# Patient Record
Sex: Male | Born: 1960
Health system: Southern US, Community
[De-identification: ages and names within clinical notes are randomized; demographics above are authoritative.]

## PROBLEM LIST (undated history)

## (undated) ENCOUNTER — Emergency Department (HOSPITAL_COMMUNITY): Admission: EM | Discharge status: Absent without leave | Payer: PPO

## (undated) DIAGNOSIS — F209 Schizophrenia, unspecified: Secondary | ICD-10-CM

---

## 1997-11-24 ENCOUNTER — Emergency Department (HOSPITAL_COMMUNITY): Admission: EM | Admit: 1997-11-24 | Discharge: 1997-11-24 | Payer: Self-pay | Admitting: *Deleted

## 1997-12-08 ENCOUNTER — Inpatient Hospital Stay (HOSPITAL_COMMUNITY): Admission: AD | Admit: 1997-12-08 | Discharge: 1997-12-12 | Payer: Self-pay | Admitting: Specialist

## 1997-12-13 ENCOUNTER — Encounter (HOSPITAL_COMMUNITY): Admission: RE | Admit: 1997-12-13 | Discharge: 1998-03-13 | Payer: Self-pay | Admitting: Specialist

## 1998-07-18 ENCOUNTER — Inpatient Hospital Stay (HOSPITAL_COMMUNITY): Admission: AD | Admit: 1998-07-18 | Discharge: 1998-07-24 | Payer: Self-pay | Admitting: Psychiatry

## 1998-07-31 ENCOUNTER — Inpatient Hospital Stay (HOSPITAL_COMMUNITY): Admission: EM | Admit: 1998-07-31 | Discharge: 1998-08-05 | Payer: Self-pay | Admitting: Emergency Medicine

## 1998-08-04 ENCOUNTER — Encounter: Payer: Self-pay | Admitting: Specialist

## 1998-08-06 ENCOUNTER — Encounter (HOSPITAL_COMMUNITY): Admission: RE | Admit: 1998-08-06 | Discharge: 1998-11-04 | Payer: Self-pay | Admitting: Psychiatry

## 1998-08-10 ENCOUNTER — Emergency Department (HOSPITAL_COMMUNITY): Admission: EM | Admit: 1998-08-10 | Discharge: 1998-08-10 | Payer: Self-pay | Admitting: Emergency Medicine

## 2000-04-03 ENCOUNTER — Emergency Department (HOSPITAL_COMMUNITY): Admission: EM | Admit: 2000-04-03 | Discharge: 2000-04-03 | Payer: Self-pay | Admitting: Emergency Medicine

## 2003-02-01 ENCOUNTER — Ambulatory Visit (HOSPITAL_BASED_OUTPATIENT_CLINIC_OR_DEPARTMENT_OTHER): Admission: RE | Admit: 2003-02-01 | Discharge: 2003-02-01 | Payer: Self-pay | Admitting: Orthopedic Surgery

## 2007-11-24 ENCOUNTER — Emergency Department (HOSPITAL_COMMUNITY): Admission: EM | Admit: 2007-11-24 | Discharge: 2007-11-24 | Payer: Self-pay | Admitting: Emergency Medicine

## 2007-12-07 ENCOUNTER — Emergency Department (HOSPITAL_COMMUNITY): Admission: EM | Admit: 2007-12-07 | Discharge: 2007-12-07 | Payer: Self-pay | Admitting: Emergency Medicine

## 2008-11-13 ENCOUNTER — Ambulatory Visit: Payer: Self-pay | Admitting: Family Medicine

## 2008-11-13 DIAGNOSIS — M25579 Pain in unspecified ankle and joints of unspecified foot: Secondary | ICD-10-CM

## 2008-12-18 ENCOUNTER — Ambulatory Visit: Payer: Self-pay | Admitting: Family Medicine

## 2009-05-10 ENCOUNTER — Emergency Department (HOSPITAL_COMMUNITY): Admission: EM | Admit: 2009-05-10 | Discharge: 2009-05-16 | Payer: Self-pay | Admitting: Emergency Medicine

## 2010-07-13 ENCOUNTER — Emergency Department (HOSPITAL_COMMUNITY)
Admission: EM | Admit: 2010-07-13 | Discharge: 2010-07-14 | Disposition: A | Discharge status: Other Acute Inpatient Hospital | Payer: Self-pay | Source: Home / Self Care | Admitting: Emergency Medicine

## 2010-07-14 ENCOUNTER — Inpatient Hospital Stay (HOSPITAL_COMMUNITY)
Admission: AD | Admit: 2010-07-14 | Discharge: 2010-07-25 | Payer: Self-pay | Attending: Psychiatry | Admitting: Psychiatry

## 2010-07-29 LAB — VALPROIC ACID LEVEL: Valproic Acid Lvl: 88.6 ug/mL (ref 50.0–100.0)

## 2010-08-08 ENCOUNTER — Emergency Department (HOSPITAL_COMMUNITY)
Admission: EM | Admit: 2010-08-08 | Discharge: 2010-08-08 | Payer: Self-pay | Source: Home / Self Care | Admitting: Emergency Medicine

## 2010-09-09 ENCOUNTER — Ambulatory Visit: Payer: Self-pay | Admitting: Family Medicine

## 2010-09-23 LAB — RAPID URINE DRUG SCREEN, HOSP PERFORMED
Amphetamines: NOT DETECTED
Barbiturates: NOT DETECTED
Benzodiazepines: NOT DETECTED
Cocaine: NOT DETECTED
Opiates: NOT DETECTED
Tetrahydrocannabinol: NOT DETECTED

## 2010-09-23 LAB — CBC
HCT: 44.7 % (ref 39.0–52.0)
Hemoglobin: 15.7 g/dL (ref 13.0–17.0)
MCH: 31.8 pg (ref 26.0–34.0)
MCHC: 35.1 g/dL (ref 30.0–36.0)
MCV: 90.7 fL (ref 78.0–100.0)
Platelets: 272 10*3/uL (ref 150–400)
RBC: 4.93 MIL/uL (ref 4.22–5.81)
RDW: 13.4 % (ref 11.5–15.5)
WBC: 8.7 10*3/uL (ref 4.0–10.5)

## 2010-09-23 LAB — BASIC METABOLIC PANEL
BUN: 11 mg/dL (ref 6–23)
CO2: 25 mEq/L (ref 19–32)
Calcium: 9.4 mg/dL (ref 8.4–10.5)
Chloride: 104 mEq/L (ref 96–112)
Creatinine, Ser: 1.07 mg/dL (ref 0.4–1.5)
GFR calc Af Amer: >60 mL/min (ref >60)
GFR calc non Af Amer: >60 mL/min (ref >60)
Glucose, Bld: 118 mg/dL — ABNORMAL HIGH (ref 70–99)
Potassium: 4.1 mEq/L (ref 3.5–5.1)
Sodium: 139 mEq/L (ref 135–145)

## 2010-09-23 LAB — DIFFERENTIAL
Basophils Absolute: 0 10*3/uL (ref 0.0–0.1)
Basophils Relative: 0 % (ref 0–1)
Eosinophils Absolute: 0 10*3/uL (ref 0.0–0.7)
Eosinophils Relative: 1 % (ref 0–5)
Lymphocytes Relative: 32 % (ref 12–46)
Lymphs Abs: 2.8 10*3/uL (ref 0.7–4.0)
Monocytes Absolute: 0.7 10*3/uL (ref 0.1–1.0)
Monocytes Relative: 9 % (ref 3–12)
Neutro Abs: 5.2 10*3/uL (ref 1.7–7.7)
Neutrophils Relative %: 59 % (ref 43–77)

## 2010-09-23 LAB — HEPATIC FUNCTION PANEL
ALT: 35 U/L (ref 0–53)
AST: 22 U/L (ref 0–37)
Albumin: 3.7 g/dL (ref 3.5–5.2)
Alkaline Phosphatase: 60 U/L (ref 39–117)
Bilirubin, Direct: <0.1 mg/dL (ref 0.0–0.3)
Total Bilirubin: 0.5 mg/dL (ref 0.3–1.2)
Total Protein: 6.8 g/dL (ref 6.0–8.3)

## 2010-09-23 LAB — TSH: TSH: 6.293 u[IU]/mL — ABNORMAL HIGH (ref 0.350–4.500)

## 2010-09-23 LAB — ETHANOL: Alcohol, Ethyl (B): <5 mg/dL (ref 0–10)

## 2010-10-17 LAB — COMPREHENSIVE METABOLIC PANEL
ALT: 22 U/L (ref 0–53)
AST: 28 U/L (ref 0–37)
Albumin: 3.6 g/dL (ref 3.5–5.2)
Alkaline Phosphatase: 52 U/L (ref 39–117)
BUN: 14 mg/dL (ref 6–23)
CO2: 25 mEq/L (ref 19–32)
Calcium: 9.1 mg/dL (ref 8.4–10.5)
Chloride: 105 mEq/L (ref 96–112)
Creatinine, Ser: 0.99 mg/dL (ref 0.4–1.5)
GFR calc Af Amer: >60 mL/min (ref >60)
GFR calc non Af Amer: >60 mL/min (ref >60)
Glucose, Bld: 125 mg/dL — ABNORMAL HIGH (ref 70–99)
Potassium: 3.7 mEq/L (ref 3.5–5.1)
Sodium: 140 mEq/L (ref 135–145)
Total Bilirubin: 0.9 mg/dL (ref 0.3–1.2)
Total Protein: 6.3 g/dL (ref 6.0–8.3)

## 2010-10-17 LAB — URINALYSIS, ROUTINE W REFLEX MICROSCOPIC
Bilirubin Urine: NEGATIVE
Glucose, UA: NEGATIVE mg/dL
Hgb urine dipstick: NEGATIVE
Ketones, ur: 15 mg/dL — AB
Nitrite: NEGATIVE
Protein, ur: NEGATIVE mg/dL
Specific Gravity, Urine: 1.023 (ref 1.005–1.030)
Urobilinogen, UA: 1 mg/dL (ref 0.0–1.0)
pH: 6.5 (ref 5.0–8.0)

## 2010-10-17 LAB — LIPASE, BLOOD: Lipase: 15 U/L (ref 11–59)

## 2010-10-17 LAB — TYPE AND SCREEN
ABO/RH(D): O POS
Antibody Screen: NEGATIVE

## 2010-10-17 LAB — DIFFERENTIAL
Basophils Absolute: 0 10*3/uL (ref 0.0–0.1)
Basophils Relative: 0 % (ref 0–1)
Eosinophils Absolute: 0 10*3/uL (ref 0.0–0.7)
Eosinophils Relative: 0 % (ref 0–5)
Lymphocytes Relative: 27 % (ref 12–46)
Lymphs Abs: 3.1 10*3/uL (ref 0.7–4.0)
Monocytes Absolute: 0.8 10*3/uL (ref 0.1–1.0)
Monocytes Relative: 7 % (ref 3–12)
Neutro Abs: 7.4 10*3/uL (ref 1.7–7.7)
Neutrophils Relative %: 66 % (ref 43–77)

## 2010-10-17 LAB — ETHANOL: Alcohol, Ethyl (B): <5 mg/dL (ref 0–10)

## 2010-10-17 LAB — SALICYLATE LEVEL: Salicylate Lvl: <4 mg/dL (ref 2.8–20.0)

## 2010-10-17 LAB — RAPID URINE DRUG SCREEN, HOSP PERFORMED
Amphetamines: NOT DETECTED
Barbiturates: NOT DETECTED
Opiates: NOT DETECTED

## 2010-10-17 LAB — CBC
HCT: 43.2 % (ref 39.0–52.0)
Hemoglobin: 14.6 g/dL (ref 13.0–17.0)
MCHC: 33.8 g/dL (ref 30.0–36.0)
MCV: 94.8 fL (ref 78.0–100.0)
Platelets: 222 10*3/uL (ref 150–400)
RBC: 4.56 MIL/uL (ref 4.22–5.81)
RDW: 13 % (ref 11.5–15.5)
WBC: 11.3 10*3/uL — ABNORMAL HIGH (ref 4.0–10.5)

## 2010-10-17 LAB — ABO/RH: ABO/RH(D): O POS

## 2010-10-17 LAB — ACETAMINOPHEN LEVEL: Acetaminophen (Tylenol), Serum: <10 ug/mL — ABNORMAL LOW (ref 10–30)

## 2010-11-29 NOTE — Op Note (Signed)
   NAME:  Anthony Knox, Anthony Knox                           ACCOUNT NO.:  [id number]   MEDICAL RECORD NO.:  [id number]                   PATIENT TYPE:  AMB   LOCATION:  DSC                                  FACILITY:  [facility]   PHYSICIAN:  Cindee Salt, M.D.                    DATE OF BIRTH:  [birth year]   DATE OF PROCEDURE:  02/01/2003  DATE OF DISCHARGE:                                 OPERATIVE REPORT   PREOPERATIVE DIAGNOSIS:  Stenosing tenosynovitis, right middle finger.   POSTOPERATIVE DIAGNOSIS:  Stenosing tenosynovitis, right middle finger.   OPERATION:  Release of A1 pulley, right middle finger.   SURGEON:  Cindee Salt, M.D.   ASSISTANTCarolyne Fiscal.   ANESTHESIA:  Forearm based IV regional.   HISTORY:  The patient is a 50 year old male with a history of triggering of  his right middle finger which has not responded to conservative treatment.   DESCRIPTION OF PROCEDURE:  The patient was brought to the operating room.  A  forearm based IV regional anesthetic was carried out without difficulty.  He  was prepped and draped using Duraprep in the supine position with the right  arm free.  An oblique incision was made over the A1 pulley and carried  through the subcutaneous tissue.  The neurovascular structures were  identified and protected.  The A1 pulley was released on its radial aspect.  The A2 pulley was incised in the central aspect for approximately 2-3 mm.  The finger was placed through a full range of motion.  No further triggering  was identified.  The wound was irrigated.  The skin was closed with  interrupted 5-0 nylon sutures.  A sterile compressive dressing was applied.  The patient tolerated the procedure well and was taken to the recovery room  for observation in satisfactory condition.  He is discharged home to return  to the [facility] [place] in one week on Vicodin and Keflex.                                               Cindee Salt, M.D.    GK/MEDQ  D:  02/01/2003   T:  02/01/2003  Job:  [id number]

## 2011-04-09 LAB — POCT I-STAT, CHEM 8
Chloride: 104
Creatinine, Ser: 1
Glucose, Bld: 114 — ABNORMAL HIGH
Hemoglobin: 16
Potassium: 3.4 — ABNORMAL LOW
Sodium: 139

## 2011-04-09 LAB — CBC
HCT: 45.5
Hemoglobin: 15.7
MCV: 89.4
Platelets: 290
RBC: 5.09
WBC: 6

## 2011-04-09 LAB — URINALYSIS, ROUTINE W REFLEX MICROSCOPIC
Glucose, UA: NEGATIVE
Hgb urine dipstick: NEGATIVE
Specific Gravity, Urine: 1.034 — ABNORMAL HIGH
pH: 6.5

## 2011-04-09 LAB — URINE MICROSCOPIC-ADD ON

## 2011-04-09 LAB — RAPID URINE DRUG SCREEN, HOSP PERFORMED: Barbiturates: NOT DETECTED

## 2011-07-25 IMAGING — CR DG CHEST 2V
2 series · 2 of 2 positions shown · non-contrast
Comparison: [DATE] and nine CT.

CLINICAL DATA: 49-year-old male with shortness of breath, cough,
fever, right chest pain.

CHEST - 2 VIEW

[w chest pa]
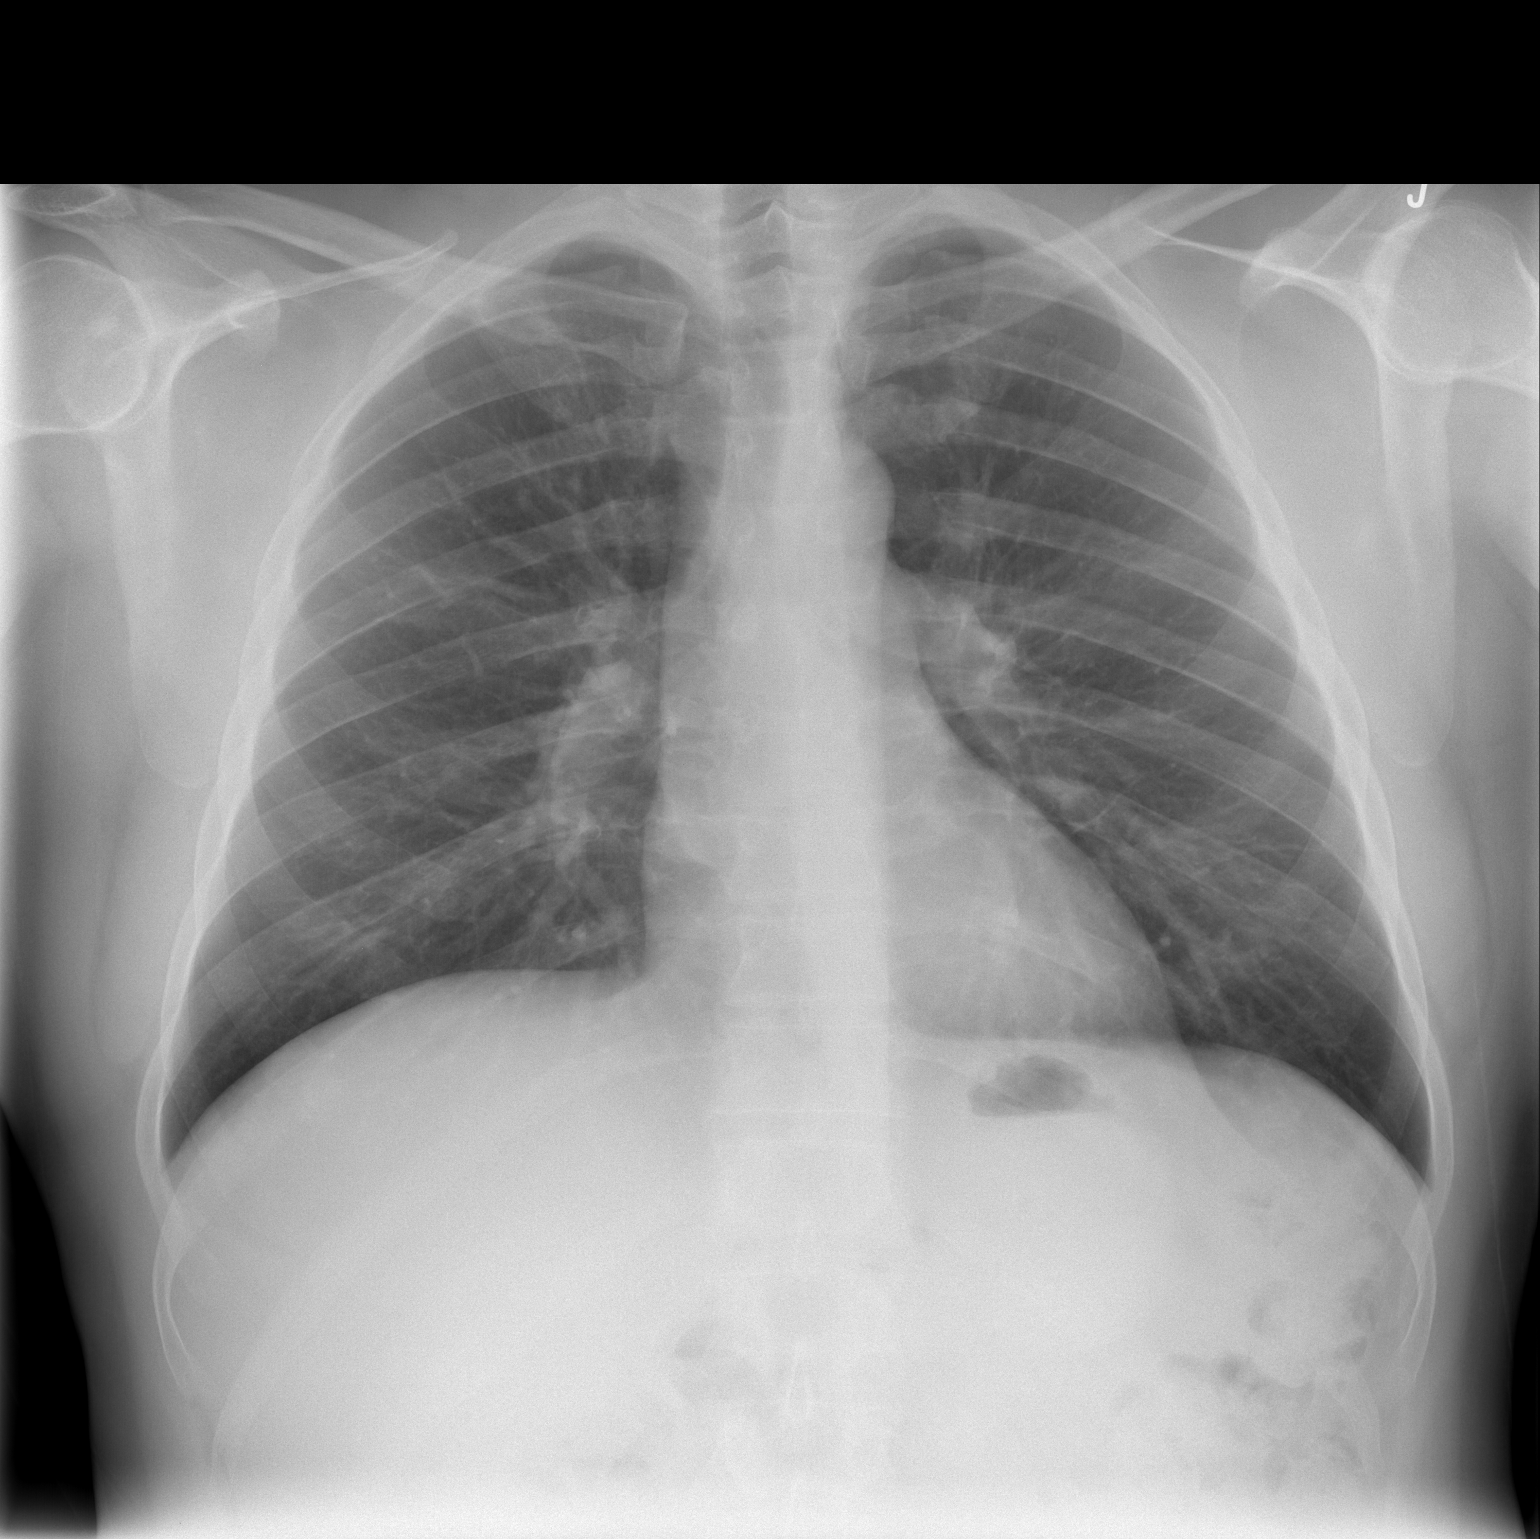

[w chest lat]
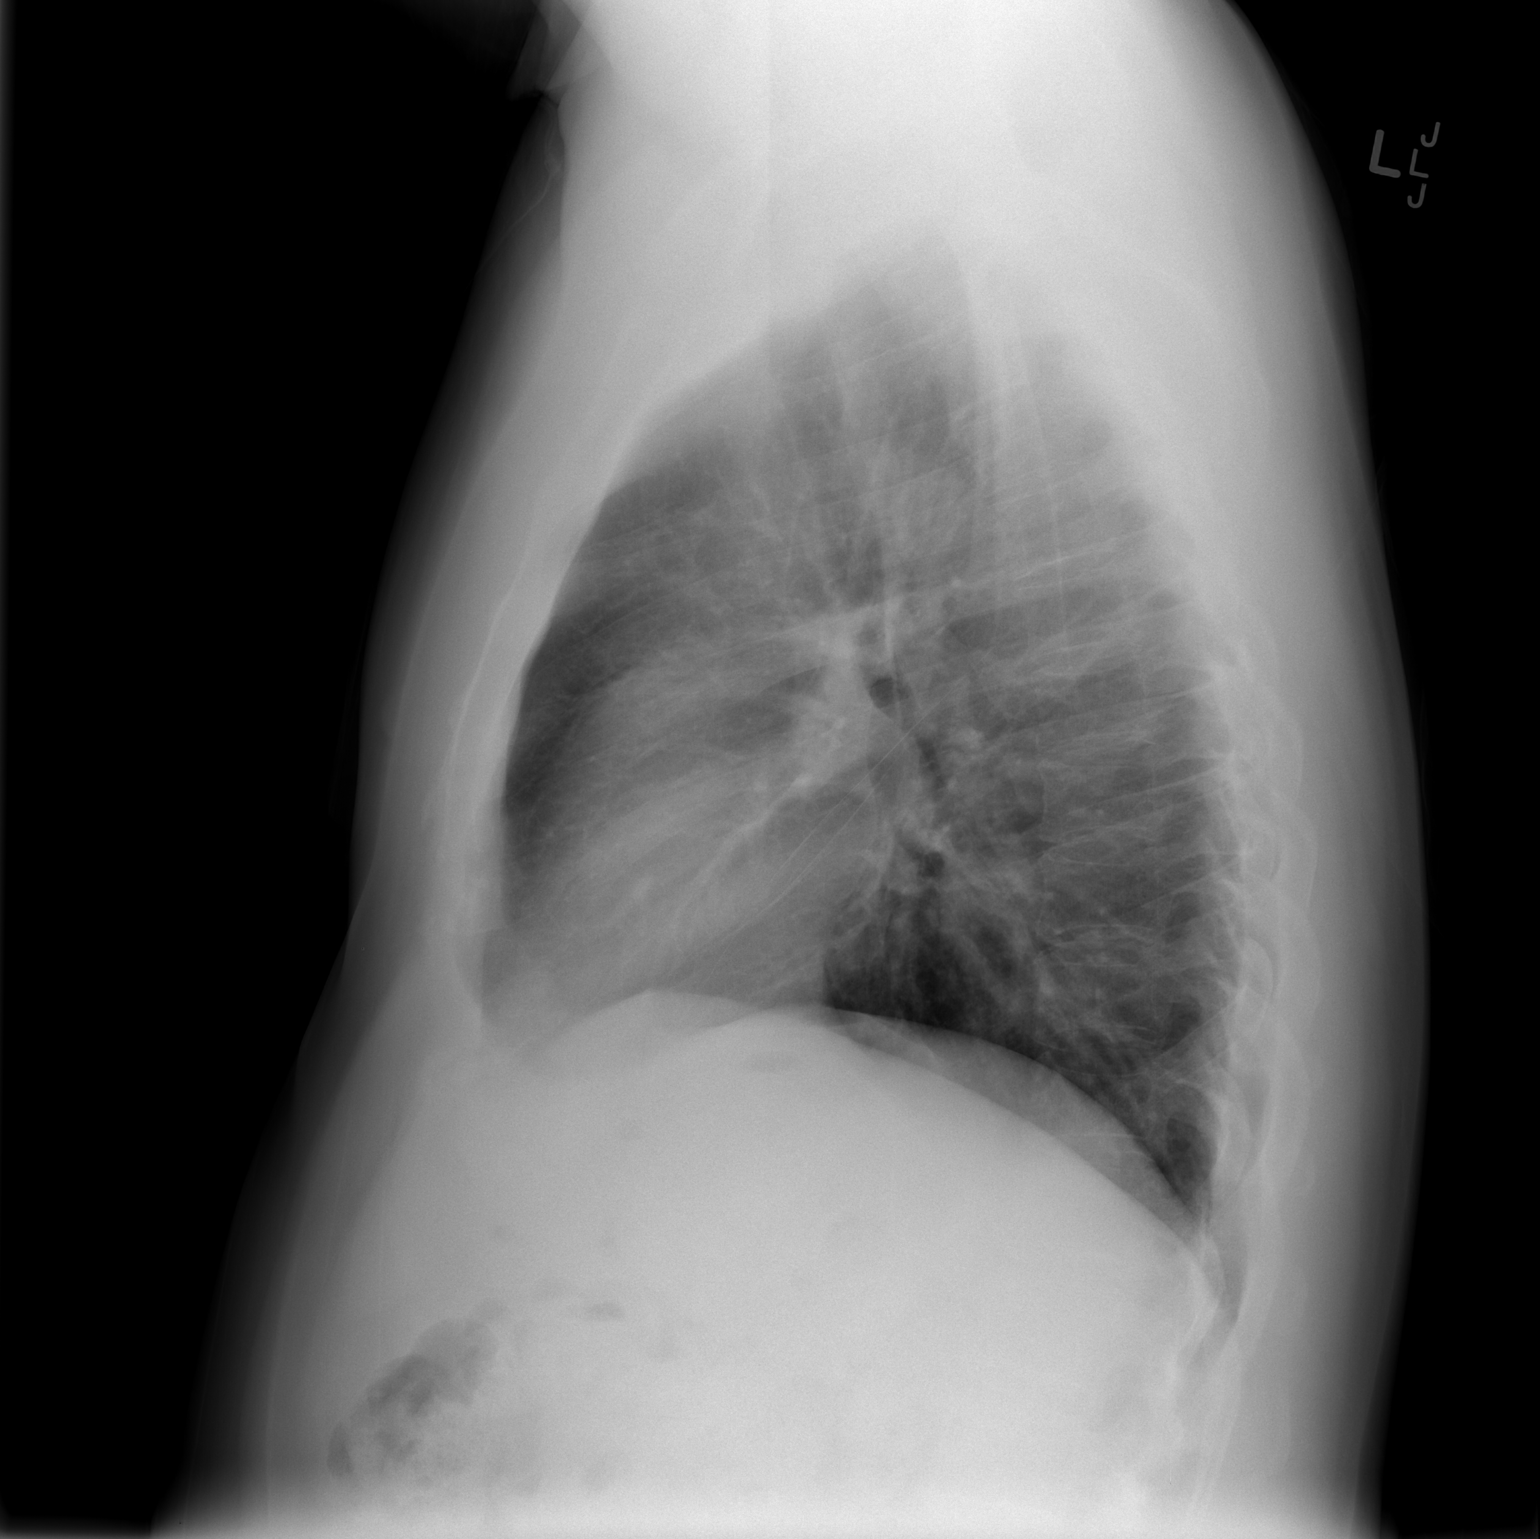

[2 of 2 positions shown; findings below may reference images not displayed]

FINDINGS: Normal lung volumes. Normal cardiac size and mediastinal
contours.  Visualized tracheal air column is within normal limits.
The lungs are clear. No acute osseous abnormality identified.
IMPRESSION: Negative, no acute cardiopulmonary abnormality.

## 2018-05-05 ENCOUNTER — Encounter (HOSPITAL_COMMUNITY): Payer: Self-pay | Admitting: Registered Nurse

## 2018-05-05 ENCOUNTER — Emergency Department (HOSPITAL_COMMUNITY)
Admission: EM | Admit: 2018-05-05 | Discharge: 2018-05-07 | Disposition: A | Discharge status: Other Acute Inpatient Hospital | Payer: Medicare Other | Attending: Emergency Medicine | Admitting: Emergency Medicine

## 2018-05-05 ENCOUNTER — Emergency Department (HOSPITAL_COMMUNITY): Payer: Medicare Other

## 2018-05-05 ENCOUNTER — Other Ambulatory Visit: Payer: Self-pay

## 2018-05-05 DIAGNOSIS — R410 Disorientation, unspecified: Secondary | ICD-10-CM

## 2018-05-05 DIAGNOSIS — W19XXXA Unspecified fall, initial encounter: Secondary | ICD-10-CM | POA: Insufficient documentation

## 2018-05-05 DIAGNOSIS — F29 Unspecified psychosis not due to a substance or known physiological condition: Secondary | ICD-10-CM

## 2018-05-05 DIAGNOSIS — F259 Schizoaffective disorder, unspecified: Secondary | ICD-10-CM | POA: Diagnosis not present

## 2018-05-05 DIAGNOSIS — S0101XA Laceration without foreign body of scalp, initial encounter: Secondary | ICD-10-CM

## 2018-05-05 DIAGNOSIS — Y999 Unspecified external cause status: Secondary | ICD-10-CM | POA: Insufficient documentation

## 2018-05-05 DIAGNOSIS — Z23 Encounter for immunization: Secondary | ICD-10-CM | POA: Insufficient documentation

## 2018-05-05 DIAGNOSIS — Y9389 Activity, other specified: Secondary | ICD-10-CM | POA: Insufficient documentation

## 2018-05-05 DIAGNOSIS — F251 Schizoaffective disorder, depressive type: Secondary | ICD-10-CM | POA: Insufficient documentation

## 2018-05-05 DIAGNOSIS — Y9289 Other specified places as the place of occurrence of the external cause: Secondary | ICD-10-CM | POA: Insufficient documentation

## 2018-05-05 LAB — COMPREHENSIVE METABOLIC PANEL
ALT: 37 U/L (ref 0–44)
ANION GAP: 4 — AB (ref 5–15)
AST: 45 U/L — ABNORMAL HIGH (ref 15–41)
Albumin: 3.7 g/dL (ref 3.5–5.0)
Alkaline Phosphatase: 49 U/L (ref 38–126)
BILIRUBIN TOTAL: 0.8 mg/dL (ref 0.3–1.2)
BUN: 8 mg/dL (ref 6–20)
CALCIUM: 8.5 mg/dL — AB (ref 8.9–10.3)
CO2: 25 mmol/L (ref 22–32)
Chloride: 109 mmol/L (ref 98–111)
Creatinine, Ser: 0.95 mg/dL (ref 0.61–1.24)
Glucose, Bld: 104 mg/dL — ABNORMAL HIGH (ref 70–99)
Potassium: 3.8 mmol/L (ref 3.5–5.1)
Sodium: 138 mmol/L (ref 135–145)
TOTAL PROTEIN: 6 g/dL — AB (ref 6.5–8.1)

## 2018-05-05 LAB — CBC
HCT: 45.5 % (ref 39.0–52.0)
Hemoglobin: 15 g/dL (ref 13.0–17.0)
MCH: 29.5 pg (ref 26.0–34.0)
MCHC: 33 g/dL (ref 30.0–36.0)
MCV: 89.6 fL (ref 80.0–100.0)
NRBC: 0 % (ref 0.0–0.2)
PLATELETS: 279 10*3/uL (ref 150–400)
RBC: 5.08 MIL/uL (ref 4.22–5.81)
RDW: 13.6 % (ref 11.5–15.5)
WBC: 8 10*3/uL (ref 4.0–10.5)

## 2018-05-05 LAB — ETHANOL

## 2018-05-05 LAB — SALICYLATE LEVEL: Salicylate Lvl: <7 mg/dL (ref 2.8–30.0)

## 2018-05-05 LAB — ACETAMINOPHEN LEVEL

## 2018-05-05 MED ORDER — TETANUS-DIPHTH-ACELL PERTUSSIS 5-2.5-18.5 LF-MCG/0.5 IM SUSP
0.5000 mL | Freq: Once | INTRAMUSCULAR | Status: AC
  Administered 2018-05-05: 0.5 mL via INTRAMUSCULAR
  Filled 2018-05-05: qty 0.5

## 2018-05-05 MED ORDER — ZIPRASIDONE MESYLATE 20 MG IM SOLR: 20.0000 mg | INTRAMUSCULAR | Status: DC | PRN

## 2018-05-05 MED ORDER — RISPERIDONE 2 MG PO TBDP
2.0000 mg | ORAL_TABLET | Freq: Three times a day (TID) | ORAL | Status: DC | PRN
  Filled 2018-05-05: qty 1

## 2018-05-05 MED ORDER — LIDOCAINE-EPINEPHRINE (PF) 2 %-1:200000 IJ SOLN
10.0000 mL | Freq: Once | INTRAMUSCULAR | Status: DC
  Filled 2018-05-05: qty 20

## 2018-05-05 MED ORDER — LIDOCAINE-EPINEPHRINE-TETRACAINE (LET) SOLUTION
3.0000 mL | Freq: Once | NASAL | Status: AC
  Administered 2018-05-05: 3 mL via TOPICAL
  Filled 2018-05-05: qty 3

## 2018-05-05 MED ORDER — LORAZEPAM 1 MG PO TABS: 1.0000 mg | ORAL_TABLET | ORAL | Status: DC | PRN
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
## 2018-05-05 NOTE — ED Notes (Signed)
Patient given a urinal and asked to give urine sample; pt put water in urinal and brought to RN; when asked about it patient states "I discharged it" and laughed out loud; Patient ambulated back to room and return to bed-Monique,RN

## 2018-05-05 NOTE — ED Notes (Signed)
Pt wanded by security at 15:12.

## 2018-05-05 NOTE — ED Notes (Addendum)
Pt was found wandering in supply room with the lights turned off. Pt easily redirected back to hallway bed, calm and cooperative. RN aware.

## 2018-05-05 NOTE — ED Notes (Signed)
Pt car is at [employer], [employer] by [employer]

## 2018-05-05 NOTE — ED Notes (Signed)
Pt changed into purple scrubs 
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
## 2018-05-05 NOTE — ED Notes (Signed)
Pt belongings in locker #1. 

## 2018-05-06 ENCOUNTER — Encounter (HOSPITAL_COMMUNITY): Payer: Self-pay | Admitting: Registered Nurse

## 2018-05-06 DIAGNOSIS — R410 Disorientation, unspecified: Secondary | ICD-10-CM | POA: Insufficient documentation

## 2018-05-06 DIAGNOSIS — S0101XA Laceration without foreign body of scalp, initial encounter: Secondary | ICD-10-CM | POA: Insufficient documentation

## 2018-05-06 DIAGNOSIS — F29 Unspecified psychosis not due to a substance or known physiological condition: Secondary | ICD-10-CM

## 2018-05-06 DIAGNOSIS — F203 Undifferentiated schizophrenia: Secondary | ICD-10-CM

## 2018-05-06 LAB — RAPID URINE DRUG SCREEN, HOSP PERFORMED
Amphetamines: NOT DETECTED
BARBITURATES: NOT DETECTED
Benzodiazepines: NOT DETECTED
COCAINE: NOT DETECTED
Opiates: NOT DETECTED
TETRAHYDROCANNABINOL: NOT DETECTED

## 2018-05-06 MED ORDER — OLANZAPINE 5 MG PO TBDP
5.0000 mg | ORAL_TABLET | Freq: Two times a day (BID) | ORAL | Status: DC
  Administered 2018-05-06 – 2018-05-07 (×3): 5 mg via ORAL
  Filled 2018-05-06 (×3): qty 1

## 2018-05-06 MED ORDER — LORAZEPAM 1 MG PO TABS
1.0000 mg | ORAL_TABLET | Freq: Once | ORAL | Status: AC | PRN
  Administered 2018-05-06: 1 mg via ORAL
  Filled 2018-05-06: qty 1

## 2018-05-06 MED ORDER — ZIPRASIDONE MESYLATE 20 MG IM SOLR: 20.0000 mg | Freq: Once | INTRAMUSCULAR | Status: DC | PRN

## 2018-05-06 NOTE — ED Notes (Signed)
Pt up stating he needs to get dressed. Pt verbally redirected- used restroom- now back in room.

## 2018-05-06 NOTE — ED Notes (Signed)
Patient at the RN station attempting to call his brother; patient could not state correct number to call family; Patient remains confused at this time-Monique,RN

## 2018-05-06 NOTE — Progress Notes (Signed)
Pt accepted to [facility] Bed 505-2 for admission on 05/07/18 when bed is available. Nelly Rout, MD, is the accepting provider.  Jolyne Loa, MD. is the attending provider.  Call report to [phone]  [email or web address] ED notified.   Pt is TO BE IVC'D as he lacks capacity to sign in Voluntarily due to his psychosis  Pt may be transported by [facility] Pt scheduled  to arrive at [facility] WHEN AC CALLS TO NOTIFY THAT BED IS AVAILABLE.  Timmothy Euler. Kaylyn Lim, MSW, LCSWA Disposition Clinical Social Work [phone] (cell) [phone] (office)

## 2018-05-06 NOTE — ED Notes (Addendum)
EDP at bedside and IVC paperwork completed and faxed.

## 2018-05-06 NOTE — ED Notes (Signed)
Patient has been standing at desk talking to sitter and RN; RN has been have general conversation with patient about his family; patient does not appear to know anything about his life except he is able to speak about his father Anthony Knox and  His brother Chapman Moss. 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 Patient walked freely back to the unit; Patient in his room calm at this time and asked to take a shower; patient was helped to be able to take shower-Monique,RN

## 2018-05-06 NOTE — ED Notes (Signed)
Dinner tray ordered for pt

## 2018-05-06 NOTE — ED Notes (Signed)
Patient has come out the room on several occasions throughout the night with random thoughts and conversations; patient most recent states he thinks he had an epiphany that he has cancer; pt requested to have MD order test so he would not get billed twice; patient is still very much disoriented but able to be redirected back to his room-Monique,RN

## 2018-05-06 NOTE — ED Notes (Signed)
RN asked pt his name. He states "scumbag" and laughed. Rn asked if he knows where he is. He states "I am in a hospital and I am just a number. I am trying to use reverse psychology to get you to laugh... But I really do feel bad." He did a few laps up and down hall of unit. Sprite given.

## 2018-05-06 NOTE — ED Notes (Signed)
Lunch tray ordered 

## 2018-05-06 NOTE — Consult Note (Signed)
Telepsych Consultation   Reason for Consult:  Altered mental status Referring Physician:  Ollen Gross Location of Patient: [facility] Location of Provider: [facility]  Patient Identification: Anthony Knox MRN:  [id number] Principal Diagnosis: Schizophrenia [facility]) Diagnosis:   Patient Active Problem List   Diagnosis Date Noted  . Schizophrenia ([place]) [F20.9] 05/06/2018  . ANKLE PAIN [M25.579] 11/13/2008    Total Time spent with patient: 30 minutes  Subjective:   Anthony Knox is a 57 y.o. male patient presented to [facility] via EMS after he was found in [place] parking lot sitting in his car disoriented with a cut on head.  "Thinking he may have fallen at home.   HPI:  Anthony Knox, 57 y.o., male patient seen via telepsych by this provider and Dr. Dwyane Dee; chart reviewed on 05/06/18.  On evaluation Anthony Knox reports that his name is Anthony Knox and he was born on [birth year]; to get his age "move the decimal 2 places to the left"  Patient reports that he woke up yesterday "in a pool of blood; felt like I had been shot in the head.  Got up and got dressed was going to one of those 24 hour emergency places.  I went to the wrong address.  I started to feel really dizzy."  Patient states that he lives with his mother "Wells Guiles" but he doesn't know her phone number but states they live [address].  When asked about auditory or visual hallucination patient responded "I don't know what other people see.  I am ashamed and really shy.  I don't like talking to people about certain things.  They make you feel weird and ashamed; then want to chain you to a bed"  Patient states that he has seen a psychiatrist in the past "he was broken and was a nut; he didn't even know what logic was.  Patient denies suicidal/self-harm/homicidal ideation, and paranoia.  Patient did make statement about seeing thing come out of the ground and shadows but would not elaborated further.   Past  Psychiatric History: Previous diagnosis of schizophrenia 2012 with outpatient services at [facility]; was taking psychotropic medications at that time; patient unable to give time line of when stopped medications and last outpatient psychiatric visit.    Risk to Self: Suicidal Ideation: No Suicidal Intent: No Is patient at risk for suicide?: No Suicidal Plan?: No Access to Means: No What has been your use of drugs/alcohol within the last 12 months?: none How many times?: 0 Other Self Harm Risks: none Triggers for Past Attempts: None known Intentional Self Injurious Behavior: None Risk to Others: Homicidal Ideation: No Thoughts of Harm to Others: No Current Homicidal Intent: No Current Homicidal Plan: No Access to Homicidal Means: No Identified Victim: none History of harm to others?: No Assessment of Violence: None Noted Violent Behavior Description: none Does patient have access to weapons?: No Criminal Charges Pending?: No Does patient have a court date: No Prior Inpatient Therapy: Prior Inpatient Therapy: Yes Prior Therapy Dates: 2012 Prior Therapy Facilty/Provider(s): [facility] Reason for Treatment: scizoaffective disorder Prior Outpatient Therapy: Prior Outpatient Therapy: Yes Prior Therapy Dates: 2015 Prior Therapy Facilty/Provider(s): [facility] Reason for Treatment: schizoaffective disorder Does patient have an ACCT team?: No Does patient have Intensive In-House Services?  : No Does patient have [facility] services? : No Does patient have P4CC services?: No  Past Medical History: History reviewed. No pertinent past medical history. History reviewed. No pertinent surgical  history. Family History: History reviewed. No pertinent family history. Family Psychiatric  History: Unaware Social History:  Social History   Substance and Sexual Activity  Alcohol Use Not on file     Social History   Substance and Sexual Activity  Drug Use Not on file    Social History    Socioeconomic History  . Marital status: Single    Spouse name: Not on file  . Number of children: Not on file  . Years of education: Not on file  . Highest education level: Not on file  Occupational History  . Not on file  Social Needs  . Financial resource strain: Not on file  . Food insecurity:    Worry: Not on file    Inability: Not on file  . Transportation needs:    Medical: Not on file    Non-medical: Not on file  Tobacco Use  . Smoking status: Not on file  Substance and Sexual Activity  . Alcohol use: Not on file  . Drug use: Not on file  . Sexual activity: Not on file  Lifestyle  . Physical activity:    Days per week: Not on file    Minutes per session: Not on file  . Stress: Not on file  Relationships  . Social connections:    Talks on phone: Not on file    Gets together: Not on file    Attends religious service: Not on file    Active member of club or organization: Not on file    Attends meetings of clubs or organizations: Not on file    Relationship status: Not on file  Other Topics Concern  . Not on file  Social History Narrative  . Not on file   Additional Social History:    Allergies:  No Known Allergies  Labs:  Results for orders placed or performed during the hospital encounter of 05/05/18 (from the past 48 hour(s))  Urine rapid drug screen (hosp performed)     Status: None   Collection Time: 05/05/18 10:47 AM  Result Value Ref Range   Opiates NONE DETECTED NONE DETECTED   Cocaine NONE DETECTED NONE DETECTED   Benzodiazepines NONE DETECTED NONE DETECTED   Amphetamines NONE DETECTED NONE DETECTED   Tetrahydrocannabinol NONE DETECTED NONE DETECTED   Barbiturates NONE DETECTED NONE DETECTED    Comment: (NOTE) DRUG SCREEN FOR MEDICAL PURPOSES ONLY.  IF CONFIRMATION IS NEEDED FOR ANY PURPOSE, NOTIFY LAB WITHIN 5 DAYS. LOWEST DETECTABLE LIMITS FOR URINE DRUG SCREEN Drug Class                     Cutoff (ng/mL) Amphetamine and metabolites     1000 Barbiturate and metabolites    200 Benzodiazepine                 795 Tricyclics and metabolites     300 Opiates and metabolites        300 Cocaine and metabolites        300 THC                            50 Performed at [facility], [place] [address] [place], [place] [address]   Comprehensive metabolic panel     Status: Abnormal   Collection Time: 05/05/18 12:00 PM  Result Value Ref Range   Sodium 138 135 - 145 mmol/L   Potassium 3.8 3.5 - 5.1 mmol/L  Chloride 109 98 - 111 mmol/L   CO2 25 22 - 32 mmol/L   Glucose, Bld 104 (H) 70 - 99 mg/dL   BUN 8 6 - 20 mg/dL   Creatinine, Ser 0.95 0.61 - 1.24 mg/dL   Calcium 8.5 (L) 8.9 - 10.3 mg/dL   Total Protein 6.0 (L) 6.5 - 8.1 g/dL   Albumin 3.7 3.5 - 5.0 g/dL   AST 45 (H) 15 - 41 U/L   ALT 37 0 - 44 U/L   Alkaline Phosphatase 49 38 - 126 U/L   Total Bilirubin 0.8 0.3 - 1.2 mg/dL   GFR calc non Af Amer >60 >60 mL/min   GFR calc Af Amer >60 >60 mL/min    Comment: (NOTE) The eGFR has been calculated using the CKD EPI equation. This calculation has not been validated in all clinical situations. eGFR's persistently <60 mL/min signify possible Chronic Kidney Disease.    Anion gap 4 (L) 5 - 15    Comment: Performed at [place] [address], [place] [address]  CBC     Status: None   Collection Time: 05/05/18 12:00 PM  Result Value Ref Range   WBC 8.0 4.0 - 10.5 K/uL   RBC 5.08 4.22 - 5.81 MIL/uL   Hemoglobin 15.0 13.0 - 17.0 g/dL   HCT 45.5 39.0 - 52.0 %   MCV 89.6 80.0 - 100.0 fL   MCH 29.5 26.0 - 34.0 pg   MCHC 33.0 30.0 - 36.0 g/dL   RDW 13.6 11.5 - 15.5 %   Platelets 279 150 - 400 K/uL   nRBC 0.0 0.0 - 0.2 %    Comment: Performed at [facility], [place] [address], [place], [place] [address]  Ethanol     Status: None   Collection Time: 05/05/18 12:00 PM  Result Value Ref Range   Alcohol, Ethyl (B) <10 <10 mg/dL    Comment: (NOTE) Lowest detectable limit for serum alcohol is 10  mg/dL. For medical purposes only. Performed at [facility], [place] [address] [place], [place] [address]   Salicylate level     Status: None   Collection Time: 05/05/18 12:00 PM  Result Value Ref Range   Salicylate Lvl <1.0 2.8 - 30.0 mg/dL    Comment: Performed at [place] [address] [place], [place] [address]  Acetaminophen level     Status: Abnormal   Collection Time: 05/05/18 12:00 PM  Result Value Ref Range   Acetaminophen (Tylenol), Serum <10 (L) 10 - 30 ug/mL    Comment: (NOTE) Therapeutic concentrations vary significantly. A range of 10-30 ug/mL  may be an effective concentration for many patients. However, some  are best treated at concentrations outside of this range. Acetaminophen concentrations >150 ug/mL at 4 hours after ingestion  and >50 ug/mL at 12 hours after ingestion are often associated with  toxic reactions. Performed at [facility], [place] [address], [place], [place] [address]     Medications:  Current Facility-Administered Medications  Medication Dose Route Frequency Provider Last Rate Last Dose  . lidocaine-EPINEPHrine (XYLOCAINE W/EPI) 2 %-1:200000 (PF) injection 10 mL  10 mL Infiltration Once Lorin Glass, PA-C      . ziprasidone (GEODON) injection 20 mg  20 mg Intramuscular Once PRN Rankin, Shuvon B, NP       And  . LORazepam (ATIVAN) tablet 1 mg  1 mg Oral Once PRN Rankin, Shuvon B, NP      .  OLANZapine zydis (ZYPREXA) disintegrating tablet 5 mg  5 mg Oral BID Rankin, Shuvon B, NP       No current outpatient medications on file.    Musculoskeletal: Strength & Muscle Tone: within normal limits Gait & Station: normal Patient leans: N/A  Psychiatric Specialty Exam: Physical Exam  ROS  Blood pressure 138/85, pulse 78, temperature 98.5 F (36.9 C), temperature source Oral, resp. rate 19, height 5' 7" (1.702 m), weight 81.6 kg, SpO2 95 %.Body mass index is 28.19 kg/m.  General Appearance: Casual  Eye Contact:   Good  Speech:  Clear and Coherent and Normal Rate  Volume:  Normal  Mood:  appropriate  Affect:  Congruent  Thought Process:  Disorganized  Orientation:  Other:  to place  Thought Content:  Delusions, Hallucinations: Visual and Rumination  Suicidal Thoughts:  No  Homicidal Thoughts:  No  Memory:  Immediate;   Poor Recent;   Poor Remote;   Poor  Judgement:  Impaired  Insight:  Lacking  Psychomotor Activity:  Normal  Concentration:  Concentration: Fair and Attention Span: Fair  Recall:  Poor  Fund of Knowledge:  Fair  Language:  Good  Akathisia:  No  Handed:  Right  AIMS (if indicated):     Assets:  Communication Skills Housing  ADL's:  Intact  Cognition:  WNL  Sleep:        Treatment Plan Summary: Daily contact with patient to assess and evaluate symptoms and progress in treatment, Medication management and Plan Inpatient psychiatric treatment   Recommendation: Start Zyprexa 5 mg Bid; Change current agitation order of Geodon/Ativan/Risperdal to Geodon/Ativan prn agitation. EKG related to on medication that can cause QT prolongation.  Orders entered   Disposition: Recommend psychiatric Inpatient admission when medically cleared.  This service was provided via telemedicine using a 2-way, interactive audio and video technology.  Names of all persons participating in this telemedicine service and their role in this encounter. Name: Earleen Newport, NP Role: Tele psych assessment  Name: Dr. Dwyane Dee Role: Psychiatrist  Name: Arnette Felts Role: Patient  Name: Dr Billy Fischer Role: Informed of above recommendation and disposition.      Shuvon Rankin, NP 05/06/2018 10:44 AM

## 2018-05-06 NOTE — ED Notes (Signed)
Pt showering at this time. Sheets changed- fresh scrubs given.

## 2018-05-06 NOTE — ED Notes (Signed)
TTS at this time. 

## 2018-05-07 ENCOUNTER — Other Ambulatory Visit: Payer: Self-pay

## 2018-05-07 ENCOUNTER — Inpatient Hospital Stay (HOSPITAL_COMMUNITY)
Admission: AD | Admit: 2018-05-07 | Discharge: 2018-05-14 | DRG: 885 | Disposition: A | Discharge status: Home / Self Care | Payer: Medicare Other | Source: Intra-hospital | Attending: Psychiatry | Admitting: Psychiatry

## 2018-05-07 DIAGNOSIS — Z720 Tobacco use: Secondary | ICD-10-CM

## 2018-05-07 DIAGNOSIS — F250 Schizoaffective disorder, bipolar type: Secondary | ICD-10-CM | POA: Diagnosis not present

## 2018-05-07 DIAGNOSIS — F419 Anxiety disorder, unspecified: Secondary | ICD-10-CM | POA: Diagnosis present

## 2018-05-07 DIAGNOSIS — R451 Restlessness and agitation: Secondary | ICD-10-CM | POA: Diagnosis present

## 2018-05-07 DIAGNOSIS — G4700 Insomnia, unspecified: Secondary | ICD-10-CM | POA: Diagnosis present

## 2018-05-07 DIAGNOSIS — Z23 Encounter for immunization: Secondary | ICD-10-CM

## 2018-05-07 DIAGNOSIS — W19XXXA Unspecified fall, initial encounter: Secondary | ICD-10-CM | POA: Diagnosis not present

## 2018-05-07 DIAGNOSIS — F259 Schizoaffective disorder, unspecified: Principal | ICD-10-CM | POA: Diagnosis present

## 2018-05-07 DIAGNOSIS — S0101XA Laceration without foreign body of scalp, initial encounter: Secondary | ICD-10-CM | POA: Diagnosis present

## 2018-05-07 MED ORDER — OLANZAPINE 5 MG PO TBDP
5.0000 mg | ORAL_TABLET | Freq: Three times a day (TID) | ORAL | Status: DC | PRN
  Administered 2018-05-13: 5 mg via ORAL

## 2018-05-07 MED ORDER — ZIPRASIDONE MESYLATE 20 MG IM SOLR: 20.0000 mg | Freq: Two times a day (BID) | INTRAMUSCULAR | Status: DC | PRN

## 2018-05-07 MED ORDER — OLANZAPINE 5 MG PO TBDP
5.0000 mg | ORAL_TABLET | Freq: Two times a day (BID) | ORAL | Status: DC
  Filled 2018-05-07 (×3): qty 1

## 2018-05-07 MED ORDER — TRAZODONE HCL 50 MG PO TABS
50.0000 mg | ORAL_TABLET | Freq: Every evening | ORAL | Status: DC | PRN
  Administered 2018-05-07 – 2018-05-09 (×4): 50 mg via ORAL
  Filled 2018-05-07 (×4): qty 1

## 2018-05-07 MED ORDER — HYDROXYZINE HCL 50 MG PO TABS
50.0000 mg | ORAL_TABLET | Freq: Four times a day (QID) | ORAL | Status: DC | PRN
  Administered 2018-05-07 – 2018-05-13 (×6): 50 mg via ORAL
  Filled 2018-05-07 (×3): qty 1
  Filled 2018-05-07: qty 10
  Filled 2018-05-07 (×4): qty 1

## 2018-05-07 MED ORDER — PALIPERIDONE ER 6 MG PO TB24
6.0000 mg | ORAL_TABLET | Freq: Every day | ORAL | Status: DC
  Administered 2018-05-07 – 2018-05-11 (×5): 6 mg via ORAL
  Filled 2018-05-07 (×6): qty 1

## 2018-05-07 MED ORDER — LORAZEPAM 1 MG PO TABS
2.0000 mg | ORAL_TABLET | Freq: Four times a day (QID) | ORAL | Status: DC | PRN
  Administered 2018-05-07 – 2018-05-13 (×8): 2 mg via ORAL
  Filled 2018-05-07 (×8): qty 2

## 2018-05-07 MED ORDER — ACETAMINOPHEN 325 MG PO TABS
650.0000 mg | ORAL_TABLET | Freq: Four times a day (QID) | ORAL | Status: DC | PRN
  Administered 2018-05-07 – 2018-05-10 (×4): 650 mg via ORAL
  Filled 2018-05-07 (×4): qty 2

## 2018-05-07 MED ORDER — LORAZEPAM 2 MG/ML IJ SOLN: 2.0000 mg | Freq: Four times a day (QID) | INTRAMUSCULAR | Status: DC | PRN

## 2018-05-07 NOTE — Progress Notes (Signed)
Patient ID: Anthony Knox, male   DOB: 1961-02-22, 57 y.o.   MRN: [id number] PER STATE REGULATIONS 482.30  THIS CHART WAS REVIEWED FOR MEDICAL NECESSITY WITH RESPECT TO THE PATIENT'S ADMISSION/DURATION OF STAY.  NEXT REVIEW DATE:05/11/18  Loura Halt, RN, BSN CASE MANAGER

## 2018-05-07 NOTE — ED Notes (Signed)
[employer] notified for transport to [facility] 

## 2018-05-07 NOTE — Progress Notes (Addendum)
Nursing 1:1 note 2200 D:Pt observed lying on mattress on floor. RR even and unlabored. No distress noted.Pt took night medications without issue. Pt presented Ox2 - self, place . Pt presented confused , but was appropriate this evening.  A: 1:1 observation continues for safety  R: pt remains safe.   Nursing 1:1 note 0200 D:Pt observed sleeping on mattress on floor with eyes closed. RR even and unlabored. No distress noted. A: 1:1 observation continues for safety  R: pt remains safe  Nursing 1:1 note  0600 D:Pt observed sleeping in bed with eyes closed. RR even and unlabored. No distress noted. A: 1:1 observation continues for safety  R: pt remains safe

## 2018-05-07 NOTE — ED Notes (Signed)
Regular Diet was ordered for Lunch. 

## 2018-05-07 NOTE — ED Notes (Signed)
IVC paperwork faxed to [facility] per their request  

## 2018-05-07 NOTE — ED Notes (Signed)
All belongings given to [facility] officers to take to [facility].

## 2018-05-07 NOTE — Progress Notes (Signed)
Patient did not attend group.

## 2018-05-07 NOTE — Progress Notes (Signed)
1:1 Note Pt placed on 1:1 for elopement and aggression. Pt is very unpredictable, has anger issues, and confused. Pt  slammed the main door calling security to let him out of here , pt did the same thing in the search room, threw the papers on the floor when asked to sign and threw the chairs around. Pt is in the room at this time, staff by the side, will continue to monitor.

## 2018-05-07 NOTE — Progress Notes (Signed)
Anthony Knox is a 57 y.o. male involuntarily  admitted from [facility]. Pt became irritable and aggressive during admission process. Pt was asked if he drink alcohol, pt became irritable stating that the writer was implying  that he was an alcoholic. Pt stated he was not going to sign his life away by signing any papers. Pt then took the papers and threw on the floor, pushed the chairs stated, " I want to storm out of here, let me out of here, I refused all these. " STARR was at that, pt then calm down, asked for his shoe, took the laces out and was walked back to the unit. While on the unit, pt remains guarded, suspicious and paranoid. Pt remained back during lunch, food and drink provided, will continue to monitor.

## 2018-05-07 NOTE — ED Notes (Signed)
Pt given breakfast tray

## 2018-05-07 NOTE — H&P (Signed)
Psychiatric Admission Assessment Adult  Patient Identification: Anthony Knox MRN:  [id number] Date of Evaluation:  05/07/2018 Chief Complaint:  schizoaffective disorder  Principal Diagnosis: Schizoaffective disorder (HCC) Diagnosis:   Patient Active Problem List   Diagnosis Date Noted  . Schizoaffective disorder (HCC) [F25.9] 05/07/2018  . Psychosis (HCC) [F29] 05/06/2018  . Disorientation [R41.0]   . Scalp laceration [S01.01XA]   . ANKLE PAIN [M25.579] 11/13/2008   History of Present Illness:   [name] is a 57 y/o M with history of schizoaffective disorder who was [facility] from [facility] after he was found in his vehicle in the parking lot of a restaurant with a laceration to his scalp. Pt reported that he had fallen at home. He was a poor historian, disoriented and unable to provide recent narrative of events.  Pt was medically cleared in the ED. He was placed on IVC after attempting to elope from the ED. He was transferred to [facility] for additional treatment and stabilization. During intake process at [facility], pt became acutely agitated and threw a chair, but he was able to be verbally de-escalated.  Upon initial interview, pt shares, "They were giving me the runaraound; I felt trapped so I tried to walk off so I could get some fresh air." Pt was asked about events prior to coming to the hospital, and he shares, "I was feeling dizzy. I must have fallen. I woke up in all this blood." He does not recall being in his vehicle prior to admission and he is unable to provide a clear narrative of recent events prior to coming to the hospital. When asked with whom he lives, pt shares, "I live with my mother. I mean in spirit. I have her urn but it's too big - I don't think it's her." Pt denies mood problems aside from some mild depressed mood prior to admission. He denies SI/HI/AH/VH. He denies symptoms of mania, OCD, and PTSD. He denies all illicit substance use including alcohol and tobacco. When  asked orientation questions, pt replies that the year is "2004" the month is "March" and his current location is [place].  Discussed with patient about treatment options. He currently takes no medications and he has not followed up on outpatient basis for "at least a year." He previously was with ACT team at [facility], and pt makes vague/paranoid reference regarding his care there, stating that they were trying to hurt him. He reports previous trials of depakote (caused weight gain and increased SI), risperdal (caused cognitive dulling), and geodon (helpful for sleep). Pt reports he has a hard time with taking his medications. Discussed with patient about availability of medications with long-acting injection form so that he does not have to remember to take his medications daily, and pt expresses that he would be interested in trial of a medication that has a long-acting form. He is also concerned about weight gain. He is in agreement to trial of Invega with potential of transition to long-acting [place] if pt has good tolerability and efficacy. Pt was in agreement with the above plan, and he had no further questions, comments, or concerns.   Associated Signs/Symptoms: Depression Symptoms:  fatigue, anxiety, (Hypo) Manic Symptoms:  Irritable Mood, Labiality of Mood, Anxiety Symptoms:  Excessive Worry, Psychotic Symptoms:  Delusions, PTSD Symptoms: NA Total Time spent with patient: 1 hour  Past Psychiatric History:  -dx of schizoaffective disorder bipolar type - previous inpt at [facility] in 2012 - previous ACT team with [facility] but has not followed up "for a couple of years" - no hx of suicide attempt  Is the patient at risk to self? Yes.    Has the patient been a risk to self in the past 6 months? Yes.    Has the patient been a risk to self within the distant past? Yes.    Is the patient a risk to others? Yes.    Has the patient been a risk to others in the past 6  months? Yes.    Has the patient been a risk to others within the distant past? Yes.     Prior Inpatient Therapy:   Prior Outpatient Therapy:    Alcohol Screening:   Substance Abuse History in the last 12 months:  No. Consequences of Substance Abuse: NA Previous Psychotropic Medications: Yes  Psychological Evaluations: Yes  Past Medical History: No past medical history on file. No past surgical history on file. Family History: No family history on file. Family Psychiatric  History: denies family psychiatric history Tobacco Screening:   Social History: Pt was born and raised in [place]. He lives on his own in an apartment in [place]. He completed high school. He is not working; he is on disability for mental health diagnoses. He has never been married and he has no children. He denies trauma history.  Social History   Substance and Sexual Activity  Alcohol Use Not on file     Social History   Substance and Sexual Activity  Drug Use Not on file    Additional Social History:                           Allergies:  No Known Allergies Lab Results: No results found for this or any previous visit (from the past 48 hour(s)).  Blood Alcohol level:  Lab Results  Component Value Date   ETH <10 05/05/2018   [facility]  07/13/2010    <5        LOWEST DETECTABLE LIMIT FOR SERUM ALCOHOL IS 5 mg/dL FOR MEDICAL PURPOSES ONLY    Metabolic Disorder Labs:  No results found for: HGBA1C, MPG No results found for: PROLACTIN No results found for: CHOL, TRIG, HDL, CHOLHDL, VLDL, LDLCALC  Current Medications: Current Facility-Administered Medications  Medication Dose Route Frequency Provider Last Rate Last Dose  . acetaminophen (TYLENOL) tablet 650 mg  650 mg Oral Q6H PRN Micheal Likens, MD      . hydrOXYzine (ATARAX/VISTARIL) tablet 50 mg  50 mg Oral Q6H PRN Micheal Likens, MD      . LORazepam (ATIVAN) tablet 2 mg  2 mg Oral Q6H PRN Micheal Likens,  MD       Or  . LORazepam (ATIVAN) injection 2 mg  2 mg Intramuscular Q6H PRN Micheal Likens, MD      . OLANZapine zydis (ZYPREXA) disintegrating tablet 5 mg  5 mg Oral Q8H PRN Micheal Likens, MD       Or  . ziprasidone (GEODON) injection 20 mg  20 mg Intramuscular Q12H PRN Micheal Likens, MD      . paliperidone (INVEGA) 24 hr tablet 6 mg  6 mg Oral Daily Thao Vanover T, MD      . traZODone (DESYREL) tablet 50 mg  50 mg Oral QHS PRN,MR X 1 Manuel Dall T, MD       PTA Medications: No medications prior to admission.    Musculoskeletal: Strength &  Muscle Tone: within normal limits Gait & Station: normal Patient leans: N/A  Psychiatric Specialty Exam: Physical Exam  Nursing note and vitals reviewed.   Review of Systems  Constitutional: Negative for chills and fever.  Respiratory: Negative for cough and shortness of breath.   Cardiovascular: Negative for chest pain.  Gastrointestinal: Negative for abdominal pain, heartburn, nausea and vomiting.  Psychiatric/Behavioral: Negative for depression, hallucinations and suicidal ideas. The patient is nervous/anxious. The patient does not have insomnia.     There were no vitals taken for this visit.There is no height or weight on file to calculate BMI.  General Appearance: Casual and Disheveled  Eye Contact:  Fair  Speech:  Clear and Coherent and Normal Rate  Volume:  Normal  Mood:  Irritable  Affect:  Blunt and Labile  Thought Process:  Disorganized and Descriptions of Associations: Loose  Orientation:  Other:  only oriented to self. Date = "March", Year = "2004", location= "[place]"  Thought Content:  Illogical, Tangential and Abstract Reasoning  Suicidal Thoughts:  No  Homicidal Thoughts:  No  Memory:  Immediate;   Poor Recent;   Poor Remote;   Poor  Judgement:  Poor  Insight:  Lacking  Psychomotor Activity:  Normal  Concentration:  Concentration: Poor  Recall:  Poor  Fund of  Knowledge:  Fair  Language:  Fair  Akathisia:  No  Handed:    AIMS (if indicated):     Assets:  Desire for Improvement  ADL's:  Intact  Cognition:  WNL  Sleep:       Treatment Plan Summary: Daily contact with patient to assess and evaluate symptoms and progress in treatment and Medication management  Observation Level/Precautions:  15 minute checks  Laboratory:  CBC Chemistry Profile HbAIC UDS UA  Psychotherapy:  Encourage participation in groups and therapeutic milieu   Medications:  Start invega 6mg  po qDay. Start vistaril 50mg  po q6h prn anxiety. Start trazodone 50mg  po qhs prn insomnia. Start agitation protocol with zydis 5mg  po q8h prn agitation OR geodon 20mg  IM q12h prn severe agitation, and Ativan 1mg  po/IM q6h prn agitation. See MAR for additional PRN orders.  Consultations:    Discharge Concerns:    Estimated LOS: 5-7 days  Other:     Physician Treatment Plan for Primary Diagnosis: Schizoaffective disorder (HCC) Long Term Goal(s): Improvement in symptoms so as ready for discharge  Short Term Goals: Ability to identify and develop effective coping behaviors will improve  Physician Treatment Plan for Secondary Diagnosis: Principal Problem:   Schizoaffective disorder (HCC)  Long Term Goal(s): Improvement in symptoms so as ready for discharge  Short Term Goals: Ability to identify triggers associated with substance abuse/mental health issues will improve  I certify that inpatient services furnished can reasonably be expected to improve the patient's condition.    Micheal Likens, MD 10/25/201912:53 PM

## 2018-05-07 NOTE — ED Notes (Signed)
Patient was given a snack and drink for snack. 

## 2018-05-07 NOTE — ED Notes (Signed)
Report given to Jane at [facility]. 

## 2018-05-07 NOTE — Progress Notes (Signed)
1:1 Note  Pt is in his room lying on his mattress on the floor, pt has been calm, ate dinner without any problems. Pt remains on 1:1 for safety, staff by the pt side, will continue to monitor.

## 2018-05-07 NOTE — [facility] Suicide Risk Assessment (Signed)
[facility] Admission Suicide Risk Assessment   Nursing information obtained from:    Demographic factors:    Current Mental Status:    Loss Factors:    Historical Factors:    Risk Reduction Factors:     Total Time spent with patient: 30 minutes Principal Problem: Schizoaffective disorder (HCC) Diagnosis:   Patient Active Problem List   Diagnosis Date Noted  . Schizoaffective disorder (HCC) [F25.9] 05/07/2018  . Psychosis (HCC) [F29] 05/06/2018  . Disorientation [R41.0]   . Scalp laceration [S01.01XA]   . ANKLE PAIN [M25.579] 11/13/2008   Subjective Data: see H&P  Continued Clinical Symptoms:    The "Alcohol Use Disorders Identification Test", Guidelines for Use in Primary Care, Second Edition.  World Science writer [facility]). Score between 0-7:  no or low risk or alcohol related problems. Score between 8-15:  moderate risk of alcohol related problems. Score between 16-19:  high risk of alcohol related problems. Score 20 or above:  warrants further diagnostic evaluation for alcohol dependence and treatment.    Psychiatric Specialty Exam: Physical Exam  Nursing note and vitals reviewed.     Blood pressure 103/66, pulse 71, temperature 98 F (36.7 C), temperature source Oral, resp. rate 18, height 5\' 6"  (1.676 m), weight 74.8 kg, SpO2 100 %.Body mass index is 26.63 kg/m.    COGNITIVE FEATURES THAT CONTRIBUTE TO RISK:  None    SUICIDE RISK:   Minimal: No identifiable suicidal ideation.  Patients presenting with no risk factors but with morbid ruminations; may be classified as minimal risk based on the severity of the depressive symptoms  PLAN OF CARE: see H&P  I certify that inpatient services furnished can reasonably be expected to improve the patient's condition.   Anthony Likens, MD 05/07/2018, 1:10 PM

## 2018-05-08 DIAGNOSIS — F250 Schizoaffective disorder, bipolar type: Secondary | ICD-10-CM

## 2018-05-08 DIAGNOSIS — F419 Anxiety disorder, unspecified: Secondary | ICD-10-CM

## 2018-05-08 DIAGNOSIS — G4700 Insomnia, unspecified: Secondary | ICD-10-CM

## 2018-05-08 LAB — LIPID PANEL
CHOLESTEROL: 141 mg/dL (ref 0–200)
HDL: 36 mg/dL — ABNORMAL LOW (ref >40)
LDL CALC: 87 mg/dL (ref 0–99)
Total CHOL/HDL Ratio: 3.9 RATIO
Triglycerides: 91 mg/dL (ref <150)
VLDL: 18 mg/dL (ref 0–40)

## 2018-05-08 LAB — HEMOGLOBIN A1C
HEMOGLOBIN A1C: 5.9 % — AB (ref 4.8–5.6)
Mean Plasma Glucose: 122.63 mg/dL

## 2018-05-08 LAB — TSH: TSH: 4.402 u[IU]/mL (ref 0.350–4.500)

## 2018-05-08 NOTE — Progress Notes (Signed)
1:1 Note Pt reported more confused, mumbling words which not making any sense. Pt also required assistant getting up from the mattress on the floor, required one assist to ambulate, and to take a bath. Pt has been calm, took his morning medication without any problems, sat a little bit in the day room after bath then went back to his room. Py is lying down at this time, staff by his side, will continue to monitor.

## 2018-05-08 NOTE — Progress Notes (Signed)
1:1 Note Pt has been restless this evening, complained of his 1:1 giving him advise, complained of not having a room mate, complained of not having hospital bed in his room, com plained about food. Pt redirected, offered dinner and walked to his room to eat. Pt still have unsteady gait and requires constant reminder. Pt remains on 1:1 for safety, will continue to monitor.

## 2018-05-08 NOTE — Progress Notes (Signed)
[facility] MD Progress Note  05/08/2018 1:49 PM Anthony Knox  MRN:  [id number]  Subjective: Anthony Knox reports, "I have been here all my life. I'm here because it is Meant to be. I just woke up, I guest that is what mood is. I'm not hearing any voices, just things".  Anthony Knox is a 57 y/o M with history of schizoaffective disorder who was admitted on [facility]C from [facility] after he was found in his vehicle in the parking lot of a restaurant with a laceration to his scalp. Pt reported that he had fallen at home. He was a poor historian, disoriented and unable to provide recent narrative of events.  Pt was medically cleared in the ED. He was placed on IVC after attempting to elope from the ED. He was transferred to [facility] for additional treatment and stabilization. During intake process at [facility], pt became acutely agitated and threw a chair, but he was able to be verbally de-escalated. Upon initial interview, pt shares, "They were giving me the runaraound; I felt trapped so I tried to walk off so I could get some fresh air." Pt was asked about events prior to coming to the hospital, and he shares, "I was feeling dizzy. I must have fallen. I woke up in all this blood." He does not recall being in his vehicle prior to admission and he is unable to provide a clear narrative of recent events prior to coming to the hospital. When asked with whom he lives, pt shares, "I live with my mother. I mean in spirit. I have her urn but it's too big - I don't think it's her." Pt denies mood problems aside from some mild depressed mood prior to admission. He denies SI/HI/AH/VH. He denies symptoms of mania, OCD, and PTSD. He denies all illicit substance use including alcohol and tobacco. When asked orientation questions, pt replies that the year is "2004" the month is "March" and his current location is [place].  Anthony Knox is seen, chart reviewed. The chart findings discussed with the treatment team. He is lying down on a mattress placed on the  floor of his room. He presents alert, oriented to name only. He is unaware of situation or where he is presently. He is verbally responsive, however, his responses to the assessment questions are irrelevant & tangential. He is on I:1 supervision for safety & unit restriction due to disruptive behaviors. He is currently unable to provide any useful information about his state of mind. The Staff reports he is taking & tolerating his medication regimen. Does not appear to be in any distress.  Principal Problem: Schizoaffective disorder (HCC)  Diagnosis:   Patient Active Problem List   Diagnosis Date Noted  . Schizoaffective disorder (HCC) [F25.9] 05/07/2018  . Psychosis (HCC) [F29] 05/06/2018  . Disorientation [R41.0]   . Scalp laceration [S01.01XA]   . ANKLE PAIN [M25.579] 11/13/2008   Total Time spent with patient: 30 minutes  Past Psychiatric History: See H&P  Past Medical History: No past medical history on file. No past surgical history on file. Family History: No family history on file.  Family Psychiatric  History: See H&P  Social History:  Social History   Substance and Sexual Activity  Alcohol Use Not on file     Social History   Substance and Sexual Activity  Drug Use Not on file    Social History   Socioeconomic History  . Marital status: Single    Spouse name: Not on file  . Number of  children: Not on file  . Years of education: Not on file  . Highest education level: Not on file  Occupational History  . Not on file  Social Needs  . Financial resource strain: Not on file  . Food insecurity:    Worry: Not on file    Inability: Not on file  . Transportation needs:    Medical: Not on file    Non-medical: Not on file  Tobacco Use  . Smoking status: Not on file  Substance and Sexual Activity  . Alcohol use: Not on file  . Drug use: Not on file  . Sexual activity: Not on file  Lifestyle  . Physical activity:    Days per week: Not on file    Minutes per  session: Not on file  . Stress: Not on file  Relationships  . Social connections:    Talks on phone: Not on file    Gets together: Not on file    Attends religious service: Not on file    Active member of club or organization: Not on file    Attends meetings of clubs or organizations: Not on file    Relationship status: Not on file  Other Topics Concern  . Not on file  Social History Narrative  . Not on file   Additional Social History:   Sleep: Fair  Appetite:  Fair  Current Medications: Current Facility-Administered Medications  Medication Dose Route Frequency Provider Last Rate Last Dose  . acetaminophen (TYLENOL) tablet 650 mg  650 mg Oral Q6H PRN Micheal Likens, MD   650 mg at 05/07/18 2042  . hydrOXYzine (ATARAX/VISTARIL) tablet 50 mg  50 mg Oral Q6H PRN Micheal Likens, MD   50 mg at 05/07/18 2042  . LORazepam (ATIVAN) tablet 2 mg  2 mg Oral Q6H PRN Micheal Likens, MD   2 mg at 05/07/18 2042   Or  . LORazepam (ATIVAN) injection 2 mg  2 mg Intramuscular Q6H PRN Micheal Likens, MD      . OLANZapine zydis (ZYPREXA) disintegrating tablet 5 mg  5 mg Oral Q8H PRN Micheal Likens, MD       Or  . ziprasidone (GEODON) injection 20 mg  20 mg Intramuscular Q12H PRN Micheal Likens, MD      . paliperidone (INVEGA) 24 hr tablet 6 mg  6 mg Oral Daily Micheal Likens, MD   6 mg at 05/08/18 0853  . traZODone (DESYREL) tablet 50 mg  50 mg Oral QHS PRN,MR X 1 [place], Burlene Arnt, MD   50 mg at 05/07/18 2305   Lab Results:  Results for orders placed or performed during the hospital encounter of 05/07/18 (from the past 48 hour(s))  TSH     Status: None   Collection Time: 05/08/18  6:13 AM  Result Value Ref Range   TSH 4.402 0.350 - 4.500 uIU/mL    Comment: Performed by a 3rd Generation assay with a functional sensitivity of <=0.01 uIU/mL. Performed at [facility], [address] [place],  [place] [address]   Hemoglobin A1c     Status: Abnormal   Collection Time: 05/08/18  6:13 AM  Result Value Ref Range   Hgb A1c MFr Bld 5.9 (H) 4.8 - 5.6 %    Comment: (NOTE) Pre diabetes:          5.7%-6.4% Diabetes:              >6.4% Glycemic control for   <  7.0% adults with diabetes    Mean Plasma Glucose 122.63 mg/dL    Comment: Performed at [facility], [address] [place], [place] [address]  Lipid panel     Status: Abnormal   Collection Time: 05/08/18  6:13 AM  Result Value Ref Range   Cholesterol 141 0 - 200 mg/dL   Triglycerides 91 <604 mg/dL   HDL 36 (L) >54 mg/dL   Total CHOL/HDL Ratio 3.9 RATIO   VLDL 18 0 - 40 mg/dL   LDL Cholesterol 87 0 - 99 mg/dL    Comment:        Total Cholesterol/HDL:CHD Risk Coronary Heart Disease Risk Table                     Men   Women  1/2 Average Risk   3.4   3.3  Average Risk       5.0   4.4  2 X Average Risk   9.6   7.1  3 X Average Risk  23.4   11.0        Use the calculated Patient Ratio above and the CHD Risk Table to determine the patient's CHD Risk.        ATP III CLASSIFICATION (LDL):  <100     mg/dL   Optimal  098-119  mg/dL   Near or Above                    Optimal  130-159  mg/dL   Borderline  147-829  mg/dL   High  >562     mg/dL   Very High Performed at [facility], [address], [place], [place] [address]    Blood Alcohol level:  Lab Results  Component Value Date   ETH <10 05/05/2018   [facility]  07/13/2010    <5        LOWEST DETECTABLE LIMIT FOR SERUM ALCOHOL IS 5 mg/dL FOR MEDICAL PURPOSES ONLY   Metabolic Disorder Labs: Lab Results  Component Value Date   HGBA1C 5.9 (H) 05/08/2018   MPG 122.63 05/08/2018   No results found for: PROLACTIN Lab Results  Component Value Date   CHOL 141 05/08/2018   TRIG 91 05/08/2018   HDL 36 (L) 05/08/2018   CHOLHDL 3.9 05/08/2018   VLDL 18 05/08/2018   LDLCALC 87 05/08/2018   Physical Findings: AIMS: Facial and Oral Movements Muscles  of Facial Expression: None, normal Lips and Perioral Area: None, normal Jaw: None, normal Tongue: None, normal,Extremity Movements Upper (arms, wrists, hands, fingers): None, normal Lower (legs, knees, ankles, toes): None, normal, Trunk Movements Neck, shoulders, hips: None, normal, Overall Severity Severity of abnormal movements (highest score from questions above): None, normal Incapacitation due to abnormal movements: None, normal Patient's awareness of abnormal movements (rate only patient's report): No Awareness,    CIWA:    COWS:     Musculoskeletal: Strength & Muscle Tone: within normal limits Gait & Station: normal Patient leans: N/A  Psychiatric Specialty Exam: Physical Exam  Nursing note and vitals reviewed.   ROS  Blood pressure 128/78, pulse 81, temperature 97.8 F (36.6 C), temperature source Oral, resp. rate 18, height 5\' 6"  (1.676 m), weight 74.8 kg, SpO2 100 %.Body mass index is 26.63 kg/m.  General Appearance: Casual and Disheveled  Eye Contact:  Fair  Speech:  Clear and Coherent and Normal Rate  Volume:  Normal  Mood:  Irritable  Affect:  Blunt and Labile  Thought  Process:  Disorganized and Descriptions of Associations: Loose  Orientation:  Other:  only oriented to self. Date = "March", Year = "2004", location= "[place]"  Thought Content:  Illogical, Tangential and Abstract Reasoning  Suicidal Thoughts:  No  Homicidal Thoughts:  No  Memory:  Immediate;   Poor Recent;   Poor Remote;   Poor  Judgement:  Poor  Insight:  Lacking  Psychomotor Activity:  Normal  Concentration:  Concentration: Poor  Recall:  Poor  Fund of Knowledge:  Fair  Language:  Fair  Akathisia:  No  Handed:    AIMS (if indicated):     Assets:  Desire for Improvement  ADL's:  Intact  Cognition:  WNL  Sleep:  5.75     Treatment Plan Summary: Daily contact with patient to assess and evaluate symptoms and progress in treatment and Medication management.  - Continue inpatient  hospitalization.  - Will continue today 05/08/2018 plan as below except where it is noted.  Mood control.     - Continue invega 6mg  po qDay.   Anxiety.     - Continue vistaril 50mg  po q6h prn.   Insomnia     - Continue trazodone 50mg  po qhs prn.   Agitation protocol.     - Continue zydis 5mg  po q8h prn agitation.     OR     - Geodon 20mg  IM q12h prn severe agitation, and Ativan 1mg  po/IM q6h.   Insomnia.    - Continue Trazodone 50 mg po Q hs prn, (May repeat x 1).  Patient is currently on 1:1 supervision & unit restrictions due to exacerbations of symptoms.  Discharge disposition is ongoing.  Armandina Stammer, NP, pmhnp, fnp-bc. 05/08/2018, 1:49 PM

## 2018-05-08 NOTE — [facility] Group Notes (Signed)
  [facility] LCSW Group Therapy Note  Date/Time:  05/08/2018 11:15AM-12:00PM  Type of Therapy and Topic:  Group Therapy:  Feelings About Hospitalization  Participation Level:  Active   Description of Group This process group involved patients discussing their feelings related to being hospitalized, as well as the benefits they see to being in the hospital.  These feelings and benefits were itemized.  The group then brainstormed specific ways in which they could seek those same benefits when they discharge and return home.  Therapeutic Goals 1. Patient will identify and describe positive and negative feelings related to hospitalization 2. Patient will verbalize benefits of hospitalization to themselves personally 3. Patients will brainstorm together ways they can obtain similar benefits in the outpatient setting, identify barriers to wellness and possible solutions  Summary of Patient Progress:  The patient expressed his primary feelings about being hospitalized are "it's a new experience and people keep losing track of the arm bands, then forget about my problems."  He was a little confused but seemed interested in group.  Therapeutic Modalities Cognitive Behavioral Therapy Motivational Interviewing    [name], LCSW 05/08/2018, 1:19 PM

## 2018-05-08 NOTE — Progress Notes (Signed)
1:1 note    Pt is irritable and complaining about everything in general    He can be argumentative with staff and defensive    Pt almost lost his balance when he tried to put socks on and when staff notified the RN  Pt became defensive and denied his unsteadiness   Pt is on a 1:1 for safety and presently is safe

## 2018-05-08 NOTE — Progress Notes (Signed)
1:1 Note Pt is observed lying down on his mattress on the floor, pt also observed talking to himself, still confused, with unsteady gait. Pt alert and oriented x 2, ate lunch with no problem. No fall observed or reported at this time, not outburst reported. Remains on 1:1 observation for safety,will continue to monitor.

## 2018-05-09 ENCOUNTER — Encounter (HOSPITAL_COMMUNITY): Payer: Self-pay

## 2018-05-09 MED ORDER — ENSURE ENLIVE PO LIQD
237.0000 mL | Freq: Two times a day (BID) | ORAL | Status: DC
  Administered 2018-05-09 – 2018-05-11 (×4): 237 mL via ORAL

## 2018-05-09 NOTE — Progress Notes (Signed)
1:1 note Pt has been asleep since last assessment   His breathing is unlabored and he does not appear to be in any distress     Pt is on a 1:1 for safety due to impulsivity,unsteady gait and confusion   He is presently safe

## 2018-05-09 NOTE — Progress Notes (Signed)
1:1 Note 1740  Patient in room talking to 1:1.  Dinner was brought to patient by staff, meat, vegetables, fruit, salad, 2 drinks.  Respirations even and unlabored.  No signs/symptoms of pain/distress noted on patient's face/body movements.  Safety maintained with 1:1 per MD order.

## 2018-05-09 NOTE — Progress Notes (Signed)
1:1 Note 1700  Patient has been in dayroom watching football. Patient had refused ensure this afternoon.  Respirations even and unlabored.  No signs/symptoms of pain/distress noted on patient's face/body movements.  1:1 continues for patient's safety per MD order.
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
## 2018-05-09 NOTE — Progress Notes (Signed)
1:1 Note 10:30 am  Patient attended half of morning group.  Patient sat in chair during group, did not make any comments.  Patient seemed preoccupied, distracted.  Respirations even and unlabored.  No signs/symptoms of pain/distress noted on patient's face/body movements.  Safety maintained with 1:1 per MD orders

## 2018-05-09 NOTE — Progress Notes (Signed)
Patient's self inventory sheet, patient sleeps good, sleep medication helpful.  Good appetite, low energy level, poor concentration.  Rated anxiety and hopeless 10, anxiety 8.  Withdrawals, tremors, sedation, diarrhea, chilling, cravings, cramping, agitation, nausea, runny nose, irritability.  SI almost all the time, lightheaded, pain, dizziness, headaches, rash, blurred vision, all these entities combined.  Physical pain, worst pain 10, head, neck, feet, back, stomach.  Goal is how to get out of here and deal with this on my free time.  I come in here and its worst.  Catch up on bills that need paying, mailing.  Need carrying out.  Not sure of discharge plan.

## 2018-05-09 NOTE — Progress Notes (Signed)
1:1 Note 1840  Patient ate approximately 50% of his dinner in his room.  Patient now in dayroom watching tv with patients/staff.  Respirations even and unlabored.  No signs/symptoms of pain/distress noted on patient's face/body movements.  1:1 continues for patient's safety.

## 2018-05-09 NOTE — Plan of Care (Signed)
Nurse discussed anxiety, depression, coping skills with patient. 

## 2018-05-09 NOTE — Progress Notes (Signed)
1:1 Note 1300  Patient sat on his bed and ate lunch 100%.  Patient has been talking to MHT.  Patient now sitting in dayroom talking to patients and 1:1 staff.   Respirations even and unlabored.  No signs/symptoms of pain/distress noted on patient's face/body movements.  1:1 continues per MD orders for safety.

## 2018-05-09 NOTE — [facility] Group Notes (Signed)
[facility] Group Notes:  (Nursing/MHT/Case Management/Adjunct)  Date:  05/09/2018  Time:  9:00 - 10:00 am  Type of Therapy:  Psychoeducational Skills  And Goals Group  Participation Level:  None  Participation Quality:  Inattentive  Affect:  Flat  Cognitive:  Lacking  Insight:  Lacking  Engagement in Group:  Poor  Modes of Intervention:  Discussion and Education   Patient did not participate in group.  Patient came into group approximately 30 minutes.     Earline Mayotte 05/09/2018, 10:40 AM

## 2018-05-09 NOTE — Progress Notes (Signed)
1:1 Note 0900  Patient has been with 1:1 for safety.  Patient stated he had headache and was given tylenol.  Patient turned away from nurse and tried to not take his invega.  Nurse and MHT  requested patient to stand in front of nurse and take invega.  Patient did take his invega reluctantly.  Patient argued with nurse this morning.  Student nurse has been talking to patient this morning and he enjoys her company.  Respirations even and unlabored.  No signs/symptoms of pain/distress noted on patient's face/body movements.  1:1 continues for safety per MD order.

## 2018-05-09 NOTE — Progress Notes (Signed)
1:1 Note 1400  Patient asked to go outside with group for recreation.  Patient talked to [facility] and it was explained by [facility] that he is 1:1 for safety UR per MD order.   After [facility] explained to patient, pt did not argue with staff.  Patient walked to room and used the bathroom.  1:1 continues for patient's safety.  Respirations even and unlabored.  Safety maintained with 1:1 per MD order.

## 2018-05-09 NOTE — Progress Notes (Signed)
Nursing 1:1 note D:Pt observed sleeping in bed with eyes closed. RR even and unlabored. No distress noted. A: 1:1 observation continues for safety  R: pt remains safe  

## 2018-05-09 NOTE — [facility] Group Notes (Signed)
[facility] Group Notes:  (Nursing)  Date:  05/09/2018  Time: 400 PM Type of Therapy:  Nurse Education  Participation Level:  Active  Participation Quality:  Attentive  Affect:  Appropriate  Cognitive:  Appropriate  Insight:  Improving  Engagement in Group:  Improving  Modes of Intervention:  Discussion and Education  Summary of Progress/Problems: Nurse led relaxation group  Shela Nevin 05/09/2018, 7:36 PM
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
## 2018-05-09 NOTE — [facility] Group Notes (Signed)
[facility] LCSW Group Therapy Note  Date/Time:  05/09/2018  11:00AM-12:00PM  Type of Therapy and Topic:  Group Therapy:  Music and Mood  Participation Level:  Active   Description of Group: In this process group, members listened to a variety of genres of music and identified that different types of music evoke different responses.  Patients were encouraged to identify music that was soothing for them and music that was energizing for them.  Patients discussed how this knowledge can help with wellness and recovery in various ways including managing depression and anxiety as well as encouraging healthy sleep habits.    Therapeutic Goals: 1. Patients will explore the impact of different varieties of music on mood 2. Patients will verbalize the thoughts they have when listening to different types of music 3. Patients will identify music that is soothing to them as well as music that is energizing to them 4. Patients will discuss how to use this knowledge to assist in maintaining wellness and recovery 5. Patients will explore the use of music as a coping skill  Summary of Patient Progress:  At the beginning of group, patient expressed that he felt "okay."  He asked for numerous songs to be stopped.  He talked over a number of songs and had to be redirected.  At the end of group he said he felt "excellent."  Therapeutic Modalities: Solution Focused Brief Therapy Activity   [name], LCSW

## 2018-05-09 NOTE — Tx Team (Signed)
Initial Treatment Plan 05/09/2018 4:13 AM Helene Kelp ZOX:[id number]    PATIENT STRESSORS: Health problems Medication change or noncompliance Traumatic event   PATIENT STRENGTHS: General fund of knowledge Motivation for treatment/growth   PATIENT IDENTIFIED PROBLEMS: Pt too psychotic to answer                      DISCHARGE CRITERIA:  Improved stabilization in mood, thinking, and/or behavior Reduction of life-threatening or endangering symptoms to within safe limits Verbal commitment to aftercare and medication compliance  PRELIMINARY DISCHARGE PLAN: Attend aftercare/continuing care group Outpatient therapy Return to previous living arrangement  PATIENT/FAMILY INVOLVEMENT: This treatment plan has been presented to and reviewed with the patient, NOE GOYER, and/or family member, .  The patient and family have been given the opportunity to ask questions and make suggestions.  Andrena Mews, RN 05/09/2018, 4:13 AM

## 2018-05-09 NOTE — Progress Notes (Signed)
1:1 note Pt woke up around  2320 requesting his sleep medication or a medication to help him forget everything  Pt took the medication then threw the remaining water in the cup at his sitter and proceeded to say it was the sitters mess so he should clean it up  He almost lost his balance when he threw the cup of water   His thoughts are disorganized and he is delusional   He is confused and agitated and blaming staff for all of his troubles    He gets very offended when staff attempts to gently redirect him   He did take some medication and after a while went back to sleep   Pt is on 1:1 for safety and is presently safe

## 2018-05-10 LAB — PROLACTIN: PROLACTIN: 34.1 ng/mL — AB (ref 4.0–15.2)

## 2018-05-10 MED ORDER — TRAZODONE HCL 100 MG PO TABS
100.0000 mg | ORAL_TABLET | Freq: Every evening | ORAL | Status: DC | PRN
  Administered 2018-05-10 – 2018-05-13 (×8): 100 mg via ORAL
  Filled 2018-05-10 (×3): qty 1

## 2018-05-10 MED ORDER — BACITRACIN-NEOMYCIN-POLYMYXIN OINTMENT TUBE
TOPICAL_OINTMENT | CUTANEOUS | Status: DC | PRN
  Filled 2018-05-10: qty 14.17

## 2018-05-10 MED ORDER — ALUM & MAG HYDROXIDE-SIMETH 200-200-20 MG/5ML PO SUSP
30.0000 mL | Freq: Four times a day (QID) | ORAL | Status: DC | PRN
  Administered 2018-05-10: 30 mL via ORAL
  Filled 2018-05-10: qty 30

## 2018-05-10 NOTE — Progress Notes (Signed)
Nursing 1:1 note D:Pt observed sleeping in bed with eyes closed. RR even and unlabored. No distress noted. A: 1:1 observation continues for safety  R: pt remains safe  

## 2018-05-10 NOTE — [facility] Group Notes (Signed)
[facility] LCSW Group Therapy Note  Date/Time:05/10/18, 1315  Type of Therapy and Topic:  Group Therapy:  Overcoming Obstacles  Participation Level:  moderate  Description of Group:    In this group patients will be encouraged to explore what they see as obstacles to their own wellness and recovery. They will be guided to discuss their thoughts, feelings, and behaviors related to these obstacles. The group will process together ways to cope with barriers, with attention given to specific choices patients can make. Each patient will be challenged to identify changes they are motivated to make in order to overcome their obstacles. This group will be process-oriented, with patients participating in exploration of their own experiences as well as giving and receiving support and challenge from other group members.  Therapeutic Goals: 1. Patient will identify personal and current obstacles as they relate to admission. 2. Patient will identify barriers that currently interfere with their wellness or overcoming obstacles.  3. Patient will identify feelings, thought process and behaviors related to these barriers. 4. Patient will identify two changes they are willing to make to overcome these obstacles:    Summary of Patient Progress: Pt shared that "thinking too much" and mental health are current obstacles.  Pt made several good comments during group discussion, and was attentive.       Therapeutic Modalities:   Cognitive Behavioral Therapy Solution Focused Therapy Motivational Interviewing Relapse Prevention Therapy  Daleen Squibb, LCSW

## 2018-05-10 NOTE — Progress Notes (Signed)
D: Pt denies SI/HI/AVH. Pt is pleasant and cooperative. Pt visible on the unit this evening, pt in the dayroom with peers watching TV   A: Pt was offered support and encouragement. Pt was given scheduled medications. Pt was encourage to attend groups. Q 15 minute checks were done for safety.   R:Pt attends groups and interacts well with peers and staff. Pt is taking medication. Pt has no complaints.Pt receptive to treatment and safety maintained on unit.   Problem: Education: Goal: Emotional status will improve Outcome: Progressing   Problem: Activity: Goal: Interest or engagement in activities will improve Outcome: Progressing   Problem: Activity: Goal: Sleeping patterns will improve Outcome: Progressing

## 2018-05-10 NOTE — Progress Notes (Signed)
[facility] MD Progress Note  05/10/2018 2:37 PM Anthony Knox  MRN:  [id number]  Subjective: Anthony Knox reports, "I'm doing okay. I slept for a long time last night. The medicines are helping me to sleep. But, I am not Schizophrenic like I was told in the past. I know this because I am not trying to hurt anyone or myself. I think this mental health thing is a sham. They were treating my symptoms with medications that made me sleep all the time, lose my hair & gained a lot of weight. But, when I stopped these medicines, I felt better & think clearer. It has been about 2 years since I stopped all those medicines. Those previous medications were bad for me".  Anthony Knox is a 57 y/o M with history of schizoaffective disorder who was admitted on IVC from [facility] after he was found in his vehicle in the parking lot of a restaurant with a laceration to his scalp. Pt reported that he had fallen at home. He was a poor historian, disoriented and unable to provide recent narrative of events.  Pt was medically cleared in the ED. He was placed on IVC after attempting to elope from the ED. He was transferred to [facility] for additional treatment and stabilization. During intake process at [facility], pt became acutely agitated and threw a chair, but he was able to be verbally de-escalated. Upon initial interview, pt shares, "They were giving me the runaraound; I felt trapped so I tried to walk off so I could get some fresh air." Pt was asked about events prior to coming to the hospital, and he shares, "I was feeling dizzy. I must have fallen. I woke up in all this blood." He does not recall being in his vehicle prior to admission and he is unable to provide a clear narrative of recent events prior to coming to the hospital. When asked with whom he lives, pt shares, "I live with my mother. I mean in spirit. I have her urn but it's too big - I don't think it's her." Pt denies mood problems aside from some mild depressed mood prior to admission. He  denies SI/HI/AH/VH. He denies symptoms of mania, OCD, and PTSD. He denies all illicit substance use including alcohol and tobacco. When asked orientation questions, pt replies that the year is "2004" the month is "March" and his current location is [place].  Anthony Knox is seen, chart reviewed. The chart findings discussed with the treatment team. He presents alert, oriented to name & aware of situation. He is verbally responsive, attending group sessions. He is showing a remarkable improvement in his thought process. He is not making any tangential statements during this assessment.  He was on I:1 supervision for safety & unit restriction due to disruptive behaviors. Today, he is taken off both restriction/supervision. He is doing well so far. He is currently able to provide some useful information about his state of mind, his mental health hx & non-adherence to his medications in the past. He says the reason he was not complying with his previous treatment regimen was because, those medicines made him lose his hair & he also gained weight on them. He denies any SIHI, AVH, delusional thoughts or paranoia. He does not appear to be responding to any internal stimuli. The Staff reports he is taking & tolerating his medication regimen. Does not appear to be in any distress. Anthony Knox is in agreement to continue his current plan of care as already in progress.  Principal  Problem: Schizoaffective disorder (HCC)  Diagnosis:   Patient Active Problem List   Diagnosis Date Noted  . Schizoaffective disorder (HCC) [F25.9] 05/07/2018  . Psychosis (HCC) [F29] 05/06/2018  . Disorientation [R41.0]   . Scalp laceration [S01.01XA]   . ANKLE PAIN [M25.579] 11/13/2008   Total Time spent with patient: 30 minutes  Past Psychiatric History: See H&P  Past Medical History: History reviewed. No pertinent past medical history. History reviewed. No pertinent surgical history. Family History: History reviewed. No pertinent family  history.  Family Psychiatric  History: See H&P  Social History:  Social History   Substance and Sexual Activity  Alcohol Use Not on file     Social History   Substance and Sexual Activity  Drug Use Not on file    Social History   Socioeconomic History  . Marital status: Single    Spouse name: Not on file  . Number of children: Not on file  . Years of education: Not on file  . Highest education level: Not on file  Occupational History  . Not on file  Social Needs  . Financial resource strain: Not on file  . Food insecurity:    Worry: Not on file    Inability: Not on file  . Transportation needs:    Medical: Not on file    Non-medical: Not on file  Tobacco Use  . Smoking status: Unknown If Ever Smoked  Substance and Sexual Activity  . Alcohol use: Not on file  . Drug use: Not on file  . Sexual activity: Not on file  Lifestyle  . Physical activity:    Days per week: Not on file    Minutes per session: Not on file  . Stress: Not on file  Relationships  . Social connections:    Talks on phone: Not on file    Gets together: Not on file    Attends religious service: Not on file    Active member of club or organization: Not on file    Attends meetings of clubs or organizations: Not on file    Relationship status: Not on file  Other Topics Concern  . Not on file  Social History Narrative  . Not on file   Additional Social History:   Sleep: Fair  Appetite:  Fair  Current Medications: Current Facility-Administered Medications  Medication Dose Route Frequency Provider Last Rate Last Dose  . acetaminophen (TYLENOL) tablet 650 mg  650 mg Oral Q6H PRN Micheal Likens, MD   650 mg at 05/10/18 0503  . feeding supplement (ENSURE ENLIVE) (ENSURE ENLIVE) liquid 237 mL  237 mL Oral BID BM Donell Sievert E, PA-C   237 mL at 05/10/18 0828  . hydrOXYzine (ATARAX/VISTARIL) tablet 50 mg  50 mg Oral Q6H PRN Micheal Likens, MD   50 mg at 05/09/18 2127  .  LORazepam (ATIVAN) tablet 2 mg  2 mg Oral Q6H PRN Micheal Likens, MD   2 mg at 05/09/18 2127   Or  . LORazepam (ATIVAN) injection 2 mg  2 mg Intramuscular Q6H PRN Micheal Likens, MD      . neomycin-bacitracin-polymyxin (NEOSPORIN) ointment   Topical PRN Micheal Likens, MD      . OLANZapine zydis (ZYPREXA) disintegrating tablet 5 mg  5 mg Oral Q8H PRN Micheal Likens, MD       Or  . ziprasidone (GEODON) injection 20 mg  20 mg Intramuscular Q12H PRN Micheal Likens, MD      .  paliperidone (INVEGA) 24 hr tablet 6 mg  6 mg Oral Daily Micheal Likens, MD   6 mg at 05/10/18 9562  . traZODone (DESYREL) tablet 50 mg  50 mg Oral QHS PRN,MR X 1 [place], Burlene Arnt, MD   50 mg at 05/09/18 2255   Lab Results:  No results found for this or any previous visit (from the past 48 hour(s)). Blood Alcohol level:  Lab Results  Component Value Date   ETH <10 05/05/2018   [facility]  07/13/2010    <5        LOWEST DETECTABLE LIMIT FOR SERUM ALCOHOL IS 5 mg/dL FOR MEDICAL PURPOSES ONLY   Metabolic Disorder Labs: Lab Results  Component Value Date   HGBA1C 5.9 (H) 05/08/2018   MPG 122.63 05/08/2018   No results found for: PROLACTIN Lab Results  Component Value Date   CHOL 141 05/08/2018   TRIG 91 05/08/2018   HDL 36 (L) 05/08/2018   CHOLHDL 3.9 05/08/2018   VLDL 18 05/08/2018   LDLCALC 87 05/08/2018   Physical Findings: AIMS: Facial and Oral Movements Muscles of Facial Expression: None, normal Lips and Perioral Area: None, normal Jaw: None, normal Tongue: None, normal,Extremity Movements Upper (arms, wrists, hands, fingers): None, normal Lower (legs, knees, ankles, toes): None, normal, Trunk Movements Neck, shoulders, hips: None, normal, Overall Severity Severity of abnormal movements (highest score from questions above): None, normal Incapacitation due to abnormal movements: None, normal Patient's awareness of abnormal movements (rate  only patient's report): No Awareness, Dental Status Current problems with teeth and/or dentures?: No Does patient usually wear dentures?: No  CIWA:  CIWA-Ar Total: 4 COWS:  COWS Total Score: 2  Musculoskeletal: Strength & Muscle Tone: within normal limits Gait & Station: normal Patient leans: N/A  Psychiatric Specialty Exam: Physical Exam  Nursing note and vitals reviewed.   Review of Systems  Psychiatric/Behavioral: Positive for hallucinations (Hx. psychosis). Negative for depression, memory loss, substance abuse and suicidal ideas. The patient is not nervous/anxious and does not have insomnia.     Blood pressure 106/80, pulse 83, temperature 97.6 F (36.4 C), temperature source Oral, resp. rate 18, height 5\' 6"  (1.676 m), weight 74.8 kg, SpO2 100 %.Body mass index is 26.63 kg/m.  General Appearance: Casual and Disheveled  Eye Contact:  Fair  Speech:  Clear and Coherent and Normal Rate  Volume:  Normal  Mood:  Irritable  Affect:  Blunt and Labile  Thought Process:  Disorganized and Descriptions of Associations: Loose  Orientation:  Other:  only oriented to self. Date = "March", Year = "2004", location= "[place]"  Thought Content:  Illogical, Tangential and Abstract Reasoning  Suicidal Thoughts:  No  Homicidal Thoughts:  No  Memory:  Immediate;   Poor Recent;   Poor Remote;   Poor  Judgement:  Poor  Insight:  Lacking  Psychomotor Activity:  Normal  Concentration:  Concentration: Poor  Recall:  Poor  Fund of Knowledge:  Fair  Language:  Fair  Akathisia:  No  Handed:    AIMS (if indicated):     Assets:  Desire for Improvement  ADL's:  Intact  Cognition:  WNL  Sleep:  5.75     Treatment Plan Summary: Daily contact with patient to assess and evaluate symptoms and progress in treatment and Medication management.  - Continue inpatient hospitalization.  - Will continue today 05/10/2018 plan as below except where it is noted.  Mood control.     - Continue invega 6mg   po qDay.  Anxiety.     - Continue vistaril 50mg  po q6h prn.   Insomnia     - Continue trazodone 50mg  po qhs prn.   Agitation protocol.     - Continue zydis 5mg  po q8h prn agitation.     OR     - Geodon 20mg  IM q12h prn severe agitation, and Ativan 1mg  po/IM q6h.   Insomnia.    - Continue Trazodone 50 mg po Q hs prn, (May repeat x 1).  Discontinued 1:1 supervision & unit restrictions due to improvement of symptoms.  Discharge disposition is ongoing.  Armandina Stammer, NP, pmhnp, fnp-bc. 05/10/2018, 2:37 PMPatient ID: Helene Kelp, male   DOB: 07/22/1960, 57 y.o.   MRN: [id number]

## 2018-05-10 NOTE — Progress Notes (Signed)
1:1 Note: Patient maintained on constant supervision for safety.  Patient in the dayroom attending group and participated.  Medications given as prescribed. Presents with anxious affect and mood.  Routine safety checks maintained every 15 minutes.  Offered support and encouragement as needed.  Patient is safe on the unit with supervision.  Denies suicidal thoughts, auditory and visual hallucinations.

## 2018-05-10 NOTE — Progress Notes (Signed)
[facility] Post 1:1 Observation Documentation  For the first (8) hours following discontinuation of 1:1 precautions, a progress note entry by nursing staff should be documented at least every 2 hours, reflecting the patient's behavior, condition, mood, and conversation.  Use the progress notes for additional entries.  Time 1:1 discontinued:  12pm  Patient's Behavior:  Patient is calm and appropriate on the unit.  Patient's Condition:  Patient is alert and cooperative.  Patient's Conversation:  Minimal interaction with staff.  Anthony Knox 05/10/2018, 3:22 PM

## 2018-05-10 NOTE — Progress Notes (Signed)
[facility] Post 1:1 Observation Documentation  For the first (8) hours following discontinuation of 1:1 precautions, a progress note entry by nursing staff should be documented at least every 2 hours, reflecting the patient's behavior, condition, mood, and conversation.  Use the progress notes for additional entries.  Time 1:1 discontinued: 12pm    Patient's Behavior:  Patient is calm and visible in the hallway.  Patient's Condition:  Patient is calm and cooperative  Patient's Conversation:  Interacting with staff in the hallway  Anthony Knox 05/10/2018, 4:05 PM

## 2018-05-10 NOTE — Progress Notes (Signed)
[facility] Post 1:1 Observation Documentation  For the first (8) hours following discontinuation of 1:1 precautions, a progress note entry by nursing staff should be documented at least every 2 hours, reflecting the patient's behavior, condition, mood, and conversation.  Use the progress notes for additional entries.  Time 1:1 discontinued:  12pm  Patient's Behavior:  Patient is calm and cooperative.  Patient's Condition:  Patient visible in milieu.  Patient's Conversation:  Minimal interaction with staff.  Anthony Knox 05/10/2018, 3:24 PM

## 2018-05-10 NOTE — Progress Notes (Signed)
Nursing 1:1 note D:Pt observed sitting in dayroom getting vitals taken RR even and unlabored. No distress noted. A: 1:1 observation continues for safety  R: pt remains safe

## 2018-05-10 NOTE — Progress Notes (Signed)
Recreation Therapy Notes  Date: 10.28.19 Time: 1000 Location: 500 Hall Dayroom  Group Topic:  Goal Setting  Goal Area(s) Addresses:  Patient will be able to identify at least 3 life goals.   Patient will be able to identify benefit of investing in life goals.  Patient will be able to identify benefit of setting life goals.   Behavioral Response:  Minimal  Intervention: Worksheet, pencils  Activity: Life Goals.  Patients were to identify goals they wanted to accomplish in a wee, month, year and five years.  Patients were to then identify the obstacles they would face, what they needed in order to be successful and what they could start doing to work towards their goals.  Education:  Discharge Planning, Coping Skills, Life Goals   Education Outcome: Acknowledges Education/In Group Clarification Provided/Needs Additional Education  Clinical Observations: Pt was appropriate during group.  Pt stated his goal was to find peace within himself.  Pt stated he could accomplish this by "seeing beauty within everything".    Caroll Rancher, LRT/CTRS         Lillia Abed, Dryden Tapley A 05/10/2018 11:02 AM

## 2018-05-10 NOTE — Progress Notes (Signed)
[facility] Post 1:1 Observation Documentation  For the first (8) hours following discontinuation of 1:1 precautions, a progress note entry by nursing staff should be documented at least every 2 hours, reflecting the patient's behavior, condition, mood, and conversation.  Use the progress notes for additional entries.  Time 1:1 discontinued:  12pm  Patient's Behavior:  Pt calm in the dayroom  Patient's Condition:  orinted x3  Patient's Conversation:  Pt in group at this time  Anthony Knox 05/10/2018, 8:34 PM

## 2018-05-10 NOTE — Progress Notes (Signed)
Recreation Therapy Notes  INPATIENT RECREATION THERAPY ASSESSMENT  Patient Details Name: Anthony Knox MRN: [id number] DOB: May 31, 1961 Today's Date: 05/10/2018       Information Obtained From: Patient(Also chart review)  Able to Participate in Assessment/Interview: Yes  Patient Presentation: Alert(Pleasant)  Reason for Admission (Per Patient): (Per chart; Pt was irritable and aggressive at discharge)  Patient Stressors: (None expressed)  Coping Skills:   Music, Art  Leisure Interests (2+):  Ashby Dawes - Other (Comment), Art - Coloring(Listen to nature, go to the lake and woods)  Frequency of Recreation/Participation: Other (Comment)(Don't do it often)  Awareness of Community Resources:  Yes  Community Resources:  Park, Engineering geologist, [employer], Coffee Shop  Current Use: Yes  If no, Barriers?:    Expressed Interest in [employer] Information:    [place] of Residence:  [place]  Patient Main Form of Transportation: Set designer  Patient Strengths:  Born Medical illustrator; "relate to people that want to be related to"  Patient Identified Areas of Improvement:  Love life; Take medications  Patient Goal for Hospitalization:  "Try to find myself"  Current SI (including self-harm):  No  Current HI:  No  Current AVH: No  Staff Intervention Plan: Group Attendance, Collaborate with Interdisciplinary Treatment Team  Consent to Intern Participation: N/A    Caroll Rancher, LRT/CTRS  Caroll Rancher A 05/10/2018, 3:32 PM

## 2018-05-10 NOTE — Progress Notes (Signed)
[facility] Post 1:1 Observation Documentation  For the first (8) hours following discontinuation of 1:1 precautions, a progress note entry by nursing staff should be documented at least every 2 hours, reflecting the patient's behavior, condition, mood, and conversation.  Use the progress notes for additional entries.  Time 1:1 discontinued:  12 pm  Patient's Behavior: Patient is calm and appropriate on the unit.   Patient's Condition:  Patient is alert and oriented to person and place.  Patient's Conversation:  Minimal interaction with staff.  Anthony Knox 05/10/2018, 6:43 PM

## 2018-05-11 MED ORDER — PALIPERIDONE PALMITATE ER 156 MG/ML IM SUSY
156.0000 mg | PREFILLED_SYRINGE | INTRAMUSCULAR | Status: DC
  Administered 2018-05-13: 156 mg via INTRAMUSCULAR
  Filled 2018-05-11: qty 1

## 2018-05-11 MED ORDER — PALIPERIDONE PALMITATE ER 156 MG/ML IM SUSY: 156.0000 mg | PREFILLED_SYRINGE | INTRAMUSCULAR | Status: DC

## 2018-05-11 MED ORDER — PALIPERIDONE PALMITATE ER 234 MG/1.5ML IM SUSY
234.0000 mg | PREFILLED_SYRINGE | Freq: Once | INTRAMUSCULAR | Status: AC
  Administered 2018-05-11: 234 mg via INTRAMUSCULAR
  Filled 2018-05-11: qty 1.5

## 2018-05-11 MED ORDER — PALIPERIDONE PALMITATE ER 234 MG/1.5ML IM SUSY: 234.0000 mg | PREFILLED_SYRINGE | Freq: Once | INTRAMUSCULAR | Status: DC

## 2018-05-11 NOTE — [facility] Group Notes (Signed)
[facility] LCSW Group Therapy Note  Date/Time: 05/11/18, 1100  Type of Therapy/Topic:  Group Therapy:  Feelings about Diagnosis  Participation Level:  Minimal   Mood:pleasant   Description of Group:    This group will allow patients to explore their thoughts and feelings about diagnoses they have received. Patients will be guided to explore their level of understanding and acceptance of these diagnoses. Facilitator will encourage patients to process their thoughts and feelings about the reactions of others to their diagnosis, and will guide patients in identifying ways to discuss their diagnosis with significant others in their lives. This group will be process-oriented, with patients participating in exploration of their own experiences as well as giving and receiving support and challenge from other group members.   Therapeutic Goals: 1. Patient will demonstrate understanding of diagnosis as evidence by identifying two or more symptoms of the disorder:  2. Patient will be able to express two feelings regarding the diagnosis 3. Patient will demonstrate ability to communicate their needs through discussion and/or role plays  Summary of Patient Progress:Pt not paying attention in group today.  He was reading a book and drawing.  CSW asked him several questions and he did respond after asking CSW to repeat them, but was otherwise not at all engaged in the topic or discussion.        Therapeutic Modalities:   Cognitive Behavioral Therapy Brief Therapy Feelings Identification   Daleen Squibb, LCSW

## 2018-05-11 NOTE — Progress Notes (Signed)
Recreation Therapy Notes  Date: 10.29.19 Time: 1000 Location: 500 Hall Dayroom  Group Topic: Leisure Education  Goal Area(s) Addresses:  Patient will identify positive leisure activities.  Patient will identify one positive benefit of participation in leisure activities.   Behavioral Response: None  Intervention: [employer], dry erase marker, eraser, various leisure activities  Activity: Pictionary. Patients were given the opportunity to pull a leisure activity from the container.  Patients were to then draw the activity on the board.  The remaining patients were to guess what the activity was.  The person that correctly guesses the activity gets the next turn.   Education:  Leisure Education, Building control surveyor  Education Outcome: Acknowledges education/In group clarification offered/Needs additional education  Clinical Observations/Feedback:  Pt did not participate in activity.  Pt sat by the window writing in his workbook.  Pt also talked with the nursing students.  Pt was pleasant.    Caroll Rancher, LRT/CTRS         Caroll Rancher A 05/11/2018 12:28 PM

## 2018-05-11 NOTE — [facility] Suicide Risk Assessment (Signed)
[facility] INPATIENT:  Family/Significant Other Suicide Prevention Education  Suicide Prevention Education:  Contact Attempts: Kalai Baca, brother, [phone], has been identified by the patient as the family member/significant other with whom the patient will be residing, and identified as the person(s) who will aid the patient in the event of a mental health crisis.  With written consent from the patient, two attempts were made to provide suicide prevention education, prior to and/or following the patient's discharge.  We were unsuccessful in providing suicide prevention education.  A suicide education pamphlet was given to the patient to share with family/significant other.  Date and time of first attempt:05/11/18, 51 Date and time of second attempt:  Lorri Frederick, LCSW 05/11/2018, 2:33 PM

## 2018-05-11 NOTE — Progress Notes (Signed)
[facility] MD Progress Note  05/11/2018 2:57 PM Anthony Knox  MRN:  [id number] Subjective:    History as per intake: Anthony Knox is a 57 y/o M with history of schizoaffective disorder who was admitted on IVC from [facility] after he was found in his vehicle in the parking lot of a restaurant with a laceration to his scalp. Pt reported that he had fallen at home. He was a poor historian, disoriented and unable to provide recent narrative of events.  Pt was medically cleared in the ED. He was placed on IVC after attempting to elope from the ED. He was transferred to [facility] for additional treatment and stabilization. During intake process at [facility], pt became acutely agitated and threw a chair, but he was able to be verbally de-escalated. Upon initial interview, pt shares, "They were giving me the runaraound; I felt trapped so I tried to walk off so I could get some fresh air." Pt was asked about events prior to coming to the hospital, and he shares, "I was feeling dizzy. I must have fallen. I woke up in all this blood." He does not recall being in his vehicle prior to admission and he is unable to provide a clear narrative of recent events prior to coming to the hospital. When asked with whom he lives, pt shares, "I live with my mother. I mean in spirit. I have her urn but it's too big - I don't think it's her." Pt denies mood problems aside from some mild depressed mood prior to admission. He denies SI/HI/AH/VH. He denies symptoms of mania, OCD, and PTSD. He denies all illicit substance use including alcohol and tobacco. When asked orientation questions, pt replies that the year is "2004" the month is "March" and his current location is [place]. Discussed with patient about treatment options. He currently takes no medications and he has not followed up on outpatient basis for "at least a year." He previously was with ACT team at [facility], and pt makes vague/paranoid reference regarding his care there, stating that  they were trying to hurt him. He reports previous trials of depakote (caused weight gain and increased SI), risperdal (caused cognitive dulling), and geodon (helpful for sleep). Pt reports he has a hard time with taking his medications. Discussed with patient about availability of medications with long-acting injection form so that he does not have to remember to take his medications daily, and pt expresses that he would be interested in trial of a medication that has a long-acting form. He is also concerned about weight gain. He is in agreement to trial of Invega with potential of transition to long-acting [place] if pt has good tolerability and efficacy. Pt was in agreement with the above plan, and he had no further questions, comments, or concerns.   As per evaluation today: Pt was seen in the office. He is in no acute distress and he appears brighter in affect as compared to previous encounters. He shares, "Things are going alright." He has no specific concerns today. He slept poorly last night, but he reports that he was napping during the day, and pt was encouraged to attempt to consolidate his sleep to just the evenings. He denies SI/HI/AH/VH. He denies paranoia. He is alert and oriented to person, place, and time. He is tolerating his medications well, and he is in agreement to continue his current regimen without changes. Discussed with patient about potential transition to long-acting injectable form of [place], and pt was  in agreement. He is in agreement to discuss with SW team about referral to outpatient treatment, and he voices preference to follow up at a different location than [facility]. Pt was in agreement with the above plan, and he had no further questions, comments, or concerns.   Principal Problem: Schizoaffective disorder (HCC) Diagnosis:   Patient Active Problem List   Diagnosis Date Noted  . Schizoaffective disorder (HCC) [F25.9] 05/07/2018  . Psychosis  (HCC) [F29] 05/06/2018  . Disorientation [R41.0]   . Scalp laceration [S01.01XA]   . ANKLE PAIN [M25.579] 11/13/2008   Total Time spent with patient: 30 minutes  Past Psychiatric History: see H&P  Past Medical History: History reviewed. No pertinent past medical history. History reviewed. No pertinent surgical history. Family History: History reviewed. No pertinent family history. Family Psychiatric  History: see H&P Social History:  Social History   Substance and Sexual Activity  Alcohol Use Not on file     Social History   Substance and Sexual Activity  Drug Use Not on file    Social History   Socioeconomic History  . Marital status: Single    Spouse name: Not on file  . Number of children: Not on file  . Years of education: Not on file  . Highest education level: Not on file  Occupational History  . Not on file  Social Needs  . Financial resource strain: Not on file  . Food insecurity:    Worry: Not on file    Inability: Not on file  . Transportation needs:    Medical: Not on file    Non-medical: Not on file  Tobacco Use  . Smoking status: Unknown If Ever Smoked  Substance and Sexual Activity  . Alcohol use: Not on file  . Drug use: Not on file  . Sexual activity: Not on file  Lifestyle  . Physical activity:    Days per week: Not on file    Minutes per session: Not on file  . Stress: Not on file  Relationships  . Social connections:    Talks on phone: Not on file    Gets together: Not on file    Attends religious service: Not on file    Active member of club or organization: Not on file    Attends meetings of clubs or organizations: Not on file    Relationship status: Not on file  Other Topics Concern  . Not on file  Social History Narrative  . Not on file   Additional Social History:                         Sleep: Poor  Appetite:  Good  Current Medications: Current Facility-Administered Medications  Medication Dose Route  Frequency Provider Last Rate Last Dose  . acetaminophen (TYLENOL) tablet 650 mg  650 mg Oral Q6H PRN Micheal Likens, MD   650 mg at 05/10/18 0503  . alum & mag hydroxide-simeth (MAALOX/MYLANTA) 200-200-20 MG/5ML suspension 30 mL  30 mL Oral Q6H PRN Donell Sievert E, PA-C   30 mL at 05/10/18 2325  . feeding supplement (ENSURE ENLIVE) (ENSURE ENLIVE) liquid 237 mL  237 mL Oral BID BM Simon, Spencer E, PA-C   237 mL at 05/11/18 1451  . hydrOXYzine (ATARAX/VISTARIL) tablet 50 mg  50 mg Oral Q6H PRN Micheal Likens, MD   50 mg at 05/11/18 0225  . LORazepam (ATIVAN) tablet 2 mg  2 mg Oral Q6H PRN Yiselle Babich,  Burlene Arnt, MD   2 mg at 05/11/18 0225   Or  . LORazepam (ATIVAN) injection 2 mg  2 mg Intramuscular Q6H PRN Micheal Likens, MD      . neomycin-bacitracin-polymyxin (NEOSPORIN) ointment   Topical PRN Micheal Likens, MD      . OLANZapine zydis (ZYPREXA) disintegrating tablet 5 mg  5 mg Oral Q8H PRN Micheal Likens, MD       Or  . ziprasidone (GEODON) injection 20 mg  20 mg Intramuscular Q12H PRN Micheal Likens, MD      . [name] ON 05/13/2018] paliperidone (INVEGA SUSTENNA) injection 156 mg  156 mg Intramuscular Q28 days Micheal Likens, MD      . traZODone (DESYREL) tablet 100 mg  100 mg Oral QHS PRN,MR X 1 Kerry Hough, PA-C   100 mg at 05/10/18 2309    Lab Results: No results found for this or any previous visit (from the past 48 hour(s)).  Blood Alcohol level:  Lab Results  Component Value Date   ETH <10 05/05/2018   [facility]  07/13/2010    <5        LOWEST DETECTABLE LIMIT FOR SERUM ALCOHOL IS 5 mg/dL FOR MEDICAL PURPOSES ONLY    Metabolic Disorder Labs: Lab Results  Component Value Date   HGBA1C 5.9 (H) 05/08/2018   MPG 122.63 05/08/2018   Lab Results  Component Value Date   PROLACTIN 34.1 (H) 05/08/2018   Lab Results  Component Value Date   CHOL 141 05/08/2018   TRIG 91 05/08/2018   HDL 36 (L) 05/08/2018    CHOLHDL 3.9 05/08/2018   VLDL 18 05/08/2018   LDLCALC 87 05/08/2018    Physical Findings: AIMS: Facial and Oral Movements Muscles of Facial Expression: None, normal Lips and Perioral Area: None, normal Jaw: None, normal Tongue: None, normal,Extremity Movements Upper (arms, wrists, hands, fingers): None, normal Lower (legs, knees, ankles, toes): None, normal, Trunk Movements Neck, shoulders, hips: None, normal, Overall Severity Severity of abnormal movements (highest score from questions above): None, normal Incapacitation due to abnormal movements: None, normal Patient's awareness of abnormal movements (rate only patient's report): No Awareness, Dental Status Current problems with teeth and/or dentures?: No Does patient usually wear dentures?: No  CIWA:  CIWA-Ar Total: 4 COWS:  COWS Total Score: 2  Musculoskeletal: Strength & Muscle Tone: within normal limits Gait & Station: normal Patient leans: N/A  Psychiatric Specialty Exam: Physical Exam  Nursing note and vitals reviewed.   Review of Systems  Constitutional: Negative for chills and fever.  Respiratory: Negative for cough and shortness of breath.   Cardiovascular: Negative for chest pain.  Gastrointestinal: Negative for abdominal pain, heartburn, nausea and vomiting.  Psychiatric/Behavioral: Negative for depression, hallucinations and suicidal ideas. The patient is not nervous/anxious and does not have insomnia.     Blood pressure 106/80, pulse 83, temperature 97.6 F (36.4 C), temperature source Oral, resp. rate 18, height 5\' 6"  (1.676 m), weight 74.8 kg, SpO2 100 %.Body mass index is 26.63 kg/m.  General Appearance: Casual and Fairly Groomed  Eye Contact:  Good  Speech:  Clear and Coherent and Normal Rate  Volume:  Normal  Mood:  Euthymic  Affect:  Appropriate and Congruent  Thought Process:  Coherent and Goal Directed  Orientation:  Full (Time, Place, and Person)  Thought Content:  Logical  Suicidal  Thoughts:  No  Homicidal Thoughts:  No  Memory:  Immediate;   Fair Recent;   Fair Remote;  Fair  Judgement:  Fair  Insight:  Lacking  Psychomotor Activity:  Normal  Concentration:  Concentration: Fair  Recall:  [employer] of Knowledge:  Fair  Language:  Fair  Akathisia:  No  Handed:    AIMS (if indicated):     Assets:  Resilience Social Support  ADL's:  Intact  Cognition:  WNL  Sleep:  Number of Hours: 1   Treatment Plan Summary: Daily contact with patient to assess and evaluate symptoms and progress in treatment and Medication management   -Continue inpatient hospitalization  -Schizoaffective disorder, unspecified   -Discontinue invega (oral form)  -Start [place]. Administer Hinda Glatter Sustenna 234mg  IM once today followed by Gean Birchwood 156mg  IM q28 days starting 05/13/18.  -anxiety   -Continue vistaril 50mg  po q6h prn anxiety  -agitation   -Continue zydis 5mg  po q8h prn agitation   -Continue geodon 20mg  IM q12h prn severe agitation  -Continue ativan 2mg  po/IM q6h prn agitation  -insomnia   -Continue trazodone 100mg  po qhs prn insomnia (may repeat x1 prn insomnia)  -Encourage participation in groups and therapeutic milieu  -disposition planning will be ongoing  Micheal Likens, MD 05/11/2018, 2:57 PM

## 2018-05-11 NOTE — Progress Notes (Signed)
Patient ID: Anthony Knox, male   DOB: July 22, 1960, 57 y.o.   MRN: [id number] PER STATE REGULATIONS 482.30  THIS CHART WAS REVIEWED FOR MEDICAL NECESSITY WITH RESPECT TO THE PATIENT'S ADMISSION/ DURATION OF STAY.  NEXT REVIEW DATE:  05/15/2018 Willa Rough, RN, BSN CASE MANAGER

## 2018-05-11 NOTE — Tx Team (Signed)
Interdisciplinary Treatment and Diagnostic Plan Update  05/11/2018 Time of Session: 1436 Anthony Knox MRN: [id number]  Principal Diagnosis: Schizoaffective disorder [facility])  Secondary Diagnoses: Principal Problem:   Schizoaffective disorder (HCC)   Current Medications:  Current Facility-Administered Medications  Medication Dose Route Frequency Provider Last Rate Last Dose  . acetaminophen (TYLENOL) tablet 650 mg  650 mg Oral Q6H PRN Micheal Likens, MD   650 mg at 05/10/18 0503  . alum & mag hydroxide-simeth (MAALOX/MYLANTA) 200-200-20 MG/5ML suspension 30 mL  30 mL Oral Q6H PRN Donell Sievert E, PA-C   30 mL at 05/10/18 2325  . feeding supplement (ENSURE ENLIVE) (ENSURE ENLIVE) liquid 237 mL  237 mL Oral BID BM Donell Sievert E, PA-C   237 mL at 05/10/18 0828  . hydrOXYzine (ATARAX/VISTARIL) tablet 50 mg  50 mg Oral Q6H PRN Micheal Likens, MD   50 mg at 05/11/18 0225  . LORazepam (ATIVAN) tablet 2 mg  2 mg Oral Q6H PRN Micheal Likens, MD   2 mg at 05/11/18 0225   Or  . LORazepam (ATIVAN) injection 2 mg  2 mg Intramuscular Q6H PRN Micheal Likens, MD      . neomycin-bacitracin-polymyxin (NEOSPORIN) ointment   Topical PRN Micheal Likens, MD      . OLANZapine zydis (ZYPREXA) disintegrating tablet 5 mg  5 mg Oral Q8H PRN Micheal Likens, MD       Or  . ziprasidone (GEODON) injection 20 mg  20 mg Intramuscular Q12H PRN Micheal Likens, MD      . paliperidone (INVEGA) 24 hr tablet 6 mg  6 mg Oral Daily Micheal Likens, MD   6 mg at 05/11/18 0748  . traZODone (DESYREL) tablet 100 mg  100 mg Oral QHS PRN,MR X 1 Donell Sievert E, PA-C   100 mg at 05/10/18 2309   PTA Medications: No medications prior to admission.    Patient Stressors: Health problems Medication change or noncompliance Traumatic event  Patient Strengths: General fund of knowledge Motivation for treatment/growth  Treatment Modalities: Medication  Management, Group therapy, Case management,  1 to 1 session with clinician, Psychoeducation, Recreational therapy.   Physician Treatment Plan for Primary Diagnosis: Schizoaffective disorder (HCC) Long Term Goal(s): Improvement in symptoms so as ready for discharge Improvement in symptoms so as ready for discharge   Short Term Goals: Ability to identify and develop effective coping behaviors will improve Ability to identify triggers associated with substance abuse/mental health issues will improve  Medication Management: Evaluate patient's response, side effects, and tolerance of medication regimen.  Therapeutic Interventions: 1 to 1 sessions, Unit Group sessions and Medication administration.  Evaluation of Outcomes: Progressing  Physician Treatment Plan for Secondary Diagnosis: Principal Problem:   Schizoaffective disorder (HCC)  Long Term Goal(s): Improvement in symptoms so as ready for discharge Improvement in symptoms so as ready for discharge   Short Term Goals: Ability to identify and develop effective coping behaviors will improve Ability to identify triggers associated with substance abuse/mental health issues will improve     Medication Management: Evaluate patient's response, side effects, and tolerance of medication regimen.  Therapeutic Interventions: 1 to 1 sessions, Unit Group sessions and Medication administration.  Evaluation of Outcomes: Progressing   RN Treatment Plan for Primary Diagnosis: Schizoaffective disorder (HCC) Long Term Goal(s): Knowledge of disease and therapeutic regimen to maintain health will improve  Short Term Goals: Ability to identify and develop effective coping behaviors will improve and Compliance with prescribed medications will improve  Medication Management: RN will administer medications as ordered by provider, will assess and evaluate patient's response and provide education to patient for prescribed medication. RN will report any  adverse and/or side effects to prescribing provider.  Therapeutic Interventions: 1 on 1 counseling sessions, Psychoeducation, Medication administration, Evaluate responses to treatment, Monitor vital signs and CBGs as ordered, Perform/monitor CIWA, COWS, AIMS and Fall Risk screenings as ordered, Perform wound care treatments as ordered.  Evaluation of Outcomes: Progressing   LCSW Treatment Plan for Primary Diagnosis: Schizoaffective disorder (HCC) Long Term Goal(s): Safe transition to appropriate next level of care at discharge, Engage patient in therapeutic group addressing interpersonal concerns.  Short Term Goals: Engage patient in aftercare planning with referrals and resources, Increase social support and Increase skills for wellness and recovery  Therapeutic Interventions: Assess for all discharge needs, 1 to 1 time with Social worker, Explore available resources and support systems, Assess for adequacy in community support network, Educate family and significant other(s) on suicide prevention, Complete Psychosocial Assessment, Interpersonal group therapy.  Evaluation of Outcomes: Progressing   Progress in Treatment: Attending groups: Yes. Participating in groups: Yes. Taking medication as prescribed: Yes. Toleration medication: Yes. Family/Significant other contact made: No, will contact:  brother Patient understands diagnosis: Yes. Discussing patient identified problems/goals with staff: Yes. Medical problems stabilized or resolved: Yes. Denies suicidal/homicidal ideation: Yes. Issues/concerns per patient self-inventory: No. Other: none  New problem(s) identified: No, Describe:  none  New Short Term/Long Term Goal(s):  Patient Goals:  "Make an impression that I'm well."  Discharge Plan or Barriers:   Reason for Continuation of Hospitalization: Hallucinations Medication stabilization  Estimated Length of Stay: 3-5 days.  Attendees: Patient:Anthony Knox 05/10/2018    Physician: Dr. Altamese Peoa, MD 05/10/2018   Nursing: Dossie Arbour, RN 05/10/2018   RN Care Manager: 05/10/2018   Social Worker: Daleen Squibb, LCSW 05/10/2018   Recreational Therapist:  05/10/2018   Other:  05/10/2018   Other:  05/10/2018   Other: 05/10/2018        Scribe for Treatment Team: Lorri Frederick, LCSW 05/11/2018 8:23 AM

## 2018-05-11 NOTE — Plan of Care (Signed)
  Problem: Education: Goal: Emotional status will improve Outcome: Progressing Goal: Mental status will improve Outcome: Progressing   Problem: Education: Goal: Mental status will improve Outcome: Progressing  DAR NOTE: Patient presents with calm affect and pleasant mood.  Denies suicidal thoughts, pain, auditory and visual hallucinations.  Described energy level as normal and concentration as low.  Rates depression at 0, hopelessness at 0, and anxiety at 0.  Maintained on routine safety checks.  Medications given as prescribed.  Support and encouragement offered as needed.  Attended group and participated.  Patient visible in milieu with minimal interaction.  Offered no complaint.

## 2018-05-12 NOTE — Progress Notes (Signed)
Adult Psychoeducational Group Note  Date:  05/12/2018 Time:  8:47 PM  Group Topic/Focus:  Wrap-Up Group:   The focus of this group is to help patients review their daily goal of treatment and discuss progress on daily workbooks.  Participation Level:  Active  Participation Quality:  Appropriate  Affect:  Appropriate  Cognitive:  Appropriate  Insight: Appropriate  Engagement in Group:  Engaged  Modes of Intervention:  Discussion  Additional Comments: The patient expressed that he rates today a 10.The patient also said that he attended groups.  Octavio Manns 05/12/2018, 8:47 PM

## 2018-05-12 NOTE — Progress Notes (Signed)
D: Pt denies SI/HI/AVH. Pt is pleasant and cooperative. Pt visible in dayroom interacting with a peer. Pt stated he met his goal for today and also met someone he could relate to.  A: Pt was offered support and encouragement. Pt was given scheduled medications. Pt was encourage to attend groups. Q 15 minute checks were done for safety.  R:Pt attends groups and interacts well with peers and staff. Pt is taking medication. Pt has no complaints.Pt receptive to treatment and safety maintained on unit.  Problem: Education: Goal: Emotional status will improve Outcome: Progressing   Problem: Education: Goal: Mental status will improve Outcome: Progressing   Problem: Activity: Goal: Sleeping patterns will improve Outcome: Progressing   Problem: Safety: Goal: Periods of time without injury will increase Outcome: Progressing   Problem: Education: Goal: Will be free of psychotic symptoms Outcome: Progressing   Problem: Coping: Goal: Coping ability will improve Outcome: Progressing   Problem: Coping: Goal: Will verbalize feelings Outcome: Progressing

## 2018-05-12 NOTE — [facility] Suicide Risk Assessment (Signed)
[facility] INPATIENT:  Family/Significant Other Suicide Prevention Education  Suicide Prevention Education:  Education Completed; Hansford Hirt, brother, [phone], has been identified by the patient as the family member/significant other with whom the patient will be residing, and identified as the person(s) who will aid the patient in the event of a mental health crisis (suicidal ideations/suicide attempt).  With written consent from the patient, the family member/significant other has been provided the following suicide prevention education, prior to the and/or following the discharge of the patient.  The suicide prevention education provided includes the following:  Suicide risk factors  Suicide prevention and interventions  National Suicide Hotline telephone number  [facility] assessment telephone number  [facility] Emergency Assistance 911  [facility] and/or Residential Mobile Crisis Unit telephone number  Request made of family/significant other to:  Remove weapons (e.g., guns, rifles, knives), all items previously/currently identified as safety concern.    Remove drugs/medications (over-the-counter, prescriptions, illicit drugs), all items previously/currently identified as a safety concern.  The family member/significant other verbalizes understanding of the suicide prevention education information provided.  The family member/significant other agrees to remove the items of safety concern listed above.  Brother very irritable, immediately frustrated upon initiating the phone call.  "He's going to do what he wants to do."  Pt lived with their mother his whole life until mother died in 05-Dec-2006.  Pt does not stay on his medications.  Onalee Hua used to stay in touch more, not so much lately but will try to be supportive.  Lorri Frederick, LCSW 05/12/2018, 10:48 AM

## 2018-05-12 NOTE — Progress Notes (Signed)
[facility] MD Progress Note  05/12/2018 4:19 PM Anthony Knox  MRN:  [id number] Subjective:    History as per intake: Anthony Knox is a 57 y/o M with history of schizoaffective disorder who was admitted on IVC from [facility] after he was found in his vehicle in the parking lot of a restaurant with a laceration to his scalp. Pt reported that he had fallen at home. He was a poor historian, disoriented and unable to provide recent narrative of events. Pt was medically cleared in the ED. He was placed on IVC after attempting to elope from the ED. He was transferred to [facility] for additional treatment and stabilization. During intake process at [facility], pt became acutely agitated and threw a chair, but he was able to be verbally de-escalated. Upon initial interview, pt shares, "They were giving me the runaraound; I felt trapped so I tried to walk off so I could get some fresh air." Pt was asked about events prior to coming to the hospital, and he shares, "I was feeling dizzy. I must have fallen. I woke up in all this blood." He does not recall being in his vehicle prior to admission and he is unable to provide a clear narrative of recent events prior to coming to the hospital. When asked with whom he lives, pt shares, "I live with my mother. I mean in spirit. I have her urn but it's too big - I don't think it's her." Pt denies mood problems aside from some mild depressed mood prior to admission. He denies SI/HI/AH/VH. He denies symptoms of mania, OCD, and PTSD. He denies all illicit substance use including alcohol and tobacco. When asked orientation questions, pt replies that the year is "2004" the month is "March" and his current location is [place]. Discussed with patient about treatment options. He currently takes no medications and he has not followed up on outpatient basis for "at least a year." He previously was with ACT team at [facility], and pt makes vague/paranoid reference regarding his care there, stating that  they were trying to hurt him. He reports previous trials of depakote (caused weight gain and increased SI), risperdal (caused cognitive dulling), and geodon (helpful for sleep). Pt reports he has a hard time with taking his medications. Discussed with patient about availability of medications with long-acting injection form so that he does not have to remember to take his medications daily, and pt expresses that he would be interested in trial of a medication that has a long-acting form. He is also concerned about weight gain. He is in agreement to trial of Invega with potential of transition to long-acting [place] if pt has good tolerability and efficacy. Pt was in agreement with the above plan, and he had no further questions, comments, or concerns.  As per evaluation today: Today upon evaluation, pt shares, "I'm doing good. I slept great - like a baby. Best sleep I've had in a long time." He denies any specific concerns. He is sleeping well. His appetite is good. He denies other physical complaints. He denies SI/HI/AH/VH. He is tolerating his medications well including tolerating long-acting injection of [place] well. He is in agreement to continue his current regimen without changes, and we will plan to administer booster injection of [place] tomorrow. Pt was in agreement with the above plan, and he had no further questions, comments, or concerns.  Principal Problem: Schizoaffective disorder (HCC) Diagnosis:   Patient Active Problem List   Diagnosis Date Noted  .  Schizoaffective disorder (HCC) [F25.9] 05/07/2018  . Psychosis (HCC) [F29] 05/06/2018  . Disorientation [R41.0]   . Scalp laceration [S01.01XA]   . ANKLE PAIN [M25.579] 11/13/2008   Total Time spent with patient: 30 minutes  Past Psychiatric History: see H&P  Past Medical History: History reviewed. No pertinent past medical history. History reviewed. No pertinent surgical history. Family History: History reviewed.  No pertinent family history. Family Psychiatric  History: see H&P Social History:  Social History   Substance and Sexual Activity  Alcohol Use Not on file     Social History   Substance and Sexual Activity  Drug Use Not on file    Social History   Socioeconomic History  . Marital status: Single    Spouse name: Not on file  . Number of children: Not on file  . Years of education: Not on file  . Highest education level: Not on file  Occupational History  . Not on file  Social Needs  . Financial resource strain: Not on file  . Food insecurity:    Worry: Not on file    Inability: Not on file  . Transportation needs:    Medical: Not on file    Non-medical: Not on file  Tobacco Use  . Smoking status: Unknown If Ever Smoked  Substance and Sexual Activity  . Alcohol use: Not on file  . Drug use: Not on file  . Sexual activity: Not on file  Lifestyle  . Physical activity:    Days per week: Not on file    Minutes per session: Not on file  . Stress: Not on file  Relationships  . Social connections:    Talks on phone: Not on file    Gets together: Not on file    Attends religious service: Not on file    Active member of club or organization: Not on file    Attends meetings of clubs or organizations: Not on file    Relationship status: Not on file  Other Topics Concern  . Not on file  Social History Narrative  . Not on file   Additional Social History:                         Sleep: Good  Appetite:  Good  Current Medications: Current Facility-Administered Medications  Medication Dose Route Frequency Provider Last Rate Last Dose  . acetaminophen (TYLENOL) tablet 650 mg  650 mg Oral Q6H PRN Micheal Likens, MD   650 mg at 05/10/18 0503  . alum & mag hydroxide-simeth (MAALOX/MYLANTA) 200-200-20 MG/5ML suspension 30 mL  30 mL Oral Q6H PRN Donell Sievert E, PA-C   30 mL at 05/10/18 2325  . feeding supplement (ENSURE ENLIVE) (ENSURE ENLIVE) liquid  237 mL  237 mL Oral BID BM Simon, Spencer E, PA-C   237 mL at 05/11/18 1451  . hydrOXYzine (ATARAX/VISTARIL) tablet 50 mg  50 mg Oral Q6H PRN Micheal Likens, MD   50 mg at 05/11/18 2148  . LORazepam (ATIVAN) tablet 2 mg  2 mg Oral Q6H PRN Micheal Likens, MD   2 mg at 05/11/18 2148   Or  . LORazepam (ATIVAN) injection 2 mg  2 mg Intramuscular Q6H PRN Micheal Likens, MD      . neomycin-bacitracin-polymyxin (NEOSPORIN) ointment   Topical PRN Micheal Likens, MD      . OLANZapine zydis (ZYPREXA) disintegrating tablet 5 mg  5 mg Oral Q8H PRN Micheal Likens,  MD       Or  . ziprasidone (GEODON) injection 20 mg  20 mg Intramuscular Q12H PRN Micheal Likens, MD      . [name] ON 05/13/2018] paliperidone (INVEGA SUSTENNA) injection 156 mg  156 mg Intramuscular Q28 days Micheal Likens, MD      . traZODone (DESYREL) tablet 100 mg  100 mg Oral QHS PRN,MR X 1 Kerry Hough, PA-C   100 mg at 05/11/18 2231    Lab Results: No results found for this or any previous visit (from the past 48 hour(s)).  Blood Alcohol level:  Lab Results  Component Value Date   ETH <10 05/05/2018   [facility]  07/13/2010    <5        LOWEST DETECTABLE LIMIT FOR SERUM ALCOHOL IS 5 mg/dL FOR MEDICAL PURPOSES ONLY    Metabolic Disorder Labs: Lab Results  Component Value Date   HGBA1C 5.9 (H) 05/08/2018   MPG 122.63 05/08/2018   Lab Results  Component Value Date   PROLACTIN 34.1 (H) 05/08/2018   Lab Results  Component Value Date   CHOL 141 05/08/2018   TRIG 91 05/08/2018   HDL 36 (L) 05/08/2018   CHOLHDL 3.9 05/08/2018   VLDL 18 05/08/2018   LDLCALC 87 05/08/2018    Physical Findings: AIMS: Facial and Oral Movements Muscles of Facial Expression: None, normal Lips and Perioral Area: None, normal Jaw: None, normal Tongue: None, normal,Extremity Movements Upper (arms, wrists, hands, fingers): None, normal Lower (legs, knees, ankles, toes): None,  normal, Trunk Movements Neck, shoulders, hips: None, normal, Overall Severity Severity of abnormal movements (highest score from questions above): None, normal Incapacitation due to abnormal movements: None, normal Patient's awareness of abnormal movements (rate only patient's report): No Awareness, Dental Status Current problems with teeth and/or dentures?: No Does patient usually wear dentures?: No  CIWA:  CIWA-Ar Total: 4 COWS:  COWS Total Score: 2  Musculoskeletal: Strength & Muscle Tone: within normal limits Gait & Station: normal Patient leans: N/A  Psychiatric Specialty Exam: Physical Exam  Nursing note and vitals reviewed.   Review of Systems  Constitutional: Negative for chills and fever.  Respiratory: Negative for cough and shortness of breath.   Cardiovascular: Negative for chest pain.  Gastrointestinal: Negative for abdominal pain, heartburn, nausea and vomiting.  Psychiatric/Behavioral: Negative for depression, hallucinations and suicidal ideas. The patient is not nervous/anxious and does not have insomnia.     Blood pressure 115/81, pulse 75, temperature 98 F (36.7 C), temperature source Oral, resp. rate 18, height 5\' 6"  (1.676 m), weight 74.8 kg, SpO2 100 %.Body mass index is 26.63 kg/m.  General Appearance: Casual and Fairly Groomed  Eye Contact:  Good  Speech:  Clear and Coherent and Normal Rate  Volume:  Normal  Mood:  Euthymic  Affect:  Blunt and Congruent  Thought Process:  Coherent and Goal Directed  Orientation:  Full (Time, Place, and Person)  Thought Content:  Logical  Suicidal Thoughts:  No  Homicidal Thoughts:  No  Memory:  Immediate;   Fair Recent;   Fair Remote;   Fair  Judgement:  Good  Insight:  Fair  Psychomotor Activity:  Normal  Concentration:  Concentration: Fair  Recall:  [employer] of Knowledge:  Fair  Language:  Fair  Akathisia:  No  Handed:    AIMS (if indicated):     Assets:  Resilience Social Support  ADL's:  Intact   Cognition:  WNL  Sleep:  Number of Hours: 6.75  Treatment Plan Summary: Daily contact with patient to assess and evaluate symptoms and progress in treatment and Medication management   -Continue inpatient hospitalization  -Schizoaffective disorder, unspecified             -Continue [place]. Invega Sustenna 234mg  IM once administered 05/11/18. Plan for Invega Sustenna 156mg  IM q28 days starting 05/13/18.  -anxiety             -Continue vistaril 50mg  po q6h prn anxiety  -agitation             -Continue zydis 5mg  po q8h prn agitation             -Continue geodon 20mg  IM q12h prn severe agitation             -Continue ativan 2mg  po/IM q6h prn agitation  -insomnia             -Continue trazodone 100mg  po qhs prn insomnia (may repeat x1 prn insomnia)  -Encourage participation in groups and therapeutic milieu  -disposition planning will be ongoing   Micheal Likens, MD 05/12/2018, 4:19 PM

## 2018-05-12 NOTE — Progress Notes (Signed)
Patient has been isolative to the room the shift.  Patient denies SI, HI and AVH.  Patient has not attended groups this shift.   Patient has been compliant with medications.   Assess patient for safety, offer medications as prescribed, engage patient in 1:1 staff talks.   Continue to monitor as planned.  Patient able to contract for safety.

## 2018-05-12 NOTE — Progress Notes (Signed)
Recreation Therapy Notes  Date: 10.30.19 Time: 1000 Location: 500 Hall Dayroom   Group Topic: Stress Management  Goal Area(s) Addresses:  Patient will verbalize importance of using healthy stress management.  Patient will identify positive emotions associated with healthy stress management.   Behavioral Response: None  Intervention: Stress Management  Activity :  Guided Imagery & Meditation.  LRT introduced the stress management techniques of guided imagery and meditation.  LRT read a script for patients to envision themselves out looking at the starry sky at night.  LRT then played a meditation to help patients build up their resilience to uncomfortable situations.  Education: Stress Management, Discharge Planning.   Education Outcome: Acknowledges edcuation/In group clarification offered/Needs additional education  Clinical Observations/Feedback: Pt was unable to sit still and focus.  Pt was playing with a deck of cards throughout the group.  Pt seemed to be in his own world but did appear to distract his peers.    Caroll Rancher, LRT/CTRS        Caroll Rancher A 05/12/2018 12:02 PM

## 2018-05-12 NOTE — [facility] Group Notes (Signed)
LCSW Group Therapy Note  05/12/2018 9:29 AM  Type of Therapy/Topic:  Group Therapy:  Emotion Regulation  Participation Level:  None   Description of Group:   The purpose of this group is to assist patients in learning to regulate negative emotions and experience positive emotions. Patients will be guided to discuss ways in which they have been vulnerable to their negative emotions. These vulnerabilities will be juxtaposed with experiences of positive emotions or situations, and patients will be challenged to use positive emotions to combat negative ones. Special emphasis will be placed on coping with negative emotions in conflict situations, and patients will process healthy conflict resolution skills.  Therapeutic Goals: 1. Patient will identify two positive emotions or experiences to reflect on in order to balance out   negative emotions 2. Patient will label two or more emotions that they find the most difficult to experience 3. Patient will demonstrate positive conflict resolution skills through discussion and/or role plays  Summary of Patient Progress:  Anthony Knox attended the entire session. He was uninterested in the group. Anthony Knox did not engage with the activity or discussion.   Therapeutic Modalities:   Cognitive Behavioral Therapy Feelings Identification Dialectical Behavioral Therapy   Marian Sorrow MSW Intern 05/12/2018 9:29 AM

## 2018-05-12 NOTE — Progress Notes (Signed)
D: Pt denies SI/HI/AVH. Pt is pleasant and cooperative. Pt visible in the dayroom keeping to himself, pt will brighten on engagement and will respond appropriately most of the time this evening.   A: Pt was offered support and encouragement. Pt was given scheduled medications. Pt was encourage to attend groups. Q 15 minute checks were done for safety.   R:Pt attends groups and interacts well with peers and staff. Pt is taking medication. Pt has no complaints.Pt receptive to treatment and safety maintained on unit.  Problem: Activity: Goal: Interest or engagement in activities will improve Outcome: Progressing   Problem: Activity: Goal: Sleeping patterns will improve Outcome: Progressing   Problem: Coping: Goal: Ability to demonstrate self-control will improve Outcome: Progressing

## 2018-05-13 NOTE — [facility] Group Notes (Signed)
[facility] LCSW Group Therapy Note  Date/Time: 05/13/18, 1345  Type of Therapy/Topic:  Group Therapy:  Balance in Life  Participation Level:  minimal  Description of Group:    This group will address the concept of balance and how it feels and looks when one is unbalanced. Patients will be encouraged to process areas in their lives that are out of balance, and identify reasons for remaining unbalanced. Facilitators will guide patients utilizing problem- solving interventions to address and correct the stressor making their life unbalanced. Understanding and applying boundaries will be explored and addressed for obtaining  and maintaining a balanced life. Patients will be encouraged to explore ways to assertively make their unbalanced needs known to significant others in their lives, using other group members and facilitator for support and feedback.  Therapeutic Goals: 1. Patient will identify two or more emotions or situations they have that consume much of in their lives. 2. Patient will identify signs/triggers that life has become out of balance:  3. Patient will identify two ways to set boundaries in order to achieve balance in their lives:  4. Patient will demonstrate ability to communicate their needs through discussion and/or role plays  Summary of Patient Progress:Pt mostly quiet during group discussion.  Pt did interact with CSW when CSW directed several questions to him.  Pt identified mental/emotional and friends as areas that are out of balance in his life.  Pt had [employer] cards on the table in front of him and was more interested in the cards than the group discussion.           Therapeutic Modalities:   Cognitive Behavioral Therapy Solution-Focused Therapy Assertiveness Training  Daleen Squibb, [place]

## 2018-05-13 NOTE — Progress Notes (Signed)
CSW spoke to pt for second time regarding follow up.  Pt reports that he had a bad experience with [facility] and that he does not want to restart services with them.  Discussed [facility] and [place].  Dr Altamese Fairton does not think pt needs referral back to ACT team. Garner Nash, MSW, LCSW Clinical Social Worker 05/13/2018 10:56 AM

## 2018-05-13 NOTE — Progress Notes (Signed)
Recreation Therapy Notes  Date: 10.31.19 Time: 1000 Location: 500 Hall Dayroom   Group Topic: Communication, Team Building, Problem Solving  Goal Area(s) Addresses:  Patient will effectively work with peer towards shared goal.  Patient will identify skill used to make activity successful.  Patient will identify how skills used during activity can be used to reach post d/c goals.   Behavioral Response: Engaged  Intervention: STEM Activity   Activity: [employer]. Patients were provided the following materials: 5 drinking straws, 5 rubber bands, 5 paper clips, 2 index cards, 2 drinking cups, and 2 toilet paper rolls. Using the provided materials patients were asked to build a launching mechanisms to launch a ping pong ball approximately 12 feet. Patients were divided into teams of 3-5.   Education: Pharmacist, community, Building control surveyor.   Education Outcome: Acknowledges education/In group clarification offered/Needs additional education.   Clinical Observations/Feedback: Pt seemed to be a little confused about the concept of the activity.  Pt worked well with peers.  Pt gave suggestions to his peers and was able to focus on the activity.    Caroll Rancher, LRT/CTRS      Caroll Rancher A 05/13/2018 11:41 AM

## 2018-05-13 NOTE — Progress Notes (Signed)
Did not attend group 

## 2018-05-13 NOTE — Progress Notes (Signed)
D: Pt denies SI/HI/AVH. Pt is pleasant and cooperative. Pt visible on the unit but keeps to himself, but will talk when engaged. Pt  Still disorganized at times.  A: Pt was offered support and encouragement. Pt was given scheduled medications. Pt was encourage to attend groups. Q 15 minute checks were done for safety.   R:Pt attends groups and interacts well with peers and staff. Pt is taking medication. Pt has no complaints.Pt receptive to treatment and safety maintained on unit.   Problem: Education: Goal: Emotional status will improve Outcome: Progressing   Problem: Activity: Goal: Sleeping patterns will improve Outcome: Progressing   Problem: Coping: Goal: Ability to verbalize frustrations and anger appropriately will improve Outcome: Progressing   Problem: Coping: Goal: Ability to demonstrate self-control will improve Outcome: Progressing

## 2018-05-13 NOTE — Progress Notes (Signed)
[facility] MD Progress Note  05/13/2018 3:54 PM Anthony Knox  MRN:  [id number]  Subjective: Anthony Knox reports, "I'm doing good. I got shot a couple days ago. I'm doing well on it. You are doing well for me so, I will call you a doctor. When I start talking about doctors & lawyers, I get angry because they are a scam. People with pre-existing condition suffer as a result. I did not sleep at all last night. The staff keep checking on me. I heard voices laughing, but it was a fake laugh. I remember this because I have a photographic memory".   History as per intake: Anthony Knox is a 57 y/o M with history of schizoaffective disorder who was [facility] from [facility] after he was found in his vehicle in the parking lot of a restaurant with a laceration to his scalp. Pt reported that he had fallen at home. He was a poor historian, disoriented and unable to provide recent narrative of events. Pt was medically cleared in the ED. He was placed on IVC after attempting to elope from the ED. He was transferred to [facility] for additional treatment and stabilization. During intake process at [facility], pt became acutely agitated and threw a chair, but he was able to be verbally de-escalated. Upon initial interview, pt shares, "They were giving me the runaraound; I felt trapped so I tried to walk off so I could get some fresh air." Pt was asked about events prior to coming to the hospital, and he shares, "I was feeling dizzy. I must have fallen. I woke up in all this blood." He does not recall being in his vehicle prior to admission and he is unable to provide a clear narrative of recent events prior to coming to the hospital. When asked with whom he lives, pt shares, "I live with my mother. I mean in spirit. I have her urn but it's too big - I don't think it's her." Pt denies mood problems aside from some mild depressed mood prior to admission. He denies SI/HI/AH/VH. He denies symptoms of mania, OCD, and PTSD. He denies all illicit  substance use including alcohol and tobacco. When asked orientation questions, pt replies that the year is "2004" the month is "March" and his current location is [place]. Discussed with patient about treatment options. He currently takes no medications and he has not followed up on outpatient basis for "at least a year." He previously was with ACT team at [facility], and pt makes vague/paranoid reference regarding his care there, stating that they were trying to hurt him. He reports previous trials of depakote (caused weight gain and increased SI), risperdal (caused cognitive dulling), and geodon (helpful for sleep). Pt reports he has a hard time with taking his medications. Discussed with patient about availability of medications with long-acting injection form so that he does not have to remember to take his medications daily, and pt expresses that he would be interested in trial of a medication that has a long-acting form. He is also concerned about weight gain. He is in agreement to trial of Invega with potential of transition to long-acting [place] if pt has good tolerability and efficacy. Pt was in agreement with the above plan, and he had no further questions, comments, or concerns.  As per evaluation today: Today upon evaluation, pt shares, "I'm doing good. But, I did not sleep last night. The staff keep checking on me. I heard voices laughing, but it was a  fake laugh. I remember this because I have a photographic memory". He denies any other specific concerns. His appetite is good. He denies other physical complaints. He denies SI/HI/AH/VH. He is tolerating his medications well including the long-acting injection of [place] well. He is in agreement to continue his current regimen without changes, and we will plan to administer booster injection of Sustenna sometime today 05-13-18. Pt was in agreement with the above plan, and he had no further questions, comments, or  concerns.  Principal Problem: Schizoaffective disorder (HCC) Diagnosis:   Patient Active Problem List   Diagnosis Date Noted  . Schizoaffective disorder (HCC) [F25.9] 05/07/2018  . Psychosis (HCC) [F29] 05/06/2018  . Disorientation [R41.0]   . Scalp laceration [S01.01XA]   . ANKLE PAIN [M25.579] 11/13/2008   Total Time spent with patient: 15 minutes  Past Psychiatric History: See H&P  Past Medical History: History reviewed. No pertinent past medical history. History reviewed. No pertinent surgical history.  Family History: History reviewed. No pertinent family history.  Family Psychiatric  History: See H&P  Social History:  Social History   Substance and Sexual Activity  Alcohol Use Not on file     Social History   Substance and Sexual Activity  Drug Use Not on file    Social History   Socioeconomic History  . Marital status: Single    Spouse name: Not on file  . Number of children: Not on file  . Years of education: Not on file  . Highest education level: Not on file  Occupational History  . Not on file  Social Needs  . Financial resource strain: Not on file  . Food insecurity:    Worry: Not on file    Inability: Not on file  . Transportation needs:    Medical: Not on file    Non-medical: Not on file  Tobacco Use  . Smoking status: Unknown If Ever Smoked  Substance and Sexual Activity  . Alcohol use: Not on file  . Drug use: Not on file  . Sexual activity: Not on file  Lifestyle  . Physical activity:    Days per week: Not on file    Minutes per session: Not on file  . Stress: Not on file  Relationships  . Social connections:    Talks on phone: Not on file    Gets together: Not on file    Attends religious service: Not on file    Active member of club or organization: Not on file    Attends meetings of clubs or organizations: Not on file    Relationship status: Not on file  Other Topics Concern  . Not on file  Social History Narrative  . Not on  file   Additional Social History:   Sleep: Good  Appetite:  Good  Current Medications: Current Facility-Administered Medications  Medication Dose Route Frequency Provider Last Rate Last Dose  . acetaminophen (TYLENOL) tablet 650 mg  650 mg Oral Q6H PRN Micheal Likens, MD   650 mg at 05/10/18 0503  . alum & mag hydroxide-simeth (MAALOX/MYLANTA) 200-200-20 MG/5ML suspension 30 mL  30 mL Oral Q6H PRN Donell Sievert E, PA-C   30 mL at 05/10/18 2325  . feeding supplement (ENSURE ENLIVE) (ENSURE ENLIVE) liquid 237 mL  237 mL Oral BID BM Simon, Spencer E, PA-C   237 mL at 05/11/18 1451  . hydrOXYzine (ATARAX/VISTARIL) tablet 50 mg  50 mg Oral Q6H PRN Micheal Likens, MD   50 mg at  05/12/18 2151  . LORazepam (ATIVAN) tablet 2 mg  2 mg Oral Q6H PRN Micheal Likens, MD   2 mg at 05/12/18 2151   Or  . LORazepam (ATIVAN) injection 2 mg  2 mg Intramuscular Q6H PRN Micheal Likens, MD      . neomycin-bacitracin-polymyxin (NEOSPORIN) ointment   Topical PRN Micheal Likens, MD      . OLANZapine zydis (ZYPREXA) disintegrating tablet 5 mg  5 mg Oral Q8H PRN Micheal Likens, MD   5 mg at 05/13/18 0235   Or  . ziprasidone (GEODON) injection 20 mg  20 mg Intramuscular Q12H PRN Micheal Likens, MD      . paliperidone (INVEGA SUSTENNA) injection 156 mg  156 mg Intramuscular Q28 days Micheal Likens, MD      . traZODone (DESYREL) tablet 100 mg  100 mg Oral QHS PRN,MR X 1 Kerry Hough, PA-C   100 mg at 05/12/18 2152   Lab Results: No results found for this or any previous visit (from the past 48 hour(s)).  Blood Alcohol level:  Lab Results  Component Value Date   ETH <10 05/05/2018   [facility]  07/13/2010    <5        LOWEST DETECTABLE LIMIT FOR SERUM ALCOHOL IS 5 mg/dL FOR MEDICAL PURPOSES ONLY    Metabolic Disorder Labs: Lab Results  Component Value Date   HGBA1C 5.9 (H) 05/08/2018   MPG 122.63 05/08/2018   Lab Results   Component Value Date   PROLACTIN 34.1 (H) 05/08/2018   Lab Results  Component Value Date   CHOL 141 05/08/2018   TRIG 91 05/08/2018   HDL 36 (L) 05/08/2018   CHOLHDL 3.9 05/08/2018   VLDL 18 05/08/2018   LDLCALC 87 05/08/2018   Physical Findings: AIMS: Facial and Oral Movements Muscles of Facial Expression: None, normal Lips and Perioral Area: None, normal Jaw: None, normal Tongue: None, normal,Extremity Movements Upper (arms, wrists, hands, fingers): None, normal Lower (legs, knees, ankles, toes): None, normal, Trunk Movements Neck, shoulders, hips: None, normal, Overall Severity Severity of abnormal movements (highest score from questions above): None, normal Incapacitation due to abnormal movements: None, normal Patient's awareness of abnormal movements (rate only patient's report): No Awareness, Dental Status Current problems with teeth and/or dentures?: No Does patient usually wear dentures?: No  CIWA:  CIWA-Ar Total: 4 COWS:  COWS Total Score: 2  Musculoskeletal: Strength & Muscle Tone: within normal limits Gait & Station: normal Patient leans: N/A  Psychiatric Specialty Exam: Physical Exam  Nursing note and vitals reviewed.   Review of Systems  Constitutional: Negative for chills and fever.  Respiratory: Negative for cough and shortness of breath.   Cardiovascular: Negative for chest pain.  Gastrointestinal: Negative for abdominal pain, heartburn, nausea and vomiting.  Psychiatric/Behavioral: Negative for depression, hallucinations and suicidal ideas. The patient is not nervous/anxious and does not have insomnia.     Blood pressure 115/81, pulse 75, temperature 98 F (36.7 C), temperature source Oral, resp. rate 18, height 5\' 6"  (1.676 m), weight 74.8 kg, SpO2 100 %.Body mass index is 26.63 kg/m.  General Appearance: Casual and Fairly Groomed  Eye Contact:  Good  Speech:  Clear and Coherent and Normal Rate  Volume:  Normal  Mood:  Euthymic  Affect:  Blunt  and Congruent  Thought Process:  Coherent and Goal Directed  Orientation:  Full (Time, Place, and Person)  Thought Content:  Logical  Suicidal Thoughts:  No  Homicidal Thoughts:  No  Memory:  Immediate;   Fair Recent;   Fair Remote;   Fair  Judgement:  Good  Insight:  Fair  Psychomotor Activity:  Normal  Concentration:  Concentration: Fair  Recall:  [employer] of Knowledge:  Fair  Language:  Fair  Akathisia:  No  Handed:    AIMS (if indicated):     Assets:  Resilience Social Support  ADL's:  Intact  Cognition:  WNL  Sleep:  Number of Hours: 3   Treatment Plan Summary: Daily contact with patient to assess and evaluate symptoms and progress in treatment and Medication management   -Continue inpatient hospitalization.  -Will continue today 05/13/2018 plan as below except where it is noted.  -Schizoaffective disorder, unspecified             -Continue [place]. Invega Sustenna 234mg  IM once administered 05/11/18. Plan for Gean [place] 156mg  IM q28 days starting today 05/13/18.  -anxiety             -Continue vistaril 50mg  po q6h prn anxiety  -agitation             -Continue zydis 5mg  po q8h prn agitation             -Continue geodon 20mg  IM q12h prn severe agitation             -Continue ativan 2mg  po/IM q6h prn agitation  -insomnia             -Continue trazodone 100mg  po qhs prn insomnia (may repeat x1 prn insomnia)  -Encourage participation in groups and therapeutic milieu  -disposition planning will be ongoing  Armandina Stammer, NP, PMHNP, FNP-BC 05/13/2018, 3:54 PMPatient ID: Helene Kelp, male   DOB: 05-18-1961, 57 y.o.   MRN: [id number]

## 2018-05-13 NOTE — Progress Notes (Signed)
Patient has been isolative to the room the shift.  Patient denies SI, HI and AVH.  Patient has not attended groups this shift.   Patient has been compliant with medications.   Assess patient for safety, offer medications as prescribed, engage patient in 1:1 staff talks.   Continue to monitor as planned.  Patient able to contract for safety.  

## 2018-05-14 MED ORDER — TRAZODONE HCL 100 MG PO TABS
100.0000 mg | ORAL_TABLET | Freq: Every evening | ORAL | Status: DC | PRN
  Filled 2018-05-14: qty 7

## 2018-05-14 MED ORDER — PALIPERIDONE PALMITATE ER 156 MG/ML IM SUSY: 156.0000 mg | PREFILLED_SYRINGE | INTRAMUSCULAR | 0 refills | Status: DC

## 2018-05-14 MED ORDER — HYDROXYZINE HCL 50 MG PO TABS: 50.0000 mg | ORAL_TABLET | Freq: Four times a day (QID) | ORAL | 0 refills | Status: DC | PRN

## 2018-05-14 MED ORDER — TRAZODONE HCL 100 MG PO TABS: 100.0000 mg | ORAL_TABLET | Freq: Every evening | ORAL | 0 refills | Status: DC | PRN

## 2018-05-14 NOTE — [facility] Suicide Risk Assessment (Signed)
[facility] Discharge Suicide Risk Assessment   Principal Problem: Schizoaffective disorder [facility]) Discharge Diagnoses:  Patient Active Problem List   Diagnosis Date Noted  . Schizoaffective disorder (HCC) [F25.9] 05/07/2018  . Psychosis (HCC) [F29] 05/06/2018  . Disorientation [R41.0]   . Scalp laceration [S01.01XA]   . ANKLE PAIN [M25.579] 11/13/2008    Total Time spent with patient: 30 minutes  Psychiatric Specialty Exam:   Blood pressure 115/81, pulse 75, temperature 98 F (36.7 C), temperature source Oral, resp. rate 18, height 5\' 6"  (1.676 m), weight 74.8 kg, SpO2 100 %.Body mass index is 26.63 kg/m.   Mental Status Per Nursing Assessment::   On Admission:  NA  Demographic Factors:  Male, Caucasian, Low socioeconomic status, Living alone and Unemployed  Loss Factors: Financial problems/change in socioeconomic status  Historical Factors: Impulsivity  Risk Reduction Factors:   Positive social support, Positive therapeutic relationship and Positive coping skills or problem solving skills  Continued Clinical Symptoms:  Schizophrenia:   Paranoid or undifferentiated type  Cognitive Features That Contribute To Risk:  None    Suicide Risk:  Minimal: No identifiable suicidal ideation.  Patients presenting with no risk factors but with morbid ruminations; may be classified as minimal risk based on the severity of the depressive symptoms  Follow-up Information    [facility] Go in 3 day(s).   Specialty:  Professional Counselor Why:  Please attend a walk in appt Monday-Friday between 8:30am-12noon or 1pm-2:30pm.  Please request therapy and medication management services.  Contact information: [facility] [address] [place] [address] [phone]           Plan Of Care/Follow-up recommendations:  Activity:  as tolerated Diet:  normal Tests:  NA Other:  see above for DC plan  Micheal Likens,  MD 05/14/2018, 8:25 AM

## 2018-05-14 NOTE — Plan of Care (Signed)
Pt attended and engaged in groups in a calm and appropriate mood at completion of recreation therapy group sessions.   Caroll Rancher, LRT/CTRS

## 2018-05-14 NOTE — Plan of Care (Signed)
Discharge Note  Patient verbalizes readiness for discharge. Follow up plan explained, AVS, Transition record and SRA given. Prescriptions and teaching provided. Belongings returned and signed for. Suicide safety plan completed and signed. Patient verbalizes understanding. Patient denies SI/HI and assures this Clinical research associate he will seek assistance should that change. Patient discharged to lobby where he is waiting for [employer] taxi service.  Problem: Education: Goal: Knowledge of [facility] Education information/materials will improve Outcome: Adequate for Discharge Goal: Emotional status will improve Outcome: Adequate for Discharge Goal: Mental status will improve Outcome: Adequate for Discharge Goal: Verbalization of understanding the information provided will improve Outcome: Adequate for Discharge   Problem: Activity: Goal: Interest or engagement in activities will improve Outcome: Adequate for Discharge Goal: Sleeping patterns will improve Outcome: Adequate for Discharge   Problem: Coping: Goal: Ability to verbalize frustrations and anger appropriately will improve Outcome: Adequate for Discharge Goal: Ability to demonstrate self-control will improve Outcome: Adequate for Discharge   Problem: Health Behavior/Discharge Planning: Goal: Identification of resources available to assist in meeting health care needs will improve Outcome: Adequate for Discharge Goal: Compliance with treatment plan for underlying cause of condition will improve Outcome: Adequate for Discharge   Problem: Physical Regulation: Goal: Ability to maintain clinical measurements within normal limits will improve Outcome: Adequate for Discharge   Problem: Safety: Goal: Periods of time without injury will increase Outcome: Adequate for Discharge   Problem: Activity: Goal: Will verbalize the importance of balancing activity with adequate rest periods Outcome: Adequate for Discharge   Problem:  Education: Goal: Will be free of psychotic symptoms Outcome: Adequate for Discharge Goal: Knowledge of the prescribed therapeutic regimen will improve Outcome: Adequate for Discharge   Problem: Coping: Goal: Coping ability will improve Outcome: Adequate for Discharge Goal: Will verbalize feelings Outcome: Adequate for Discharge   Problem: Health Behavior/Discharge Planning: Goal: Compliance with prescribed medication regimen will improve Outcome: Adequate for Discharge   Problem: Nutritional: Goal: Ability to achieve adequate nutritional intake will improve Outcome: Adequate for Discharge   Problem: Role Relationship: Goal: Ability to communicate needs accurately will improve Outcome: Adequate for Discharge Goal: Ability to interact with others will improve Outcome: Adequate for Discharge   Problem: Safety: Goal: Ability to redirect hostility and anger into socially appropriate behaviors will improve Outcome: Adequate for Discharge Goal: Ability to remain free from injury will improve Outcome: Adequate for Discharge   Problem: Self-Care: Goal: Ability to participate in self-care as condition permits will improve Outcome: Adequate for Discharge   Problem: Self-Concept: Goal: Will verbalize positive feelings about self Outcome: Adequate for Discharge

## 2018-05-14 NOTE — [facility] Group Notes (Signed)
Date: 05/14/18, 1315  Type of Therapy and Topic: Chaplain group, "Hope is.." Chaplain engaged group in discussion about hope and what it looks like in each patients life and current situation.  Participation level:minimal  Modes of Intervention: Discussion, Education and Socialization  Summary of Progress/Problems:Pt shared that "hope is irregularity" and engaged briefly in discussion about this with chaplain.    Pt also engaged chaplain in discussion about a picture that he chose as a visual representation of hope: a speedometer, which he said was hopeful because it represented progress.     Lorri Frederick, LCSW 05/14/2018 1:36 PM  [facility] LCSW Group Therapy Note

## 2018-05-14 NOTE — Progress Notes (Signed)
Recreation Therapy Notes  INPATIENT RECREATION TR PLAN  Patient Details Name: Anthony Knox MRN: [id number] DOB: [birth year] Today's Date: 05/14/2018  Rec Therapy Plan Is patient appropriate for Therapeutic Recreation?: Yes Treatment times per week: about 3 days  Estimated Length of Stay: 5-7 days TR Treatment/Interventions: Group participation (Comment)  Discharge Criteria Pt will be discharged from therapy if:: Discharged Treatment plan/goals/alternatives discussed and agreed upon by:: Patient/family  Discharge Summary Short term goals set: See patient care plan Short term goals met: Complete Progress toward goals comments: Groups attended Which groups?: Stress management, Leisure education, Goal setting, Other (Comment)(Team building) Reason goals not met: None Therapeutic equipment acquired: N/A Reason patient discharged from therapy: Discharge from hospital Pt/family agrees with progress & goals achieved: Yes Date patient discharged from therapy: 05/14/18    Victorino Sparrow, LRT/CTRS  Ria Comment, Ayjah Show A 05/14/2018, 10:53 AM
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
## 2018-05-14 NOTE — Progress Notes (Signed)
Recreation Therapy Notes  Date: 11.1.19 Time: 1000 Location: 500 Hall  Group Topic: Communication  Goal Area(s) Addresses:  Patient will effectively communicate with peers in group.  Patient will effectively communicate to achieve shared group goal. Patient will identify techniques that made activity effective for group.   Intervention: Round rubber discs  Activity: Sharks in [employer].  Each person was given a rubber disc and one disc was given to the group.  Patients were to use the discs to move the group from the starting point to the end of the hall and back.  If anyone stepped off their disc, the group would have to start over.  Education: Communication, Discharge Planning  Education Outcome: Acknowledges understanding/In group clarification offered/Needs additional education.   Clinical Observations/Feedback: Pt did not attend group.    Caroll Rancher, LRT/CTRS         Caroll Rancher A 05/14/2018 11:23 AM

## 2018-06-18 ENCOUNTER — Emergency Department (HOSPITAL_COMMUNITY)
Admission: EM | Admit: 2018-06-18 | Discharge: 2018-06-18 | Disposition: A | Discharge status: Other Acute Inpatient Hospital | Payer: Medicare Other | Attending: Emergency Medicine | Admitting: Emergency Medicine

## 2018-06-18 ENCOUNTER — Other Ambulatory Visit: Payer: Self-pay

## 2018-06-18 ENCOUNTER — Encounter (HOSPITAL_COMMUNITY): Payer: Self-pay | Admitting: Emergency Medicine

## 2018-06-18 ENCOUNTER — Encounter (HOSPITAL_COMMUNITY): Payer: Self-pay

## 2018-06-18 ENCOUNTER — Inpatient Hospital Stay (HOSPITAL_COMMUNITY)
Admission: AD | Admit: 2018-06-18 | Discharge: 2018-06-24 | DRG: 885 | Disposition: A | Discharge status: Home / Self Care | Payer: Medicare Other | Source: Intra-hospital | Attending: Psychiatry | Admitting: Psychiatry

## 2018-06-18 DIAGNOSIS — F332 Major depressive disorder, recurrent severe without psychotic features: Secondary | ICD-10-CM | POA: Diagnosis present

## 2018-06-18 DIAGNOSIS — F250 Schizoaffective disorder, bipolar type: Secondary | ICD-10-CM | POA: Diagnosis not present

## 2018-06-18 DIAGNOSIS — Z62810 Personal history of physical and sexual abuse in childhood: Secondary | ICD-10-CM | POA: Insufficient documentation

## 2018-06-18 DIAGNOSIS — G4700 Insomnia, unspecified: Secondary | ICD-10-CM | POA: Diagnosis present

## 2018-06-18 DIAGNOSIS — Z9119 Patient's noncompliance with other medical treatment and regimen: Secondary | ICD-10-CM

## 2018-06-18 DIAGNOSIS — F259 Schizoaffective disorder, unspecified: Secondary | ICD-10-CM | POA: Diagnosis present

## 2018-06-18 DIAGNOSIS — Z23 Encounter for immunization: Secondary | ICD-10-CM

## 2018-06-18 DIAGNOSIS — Z79899 Other long term (current) drug therapy: Secondary | ICD-10-CM | POA: Insufficient documentation

## 2018-06-18 DIAGNOSIS — M545 Low back pain: Secondary | ICD-10-CM | POA: Insufficient documentation

## 2018-06-18 DIAGNOSIS — R410 Disorientation, unspecified: Secondary | ICD-10-CM | POA: Diagnosis present

## 2018-06-18 DIAGNOSIS — F333 Major depressive disorder, recurrent, severe with psychotic symptoms: Secondary | ICD-10-CM | POA: Diagnosis not present

## 2018-06-18 DIAGNOSIS — F29 Unspecified psychosis not due to a substance or known physiological condition: Secondary | ICD-10-CM

## 2018-06-18 LAB — RAPID URINE DRUG SCREEN, HOSP PERFORMED
Amphetamines: NOT DETECTED
BARBITURATES: NOT DETECTED
BENZODIAZEPINES: NOT DETECTED
COCAINE: NOT DETECTED
OPIATES: NOT DETECTED
Tetrahydrocannabinol: NOT DETECTED

## 2018-06-18 LAB — ETHANOL: Alcohol, Ethyl (B): <10 mg/dL (ref <10)

## 2018-06-18 LAB — URINALYSIS, ROUTINE W REFLEX MICROSCOPIC
Bilirubin Urine: NEGATIVE
GLUCOSE, UA: NEGATIVE mg/dL
Hgb urine dipstick: NEGATIVE
Ketones, ur: NEGATIVE mg/dL
Leukocytes, UA: NEGATIVE
Nitrite: NEGATIVE
PH: 5 (ref 5.0–8.0)
Protein, ur: NEGATIVE mg/dL
SPECIFIC GRAVITY, URINE: 1.014 (ref 1.005–1.030)

## 2018-06-18 LAB — CBC WITH DIFFERENTIAL/PLATELET
ABS IMMATURE GRANULOCYTES: 0.01 10*3/uL (ref 0.00–0.07)
BASOS PCT: 1 %
Basophils Absolute: 0.1 10*3/uL (ref 0.0–0.1)
Eosinophils Absolute: 0 10*3/uL (ref 0.0–0.5)
Eosinophils Relative: 1 %
HEMATOCRIT: 46.7 % (ref 39.0–52.0)
HEMOGLOBIN: 15.5 g/dL (ref 13.0–17.0)
Immature Granulocytes: 0 %
LYMPHS ABS: 2.3 10*3/uL (ref 0.7–4.0)
LYMPHS PCT: 38 %
MCH: 30 pg (ref 26.0–34.0)
MCHC: 33.2 g/dL (ref 30.0–36.0)
MCV: 90.5 fL (ref 80.0–100.0)
MONO ABS: 0.8 10*3/uL (ref 0.1–1.0)
MONOS PCT: 13 %
NEUTROS ABS: 2.9 10*3/uL (ref 1.7–7.7)
Neutrophils Relative %: 47 %
Platelets: 306 10*3/uL (ref 150–400)
RBC: 5.16 MIL/uL (ref 4.22–5.81)
RDW: 14.1 % (ref 11.5–15.5)
WBC: 6.1 10*3/uL (ref 4.0–10.5)
nRBC: 0 % (ref 0.0–0.2)

## 2018-06-18 LAB — COMPREHENSIVE METABOLIC PANEL
ALBUMIN: 4.1 g/dL (ref 3.5–5.0)
ALK PHOS: 54 U/L (ref 38–126)
ALT: 38 U/L (ref 0–44)
AST: 28 U/L (ref 15–41)
Anion gap: 10 (ref 5–15)
BILIRUBIN TOTAL: 0.7 mg/dL (ref 0.3–1.2)
BUN: 13 mg/dL (ref 6–20)
CO2: 24 mmol/L (ref 22–32)
Calcium: 9.1 mg/dL (ref 8.9–10.3)
Chloride: 104 mmol/L (ref 98–111)
Creatinine, Ser: 0.79 mg/dL (ref 0.61–1.24)
GFR calc Af Amer: >60 mL/min (ref >60)
GFR calc non Af Amer: >60 mL/min (ref >60)
GLUCOSE: 112 mg/dL — AB (ref 70–99)
POTASSIUM: 4 mmol/L (ref 3.5–5.1)
Sodium: 138 mmol/L (ref 135–145)
Total Protein: 6.7 g/dL (ref 6.5–8.1)

## 2018-06-18 LAB — SALICYLATE LEVEL: Salicylate Lvl: <7 mg/dL (ref 2.8–30.0)

## 2018-06-18 LAB — ACETAMINOPHEN LEVEL: Acetaminophen (Tylenol), Serum: <10 ug/mL — ABNORMAL LOW (ref 10–30)

## 2018-06-18 MED ORDER — RISPERIDONE 2 MG PO TABS
2.0000 mg | ORAL_TABLET | Freq: Two times a day (BID) | ORAL | Status: DC
  Administered 2018-06-18 – 2018-06-21 (×6): 2 mg via ORAL
  Filled 2018-06-18 (×10): qty 1

## 2018-06-18 MED ORDER — INFLUENZA VAC SPLIT QUAD 0.5 ML IM SUSY
0.5000 mL | PREFILLED_SYRINGE | INTRAMUSCULAR | Status: AC
  Administered 2018-06-19: 0.5 mL via INTRAMUSCULAR
  Filled 2018-06-18: qty 0.5

## 2018-06-18 MED ORDER — PNEUMOCOCCAL VAC POLYVALENT 25 MCG/0.5ML IJ INJ
0.5000 mL | INJECTION | INTRAMUSCULAR | Status: AC
  Administered 2018-06-19: 0.5 mL via INTRAMUSCULAR

## 2018-06-18 MED ORDER — BENZTROPINE MESYLATE 0.5 MG PO TABS
0.5000 mg | ORAL_TABLET | Freq: Two times a day (BID) | ORAL | Status: DC
  Administered 2018-06-18 – 2018-06-24 (×12): 0.5 mg via ORAL
  Filled 2018-06-18 (×18): qty 1

## 2018-06-18 MED ORDER — PRENATAL MULTIVITAMIN CH
1.0000 | ORAL_TABLET | Freq: Every day | ORAL | Status: DC
  Administered 2018-06-19 – 2018-06-24 (×6): 1 via ORAL
  Filled 2018-06-18 (×8): qty 1

## 2018-06-18 MED ORDER — ACETAMINOPHEN 325 MG PO TABS
650.0000 mg | ORAL_TABLET | Freq: Four times a day (QID) | ORAL | Status: DC | PRN
  Administered 2018-06-22: 650 mg via ORAL
  Filled 2018-06-18: qty 2

## 2018-06-18 MED ORDER — HYDROXYZINE HCL 25 MG PO TABS: 50.0000 mg | ORAL_TABLET | Freq: Four times a day (QID) | ORAL | Status: DC | PRN

## 2018-06-18 MED ORDER — TRAZODONE HCL 100 MG PO TABS: 100.0000 mg | ORAL_TABLET | Freq: Every evening | ORAL | Status: DC | PRN

## 2018-06-18 MED ORDER — TEMAZEPAM 15 MG PO CAPS
30.0000 mg | ORAL_CAPSULE | Freq: Every day | ORAL | Status: DC
  Administered 2018-06-18 – 2018-06-22 (×5): 30 mg via ORAL
  Filled 2018-06-18 (×5): qty 2

## 2018-06-18 MED ORDER — MAGNESIUM HYDROXIDE 400 MG/5ML PO SUSP: 30.0000 mL | Freq: Every day | ORAL | Status: DC | PRN

## 2018-06-18 MED ORDER — ALUM & MAG HYDROXIDE-SIMETH 200-200-20 MG/5ML PO SUSP: 30.0000 mL | ORAL | Status: DC | PRN

## 2018-06-18 NOTE — Progress Notes (Signed)
Dr. Elesa MassedWard informed of pt disposition.
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
## 2018-06-18 NOTE — ED Notes (Signed)
[facility]. Report can be called after 9AM at [phone].

## 2018-06-18 NOTE — Progress Notes (Signed)
Ladona Ridgelaylor, Nurse informed of pt disposition.

## 2018-06-18 NOTE — ED Notes (Signed)
Patient made aware that urine sample is needed. Urinal at bedside.  

## 2018-06-18 NOTE — BH Assessment (Signed)
[facility] Assessment Progress Note  Per Donell SievertSpencer Simon, PA, this pt requires psychiatric hospitalization at this time.  Selena BattenKim, RN, [facility]  Pt has signed Voluntary Admission and Consent for Treatment, as well as Consent to Release Information to pt's brother, and to Dr Clyde CanterburyBahrani, and a notification call has been placed to the latter.  [facility].  Pt's nurse, Morrie Sheldonshley, has been notified, and agrees to send original paperwork along with pt via [facility], and to call report to [phone].  Doylene Canninghomas Kenlyn Lose, [place] Behavioral Health Coordinator [phone]

## 2018-06-18 NOTE — ED Notes (Signed)
ED TO INPATIENT HANDOFF REPORT  Name/Age/Gender Anthony Knox 57 y.o. male  Code Status    Code Status Orders  (From admission, onward)         Start     Ordered   06/18/18 0128  Full code  Continuous     06/18/18 0127        Code Status History    Date Active Date Inactive Code Status Order ID Comments User Context   05/07/2018 1135 05/14/2018 2101 Full Code [id number]  Talmage NapRankin, Shuvon B, NP Inpatient   05/05/2018 1347 05/07/2018 1131 Full Code [id number]  Norman ClayHammond, Elizabeth W, PA-C ED      Home/SNF/Other Home  Chief Complaint Mental Health Eval  Level of Care/Admitting Diagnosis ED Disposition    None      Medical History History reviewed. No pertinent past medical history.  Allergies No Known Allergies  IV Location/Drains/Wounds Patient Lines/Drains/Airways Status   Active Line/Drains/Airways    None          Labs/Imaging Results for orders placed or performed during the hospital encounter of 06/18/18 (from the past 48 hour(s))  Comprehensive metabolic panel     Status: Abnormal   Collection Time: 06/18/18  2:22 AM  Result Value Ref Range   Sodium 138 135 - 145 mmol/L   Potassium 4.0 3.5 - 5.1 mmol/L   Chloride 104 98 - 111 mmol/L   CO2 24 22 - 32 mmol/L   Glucose, Bld 112 (H) 70 - 99 mg/dL   BUN 13 6 - 20 mg/dL   Creatinine, Ser 7.820.79 0.61 - 1.24 mg/dL   Calcium 9.1 8.9 - 95.610.3 mg/dL   Total Protein 6.7 6.5 - 8.1 g/dL   Albumin 4.1 3.5 - 5.0 g/dL   AST 28 15 - 41 U/L   ALT 38 0 - 44 U/L   Alkaline Phosphatase 54 38 - 126 U/L   Total Bilirubin 0.7 0.3 - 1.2 mg/dL   GFR calc non Af Amer >60 >60 mL/min   GFR calc Af Amer >60 >60 mL/min   Anion gap 10 5 - 15    Comment: Performed at [facility], [address] [place], [place] [address]  Ethanol     Status: None   Collection Time: 06/18/18  2:22 AM  Result Value Ref Range   Alcohol, Ethyl (B) <10 <10 mg/dL    Comment: (NOTE) Lowest detectable limit for serum alcohol is  10 mg/dL. For medical purposes only. Performed at [facility], [address] [place], [place] [address]   Urine rapid drug screen (hosp performed)     Status: None   Collection Time: 06/18/18  2:22 AM  Result Value Ref Range   Opiates NONE DETECTED NONE DETECTED   Cocaine NONE DETECTED NONE DETECTED   Benzodiazepines NONE DETECTED NONE DETECTED   Amphetamines NONE DETECTED NONE DETECTED   Tetrahydrocannabinol NONE DETECTED NONE DETECTED   Barbiturates NONE DETECTED NONE DETECTED    Comment: (NOTE) DRUG SCREEN FOR MEDICAL PURPOSES ONLY.  IF CONFIRMATION IS NEEDED FOR ANY PURPOSE, NOTIFY LAB WITHIN 5 DAYS. LOWEST DETECTABLE LIMITS FOR URINE DRUG SCREEN Drug Class                     Cutoff (ng/mL) Amphetamine and metabolites    1000 Barbiturate and metabolites    200 Benzodiazepine                 200 Tricyclics and metabolites  300 Opiates and metabolites        300 Cocaine and metabolites        300 THC                            50 Performed at [facility], [address] [place], [place] [address]   CBC with Diff     Status: None   Collection Time: 06/18/18  2:22 AM  Result Value Ref Range   WBC 6.1 4.0 - 10.5 K/uL   RBC 5.16 4.22 - 5.81 MIL/uL   Hemoglobin 15.5 13.0 - 17.0 g/dL   HCT 60.4 54.0 - 98.1 %   MCV 90.5 80.0 - 100.0 fL   MCH 30.0 26.0 - 34.0 pg   MCHC 33.2 30.0 - 36.0 g/dL   RDW 19.1 47.8 - 29.5 %   Platelets 306 150 - 400 K/uL   nRBC 0.0 0.0 - 0.2 %   Neutrophils Relative % 47 %   Neutro Abs 2.9 1.7 - 7.7 K/uL   Lymphocytes Relative 38 %   Lymphs Abs 2.3 0.7 - 4.0 K/uL   Monocytes Relative 13 %   Monocytes Absolute 0.8 0.1 - 1.0 K/uL   Eosinophils Relative 1 %   Eosinophils Absolute 0.0 0.0 - 0.5 K/uL   Basophils Relative 1 %   Basophils Absolute 0.1 0.0 - 0.1 K/uL   Immature Granulocytes 0 %   Abs Immature Granulocytes 0.01 0.00 - 0.07 K/uL    Comment: Performed at [facility],  [address] [place], [place] [address]  Urinalysis, Routine w reflex microscopic     Status: None   Collection Time: 06/18/18  2:22 AM  Result Value Ref Range   Color, Urine YELLOW YELLOW   APPearance CLEAR CLEAR   Specific Gravity, Urine 1.014 1.005 - 1.030   pH 5.0 5.0 - 8.0   Glucose, UA NEGATIVE NEGATIVE mg/dL   Hgb urine dipstick NEGATIVE NEGATIVE   Bilirubin Urine NEGATIVE NEGATIVE   Ketones, ur NEGATIVE NEGATIVE mg/dL   Protein, ur NEGATIVE NEGATIVE mg/dL   Nitrite NEGATIVE NEGATIVE   Leukocytes, UA NEGATIVE NEGATIVE    Comment: Performed at [facility], [address] [place], [place] [address]  Acetaminophen level     Status: Abnormal   Collection Time: 06/18/18  2:22 AM  Result Value Ref Range   Acetaminophen (Tylenol), Serum <10 (L) 10 - 30 ug/mL    Comment: (NOTE) Therapeutic concentrations vary significantly. A range of 10-30 ug/mL  may be an effective concentration for many patients. However, some  are best treated at concentrations outside of this range. Acetaminophen concentrations >150 ug/mL at 4 hours after ingestion  and >50 ug/mL at 12 hours after ingestion are often associated with  toxic reactions. Performed at [facility], [address], [place], [place] [address]   Salicylate level     Status: None   Collection Time: 06/18/18  2:22 AM  Result Value Ref Range   Salicylate Lvl <7.0 2.8 - 30.0 mg/dL    Comment: Performed at [facility], [address] [place], [place] [address]   No results found. None  Pending Labs Unresulted Labs (From admission, onward)   None      Vitals/Pain Today's Vitals   06/18/18 0727 06/18/18 0800 06/18/18 0858 06/18/18 0903  BP:  (!) 126/97  (!) 121/92  Pulse:  72  70  Resp:  14  18  Temp:    (!) 97.4 F (36.3 C)  TempSrc:    Axillary  SpO2:  96%  95%  PainSc: 0-No pain  0-No pain     Isolation Precautions No active  isolations  Medications Medications  hydrOXYzine (ATARAX/VISTARIL) tablet 50 mg (has no administration in time range)  traZODone (DESYREL) tablet 100 mg (has no administration in time range)    Mobility walks

## 2018-06-18 NOTE — ED Notes (Signed)
Patient resting. Denies any needs at this time.

## 2018-06-18 NOTE — ED Provider Notes (Signed)
TIME SEEN: 1:26 AM  CHIEF COMPLAINT: Altered mental status  HPI: Patient is a 57 year old male with history of schizoaffective disorder who presents to the emergency department for altered mental status.  He presents with his brother.  Brother reports today he was contacted by the police that states he was at a [employer] and was disoriented.  Brother reports this is happened to the patient before and required extended psychiatric admission.  Patient states he has had depression, loneliness and intermittent suicidal thoughts.  States he feels like he is seeing shadows.  No HI or auditory hallucinations.  Denies drug or alcohol use.  He states he feels he would benefit from a psychiatric admission.  Brother reports that the patient cannot find his car.  Patient complaining of lower back pain which is new for him.  No history of injury.  No numbness, tingling or focal weakness.  No bowel or bladder incontinence.  ROS: See HPI Constitutional: no fever  Eyes: no drainage  ENT: no runny nose   Cardiovascular:  no chest pain  Resp: no SOB  GI: no vomiting GU: no dysuria Integumentary: no rash  Allergy: no hives  Musculoskeletal: no leg swelling  Neurological: no slurred speech ROS otherwise negative  PAST MEDICAL HISTORY/PAST SURGICAL HISTORY:  History reviewed. No pertinent past medical history.  MEDICATIONS:  Prior to Admission medications   Medication Sig Start Date End Date Taking? Authorizing Provider  hydrOXYzine (ATARAX/VISTARIL) 50 MG tablet Take 1 tablet (50 mg total) by mouth every 6 (six) hours as needed for anxiety. 05/14/18  Yes Micheal Likens, MD  paliperidone (INVEGA SUSTENNA) 156 MG/ML SUSY injection Inject 1 mL (156 mg total) into the muscle every 28 (twenty-eight) days. Last administration on 05/13/18. 06/10/18  Yes [place], Burlene Arnt, MD  traZODone (DESYREL) 100 MG tablet Take 1 tablet (100 mg total) by mouth at bedtime as needed for sleep. 05/14/18  Yes  [place], Burlene Arnt, MD    ALLERGIES:  No Known Allergies  SOCIAL HISTORY:  Social History   Tobacco Use  . Smoking status: Unknown If Ever Smoked  Substance Use Topics  . Alcohol use: Not Currently    FAMILY HISTORY: No family history on file.  EXAM: BP (!) 129/94 (BP Location: Left Arm)   Pulse 93   Temp 98.2 F (36.8 C) (Oral)   Resp 16   SpO2 98%  CONSTITUTIONAL: Alert and oriented to person and place but not time and responds appropriately to questions. Well-appearing; well-nourished HEAD: Normocephalic EYES: Conjunctivae clear, pupils appear equal, EOMI ENT: normal nose; moist mucous membranes NECK: Supple, no meningismus, no nuchal rigidity, no LAD  CARD: RRR; S1 and S2 appreciated; no murmurs, no clicks, no rubs, no gallops RESP: Normal chest excursion without splinting or tachypnea; breath sounds clear and equal bilaterally; no wheezes, no rhonchi, no rales, no hypoxia or respiratory distress, speaking full sentences ABD/GI: Normal bowel sounds; non-distended; soft, non-tender, no rebound, no guarding, no peritoneal signs, no hepatosplenomegaly BACK:  The back appears normal and is non-tender to palpation, there is no CVA tenderness, no midline spinal tenderness or step-off or deformity, no redness or warmth, no ecchymosis EXT: Normal ROM in all joints; non-tender to palpation; no edema; normal capillary refill; no cyanosis, no calf tenderness or swelling    SKIN: Normal color for age and race; warm; no rash NEURO: Moves all extremities equally, normal sensation diffusely, normal gait, no saddle anesthesia PSYCH: Patient endorses increasing depression, loneliness and suicidal thoughts without plan.  Also  reports visual hallucinations.  He has tangential thought process.  No rapid speech.  Flat affect.  MEDICAL DECISION MAKING: Patient here with disorientation, SI, visual hallucinations.  Similar symptoms a month ago requiring behavioral health admission.  He feels  he would benefit from psychiatric mission at this time and is here voluntarily.  Will obtain screening labs and urine.  Will consult TTS.  He is complaining of lower back pain without injury and no focal neurologic deficits.  I do not feel he needs further emergent work-up for this at this time.  ED PROGRESS: TTS recommends inpatient treatment for psychosis.  Screening labs, urine unremarkable.  Patient medically cleared.   I reviewed all nursing notes, vitals, pertinent previous records, EKGs, lab and urine results, imaging (as available).      Nathasha Fiorillo, Layla MawKristen N, DO 06/18/18 [phone]
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
## 2018-06-18 NOTE — ED Notes (Signed)
PT CLEARED BY SECURITY BEFORE TRANSFER. PT AWARE OF ADMISSION TO [facility]. SITTER PRESENT

## 2018-06-18 NOTE — [facility] Group Notes (Signed)
Pt attended spiritual care group led by Wilkie Ayehaplain Chena Chohan, MDiv, [facility].   Group focused on topic of "hope."  Patients engaged in facilitated dialog around topic, discussing where they experience hope in their circumstances and one thing they are hopeful for over next 24 hours.  Interventions: facilitated group discussion, adlerian / client centered    [place] arrived at group late.  Seated in back of room.  Engaged when facilitator expressed curiosity in his thoughts.  Jorja Loaim was dismissive of topic of hope and described feeling as though it was made up. He received some support from group members who listened to him.  When asked to reframe idea in a way that would fit him better, he stated "its bullshit"   He remained in room for duration of group.     Burnis KingfisherMatthew Kelsea Mousel, MDiv, [facility]

## 2018-06-18 NOTE — ED Notes (Signed)
DISCHARGED TO [facility]. TRANSFERRED BY [facility].

## 2018-06-18 NOTE — Progress Notes (Signed)
Patient ID: Anthony Knox, male   DOB: November 03, 1960, 57 y.o.   MRN: [id number] PER STATE REGULATIONS 482.30  THIS CHART WAS REVIEWED FOR MEDICAL NECESSITY WITH RESPECT TO THE PATIENT'S ADMISSION/DURATION OF STAY.  NEXT REVIEW DATE:06/22/18  [name], RN, BSN CASE MANAGER

## 2018-06-18 NOTE — ED Triage Notes (Signed)
Pt from home with his uncle with c/o confusion for a few days. Pt is alert to person and situation but not to time and place. When pt answers incorrectly he laughs. Pt's family member states that pt was inpatient at [facility] a few weeks ago for the same. No neuro deficits noted.

## 2018-06-18 NOTE — ED Notes (Signed)
TTS at bedside. 

## 2018-06-18 NOTE — Progress Notes (Addendum)
AC, Kim assigned [address] Bed 1.  Come after 9am.  Call report to [phone].   Nurse, Ladona Ridgelaylor informed.

## 2018-06-19 DIAGNOSIS — F332 Major depressive disorder, recurrent severe without psychotic features: Principal | ICD-10-CM

## 2018-06-19 MED ORDER — LORAZEPAM 1 MG PO TABS
1.0000 mg | ORAL_TABLET | Freq: Once | ORAL | Status: AC
  Filled 2018-06-19: qty 1

## 2018-06-19 MED ORDER — OLANZAPINE 5 MG PO TBDP
5.0000 mg | ORAL_TABLET | Freq: Once | ORAL | Status: AC
  Administered 2018-06-19: 5 mg via ORAL
  Filled 2018-06-19 (×2): qty 1

## 2018-06-19 MED ORDER — ZIPRASIDONE MESYLATE 20 MG IM SOLR
20.0000 mg | Freq: Once | INTRAMUSCULAR | Status: AC
  Filled 2018-06-19: qty 20
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
## 2018-06-19 NOTE — [facility] Group Notes (Signed)
LCSW Group Therapy Note  06/19/2018     11:15AM-12:00PM  Type of Therapy and Topic:  Group Therapy:  Self Sabotage  Participation Level:  Minimal        . Description of Group:  Today's process group focused on the topic of Self Sabotage, what this is, and what methods of self-sabotage patients in the group have found themselves using.  Commonalities were then pointed out and the group explored possible benefits of choosing healthier coping skills.  Patients were asked to rate both their commitment to change and their confidence in their ability to change from 1 (lowest) to 10 (highest), then asked about their answers in order to provoke change talk.   Therapeutic Goals 1. Patient will be able to identify their typical methods of self sabotage. 2. Patient will list reasons they engage in these destructive behaviors, and harm that comes from them 3. Patient will be able verbalize the costs and benefits of drinking/drugging versus making the choice to change 4. Patient will rate their commitment to change and confidence about their ability to change, and will be guided to change talk.  Summary of Patient Progress: During group, patient participated with another patient in loud side conversations that took over the entire group because of the volume.  They laughed loudly numerous times even as serious things were being shared by others.  CSW stopped group and shared the ground rules for group including only one person speaking at a time, and CSW said that if anybody could not abide by those ground rules they should leave.  He did opt to leave.  Therapeutic Modalities Stages of Change Motivational Interviewing  Ambrose MantleMareida Grossman-Orr, LCSW 06/19/2018, 8:27 AM
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
## 2018-06-19 NOTE — Progress Notes (Signed)
D: Patient decompensated again today prior to dinner. He slammed his door and screamed loudly. When approached, we asked if he was the patient that had screamed, but he denied. The MHT on the hall witnessed it, however.  A: Educated patient on appropriate means for sharing frustration. Spoke with provider Cobos, MD and obtained order to restrict to unit for safety during dinner. R: Patient verbalized understanding.

## 2018-06-19 NOTE — Progress Notes (Signed)
D: Patient observed up and visible in dayroom. On approach, asked patient when he would like his bedtime medications to which patient responded with a nod of his head, "10:42 please!" Patient disorganized and loose in his thinking. Given hs medication in small med cup, patient asked that water be poured into it (which it was), patient then poured meds and water into his leftover coffee cup and exclaimed, "I made them turn over! They must be right sided. I have 4 fore fingers and one thumb." Patient observed and overheard talking to himself while in his room but also at med window. Patient's affect animated, mood elevated. Denies pain, physical complaints.   A: Medicated per orders, no prns requested. Brief medication education provided. Level III obs in place for safety. Emotional support offered. Encouraged to attend and participate in unit programming.   R: Patient verbalizes understanding of POC. Patient denies SI/HI/AVH, however unclear how much patient understands questions. Patient remains safe on level III obs. Will continue to monitor throughout the night.

## 2018-06-19 NOTE — Progress Notes (Signed)
The patient did not have anything to share about his day. Instead, he mentioned that he didn't know what it was or what the day of the week was. His goal for tomorrow is to learn to how to play cards with his peers.

## 2018-06-19 NOTE — Progress Notes (Signed)
[facility] Progress Note  06/19/2018 2:29 PM Anthony Knox  MRN:  [id number] Subjective:  Patient reports he is feeling " a lot better" than on admission but still " nervous". Denies medication side effects. Denies suicidal ideations.  Objective: Patient see and chart notes reviewed. Patient is [age], single, no children, lives alone, on disability. Patient is fair historian, but states he was feeling " out of it" prior to admission, but is feeling more like himself today. As per chart has history of Schizoaffective Disorder . He had [facility] for agitated behavior, disorientation. At the time was diagnosed with Schizoaffective Disorder and discharged on Invega Sustenna 156 mg Q month. Patient states he does not think he took IM depot after said discharge. Today presents alert, attentive, but fair historian, confused, stating we are in [place] , and answers questions with vague answers such as " that sounds  about [place]" " was out of it but now I am better".  Of note,he endorses auditory hallucinations, which he describes as sounds rather than voices.  Denies suicidal ideations.  Of note, 10/23/ Head CT negative, admission BAL/UDS negative.  Principal Problem: Schizoaffective Disorder Diagnosis: Active Problems:   MDD (major depressive disorder), recurrent episode, severe (HCC)  Total Time spent with patient: 20 minutes  Past Psychiatric History:   Past Medical History: No past medical history on file. No past surgical history on file. Family History: No family history on file. Family Psychiatric  History:  Social History:  Social History   Substance and Sexual Activity  Alcohol Use Not Currently     Social History   Substance and Sexual Activity  Drug Use Never    Social History   Socioeconomic History  . Marital status: Single    Spouse name: Not on file  . Number of children: Not on file  . Years of education: Not on file   . Highest education level: Not on file  Occupational History  . Not on file  Social Needs  . Financial resource strain: Not on file  . Food insecurity:    Worry: Not on file    Inability: Not on file  . Transportation needs:    Medical: Not on file    Non-medical: Not on file  Tobacco Use  . Smoking status: Unknown If Ever Smoked  Substance and Sexual Activity  . Alcohol use: Not Currently  . Drug use: Never  . Sexual activity: Not on file  Lifestyle  . Physical activity:    Days per week: Not on file    Minutes per session: Not on file  . Stress: Not on file  Relationships  . Social connections:    Talks on phone: Not on file    Gets together: Not on file    Attends religious service: Not on file    Active member of club or organization: Not on file    Attends meetings of clubs or organizations: Not on file    Relationship status: Not on file  Other Topics Concern  . Not on file  Social History Narrative  . Not on file   Additional Social History:   Sleep: improving   Appetite:  Good  Current Medications: Current Facility-Administered Medications  Medication Dose Route Frequency Provider Last Rate Last Dose  . acetaminophen (TYLENOL) tablet 650 mg  650 mg Oral Q6H PRN Donell SievertSimon, Spencer E, PA-C      . alum & mag  hydroxide-simeth (MAALOX/MYLANTA) 200-200-20 MG/5ML suspension 30 mL  30 mL Oral Q4H PRN Kerry Hough, PA-C      . benztropine (COGENTIN) tablet 0.5 mg  0.5 mg Oral BID Malvin Johns, MD   0.5 mg at 06/19/18 0815  . magnesium hydroxide (MILK OF MAGNESIA) suspension 30 mL  30 mL Oral Daily PRN Kerry Hough, PA-C      . prenatal multivitamin tablet 1 tablet  1 tablet Oral Q1200 Malvin Johns, MD   1 tablet at 06/19/18 1218  . risperiDONE (RISPERDAL) tablet 2 mg  2 mg Oral BID Malvin Johns, MD   2 mg at 06/19/18 0815  . temazepam (RESTORIL) capsule 30 mg  30 mg Oral QHS Malvin Johns, MD   30 mg at 06/18/18 2240    Lab Results:  Results for orders placed  or performed during the hospital encounter of 06/18/18 (from the past 48 hour(s))  Comprehensive metabolic panel     Status: Abnormal   Collection Time: 06/18/18  2:22 AM  Result Value Ref Range   Sodium 138 135 - 145 mmol/L   Potassium 4.0 3.5 - 5.1 mmol/L   Chloride 104 98 - 111 mmol/L   CO2 24 22 - 32 mmol/L   Glucose, Bld 112 (H) 70 - 99 mg/dL   BUN 13 6 - 20 mg/dL   Creatinine, Ser 5.36 0.61 - 1.24 mg/dL   Calcium 9.1 8.9 - 64.4 mg/dL   Total Protein 6.7 6.5 - 8.1 g/dL   Albumin 4.1 3.5 - 5.0 g/dL   AST 28 15 - 41 U/L   ALT 38 0 - 44 U/L   Alkaline Phosphatase 54 38 - 126 U/L   Total Bilirubin 0.7 0.3 - 1.2 mg/dL   GFR calc non Af Amer >60 >60 mL/min   GFR calc Af Amer >60 >60 mL/min   Anion gap 10 5 - 15    Comment: Performed at [facility], [address], [place], [place] [address]  Ethanol     Status: None   Collection Time: 06/18/18  2:22 AM  Result Value Ref Range   Alcohol, Ethyl (B) <10 <10 mg/dL    Comment: (NOTE) Lowest detectable limit for serum alcohol is 10 mg/dL. For medical purposes only. Performed at [facility], [address] [place], [place] [address]   Urine rapid drug screen (hosp performed)     Status: None   Collection Time: 06/18/18  2:22 AM  Result Value Ref Range   Opiates NONE DETECTED NONE DETECTED   Cocaine NONE DETECTED NONE DETECTED   Benzodiazepines NONE DETECTED NONE DETECTED   Amphetamines NONE DETECTED NONE DETECTED   Tetrahydrocannabinol NONE DETECTED NONE DETECTED   Barbiturates NONE DETECTED NONE DETECTED    Comment: (NOTE) DRUG SCREEN FOR MEDICAL PURPOSES ONLY.  IF CONFIRMATION IS NEEDED FOR ANY PURPOSE, NOTIFY LAB WITHIN 5 DAYS. LOWEST DETECTABLE LIMITS FOR URINE DRUG SCREEN Drug Class                     Cutoff (ng/mL) Amphetamine and metabolites    1000 Barbiturate and metabolites    200 Benzodiazepine                 200 Tricyclics and metabolites     300 Opiates and  metabolites        300 Cocaine and metabolites        300 THC  50 Performed at [facility], [address], [place], [place] [address]   CBC with Diff     Status: None   Collection Time: 06/18/18  2:22 AM  Result Value Ref Range   WBC 6.1 4.0 - 10.5 K/uL   RBC 5.16 4.22 - 5.81 MIL/uL   Hemoglobin 15.5 13.0 - 17.0 g/dL   HCT 60.4 54.0 - 98.1 %   MCV 90.5 80.0 - 100.0 fL   MCH 30.0 26.0 - 34.0 pg   MCHC 33.2 30.0 - 36.0 g/dL   RDW 19.1 47.8 - 29.5 %   Platelets 306 150 - 400 K/uL   nRBC 0.0 0.0 - 0.2 %   Neutrophils Relative % 47 %   Neutro Abs 2.9 1.7 - 7.7 K/uL   Lymphocytes Relative 38 %   Lymphs Abs 2.3 0.7 - 4.0 K/uL   Monocytes Relative 13 %   Monocytes Absolute 0.8 0.1 - 1.0 K/uL   Eosinophils Relative 1 %   Eosinophils Absolute 0.0 0.0 - 0.5 K/uL   Basophils Relative 1 %   Basophils Absolute 0.1 0.0 - 0.1 K/uL   Immature Granulocytes 0 %   Abs Immature Granulocytes 0.01 0.00 - 0.07 K/uL    Comment: Performed at [facility], [address], [place], [place] [address]  Urinalysis, Routine w reflex microscopic     Status: None   Collection Time: 06/18/18  2:22 AM  Result Value Ref Range   Color, Urine YELLOW YELLOW   APPearance CLEAR CLEAR   Specific Gravity, Urine 1.014 1.005 - 1.030   pH 5.0 5.0 - 8.0   Glucose, UA NEGATIVE NEGATIVE mg/dL   Hgb urine dipstick NEGATIVE NEGATIVE   Bilirubin Urine NEGATIVE NEGATIVE   Ketones, ur NEGATIVE NEGATIVE mg/dL   Protein, ur NEGATIVE NEGATIVE mg/dL   Nitrite NEGATIVE NEGATIVE   Leukocytes, UA NEGATIVE NEGATIVE    Comment: Performed at [facility], [address] [place], [place] [address]  Acetaminophen level     Status: Abnormal   Collection Time: 06/18/18  2:22 AM  Result Value Ref Range   Acetaminophen (Tylenol), Serum <10 (L) 10 - 30 ug/mL    Comment: (NOTE) Therapeutic concentrations vary significantly. A range of 10-30 ug/mL  may  be an effective concentration for many patients. However, some  are best treated at concentrations outside of this range. Acetaminophen concentrations >150 ug/mL at 4 hours after ingestion  and >50 ug/mL at 12 hours after ingestion are often associated with  toxic reactions. Performed at [facility], [address], [place], [place] [address]   Salicylate level     Status: None   Collection Time: 06/18/18  2:22 AM  Result Value Ref Range   Salicylate Lvl <7.0 2.8 - 30.0 mg/dL    Comment: Performed at [facility], [address], [place], [place] [address]    Blood Alcohol level:  Lab Results  Component Value Date   [facility] <10 06/18/2018   ETH <10 05/05/2018    Metabolic Disorder Labs: Lab Results  Component Value Date   HGBA1C 5.9 (H) 05/08/2018   MPG 122.63 05/08/2018   Lab Results  Component Value Date   PROLACTIN 34.1 (H) 05/08/2018   Lab Results  Component Value Date   CHOL 141 05/08/2018   TRIG 91 05/08/2018   HDL 36 (L) 05/08/2018   CHOLHDL 3.9 05/08/2018   VLDL 18 05/08/2018   LDLCALC 87 05/08/2018    Physical  Findings: AIMS: Facial and Oral Movements Muscles of Facial Expression: None, normal Lips and Perioral Area: None, normal Jaw: None, normal Tongue: None, normal,Extremity Movements Upper (arms, wrists, hands, fingers): None, normal Lower (legs, knees, ankles, toes): None, normal, Trunk Movements Neck, shoulders, hips: None, normal, Overall Severity Severity of abnormal movements (highest score from questions above): None, normal Incapacitation due to abnormal movements: None, normal Patient's awareness of abnormal movements (rate only patient's report): No Awareness, Dental Status Current problems with teeth and/or dentures?: No Does patient usually wear dentures?: No  CIWA:  CIWA-Ar Total: 0 COWS:  COWS Total Score: 5  Musculoskeletal: Strength & Muscle Tone: within normal limits Gait & Station:  normal Patient leans: N/A  Psychiatric Specialty Exam: Physical Exam  ROS mild headache, no chest pain, no shortness of breath at room air, no vomiting   Blood pressure 108/81, pulse 71, temperature 98 F (36.7 C), temperature source Oral, resp. rate 18, SpO2 100 %.There is no height or weight on file to calculate BMI.  General Appearance: Fairly Groomed  Eye Contact:  Fair  Speech:  Normal Rate  Volume:  Decreased  Mood:  reports feeling better than he did prior to admission  Affect:  blunted  Thought Process:  Disorganized and Descriptions of Associations: Tangential becomes tangential with open ended questions  Orientation:  Other:  alert and attentive, confused, states we are currently in [place]   Thought Content:  reports auditory hallucinations described as noises, does not appear internally preoccupied at this time  Suicidal Thoughts:  No denies suicidal ideations, denies self injurious ideations, denies homicidal or violent ideations  Homicidal Thoughts:  No  Memory:  immediate 3/3 ,  2/3 at 5 minutes   Judgement:  Fair  Insight:  limited   Psychomotor Activity:  Normal  Concentration:  Concentration: Fair and Attention Span: Fair  Recall:  [employer] of Knowledge:  Fair  Language:  Fair  Akathisia:  Negative  Handed:  Right  AIMS (if indicated):     Assets:  Desire for Improvement Resilience  ADL's:  Intact  Cognition:  Impaired,  Mild  Sleep:  Number of Hours: 6.75    Assessment -  57 year old male, presented with confusional state thought to be related to exacerbation of Schizoaffective Disorder. He has had similar prior presentations and was admitted to [facility] in October of 2019. Today reports improvement and states feeling a lot better than on admission , although remains poor historian, with disorganized thought process and some disorientation. Behavior is currently calm and cooperative. Denies suicidal ideations or medication side effects. Treatment Plan  Summary: Daily contact with patient to assess and evaluate symptoms and progress in treatment, Medication management, Plan inpatient treatment and medications as below Encourage group and milieu participation to work on coping skills and symptom reduction Continue Risperidone 2 mgrs BID for psychosis Continue Cogentin 0.5 mgrs BID for EPS prophylaxis  Continue Restoril 30 mgrs QHS for insomnia Zyprexa, Ativan PO or Geodon IM for agitation as per protocol  Treatment team working on disposition planning options Craige Cotta, MD 06/19/2018, 2:29 PM

## 2018-06-19 NOTE — Progress Notes (Addendum)
   06/19/18 2127  What Happened  Was fall witnessed? No (however staff on hall did hear what appeared to be a fall)  Was patient injured? No  Patient found other (Comment) (patient arose after falling, laid back down in bed)  Found by No one-pt stated they fell  Stated prior activity other (comment) (lying in bed reaching for pillow)  Follow Up  MD notified Erlene QuanJ Berry, NP  Time MD notified 2132  Family notified No- patient refusal  Additional tests No  Simple treatment Other (comment) (emotional support and re-education)  Progress note created (see row info) Yes  Adult Fall Risk Assessment  Risk Factor Category (scoring not indicated) Fall has occurred during this admission (document High fall risk)  Patient Fall Risk Level High fall risk  Adult Fall Risk Interventions  Required Bundle Interventions *See Row Information* High fall risk - low, moderate, and high requirements implemented  Additional Interventions Assess orthostatic BP;Reorient/diversional activities with confused patients  Screening for Fall Injury Risk (To be completed on HIGH fall risk patients who do not meet crieteria for Low Bed) - Assessing Need for Floor Mats Only  Risk For Fall Injury- Criteria for Floor Mats None identified - No additional interventions needed  Vitals  Temp 97.8 F (36.6 C)  Temp Source Oral  BP 118/88  MAP (mmHg) 97  BP Method Automatic  Pulse Rate 71  Resp 16  Pain Assessment  Pain Scale 0-10  Pain Score 0  PAINAD (Pain Assessment in Advanced Dementia)  Breathing 0  PCA/Epidural/Spinal Assessment  Respiratory Pattern Regular;Unlabored  Neurological  Level of Consciousness Alert  Orientation Level Oriented to place;Oriented to person    Denies pain or injury. VSS. Apolinar JunesBrandon, charge RN and [place]Kelly, [facility] notified.Per Allyson SabalBerry, NP neuro checks are not needed. High fall precautions implemented and patient re-educated.  He verbalizes understanding. Currently resting in bed.

## 2018-06-20 DIAGNOSIS — F333 Major depressive disorder, recurrent, severe with psychotic symptoms: Secondary | ICD-10-CM

## 2018-06-20 MED ORDER — LORAZEPAM 1 MG PO TABS: 1.0000 mg | ORAL_TABLET | Freq: Once | ORAL | Status: DC

## 2018-06-20 MED ORDER — OLANZAPINE 5 MG PO TBDP
5.0000 mg | ORAL_TABLET | Freq: Once | ORAL | Status: DC
  Filled 2018-06-20: qty 1

## 2018-06-20 MED ORDER — ZIPRASIDONE MESYLATE 20 MG IM SOLR
20.0000 mg | Freq: Once | INTRAMUSCULAR | Status: DC
  Filled 2018-06-20: qty 20

## 2018-06-20 MED ORDER — HYDROXYZINE HCL 50 MG PO TABS
50.0000 mg | ORAL_TABLET | ORAL | Status: DC | PRN
  Administered 2018-06-20 – 2018-06-21 (×3): 50 mg via ORAL
  Filled 2018-06-20 (×3): qty 1
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
## 2018-06-20 NOTE — Plan of Care (Signed)
D: Patient presents irritable. He reports having agitation and needing something for it. He reports anxiety caused by another patient who is disruptive on the unit. This is an improvement over yesterday, as he did not come to get staff when he felt this way. I gave him vistaril for anxiety. He also filled out his self-inventory and while it was significantly disorganized, he has semi-coherent sentences written on it. He still has disorganized speech and continues to crunch numbers when he is receiving his morning medications. Patient denies SI/HI/AVH.  A: Patient checked q15 min, and checks reviewed. Reviewed medication changes with patient and educated on side effects. Educated patient on importance of attending group therapy sessions and educated on several coping skills. Encouarged participation in milieu through recreation therapy and attending meals with peers. Support and encouragement provided. Fluids offered. R: Patient receptive to education on medications, and is medication compliant. Patient contracts for safety on the unit.

## 2018-06-20 NOTE — Progress Notes (Signed)
D: Patient is noted to have previously discharged with plan to receive Invega Sustenna Injection on 11/28. It is unclear if he ever received this injection.
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
## 2018-06-20 NOTE — Progress Notes (Addendum)
[facility] MD Progress Note  06/20/2018 1:05 PM Anthony Knox  MRN:  [id number]  Subjective:  Lary reports, "I'm doing well. I could not sleep last night because I was busy [place] the [place] at [place]. My mood is a Programmer, systems, Psychologist, occupational, the magical wizard of the Oz. I keep having the scarecrow in my head. The food here is addictive, it must be what you guys are putting in it, cocaine or weed. By the way, what is the opposite of fruit cake?"  (Per Md's admission evaluation):  Anthony Knox is 57 years of age and he is admitted due to a confusional state thought to be related to an exacerbation in his underlying schizoaffective condition.  He was medically screened in the emergency department and his drug screen is negative for all compounds.  He is apparently on long-acting injectable paliperidone and states he has been current with his injections. Because he has in the past presented with confusional state his CT scan of the head was negative for acute processes on 10/23 of this year. Overall it is difficult to get a coherent history out of him because of his loose associations for example when asked him if he knows what month it is he states how many days are there in the month and then begins counting backwards from 28 or 30 on other occasions.  He cannot articulate where he has he states he is at [employer]" he also is unaware of date and time he is unaware of how he got to the hospital. Again he has loose associations he laughs at times inappropriately during the interview but denies that he is hearing and seeing things. Disorganized in his thought processes and again rambling and using word associations that are very loose so that it sounds like he is using word salad in his speech  Objective: [name] is seen, chart reviewed. The chart findings discussed with the treatment team. [name] presents manic, delusional with tangential & disorganized thinking. He is verbally responsive, however, his thought process is  all over the place & irrelevant to situation. He is pleasantly psychotic & inappropriate. Although disorganized & tangential, he remains visible on the unit, attending group sessions. He is currently on unit restriction due to intrusive behaviors. He continues to require mood stabilization treatments.  Principal Problem: Schizoaffective Disorder  Diagnosis: Active Problems:   MDD (major depressive disorder), recurrent episode, severe (HCC)  Total Time spent with patient: 15 minutes  Past Psychiatric History: See H&P  Past Medical History: No past medical history on file. No past surgical history on file.  Family History: No family history on file.  Family Psychiatric  History: See H&P  Social History:  Social History   Substance and Sexual Activity  Alcohol Use Not Currently     Social History   Substance and Sexual Activity  Drug Use Never    Social History   Socioeconomic History  . Marital status: Single    Spouse name: Not on file  . Number of children: Not on file  . Years of education: Not on file  . Highest education level: Not on file  Occupational History  . Not on file  Social Needs  . Financial resource strain: Not on file  . Food insecurity:    Worry: Not on file    Inability: Not on file  . Transportation needs:    Medical: Not on file    Non-medical: Not on file  Tobacco Use  . Smoking status: Unknown  If Ever Smoked  Substance and Sexual Activity  . Alcohol use: Not Currently  . Drug use: Never  . Sexual activity: Not on file  Lifestyle  . Physical activity:    Days per week: Not on file    Minutes per session: Not on file  . Stress: Not on file  Relationships  . Social connections:    Talks on phone: Not on file    Gets together: Not on file    Attends religious service: Not on file    Active member of club or organization: Not on file    Attends meetings of clubs or organizations: Not on file    Relationship status: Not on file  Other  Topics Concern  . Not on file  Social History Narrative  . Not on file   Additional Social History:   Sleep: improving   Appetite:  Good  Current Medications: Current Facility-Administered Medications  Medication Dose Route Frequency Provider Last Rate Last Dose  . acetaminophen (TYLENOL) tablet 650 mg  650 mg Oral Q6H PRN Kerry HoughSimon, Spencer E, PA-C      . alum & mag hydroxide-simeth (MAALOX/MYLANTA) 200-200-20 MG/5ML suspension 30 mL  30 mL Oral Q4H PRN Kerry HoughSimon, Spencer E, PA-C      . benztropine (COGENTIN) tablet 0.5 mg  0.5 mg Oral BID Malvin JohnsFarah, Brian, MD   0.5 mg at 06/20/18 0814  . hydrOXYzine (ATARAX/VISTARIL) tablet 50 mg  50 mg Oral Q4H PRN Armandina StammerNwoko, Agnes I, NP   50 mg at 06/20/18 1042  . magnesium hydroxide (MILK OF MAGNESIA) suspension 30 mL  30 mL Oral Daily PRN Kerry HoughSimon, Spencer E, PA-C      . prenatal multivitamin tablet 1 tablet  1 tablet Oral Q1200 Malvin JohnsFarah, Brian, MD   1 tablet at 06/20/18 1236  . risperiDONE (RISPERDAL) tablet 2 mg  2 mg Oral BID Malvin JohnsFarah, Brian, MD   2 mg at 06/20/18 16100814  . temazepam (RESTORIL) capsule 30 mg  30 mg Oral QHS Malvin JohnsFarah, Brian, MD   30 mg at 06/19/18 2119   Lab Results:  No results found for this or any previous visit (from the past 48 hour(s)).  Blood Alcohol level:  Lab Results  Component Value Date   ETH <10 06/18/2018   ETH <10 05/05/2018   Metabolic Disorder Labs: Lab Results  Component Value Date   HGBA1C 5.9 (H) 05/08/2018   MPG 122.63 05/08/2018   Lab Results  Component Value Date   PROLACTIN 34.1 (H) 05/08/2018   Lab Results  Component Value Date   CHOL 141 05/08/2018   TRIG 91 05/08/2018   HDL 36 (L) 05/08/2018   CHOLHDL 3.9 05/08/2018   VLDL 18 05/08/2018   LDLCALC 87 05/08/2018   Physical Findings: AIMS: Facial and Oral Movements Muscles of Facial Expression: None, normal Lips and Perioral Area: None, normal Jaw: None, normal Tongue: None, normal,Extremity Movements Upper (arms, wrists, hands, fingers): None, normal Lower  (legs, knees, ankles, toes): None, normal, Trunk Movements Neck, shoulders, hips: None, normal, Overall Severity Severity of abnormal movements (highest score from questions above): None, normal Incapacitation due to abnormal movements: None, normal Patient's awareness of abnormal movements (rate only patient's report): No Awareness, Dental Status Current problems with teeth and/or dentures?: No Does patient usually wear dentures?: No  CIWA:  CIWA-Ar Total: 0 COWS:  COWS Total Score: 0  Musculoskeletal: Strength & Muscle Tone: within normal limits Gait & Station: normal Patient leans: N/A  Psychiatric Specialty Exam: Physical Exam  Nursing note  and vitals reviewed.   Review of Systems  Eyes: Negative.   Respiratory: Negative for cough and shortness of breath.   Cardiovascular: Negative for chest pain and palpitations.  Neurological: Negative for dizziness.  Psychiatric/Behavioral: Positive for hallucinations (Manic, delusional). Negative for depression, memory loss, substance abuse and suicidal ideas. The patient is nervous/anxious and has insomnia.    mild headache, no chest pain, no shortness of breath at room air, no vomiting   Blood pressure (!) 125/92, pulse 71, temperature 98.4 F (36.9 C), temperature source Oral, resp. rate 16, SpO2 98 %.There is no height or weight on file to calculate BMI.  General Appearance: Fairly Groomed  Eye Contact:  Fair  Speech:  Normal Rate  Volume:  Decreased  Mood:  reports feeling better than he did prior to admission  Affect:  blunted  Thought Process:  Disorganized and Descriptions of Associations: Tangential becomes tangential with open ended questions  Orientation:  Other:  alert and attentive, confused, states we are currently in [place]   Thought Content:  reports auditory hallucinations described as noises, does not appear internally preoccupied at this time  Suicidal Thoughts:  No denies suicidal ideations, denies self injurious  ideations, denies homicidal or violent ideations  Homicidal Thoughts:  No  Memory:  immediate 3/3 ,  2/3 at 5 minutes   Judgement:  Fair  Insight:  limited   Psychomotor Activity:  Normal  Concentration:  Concentration: Fair and Attention Span: Fair  Recall:  [employer] of Knowledge:  Fair  Language:  Fair  Akathisia:  Negative  Handed:  Right  AIMS (if indicated):     Assets:  Desire for Improvement Resilience  ADL's:  Intact  Cognition:  Impaired,  Mild  Sleep:  Number of Hours: 5   Assessment -  57 year old male, presented with confusional state thought to be related to exacerbation of Schizoaffective Disorder. He has had similar prior presentations and was admitted to [facility] in October of 2019. Today reports improvement and states feeling a lot better than on admission , although remains poor historian, with disorganized thought process and some disorientation. Behavior is currently calm and cooperative. Denies suicidal ideations or medication side effects.  Treatment Plan Summary: Daily contact with patient to assess and evaluate symptoms and progress in treatment and Medication management.  - Continue inpatient hospitalization.  - Will continue today 06/20/2018 plan as below except where it is noted.  Mood control.    - Continue Risperidone 2 mgrs BID.  EPS     - Continue Cogentin 0.5 mgrs BID.  Insomnia      - Continue Restoril 30 mgrs QHS.  Agitation/psychosis protocols.     - Continue Zyprexa zydis 5 mg Q 8 hours prn        &     -  Ativan 1 mg po x once          &      - Geodon 20 mg po or IM x once  Treatment team working on disposition planning options  Encourage group and milieu participation to work on Pharmacologist and symptom reduction  Armandina Stammer, NP, pmhnp, fnp-bc 06/20/2018, 1:05 PMPatient ID: Helene Kelp, male   DOB: 1960-08-05, 57 y.o.   MRN: [id number] .Marland KitchenAgree with NP Progress Note

## 2018-06-20 NOTE — [facility] Group Notes (Signed)
[facility] Group Notes:  (Nursing)  Date:  06/20/2018  Time: 930 am Type of Therapy:  Nurse Education  Participation Level:  Active  Participation Quality:  Attentive  Affect:  Appropriate  Cognitive:  Disorganized  Insight:  Lacking  Engagement in Group:  Improving  Modes of Intervention:  Discussion and Education  Summary of Progress/Problems: Group played a non competitive learning/communication board game that fosters listening skills as well as self expression. [name] 06/20/2018, 6:34 PM

## 2018-06-20 NOTE — Plan of Care (Signed)
  Problem: Coping: Goal: Ability to verbalize frustrations and anger appropriately will improve Outcome: Not Progressing Goal: Ability to demonstrate self-control will improve Outcome: Not Progressing   

## 2018-06-20 NOTE — [facility] Group Notes (Signed)
[facility]  Date/Time:  06/20/2018  11:00AM-12:00PM  Type of Therapy and Topic:  Group Therapy:  Music and Mood  Participation Level:  Active   Description of Group: In this process group, members listened to a variety of genres of music and identified that different types of music evoke different responses.  Patients were encouraged to identify music that was soothing for them and music that was energizing for them.  Patients discussed how this knowledge can help with wellness and recovery in various ways including managing depression and anxiety as well as encouraging healthy sleep habits.    Therapeutic Goals: 1. Patients will explore the impact of different varieties of music on mood 2. Patients will verbalize the thoughts they have when listening to different types of music 3. Patients will identify music that is soothing to them as well as music that is energizing to them 4. Patients will discuss how to use this knowledge to assist in maintaining wellness and recovery 5. Patients will explore the use of music as a coping skill  Summary of Patient Progress:  At the beginning of group, patient expressed that he felt "relieved" and at the end of group said he felt "the same."  He giggled and engaged in inappropriate subject matter with another patient who was difficult to redirect.  They frequently had to be asked to stop talking over the top of the music.  Therapeutic Modalities: Solution Focused Brief Therapy Activity   Ambrose MantleMareida Grossman-Orr, LCSW

## 2018-06-20 NOTE — Progress Notes (Signed)
D: Patient observed up and visible on unit early in the shift. Patient asked staff to watch him shave right as group was about to start. When patient was told he would need to wait until after group, patient walked back into dayroom where he attempted to pick up one of the chairs (patient has a history of throwing chairs, items at staff on prior admit). He was unable to do so due to its weight and instead went to this room. He then approached staff a few minutes later asking to go into group which had already started. When given kind but enforceable limits regarding his behavior, patient became angry and argumentative. Patient was able to sit through the brief remainder of group and returned to his room where he is currently resting. Patient VS obtained per post fall flowsheet. VSS, denies pain, and no evidence of injury is present. No other physical complaints.   A: HS restoril held at this time as patient is asleep. Will offer should patient awaken. Level III obs in place for safety. Emotional support offered and limits set.   R: Patient denies SI/HI but continues to respond to internal stimuli. Delusional and disorganized in his thought patterns. Patient remains safe on level III obs. Will continue to monitor throughout the night.

## 2018-06-21 MED ORDER — CARBAMAZEPINE 200 MG PO TABS
200.0000 mg | ORAL_TABLET | Freq: Three times a day (TID) | ORAL | Status: DC
  Administered 2018-06-21 – 2018-06-24 (×10): 200 mg via ORAL
  Filled 2018-06-21 (×18): qty 1

## 2018-06-21 MED ORDER — RISPERIDONE 3 MG PO TABS
3.0000 mg | ORAL_TABLET | Freq: Three times a day (TID) | ORAL | Status: DC
  Administered 2018-06-21: 3 mg via ORAL
  Filled 2018-06-21: qty 3
  Filled 2018-06-21 (×5): qty 1

## 2018-06-21 MED ORDER — DIPHENHYDRAMINE HCL 25 MG PO CAPS
25.0000 mg | ORAL_CAPSULE | Freq: Two times a day (BID) | ORAL | Status: DC
  Administered 2018-06-21 – 2018-06-24 (×6): 25 mg via ORAL
  Filled 2018-06-21 (×12): qty 1

## 2018-06-21 NOTE — Progress Notes (Signed)
Patient ID: Helene Kelpimothy J Yi, male   DOB: January 28, 1961, 57 y.o.   MRN: [id number]  D. Pt presents with a flat affect and labile behavior. Pt is involved in a verbal altercation with another patient on the unit this morning. As Clinical research associatewriter approaches pt, pt states "Coming at me with hands in your pockets, I will go off." Pt threatens to act out numerous times while requesting actions from staff.   A. Labs and vitals monitored. Pt given and educated on medications. Pt supported emotionally and encouraged to express concerns and ask questions.  Pt encouraged to notify staff if feeling anxious or upset prior to acting out, needs reinforcement.   R. Pt remains safe with 15 minute checks. Pt currently denies SI/HI and AVH and agrees to contact staff before acting on any harmful thoughts. Pt given his shoes from locker because he reported intense pain while walking in hospital socks on hard floor. Security guard Denyse AmassCorey verified that only shoes were removed from locker, shoe strings were placed back in pt belongings bag and locker was locked. Will continue POC.

## 2018-06-21 NOTE — [facility] Suicide Risk Assessment (Signed)
[facility] INPATIENT:  Family/Significant Other Suicide Prevention Education  Suicide Prevention Education:  Contact Attempts: brother, David Cecilio [phone] or [name] has been identified by the patient as the family member/significant other with whom the patient will be residing, and identified as the person(s) who will aid the patient in the event of a mental health crisis.  With written consent from the patient, two attempts were made to provide suicide prevention education, prior to and/or following the patient's discharge.  We were unsuccessful in providing suicide prevention education.  A suicide education pamphlet was given to the patient to share with family/significant other.  Date and time of first attempt: 06/21/18 at 10:55am CSW tried both numbers listed for contact. Left HIPPA compliant voicemail on [phone] requesting call back.  CSW will follow up at a later time.   Anthony McleanCharlotte C Braeton Wolgamott 06/21/2018, 10:57 AM

## 2018-06-21 NOTE — Progress Notes (Signed)
Recreation Therapy Notes  INPATIENT RECREATION THERAPY ASSESSMENT  Patient Details Name: Anthony Knox MRN: [id number] DOB: [birth year] Today's Date: 06/21/2018       Information Obtained From: Patient  Able to Participate in Assessment/Interview: Yes  Patient Presentation: Alert  Reason for Admission (Per Patient): Other (Comments)(Pt stated he was here for lack of sleep)  Patient Stressors: Other (Comment)(Pt stated he didn't trust himself or others)  Coping Skills:   TV, Music, Meditate, Impulsivity, Talk, Avoidance, Intrusive Behavior, Hot Bath/Shower  Leisure Interests (2+):  Individual - TV, Social - Family, Music - Listen, Technical brewerature - Other (Comment)(Be out in nature)  Frequency of Recreation/Participation: Other (Comment)(Daily)  Awareness of Community Resources:  Yes  Community Resources:  Park, Public affairs consultantestaurants, Other (Comment)([place])  Current Use: Yes  If no, Barriers?:    Expressed Interest in State Street CorporationCommunity Resource Information: No  [place]  Patient Main Form of Transportation: Set designerCar  Patient Strengths:  Common sense; Glass blower/designerCross seller (salesman)  Patient Identified Areas of Improvement:  Coping skills; Make the past go away  Patient Goal for Hospitalization:  "Be more open minded"  Current SI (including self-harm):  No  Current HI:  Yes(Pt rated it a -99 and contracts)  Current AVH: No  Staff Intervention Plan: Group Attendance, Collaborate with Interdisciplinary Treatment Team  Consent to Intern Participation: N/A   Caroll RancherMarjette Crawford Tamura, LRT/CTRS   Caroll RancherLindsay, Yacine Garriga A 06/21/2018, 2:17 PM

## 2018-06-21 NOTE — Tx Team (Signed)
Interdisciplinary Treatment and Diagnostic Plan Update  06/21/2018 Time of Session: 9:00am Anthony Knox MRN: [id number]  Principal Diagnosis:  Schizoaffective disorder ([place])  Secondary Diagnoses: Active Problems:   MDD (major depressive disorder), recurrent episode, severe (HCC)   Current Medications:  Current Facility-Administered Medications  Medication Dose Route Frequency Provider Last Rate Last Dose  . acetaminophen (TYLENOL) tablet 650 mg  650 mg Oral Q6H PRN Laverle Hobby, PA-C      . alum & mag hydroxide-simeth (MAALOX/MYLANTA) 200-200-20 MG/5ML suspension 30 mL  30 mL Oral Q4H PRN Laverle Hobby, PA-C      . benztropine (COGENTIN) tablet 0.5 mg  0.5 mg Oral BID Johnn Hai, MD   0.5 mg at 06/21/18 0923  . hydrOXYzine (ATARAX/VISTARIL) tablet 50 mg  50 mg Oral Q4H PRN Lindell Spar I, NP   50 mg at 06/21/18 0339  . OLANZapine zydis (ZYPREXA) disintegrating tablet 5 mg  5 mg Oral Once Lindell Spar I, NP   Stopped at 06/20/18 1441   Or  . LORazepam (ATIVAN) tablet 1 mg  1 mg Oral Once Lindell Spar I, NP       Or  . ziprasidone (GEODON) injection 20 mg  20 mg Intramuscular Once Nwoko, Herbert Pun I, NP      . magnesium hydroxide (MILK OF MAGNESIA) suspension 30 mL  30 mL Oral Daily PRN Laverle Hobby, PA-C      . prenatal multivitamin tablet 1 tablet  1 tablet Oral Q1200 Johnn Hai, MD   1 tablet at 06/20/18 1236  . risperiDONE (RISPERDAL) tablet 2 mg  2 mg Oral BID Johnn Hai, MD   2 mg at 06/21/18 9675  . temazepam (RESTORIL) capsule 30 mg  30 mg Oral QHS Johnn Hai, MD   30 mg at 06/20/18 2301   PTA Medications: Medications Prior to Admission  Medication Sig Dispense Refill Last Dose  . hydrOXYzine (ATARAX/VISTARIL) 50 MG tablet Take 1 tablet (50 mg total) by mouth every 6 (six) hours as needed for anxiety. 30 tablet 0 Past Week at Unknown time  . paliperidone (INVEGA SUSTENNA) 156 MG/ML SUSY injection Inject 1 mL (156 mg total) into the muscle every 28 (twenty-eight)  days. Last administration on 05/13/18. 1.2 mL 0 05/13/2018  . traZODone (DESYREL) 100 MG tablet Take 1 tablet (100 mg total) by mouth at bedtime as needed for sleep. 30 tablet 0 06/17/2018 at Unknown time    Patient Stressors:  Back pain and lack of social relationships  Treatment Modalities: Medication Management, Group therapy, Case management,  1 to 1 session with clinician, Psychoeducation, Recreational therapy.   Physician Treatment Plan for Primary Diagnosis: Schizoaffective disorder ([place]) Long Term Goal(s): Improvement in symptoms so as ready for discharge Improvement in symptoms so as ready for discharge   Short Term Goals: Ability to maintain clinical measurements within normal limits will improve Ability to disclose and discuss suicidal ideas  Medication Management: Evaluate patient's response, side effects, and tolerance of medication regimen.  Therapeutic Interventions: 1 to 1 sessions, Unit Group sessions and Medication administration.  Evaluation of Outcomes: Not Met  Physician Treatment Plan for Secondary Diagnosis: Active Problems:   MDD (major depressive disorder), recurrent episode, severe ([place])  Long Term Goal(s): Improvement in symptoms so as ready for discharge Improvement in symptoms so as ready for discharge   Short Term Goals: Ability to maintain clinical measurements within normal limits will improve Ability to disclose and discuss suicidal ideas     Medication Management: Evaluate patient's  response, side effects, and tolerance of medication regimen.  Therapeutic Interventions: 1 to 1 sessions, Unit Group sessions and Medication administration.  Evaluation of Outcomes: Not Met   RN Treatment Plan for Primary Diagnosis: Schizoaffective disorder ([place]) Long Term Goal(s): Knowledge of disease and therapeutic regimen to maintain health will improve  Short Term Goals: Ability to remain free from injury will improve, Ability to demonstrate self-control,  Ability to participate in decision making will improve, Ability to verbalize feelings will improve, Ability to disclose and discuss suicidal ideas and Compliance with prescribed medications will improve  Medication Management: RN will administer medications as ordered by provider, will assess and evaluate patient's response and provide education to patient for prescribed medication. RN will report any adverse and/or side effects to prescribing provider.  Therapeutic Interventions: 1 on 1 counseling sessions, Psychoeducation, Medication administration, Evaluate responses to treatment, Monitor vital signs and CBGs as ordered, Perform/monitor CIWA, COWS, AIMS and Fall Risk screenings as ordered, Perform wound care treatments as ordered.  Evaluation of Outcomes: Not Met   LCSW Treatment Plan for Primary Diagnosis: Schizoaffective disorder ([place]) Long Term Goal(s): Safe transition to appropriate next level of care at discharge, Engage patient in therapeutic group addressing interpersonal concerns.  Short Term Goals: Engage patient in aftercare planning with referrals and resources, Increase social support, Increase ability to appropriately verbalize feelings, Increase emotional regulation and Increase skills for wellness and recovery  Therapeutic Interventions: Assess for all discharge needs, 1 to 1 time with Social worker, Explore available resources and support systems, Assess for adequacy in community support network, Educate family and significant other(s) on suicide prevention, Complete Psychosocial Assessment, Interpersonal group therapy.  Evaluation of Outcomes: Not Met   Progress in Treatment: Attending groups: Yes. Participating in groups: Yes. Taking medication as prescribed: Yes. Toleration medication: Yes. Family/Significant other contact made: No, will contact:  brother, [name]  Patient understands diagnosis: Yes. Discussing patient identified problems/goals with staff: Yes. Medical  problems stabilized or resolved: No. Denies suicidal/homicidal ideation: Yes. Issues/concerns per patient self-inventory: Yes.   New problem(s) identified: No, Describe:  continuing to assess  New Short Term/Long Term Goal(s): medication management for mood stabilization; elimination of SI thoughts; development of comprehensive mental wellness/sobriety plan.  Patient Goals: "Come out looking from the other side."  Discharge Plan or Barriers: Continuing to assess. [place] pamphlet, Mobile Crisis information, and information provided to patient for additional community support and resources.   Reason for Continuation of Hospitalization: Anxiety Delusions  Depression Medication stabilization  Estimated Length of Stay: 3-5 days  Attendees: Patient: Anthony Knox 06/21/2018 9:23 AM  Physician: Dr. Jake Samples 06/21/2018 9:23 AM  Nursing: Yetta Flock 06/21/2018 9:23 AM  RN Care Manager: 06/21/2018 9:23 AM  Social Worker: Stephanie Acre, [place] 06/21/2018 9:23 AM  Recreational Therapist:  06/21/2018 9:23 AM  Other:  06/21/2018 9:23 AM  Other:  06/21/2018 9:23 AM  Other: 06/21/2018 9:23 AM    Scribe for Treatment Team: Joellen Jersey, [place] 06/21/2018 9:23 AM

## 2018-06-21 NOTE — Progress Notes (Addendum)
Patient awoke around 0330 or so. Observed sitting on bed coloring with lights on over his bed. Patient rummaging through items, took a shower, and is now sitting on the garbage can by the door. Remains bizarre with loose associations. Attempted to redirect behavior without success. Offered vistaril prn which he took however behavior has remained unchanged. Total sleep time is 3.25 hours.
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
## 2018-06-21 NOTE — Progress Notes (Signed)
D: Pt denies SI/HI/AVH. Pt visible in the dayroom sometimes, pt keeps to himself A: Pt was offered support and encouragement.  Pt was encourage to attend groups. Q 15 minute checks were done for safety.  R: safety maintained on unit.  Problem: Activity: Goal: Interest or engagement in activities will improve Outcome: Progressing   Problem: Activity: Goal: Sleeping patterns will improve Outcome: Progressing   Problem: Safety: Goal: Periods of time without injury will increase Outcome: Progressing

## 2018-06-21 NOTE — Progress Notes (Signed)
Patient ID: Anthony Knox, male   DOB: April 18, 1961, 57 y.o.   MRN: [id number]   Background given by brother: Brother has concerns about patients discharge. Does not want to be his guardian but has concerns that he won't adhere to medications post discharge. States he found pill bottles- fluenzaphine filled may [id number] and vivaloprex depakote March 14, 2015 both had a lot of pills in them. Brother also reports they have been unable to find pt car since 10/19.Two people in pt house, roommate and another girl. Roommate says Anthony Knox owes him $10,000. Mental health issues run in family.  Mom died in 2008, he lived and took care of her. [facility] the [facility]. Has concerns that he has been using cocaine and alcohol as coping skills for grief.

## 2018-06-21 NOTE — [facility] Suicide Risk Assessment (Signed)
[facility] INPATIENT:  Family/Significant Other Suicide Prevention Education  Suicide Prevention Education:  Contact Attempts: brother, Delrae SawyersDavid Valiente [phone] or [phone] has been identified by the patient as the family member/significant other with whom the patient will be residing, and identified as the person(s) who will aid the patient in the event of a mental health crisis.  With written consent from the patient, two attempts were made to provide suicide prevention education, prior to and/or following the patient's discharge.  We were unsuccessful in providing suicide prevention education.  A suicide education pamphlet was given to the patient to share with family/significant other.  Date and time of first attempt: 06/21/18 at 10:55am CSW tried both numbers listed for contact. Left HIPPA compliant voicemail on [phone] requesting call back.  CSW will follow up at a later time. Date and time of second attempt: 06/21/18 at 2:57pm  CSW missed a returned call earlier in the afternoon and left Onalee HuaDavid another [employer] Corporationvoicemail. Will attempt follow up at a later time.   Darreld McleanCharlotte C Shacora Zynda 06/21/2018, 2:58 PM

## 2018-06-21 NOTE — [facility] Group Notes (Signed)
LCSW Group Therapy Notes 06/21/2018 2:43 PM  Type of Therapy and Topic: Group Therapy: Effective Communication  Participation Level: Minimal  Description of Group:  In this group patients will be asked to identify their own styles of communication as well as defining and identifying passive, assertive, and aggressive styles of communication. Participants will identify strategies to communicate in a more assertive manner in an effort to appropriately meet their needs. This group will be process-oriented, with patients participating in exploration of their own experiences as well as giving and receiving support and challenge from other group members.  Therapeutic Goals: 1. Patient will identify their personal communication style. 2. Patient will identify passive, assertive, and aggressive forms of communication. 3. Patient will identify strategies for developing more effective communication to appropriately meet their needs.   Summary of Patient Progress Patient remained present for the group and listened quietly for most of group. Patient responded off topic to a handful of questions and gave indirect or confusing answers at times when directly prompted.   Therapeutic Modalities:  Communication Skills Solution Focused Therapy Motivational Interviewing    Anthony Knox, MSW, [facility]CSWA 06/21/2018 2:03 PM

## 2018-06-21 NOTE — Progress Notes (Signed)
Recreation Therapy Notes  Date: 12.9.19 Time: 1000 Location: 500 Hall Dayroom  Group Topic: Coping Skills  Goal Area(s) Addresses:  Patient will be able to identify positive coping skills.                  Patient will be able to identify benefit of using coping skills post d/c.  Behavioral Response: Engaged  Intervention: Worksheet  Activity: Mind map.  LRT and patients filled in the first 8 boxes (depression, anxiety, anger, mood swings, drugs/alcohol, finances, transportation, family/friends) together with situations that would require the use of coping skills.  Patients were to then come up with three coping skills for each scenario individually before reforming as a group.  Education: PharmacologistCoping Skills, Building control surveyorDischarge Planning.   Education Outcome: Acknowledges understanding/In group clarification offered/Needs additional education.   Clinical Observations/Feedback: Pt was engaged but was giving off the wall answers.  Pt was also feeding off of peers.  Pt stated monopoly could be used as a Associate Professorcoping skill for finances.  Pt was pleasant during group.   Caroll RancherMarjette Nguyen Todorov, LRT/CTRS         Caroll RancherLindsay, Arayna Illescas A 06/21/2018 12:54 PM

## 2018-06-22 MED ORDER — RISPERIDONE 3 MG PO TABS
3.0000 mg | ORAL_TABLET | Freq: Two times a day (BID) | ORAL | Status: DC
  Administered 2018-06-22 – 2018-06-24 (×5): 3 mg via ORAL
  Filled 2018-06-22 (×9): qty 1

## 2018-06-22 MED ORDER — ARIPIPRAZOLE 2 MG PO TABS
2.0000 mg | ORAL_TABLET | Freq: Once | ORAL | Status: AC
  Administered 2018-06-22: 2 mg via ORAL
  Filled 2018-06-22 (×2): qty 1

## 2018-06-22 NOTE — [facility] Group Notes (Signed)
LCSW Group Therapy Note 06/22/2018 12:12 PM  Type of Therapy and Topic: Group Therapy: DBT House  Participation Level: Active   Description of Group:  In this group patients will be encouraged to explore  their values, behaviors they want to change, emotions they wish to increase, protective factors, supports, coping skills, and motivational factors. They will be guided to discuss their thoughts, feelings, and behaviors related to these obstacles. The group will be asked to individually process the activity and share their insights with the group. This group will be process-oriented, with patients participating in exploration of their own experiences as well as giving and receiving support and challenge from other group members.   Therapeutic Goals: 1. Patient will identify their values 2. Patient will identify behaviors they wish to modify  3. Patient will identify feelings and emotions they wish to increase 4. Patient will identify strengths, supports, protective factors 5. Patient will identify coping skills 6. Patient will identify goals and motivating factors for change   Summary of Patient Progress Patient remained present for the group and listened quietly for most of group. Patient responded off topic to some prompts and gave indirect or confusing answers at times.    Therapeutic Modalities:  Dialectical Behavioral Therapy  Motivational Interviewing Relapse Prevention Therapy

## 2018-06-22 NOTE — Progress Notes (Signed)
Nursing Progress Note: 7p-7a D: Pt currently presents with a depressed/anxious/blaming others/concrete affect and behavior. Pt states "I had a terrible day. It sucks to be here. Days only get worse when your me." Not Interacting with the milieu. Pt reports poor sleep during the previous night with current medication regimen. Pt did not attend wrap-up group.  A: Pt provided with medications per providers orders. Pt's labs and vitals were monitored throughout the night. Pt supported emotionally and encouraged to express concerns and questions. Pt educated on medications.  R: Pt's safety ensured with 15 minute and environmental checks. Pt currently denies SI, HI, and VH and endorses AH. Pt verbally contracts to seek staff if SI,HI, or AVH occurs and to consult with staff before acting on any harmful thoughts. Will continue to monitor.

## 2018-06-22 NOTE — Progress Notes (Signed)
Recreation Therapy Notes  Date: 12.10.19 Time: 1000 Location: 500 Hall Dayroom  Group Topic: Wellness  Goal Area(s) Addresses:  Patient will define components of whole wellness. Patient will verbalize benefit of whole wellness.  Behavioral Response: Engaged  Intervention: Music  Activity: Exercise.  LRT and patients went through a series of stretches to loosen up their muscles and relieve some stress from their bodies.  Education: Wellness, Building control surveyorDischarge Planning.   Education Outcome: Acknowledges education/In group clarification offered/Needs additional education.   Clinical Observations/Feedback:  Pt was very engaged and completed all the stretches.  Pt was pleasant and social with LRT during the activity.      Caroll RancherMarjette Lenor Knox, LRT/CTRS     Lillia AbedLindsay, Anthony Knox A 06/22/2018 11:08 AM
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
## 2018-06-22 NOTE — Progress Notes (Addendum)
CSW met with patient in dayroom to discuss an ACT Team referral. Patient states "that'll be alright," patient says he has no preference for ACTT provider. CSW to make ACTT referral.   CSW also inquired if patient wanted to meet with CSW and his brother, [name], prior to discharge. Patient asked "why does he have to be involved?" CSW explained [name] asked if it was possible, but it is the patient's decision. Patient said "okay, I guess." CSW will follow up with brother at a later time.   Update 1:20pm  CSW faxed referral to [facility] and called to follow up with referral, left voicemail.  Phone: [phone]  Fax: [phone]    Update 4:10pm  CSW returned brother David's calls and updated him of ACTT referral.   Stephanie Acre, [place] Social Worker [phone]

## 2018-06-22 NOTE — Plan of Care (Signed)
Problem: Activity: Goal: Interest or engagement in activities will improve Outcome: Progressing   Problem: Safety: Goal: Periods of time without injury will increase Outcome: Progressing DAR Note: Pt awake in dayroom at this time. Visible in hall on initial approach. Denies SI, HI, AVH and pain when assessed. Per pt "I'm just tired and still sleepy, want to sleep some more". Pt only slept 2 hours last night per report and sleep log. Rates his depression 7/10, anxiety 7/10 and hopelessness 2/10. Reports scheduled medications is helpful  when approached about anxiety. Pt has been medication compliant. Denies side effects at this time.  Emotional support offered to pt. Encouraged pt to voice concerns, attend to ADLs and comply with current treatment regimen including groups. All medications administered as ordered with verbal education and effects monitored. Pt tolerated all meals and fluids well. Cooperative with care. Denies concerns at this time. Safety maintained on and off unit without outburst to note thus far.

## 2018-06-22 NOTE — Progress Notes (Addendum)
Pt up to the nursing station stating he has the flu " I feel run down and I sleep a lot " " I've been around a lot of people , maybe I need to go outside" " Maybe I need some antibiotics" pt was informed that if he had the flu that fluids and rest would be his best thing to do now. Pt was informed that if he has anymore symptoms that we could evaluate it further .

## 2018-06-22 NOTE — Progress Notes (Signed)
Patient ID: Anthony Knox, male   DOB: 05-08-1961, 57 y.o.   MRN: [id number] PER STATE REGULATIONS 482.30  THIS CHART WAS REVIEWED FOR MEDICAL NECESSITY WITH RESPECT TO THE PATIENT'S ADMISSION/ DURATION OF STAY.  NEXT REVIEW DATE: 06/26/2018  Willa RoughJENNIFER JONES Jennifermarie Franzen, RN, BSN CASE MANAGER

## 2018-06-22 NOTE — Progress Notes (Signed)
[facility] MD Progress Note  06/22/2018 6:49 AM Anthony Knox  MRN:  [id number] Subjective:    Over the past 24 hours patient has generally become more organized he is able to be more conversant and only after prolonged questioning do his answers deteriorate to more random and less relevant information. States he believes the medication is strong and he is sleeping very soundly and he is indeed on high-dose Risperdal we can lower the dose I discussed with him that we did start with a higher dose because of the severity of his pathology but we can go from 9 to 6 mg probably at this point as long as he sleeping well. Also added a mood stabilizer Also discussed the use of Abilify is long-acting injectable, as nursing notes and social work notes indicate chronic noncompliance and the long-acting injectable on formulary here is aripiprazole. This is indicated for bipolar disorder as well  Principal Problem: <principal problem not specified> Diagnosis: Active Problems:   MDD (major depressive disorder), recurrent episode, severe (HCC)  Total Time spent with patient: 20 minutes  Past Psychiatric History: New information regarding compliance/chronic noncompliance  Past Medical History: No past medical history on file. No past surgical history on file. Family History: No family history on file. Family Psychiatric  History: no new data Social History:  Social History   Substance and Sexual Activity  Alcohol Use Not Currently     Social History   Substance and Sexual Activity  Drug Use Never    Social History   Socioeconomic History  . Marital status: Single    Spouse name: Not on file  . Number of children: Not on file  . Years of education: Not on file  . Highest education level: Not on file  Occupational History  . Not on file  Social Needs  . Financial resource strain: Not on file  . Food insecurity:    Worry: Not on file    Inability: Not on file  . Transportation needs:   Medical: Not on file    Non-medical: Not on file  Tobacco Use  . Smoking status: Unknown If Ever Smoked  Substance and Sexual Activity  . Alcohol use: Not Currently  . Drug use: Never  . Sexual activity: Not on file  Lifestyle  . Physical activity:    Days per week: Not on file    Minutes per session: Not on file  . Stress: Not on file  Relationships  . Social connections:    Talks on phone: Not on file    Gets together: Not on file    Attends religious service: Not on file    Active member of club or organization: Not on file    Attends meetings of clubs or organizations: Not on file    Relationship status: Not on file  Other Topics Concern  . Not on file  Social History Narrative  . Not on file   Additional Social History:                         Sleep: Fair  Appetite:  Fair  Current Medications: Current Facility-Administered Medications  Medication Dose Route Frequency Provider Last Rate Last Dose  . acetaminophen (TYLENOL) tablet 650 mg  650 mg Oral Q6H PRN Kerry Hough, PA-C   650 mg at 06/22/18 0235  . alum & mag hydroxide-simeth (MAALOX/MYLANTA) 200-200-20 MG/5ML suspension 30 mL  30 mL Oral Q4H PRN Kerry Hough, PA-C      .  ARIPiprazole (ABILIFY) tablet 2 mg  2 mg Oral Once Malvin Johns, MD      . benztropine (COGENTIN) tablet 0.5 mg  0.5 mg Oral BID Malvin Johns, MD   0.5 mg at 06/21/18 1645  . carbamazepine (TEGRETOL) tablet 200 mg  200 mg Oral TID Malvin Johns, MD   200 mg at 06/21/18 1646  . diphenhydrAMINE (BENADRYL) capsule 25 mg  25 mg Oral BID Malvin Johns, MD   25 mg at 06/21/18 2048  . hydrOXYzine (ATARAX/VISTARIL) tablet 50 mg  50 mg Oral Q4H PRN Armandina Stammer I, NP   50 mg at 06/21/18 2048  . OLANZapine zydis (ZYPREXA) disintegrating tablet 5 mg  5 mg Oral Once Armandina Stammer I, NP   Stopped at 06/20/18 1441   Or  . LORazepam (ATIVAN) tablet 1 mg  1 mg Oral Once Armandina Stammer I, NP       Or  . ziprasidone (GEODON) injection 20 mg  20 mg  Intramuscular Once Nwoko, Nicole Kindred I, NP      . magnesium hydroxide (MILK OF MAGNESIA) suspension 30 mL  30 mL Oral Daily PRN Kerry Hough, PA-C      . prenatal multivitamin tablet 1 tablet  1 tablet Oral Q1200 Malvin Johns, MD   1 tablet at 06/21/18 1212  . risperiDONE (RISPERDAL) tablet 3 mg  3 mg Oral BID Malvin Johns, MD      . temazepam (RESTORIL) capsule 30 mg  30 mg Oral QHS Malvin Johns, MD   30 mg at 06/21/18 2048    Lab Results: No results found for this or any previous visit (from the past 48 hour(s)).  Blood Alcohol level:  Lab Results  Component Value Date   ETH <10 06/18/2018   ETH <10 05/05/2018    Metabolic Disorder Labs: Lab Results  Component Value Date   HGBA1C 5.9 (H) 05/08/2018   MPG 122.63 05/08/2018   Lab Results  Component Value Date   PROLACTIN 34.1 (H) 05/08/2018   Lab Results  Component Value Date   CHOL 141 05/08/2018   TRIG 91 05/08/2018   HDL 36 (L) 05/08/2018   CHOLHDL 3.9 05/08/2018   VLDL 18 05/08/2018   LDLCALC 87 05/08/2018    Physical Findings: AIMS: Facial and Oral Movements Muscles of Facial Expression: None, normal Lips and Perioral Area: None, normal Jaw: None, normal Tongue: None, normal,Extremity Movements Upper (arms, wrists, hands, fingers): None, normal Lower (legs, knees, ankles, toes): None, normal, Trunk Movements Neck, shoulders, hips: None, normal, Overall Severity Severity of abnormal movements (highest score from questions above): None, normal Incapacitation due to abnormal movements: None, normal Patient's awareness of abnormal movements (rate only patient's report): No Awareness, Dental Status Current problems with teeth and/or dentures?: No Does patient usually wear dentures?: No  CIWA:  CIWA-Ar Total: 0 COWS:  COWS Total Score: 0  Musculoskeletal: Strength & Muscle Tone: within normal limits Gait & Station: normal Patient leans: N/A  Psychiatric Specialty Exam: Physical Exam  ROS  Blood pressure  115/80, pulse 75, temperature 98 F (36.7 C), temperature source Oral, resp. rate 18, SpO2 99 %.There is no height or weight on file to calculate BMI.  General Appearance: Casual  Eye Contact:  Fair  Speech:  Clear and Coherent  Volume:  Normal  Mood:  Dysphoric  Affect:  Congruent  Thought Process:  Irrelevant  Orientation:  Full (Time, Place, and Person)  Thought Content:  Illogical  Suicidal Thoughts:  No  Homicidal Thoughts:  No  Memory:  Immediate;   Poor  Judgement:  Intact  Insight:  Fair  Psychomotor Activity:  Psychomotor Retardation  Concentration:  Concentration: Good  Recall:  Fair  Fund of Knowledge:  Good  Language:  Good  Akathisia:  Negative  Handed:  Right  AIMS (if indicated):     Assets:  Communication Skills  ADL's:  Intact  Cognition:  WNL  Sleep:  Number of Hours: 2    As above we will give a test dose of aripiprazole we will give lower dose of Risperdal and continue current measures and precautions and cognitive based therapy  Treatment Plan Summary: Daily contact with patient to assess and evaluate symptoms and progress in treatment  Humna Moorehouse, MD 06/22/2018, 6:49 AM

## 2018-06-23 MED ORDER — ARIPIPRAZOLE ER 400 MG IM SRER
400.0000 mg | Freq: Once | INTRAMUSCULAR | Status: AC
  Administered 2018-06-23: 400 mg via INTRAMUSCULAR

## 2018-06-23 NOTE — Progress Notes (Signed)
Adult Psychoeducational Group Note  Date:  06/23/2018 Time:  9:00 PM  Group Topic/Focus:  Wrap-Up Group:   The focus of this group is to help patients review their daily goal of treatment and discuss progress on daily workbooks.  Participation Level:  Active  Participation Quality:  Appropriate  Affect:  Appropriate  Cognitive:  Alert  Insight: Appropriate  Engagement in Group:  Engaged  Modes of Intervention:  Discussion  Additional Comments:  Pt stated that he is feeling depressed, but had a good visit with his brother. His goal is to continue to look for balance.   Kaleen OdeaCOOKE, Claude Waldman R 06/23/2018, 9:00 PM

## 2018-06-23 NOTE — Progress Notes (Signed)
Nursing Progress Note: 7p-7a D: Pt currently presents with a anxious/pleasant affect and behavior. Pt states "I feel great today. Voices are not as loud." Interacting appropriately with the milieu. Pt reports good sleep during the previous night with current medication regimen. Pt did attend wrap-up group.  A: Pt provided with medications per providers orders. Pt's labs and vitals were monitored throughout the night. Pt supported emotionally and encouraged to express concerns and questions. Pt educated on medications.  R: Pt's safety ensured with 15 minute and environmental checks. Pt currently denies SI, HI, and VH and endorses AH. Pt verbally contracts to seek staff if SI,HI, or AVH occurs and to consult with staff before acting on any harmful thoughts. Will continue to monitor.

## 2018-06-23 NOTE — Progress Notes (Signed)
[facility]  06/23/2018 10:01 AM Anthony Knox  MRN:  [id number] Subjective:   It seems to have recalibrated to more of his baseline although he is unable to articulate exactly how he knows he is better but he is making less delusional statements he is coherent and goal-directed in his thinking.  Less bizarre. Previous presentations have included a confusional state but this seems to have also resolved.  Probably at or very near baseline  The issues are going to be long-term compliance and long-term stability therefore we believe an ACT team would be the best next option for him we are also going administer long-acting injectable aripiprazole today he is understanding the risk benefits side effects and has had a test dose of aripiprazole and had no sensitivity to it  Principal Problem: Psychosis Diagnosis: Active Problems:   MDD (major depressive disorder), recurrent episode, severe (HCC)  Total Time spent with patient: 20 minutes  Past Medical History: No past medical history on file. No past surgical history on file. Family History: No family history on file. Social History:  Social History   Substance and Sexual Activity  Alcohol Use Not Currently     Social History   Substance and Sexual Activity  Drug Use Never    Social History   Socioeconomic History  . Marital status: Single    Spouse name: Not on file  . Number of children: Not on file  . Years of education: Not on file  . Highest education level: Not on file  Occupational History  . Not on file  Social Needs  . Financial resource strain: Not on file  . Food insecurity:    Worry: Not on file    Inability: Not on file  . Transportation needs:    Medical: Not on file    Non-medical: Not on file  Tobacco Use  . Smoking status: Unknown If Ever Smoked  Substance and Sexual Activity  . Alcohol use: Not Currently  . Drug use: Never  . Sexual activity: Not on file  Lifestyle  . Physical activity:   Days per week: Not on file    Minutes per session: Not on file  . Stress: Not on file  Relationships  . Social connections:    Talks on phone: Not on file    Gets together: Not on file    Attends religious service: Not on file    Active member of club or organization: Not on file    Attends meetings of clubs or organizations: Not on file    Relationship status: Not on file  Other Topics Concern  . Not on file  Social History Narrative  . Not on file    Sleep: Good  Appetite:  Good  Current Medications: Current Facility-Administered Medications  Medication Dose Route Frequency Provider Last Rate Last Dose  . acetaminophen (TYLENOL) tablet 650 mg  650 mg Oral Q6H PRN Kerry HoughSimon, Spencer E, PA-C   650 mg at 06/22/18 0235  . alum & mag hydroxide-simeth (MAALOX/MYLANTA) 200-200-20 MG/5ML suspension 30 mL  30 mL Oral Q4H PRN Donell SievertSimon, Spencer E, PA-C      . ARIPiprazole ER (ABILIFY MAINTENA) injection 400 mg  400 mg Intramuscular Once Malvin JohnsFarah, Dyann Goodspeed, MD      . benztropine (COGENTIN) tablet 0.5 mg  0.5 mg Oral BID Malvin JohnsFarah, Angelissa Supan, MD   0.5 mg at 06/23/18 0749  . carbamazepine (TEGRETOL) tablet 200 mg  200 mg Oral TID Malvin JohnsFarah, Shamiyah Ngu, MD   200 mg  at 06/23/18 0750  . diphenhydrAMINE (BENADRYL) capsule 25 mg  25 mg Oral BID Malvin Johns, MD   25 mg at 06/23/18 0749  . hydrOXYzine (ATARAX/VISTARIL) tablet 50 mg  50 mg Oral Q4H PRN Armandina Stammer I, NP   50 mg at 06/21/18 2048  . OLANZapine zydis (ZYPREXA) disintegrating tablet 5 mg  5 mg Oral Once Armandina Stammer I, NP   Stopped at 06/20/18 1441   Or  . LORazepam (ATIVAN) tablet 1 mg  1 mg Oral Once Armandina Stammer I, NP       Or  . ziprasidone (GEODON) injection 20 mg  20 mg Intramuscular Once Nwoko, Nicole Kindred I, NP      . magnesium hydroxide (MILK OF MAGNESIA) suspension 30 mL  30 mL Oral Daily PRN Kerry Hough, PA-C      . prenatal multivitamin tablet 1 tablet  1 tablet Oral Q1200 Malvin Johns, MD   1 tablet at 06/22/18 1154  . risperiDONE (RISPERDAL) tablet 3  mg  3 mg Oral BID Malvin Johns, MD   3 mg at 06/23/18 0749  . temazepam (RESTORIL) capsule 30 mg  30 mg Oral QHS Malvin Johns, MD   30 mg at 06/22/18 2138    Lab Results: No results found for this or any previous visit (from the past 48 hour(s)).  Blood Alcohol level:  Lab Results  Component Value Date   ETH <10 06/18/2018   ETH <10 05/05/2018    Metabolic Disorder Labs: Lab Results  Component Value Date   HGBA1C 5.9 (H) 05/08/2018   MPG 122.63 05/08/2018   Lab Results  Component Value Date   PROLACTIN 34.1 (H) 05/08/2018   Lab Results  Component Value Date   CHOL 141 05/08/2018   TRIG 91 05/08/2018   HDL 36 (L) 05/08/2018   CHOLHDL 3.9 05/08/2018   VLDL 18 05/08/2018   LDLCALC 87 05/08/2018    Physical Findings: AIMS: Facial and Oral Movements Muscles of Facial Expression: None, normal Lips and Perioral Area: None, normal Jaw: None, normal Tongue: None, normal,Extremity Movements Upper (arms, wrists, hands, fingers): None, normal Lower (legs, knees, ankles, toes): None, normal, Trunk Movements Neck, shoulders, hips: None, normal, Overall Severity Severity of abnormal movements (highest score from questions above): None, normal Incapacitation due to abnormal movements: None, normal Patient's awareness of abnormal movements (rate only patient's report): No Awareness, Dental Status Current problems with teeth and/or dentures?: No Does patient usually wear dentures?: No  CIWA:  CIWA-Ar Total: 0 COWS:  COWS Total Score: 0  Musculoskeletal: Strength & Muscle Tone: within normal limits Gait & Station: normal Patient leans: N/A  Psychiatric Specialty Exam: Physical Exam  ROS  Blood pressure 116/79, pulse 79, temperature 99.5 F (37.5 C), temperature source Oral, resp. rate 18, SpO2 99 %.There is no height or weight on file to calculate BMI.  General Appearance: Casual  Eye Contact:  Good  Speech:  Clear and Coherent  Volume:  Normal  Mood:  Euthymic  Affect:   Appropriate and Congruent  Thought Process:  Linear  Orientation:  Full (Time, Place, and Person)  Thought Content:  Logical  Suicidal Thoughts:  No  Homicidal Thoughts:  No  Memory:  Immediate;   Fair  Judgement:  Fair  Insight:  Fair  Psychomotor Activity:  Negative  Concentration:  Concentration: Fair  Recall:  [employer] of Knowledge:  Fair  Language:  Fair  Akathisia:  Negative  Handed:  Right  AIMS (if indicated):  Assets:  Communication Skills  ADL's:  Intact  Cognition:  WNL  Sleep:  Number of Hours: 6    Plans are to continue current Risperdal but administer long-acting injectable but also referred to an act team  Treatment Plan Summary: Daily contact with patient to assess and evaluate symptoms and progress in treatment and Medication management  Liona Wengert, MD 06/23/2018, 10:01 AM

## 2018-06-23 NOTE — [facility] Group Notes (Signed)
[facility] Group Notes:  (Nursing/MHT/Case Management/Adjunct)  Date:  06/23/2018  Time:  1:15 PM  Type of Therapy:  Nurse Education  Participation Level:  Did Not Attend    Anthony MiyamotoMichael R [name] 06/23/2018, 3:54 PM

## 2018-06-23 NOTE — Therapy (Signed)
Occupational Therapy Group Note  Date:  06/23/2018 Time:  11:07 AM  Group Topic/Focus:  Yoga  Participation Level:  Active  Participation Quality:  Appropriate  Affect:  Blunted  Cognitive:  Appropriate  Insight: Improving  Engagement in Group:  Engaged  Modes of Intervention:  Activity, Discussion, Education and Socialization  Additional Comments:    S: "I have done yoga before, this is great"  O: Education given on stress management and relaxation to develop coping skills when reintegrating into community. Healthy stress management strategies brainstormed within group, pt encouraged to contribute responses. Pt guided through relaxation chair yoga. Pt asked if pain a factor in mobility, adapted exercises to meet mobility needs and safety.   A: Pt presents to group, with blunted affect. Pt engaged and participatory, sharing that he has previous yoga experience. Pt demonstrating all yoga poses safely, with notable improvement in affect and commenting on his enjoyment. Pt eager to accept handout at end of session.  P: OT will continue to follow up for implementation of coping skills while pt acute    Dalphine HandingKaylee Truxton Stupka, MSOT, OTR/L Behavioral Health OT/ Acute Relief OT PHP Office: [phone]  Dalphine HandingKaylee Lambros Cerro 06/23/2018, 11:07 AM

## 2018-06-23 NOTE — Progress Notes (Addendum)
CSW faxed requested referral documents to [facility] for ACTT referral services. Anthony Knox with [facility] confirms receiving documents and states leadership will need to review prior to accepting the patient as a client.  CSW reminded [facility] that patient is expected to discharge tomorrow.    CSW discussed possible discharge with patient. Patient agreeable to discharge, states he has no concerns.   Patient plans to return to his home, with roommates. Patient says his brother, Anthony HuaDavid, can probably give him a ride. Patient has Medicare to cover medications. Declines quitline referral. SPE completed with brother, Anthony HuaDavid.  Enid Cutterharlotte Kevan Prouty, LCSW-A Clinical Social Worker

## 2018-06-23 NOTE — Progress Notes (Signed)
Recreation Therapy Notes  Date: 12.11.19 Time: 1000 Location: 500 Hall Dayroom   Group Topic: Communication, Team Building, Problem Solving  Goal Area(s) Addresses:  Patient will effectively work with peer towards shared goal.  Patient will identify skill used to make activity successful.  Patient will identify how skills used during activity can be used to reach post d/c goals.   Behavioral Response: Engaged  Intervention: STEM Activity   Activity: Elevated Bridge. Patients were provided the following materials: 20 straws and a long piece of masking tape.  Patients were divided into teams of 2.  Each group was to build an elevated that would hold a small paperback book.  Education: Pharmacist, communityocial Skills, Building control surveyorDischarge Planning.   Education Outcome: Acknowledges education/In group clarification offered/Needs additional education.   Clinical Observations/Feedback: Pt was appropriate and able to focus during activity.  Pt was little drowsy but engaged.  Pt worked well with his partner to come up with a concept for their bridge.    Caroll RancherMarjette Nimah Uphoff, LRT/CTRS     Caroll RancherLindsay, Nya Monds A 06/23/2018 11:14 AM

## 2018-06-23 NOTE — Plan of Care (Signed)
  Problem: Safety: Goal: Periods of time without injury will increase Outcome: Progressing   Problem: Education: Goal: Will be free of psychotic symptoms Outcome: Progressing   D: Pt alert and oriented on the unit. Pt engaging with RN staff and other pts. Pt denies SI/HI, A/VH. Pt also denies any pain at this time. Pt is pleasant and cooperative. Pt rated his anxiety, hopelessness, and depression all a 0, on a scale of 0 to 10 with 10 being the worst. A: Education, support and encouragement provided, q15 minute safety checks remain in effect. Medications administered per MD orders. R: No reactions/side effects to medicine noted. Pt denies any concerns at this time, and verbally contracts for safety. Pt ambulating on the unit with no issues. Pt remains safe on and off the unit.

## 2018-06-24 MED ORDER — HYDROXYZINE HCL 50 MG PO TABS: 50.0000 mg | ORAL_TABLET | ORAL | 0 refills | Status: DC | PRN

## 2018-06-24 MED ORDER — TEMAZEPAM 30 MG PO CAPS: 30.0000 mg | ORAL_CAPSULE | Freq: Every day | ORAL | 0 refills | Status: DC

## 2018-06-24 MED ORDER — ARIPIPRAZOLE ER 400 MG IM SRER: 400.0000 mg | INTRAMUSCULAR | Status: DC

## 2018-06-24 MED ORDER — CARBAMAZEPINE 200 MG PO TABS: 200.0000 mg | ORAL_TABLET | Freq: Three times a day (TID) | ORAL | 0 refills | Status: DC

## 2018-06-24 MED ORDER — DIPHENHYDRAMINE HCL 25 MG PO CAPS: 25.0000 mg | ORAL_CAPSULE | Freq: Two times a day (BID) | ORAL | 0 refills | Status: DC

## 2018-06-24 MED ORDER — BENZTROPINE MESYLATE 0.5 MG PO TABS: 0.5000 mg | ORAL_TABLET | Freq: Two times a day (BID) | ORAL | 0 refills | Status: DC

## 2018-06-24 MED ORDER — ARIPIPRAZOLE ER 400 MG IM SRER: 400.0000 mg | INTRAMUSCULAR | 0 refills | Status: DC

## 2018-06-24 MED ORDER — RISPERIDONE 3 MG PO TABS: 3.0000 mg | ORAL_TABLET | Freq: Two times a day (BID) | ORAL | 0 refills | Status: DC
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
## 2018-06-24 NOTE — Plan of Care (Signed)
Pt attended and participated in each recreation therapy group session.   Caroll RancherMarjette Chevez Sambrano, LRT/CTRS

## 2018-06-24 NOTE — Progress Notes (Signed)
Patient ID: Anthony Knox, male   DOB: 10/12/1960, 57 y.o.   MRN: [id number] Patient discharged to home/self care on his own accord.  Patient denies SI, HI and AVH upon discharge.  Patient acknowledged understanding of all discharge instructions and receipt of all belongings

## 2018-06-24 NOTE — Progress Notes (Addendum)
  [facility] Discharge [facility] :  Will you be returning to the same living situation after discharge:  Yes,  home At discharge, do you have transportation home?: Yes,  brother will pick him up this afternoon. Do you have the ability to pay for your medications: Yes,  medicare  Release of information consent forms completed and submitted to medical records by CSW.   Patient to Follow up at: Follow-up Information    [facility] Follow up.   Specialty:  Professional Counselor Why:  Please walk in for a therapy appointment between Monday-Friday 8:00a-12:00p and 1:00p.-2:30p. Please bring: photo ID, proof of insurance, and any discharge paperwork from this hospitalization.  Contact information: [facility] [address] [place] [address] [phone]        [facility] Go on 06/30/2018.   Specialty:  Behavioral Health Why:  Please attend your hospital follow up appointment on Wednesday, 06/30/18 at 8:30a. Bring: photo ID, proof of insurance, social security card, current medications, and discharge paperwork from this hospitalization.  Contact information: [address] [place] [address] [phone]        [facility] Follow up on 06/28/2018.   Why:  Intake/assessment for ACT services on Monday, 12/16 at 11:00AM. ([address]). Please bring photo ID/insurance card. Thank you.  Contact information: [address] [place] [address] [phone]           Next level of care provider has [facility] to [facility] Link:no  Safety Planning and Suicide Prevention discussed: Yes,  SPE completed with pt and his brother. SPI pamphlet and mobile crisis information provided to pt.     Has patient been referred to the Quitline?: Patient refused referral  Patient has been referred for addiction treatment: Yes  Rona RavensHeather S Leylah Tarnow, LCSW 06/24/2018, 12:32 PM

## 2018-06-24 NOTE — [facility] Suicide Risk Assessment (Signed)
[facility]   Principal Problem: MDD (major depressive disorder), recurrent episode, severe (HCC) Discharge Diagnoses: Principal Problem:   MDD (major depressive disorder), recurrent episode, severe (HCC)   Total Time spent with patient: 30 minutes  Musculoskeletal: Strength & Muscle Tone: within normal limits Gait & Station: normal Patient leans: N/A  Psychiatric Specialty Exam: ROS  Blood pressure 109/89, pulse 80, temperature 98.8 F (37.1 C), temperature source Oral, resp. rate 16, SpO2 99 %.There is no height or weight on file to calculate BMI.  General Appearance: Casual  Eye Contact::  Minimal  Speech:  Clear and Coherent409  Volume:  Normal  Mood:  Euthymic  Affect:  Constricted  Thought Process:  Coherent  Orientation:  Full (Time, Place, and Person)  Thought Content:  Logical  Suicidal Thoughts:  No  Homicidal Thoughts:  No  Memory:  Immediate;   Fair  Judgement:  Intact  Insight:  Good  Psychomotor Activity:  Normal  Concentration:  Good  Recall:  Good  Fund of Knowledge:Good  Language: Good  Akathisia:  Negative  Handed:  Right  AIMS (if indicated):     Assets:  Communication Skills Desire for Improvement  Sleep:  Number of Hours: 3.75  Cognition: WNL  ADL's:  Intact   Mental Status Per Nursing Assessment::   On Admission:  NA  Demographic Factors:  Male  Loss Factors: Decrease in vocational status  Historical Factors: Impulsivity  Risk Reduction Factors:   NA  Continued Clinical Symptoms:  More than one psychiatric diagnosis  Cognitive Features That Contribute To Risk:  None    Suicide Risk:  Minimal: No identifiable suicidal ideation.  Patients presenting with no risk factors but with morbid ruminations; may be classified as minimal risk based on the severity of the depressive symptoms  Follow-up Information    [facility] Follow up.   Specialty:  Professional Counselor Why:   Please walk in for a therapy appointment between Monday-Friday 8:00a-12:00p and 1:00p.-2:30p. Please bring: photo ID, proof of insurance, and any discharge paperwork from this hospitalization.  Contact information: [facility] [address] [place] [address] [phone]        [facility] Go on 06/30/2018.   Specialty:  Behavioral Health Why:  Please attend your hospital follow up appointment on Wednesday, 06/30/18 at 8:30a. Bring: photo ID, proof of insurance, social security card, current medications, and discharge paperwork from this hospitalization.  Contact information: [address] [place] [address] [phone]         In summary there is no acute dangerousness as far as acute psychosis that has resolved and no suicidal thinking plans or intent.  Has received long-acting injectable and stable for release  Plan Of Care/Follow-up recommendations:  Activity:  full  Analis Distler, MD 06/24/2018, 9:14 AM

## 2018-06-24 NOTE — Progress Notes (Signed)
Recreation Therapy Notes  INPATIENT RECREATION TR PLAN  Patient Details Name: Anthony Knox MRN: [id number] DOB: 1960/10/07 Today's Date: 06/24/2018  Rec Therapy Plan Is patient appropriate for Therapeutic Recreation?: Yes Treatment times per week: about 3 days Estimated Length of Stay: 5-7 days TR Treatment/Interventions: Group participation (Comment)  Discharge Criteria Pt will be discharged from therapy if:: Discharged Treatment plan/goals/alternatives discussed and agreed upon by:: Patient/family  Discharge Summary Short term goals set: See patient care plan Short term goals met: Complete Progress toward goals comments: Groups attended Which groups?: Wellness, Coping skills, Other (Comment)(Team building) Reason goals not met: None Therapeutic equipment acquired: N/A Reason patient discharged from therapy: Discharge from hospital Pt/family agrees with progress & goals achieved: Yes Date patient discharged from therapy: 06/24/18    Victorino Sparrow, LRT/CTRS  Ria Comment, Tavone Caesar A 06/24/2018, 11:48 AM
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
## 2020-08-02 ENCOUNTER — Encounter (HOSPITAL_COMMUNITY): Payer: Self-pay | Admitting: Emergency Medicine

## 2020-08-02 ENCOUNTER — Emergency Department (HOSPITAL_COMMUNITY)
Admission: EM | Admit: 2020-08-02 | Discharge: 2020-08-02 | Payer: PPO | Attending: Emergency Medicine | Admitting: Emergency Medicine

## 2020-08-02 DIAGNOSIS — Z139 Encounter for screening, unspecified: Secondary | ICD-10-CM | POA: Diagnosis not present

## 2020-08-02 DIAGNOSIS — F22 Delusional disorders: Secondary | ICD-10-CM | POA: Diagnosis not present

## 2020-08-02 HISTORY — DX: Schizophrenia, unspecified: F20.9

## 2020-08-02 NOTE — ED Provider Notes (Signed)
[facility]-EMERGENCY DEPT Provider Note: Lowella Dell, MD, FACEP  CSN: [id number] MRN: [id number] ARRIVAL: 08/02/20 at 2208 ROOM: [facility]   CHIEF COMPLAINT  Medical Clearance   HISTORY OF PRESENT ILLNESS  08/02/20 11:19 PM Anthony Knox is a 60 y.o. male with a history of schizophrenia.  He was brought to the ED by EMS because he was seeing a roaming around a [employer] store spitting tobacco.  He denies any intent to harm anybody else stating that is against his believes.  He is not interested in harming himself.  He is having pain in the left side of his tongue which he attributes to electric shocks from forces outside of our control, and to himself repeatedly biting that spot.  He acknowledges he is chewing tobacco.  He has been calm and cooperative with staff but refused to have his temperature taking stating that would give him COVID.   Past Medical History:  Diagnosis Date  . Schizophrenia (HCC)     No past surgical history on file.  No family history on file.  Social History   Tobacco Use  . Smoking status: Unknown If Ever Smoked  Vaping Use  . Vaping Use: Unknown  Substance Use Topics  . Alcohol use: Not Currently  . Drug use: Never    Prior to Admission medications   Medication Sig Start Date End Date Taking? Authorizing Provider  ARIPiprazole ER (ABILIFY MAINTENA) 400 MG SRER injection Inject 2 mLs (400 mg total) into the muscle every 28 (twenty-eight) days. 07/21/18   Money, Gerlene Burdock, FNP  benztropine (COGENTIN) 0.5 MG tablet Take 1 tablet (0.5 mg total) by mouth 2 (two) times daily. For EPS 06/24/18   Money, Gerlene Burdock, FNP  carbamazepine (TEGRETOL) 200 MG tablet Take 1 tablet (200 mg total) by mouth 3 (three) times daily. For mood control 06/24/18   Money, Gerlene Burdock, FNP  diphenhydrAMINE (BENADRYL) 25 mg capsule Take 1 capsule (25 mg total) by mouth 2 (two) times daily. anxiety 06/24/18   Money, Gerlene Burdock, FNP  hydrOXYzine (ATARAX/VISTARIL) 50 MG tablet Take 1  tablet (50 mg total) by mouth every 4 (four) hours as needed for anxiety (Sleep). 06/24/18   Money, Gerlene Burdock, FNP  risperiDONE (RISPERDAL) 3 MG tablet Take 1 tablet (3 mg total) by mouth 2 (two) times daily. For mood control 06/24/18   Money, Feliz Beam B, FNP  temazepam (RESTORIL) 30 MG capsule Take 1 capsule (30 mg total) by mouth at bedtime. 06/24/18   Money, Gerlene Burdock, FNP    Allergies Patient has no known allergies.   REVIEW OF SYSTEMS  Negative except as noted here or in the History of Present Illness.   PHYSICAL EXAMINATION  Initial Vital Signs Blood pressure (!) 137/111, pulse 77, resp. rate 18, SpO2 99 %.  Examination General: Well-developed, well-nourished male in no acute distress; appearance consistent with age of record HENT: normocephalic; atraumatic; shallow ulceration of left side of tongue; chewing tobacco in mouth Eyes: pupils equal, round and reactive to light; extraocular muscles intact Neck: supple Heart: regular rate and rhythm Lungs: clear to auscultation bilaterally Abdomen: soft; nondistended; nontender; bowel sounds present Extremities: No deformity; full range of motion; pulses normal Neurologic: Awake, alert and oriented to person, place and day of week; motor function intact in all extremities and symmetric; no facial droop Skin: Warm and dry Psychiatric: Calm, cooperative; no SI; no HI; paranoid thoughts but without evidence of violent intent   RESULTS  Summary of this visit's results, reviewed  and interpreted by myself:   EKG Interpretation  Date/Time:    Ventricular Rate:    PR Interval:    QRS Duration:   QT Interval:    QTC Calculation:   R Axis:     Text Interpretation:        Laboratory Studies: No results found for this or any previous visit (from the past 24 hour(s)). Imaging Studies: No results found.  ED COURSE and MDM  Nursing notes, initial and subsequent vitals signs, including pulse oximetry, reviewed and interpreted by  myself.  Vitals:   08/02/20 2316 08/02/20 2317  BP: (!) 137/111 (!) 137/111  Pulse: 77 80  Resp: 18 17  Temp:  98.5 F (36.9 C)  TempSrc:  Oral  SpO2: 99% 98%   Medications - No data to display  I do not believe the patient should be further held against his will, he tells me he would like to leave and that we keep repeating unnecessary things such as obtaining vitals.  He has no SI or HI and does not appear to be a threat at this time.  PROCEDURES  Procedures   ED DIAGNOSES     ICD-10-CM   1. Encounter for medical screening examination  Z13.9   2. Paranoid ideation (HCC)  F22        Anthony Commons, MD 08/02/20 2325

## 2020-08-02 NOTE — ED Triage Notes (Signed)
[place] [employer], bystanders contacted EMS because patient was seen roaming around [employer] spitting tobacco. Hx of schizophrenia, a&o x2.  EMS vitals:  BP 141/98 HR 89 SPO2 97% RA CBG 204

## 2020-12-06 ENCOUNTER — Other Ambulatory Visit: Payer: Self-pay

## 2020-12-06 ENCOUNTER — Emergency Department (HOSPITAL_COMMUNITY): Payer: PPO

## 2020-12-06 ENCOUNTER — Inpatient Hospital Stay (HOSPITAL_COMMUNITY)
Admission: EM | Admit: 2020-12-06 | Discharge: 2020-12-13 | DRG: 310 | Disposition: A | Discharge status: Other Acute Inpatient Hospital | Payer: PPO | Attending: Family Medicine | Admitting: Family Medicine

## 2020-12-06 ENCOUNTER — Ambulatory Visit (HOSPITAL_COMMUNITY)
Admission: RE | Admit: 2020-12-06 | Discharge: 2020-12-06 | Disposition: A | Discharge status: Home / Self Care | Payer: Self-pay | Attending: Psychiatry | Admitting: Psychiatry

## 2020-12-06 DIAGNOSIS — Z9114 Patient's other noncompliance with medication regimen: Secondary | ICD-10-CM | POA: Diagnosis not present

## 2020-12-06 DIAGNOSIS — R45850 Homicidal ideations: Secondary | ICD-10-CM | POA: Diagnosis not present

## 2020-12-06 DIAGNOSIS — G4700 Insomnia, unspecified: Secondary | ICD-10-CM | POA: Diagnosis present

## 2020-12-06 DIAGNOSIS — K5900 Constipation, unspecified: Secondary | ICD-10-CM | POA: Diagnosis not present

## 2020-12-06 DIAGNOSIS — R001 Bradycardia, unspecified: Secondary | ICD-10-CM | POA: Diagnosis present

## 2020-12-06 DIAGNOSIS — F250 Schizoaffective disorder, bipolar type: Secondary | ICD-10-CM

## 2020-12-06 DIAGNOSIS — E876 Hypokalemia: Secondary | ICD-10-CM | POA: Diagnosis not present

## 2020-12-06 DIAGNOSIS — R7303 Prediabetes: Secondary | ICD-10-CM | POA: Diagnosis not present

## 2020-12-06 DIAGNOSIS — Z9189 Other specified personal risk factors, not elsewhere classified: Secondary | ICD-10-CM | POA: Diagnosis not present

## 2020-12-06 DIAGNOSIS — R402411 Glasgow coma scale score 13-15, in the field [EMT or ambulance]: Secondary | ICD-10-CM | POA: Diagnosis not present

## 2020-12-06 DIAGNOSIS — R402431 Glasgow coma scale score 3-8, in the field [EMT or ambulance]: Secondary | ICD-10-CM | POA: Diagnosis not present

## 2020-12-06 DIAGNOSIS — Z20822 Contact with and (suspected) exposure to covid-19: Secondary | ICD-10-CM | POA: Diagnosis present

## 2020-12-06 DIAGNOSIS — I517 Cardiomegaly: Secondary | ICD-10-CM | POA: Diagnosis not present

## 2020-12-06 DIAGNOSIS — R410 Disorientation, unspecified: Secondary | ICD-10-CM | POA: Diagnosis not present

## 2020-12-06 DIAGNOSIS — F29 Unspecified psychosis not due to a substance or known physiological condition: Secondary | ICD-10-CM

## 2020-12-06 DIAGNOSIS — Z79899 Other long term (current) drug therapy: Secondary | ICD-10-CM | POA: Diagnosis not present

## 2020-12-06 DIAGNOSIS — I4891 Unspecified atrial fibrillation: Principal | ICD-10-CM

## 2020-12-06 DIAGNOSIS — E038 Other specified hypothyroidism: Secondary | ICD-10-CM | POA: Diagnosis not present

## 2020-12-06 DIAGNOSIS — I959 Hypotension, unspecified: Secondary | ICD-10-CM | POA: Diagnosis not present

## 2020-12-06 DIAGNOSIS — R404 Transient alteration of awareness: Secondary | ICD-10-CM | POA: Diagnosis not present

## 2020-12-06 DIAGNOSIS — I480 Paroxysmal atrial fibrillation: Secondary | ICD-10-CM | POA: Diagnosis not present

## 2020-12-06 DIAGNOSIS — I499 Cardiac arrhythmia, unspecified: Secondary | ICD-10-CM | POA: Diagnosis not present

## 2020-12-06 DIAGNOSIS — R55 Syncope and collapse: Secondary | ICD-10-CM | POA: Diagnosis not present

## 2020-12-06 DIAGNOSIS — R32 Unspecified urinary incontinence: Secondary | ICD-10-CM | POA: Diagnosis not present

## 2020-12-06 DIAGNOSIS — I4819 Other persistent atrial fibrillation: Secondary | ICD-10-CM | POA: Diagnosis not present

## 2020-12-06 LAB — URINALYSIS, ROUTINE W REFLEX MICROSCOPIC
Bilirubin Urine: NEGATIVE
Glucose, UA: NEGATIVE mg/dL
Hgb urine dipstick: NEGATIVE
Ketones, ur: 5 mg/dL — AB
Leukocytes,Ua: NEGATIVE
Nitrite: NEGATIVE
Protein, ur: NEGATIVE mg/dL
Specific Gravity, Urine: 1.005 (ref 1.005–1.030)
pH: 6 (ref 5.0–8.0)

## 2020-12-06 LAB — BASIC METABOLIC PANEL
Anion gap: 8 (ref 5–15)
BUN: 12 mg/dL (ref 6–20)
CO2: 22 mmol/L (ref 22–32)
Calcium: 8.1 mg/dL — ABNORMAL LOW (ref 8.9–10.3)
Chloride: 109 mmol/L (ref 98–111)
Creatinine, Ser: 1.06 mg/dL (ref 0.61–1.24)
GFR, Estimated: >60 mL/min (ref >60)
Glucose, Bld: 150 mg/dL — ABNORMAL HIGH (ref 70–99)
Potassium: 3.3 mmol/L — ABNORMAL LOW (ref 3.5–5.1)
Sodium: 139 mmol/L (ref 135–145)

## 2020-12-06 LAB — RAPID URINE DRUG SCREEN, HOSP PERFORMED
Amphetamines: NOT DETECTED
Barbiturates: NOT DETECTED
Benzodiazepines: NOT DETECTED
Cocaine: NOT DETECTED
Opiates: NOT DETECTED
Tetrahydrocannabinol: NOT DETECTED

## 2020-12-06 LAB — CBC
HCT: 45.1 % (ref 39.0–52.0)
Hemoglobin: 15 g/dL (ref 13.0–17.0)
MCH: 29.9 pg (ref 26.0–34.0)
MCHC: 33.3 g/dL (ref 30.0–36.0)
MCV: 89.8 fL (ref 80.0–100.0)
Platelets: 155 10*3/uL (ref 150–400)
RBC: 5.02 MIL/uL (ref 4.22–5.81)
RDW: 14.1 % (ref 11.5–15.5)
WBC: 6.6 10*3/uL (ref 4.0–10.5)
nRBC: 0 % (ref 0.0–0.2)

## 2020-12-06 LAB — I-STAT VENOUS BLOOD GAS, ED
Acid-base deficit: 4 mmol/L — ABNORMAL HIGH (ref 0.0–2.0)
Bicarbonate: 20.8 mmol/L (ref 20.0–28.0)
Calcium, Ion: 1.11 mmol/L — ABNORMAL LOW (ref 1.15–1.40)
HCT: 43 % (ref 39.0–52.0)
Hemoglobin: 14.6 g/dL (ref 13.0–17.0)
O2 Saturation: 86 %
Potassium: 4.1 mmol/L (ref 3.5–5.1)
Sodium: 146 mmol/L — ABNORMAL HIGH (ref 135–145)
TCO2: 22 mmol/L (ref 22–32)
pCO2, Ven: 35.7 mmHg — ABNORMAL LOW (ref 44.0–60.0)
pH, Ven: 7.374 (ref 7.250–7.430)
pO2, Ven: 53 mmHg — ABNORMAL HIGH (ref 32.0–45.0)

## 2020-12-06 LAB — RESP PANEL BY RT-PCR (FLU A&B, COVID) ARPGX2
Influenza A by PCR: NEGATIVE
Influenza B by PCR: NEGATIVE
SARS Coronavirus 2 by RT PCR: NEGATIVE

## 2020-12-06 LAB — SALICYLATE LEVEL: Salicylate Lvl: <7 mg/dL — ABNORMAL LOW (ref 7.0–30.0)

## 2020-12-06 LAB — TSH: TSH: 9.441 u[IU]/mL — ABNORMAL HIGH (ref 0.350–4.500)

## 2020-12-06 LAB — TROPONIN I (HIGH SENSITIVITY)
Troponin I (High Sensitivity): 3 ng/L (ref <18)
Troponin I (High Sensitivity): 3 ng/L (ref <18)

## 2020-12-06 LAB — MAGNESIUM: Magnesium: 1.9 mg/dL (ref 1.7–2.4)

## 2020-12-06 LAB — PROTIME-INR
INR: 1 (ref 0.8–1.2)
Prothrombin Time: 13.6 seconds (ref 11.4–15.2)

## 2020-12-06 LAB — CARBAMAZEPINE LEVEL, TOTAL: Carbamazepine Lvl: <2 ug/mL — ABNORMAL LOW (ref 4.0–12.0)

## 2020-12-06 LAB — LACTIC ACID, PLASMA: Lactic Acid, Venous: 1.2 mmol/L (ref 0.5–1.9)

## 2020-12-06 LAB — ACETAMINOPHEN LEVEL: Acetaminophen (Tylenol), Serum: <10 ug/mL — ABNORMAL LOW (ref 10–30)

## 2020-12-06 MED ORDER — HEPARIN (PORCINE) 25000 UT/250ML-% IV SOLN
1100.0000 [IU]/h | INTRAVENOUS | Status: DC
  Administered 2020-12-06: 1100 [IU]/h via INTRAVENOUS
  Filled 2020-12-06: qty 250

## 2020-12-06 MED ORDER — DILTIAZEM LOAD VIA INFUSION
20.0000 mg | Freq: Once | INTRAVENOUS | Status: AC
  Administered 2020-12-06: 10 mg via INTRAVENOUS
  Filled 2020-12-06: qty 20

## 2020-12-06 MED ORDER — SODIUM CHLORIDE 0.9 % IV BOLUS
1000.0000 mL | Freq: Once | INTRAVENOUS | Status: AC
  Administered 2020-12-06: 1000 mL via INTRAVENOUS

## 2020-12-06 MED ORDER — ACETAMINOPHEN 650 MG RE SUPP: 650.0000 mg | Freq: Four times a day (QID) | RECTAL | Status: DC | PRN

## 2020-12-06 MED ORDER — ACETAMINOPHEN 325 MG PO TABS: 650.0000 mg | ORAL_TABLET | Freq: Four times a day (QID) | ORAL | Status: DC | PRN

## 2020-12-06 MED ORDER — ZIPRASIDONE MESYLATE 20 MG IM SOLR
20.0000 mg | Freq: Once | INTRAMUSCULAR | Status: AC
  Administered 2020-12-06: 20 mg via INTRAMUSCULAR
  Filled 2020-12-06: qty 20

## 2020-12-06 MED ORDER — HEPARIN BOLUS VIA INFUSION
4000.0000 [IU] | Freq: Once | INTRAVENOUS | Status: AC
  Administered 2020-12-06: 4000 [IU] via INTRAVENOUS
  Filled 2020-12-06: qty 4000

## 2020-12-06 MED ORDER — DILTIAZEM HCL-DEXTROSE 125-5 MG/125ML-% IV SOLN (PREMIX)
5.0000 mg/h | INTRAVENOUS | Status: DC
  Administered 2020-12-06: 5 mg/h via INTRAVENOUS
  Filled 2020-12-06: qty 125

## 2020-12-06 MED ORDER — SODIUM CHLORIDE 0.9 % IV SOLN: INTRAVENOUS | Status: DC

## 2020-12-06 MED ORDER — POTASSIUM CHLORIDE CRYS ER 20 MEQ PO TBCR: 40.0000 meq | EXTENDED_RELEASE_TABLET | Freq: Once | ORAL | Status: DC

## 2020-12-06 MED ORDER — STERILE WATER FOR INJECTION IJ SOLN
INTRAMUSCULAR | Status: AC
  Administered 2020-12-06: 10 mL
  Filled 2020-12-06: qty 10

## 2020-12-06 MED ORDER — HYDROXYZINE HCL 25 MG PO TABS
50.0000 mg | ORAL_TABLET | ORAL | Status: DC | PRN
Start: 2020-12-06 — End: 2020-12-13
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
## 2020-12-06 NOTE — ED Notes (Signed)
ED MD informed of BP drop. Orders to follow

## 2020-12-06 NOTE — ED Notes (Signed)
Spoke with 6E charge RN at this time, provided update on patient's activity and plan of care.

## 2020-12-06 NOTE — Progress Notes (Addendum)
Patient's HR during interview was 150s-160s and blood pressures in 120/80s. He was asymptomatic.  After leaving the room, his BP decreased into 70s/50s and per nurse report, his heart rate now decreased to 50-60s.  Messaged cardiology again for recs and dilt drip held for now.  Started IV bolus and ordered repeat ECG to assess if still in afib.  Went to assess patient- acute change since about 20 minutes prior. He is now pale, slurring, and less alert than previously. Fluids are running with heparin. dilt has been stopped. His HR in room is 35-60 and still afib on monitor.  - page to CCM.

## 2020-12-06 NOTE — Progress Notes (Signed)
Unable to complete PSE, Patient had a medical emergency

## 2020-12-06 NOTE — ED Notes (Signed)
Patient became aggressive, started yelling RN and brother. Demanding that they leave room. Patient ripped off BP cuff. ED MD made aware.

## 2020-12-06 NOTE — ED Notes (Signed)
Patient refusing to let this RN work with him at this time, another RN brought in to assist in medication administration.

## 2020-12-06 NOTE — ED Notes (Signed)
Per Jessie Foot, states patient was a walk in at [facility] changing "colors" and non responsive to verbal clues-EMS called-they were not able to assess him

## 2020-12-06 NOTE — ED Triage Notes (Signed)
Patient to ED from [facility], per EMS patient was there for psych eval, went unresponsive, and "passed out". Patient had a reported BP of systolic 70s, received 750cc fluid bolus, has been confused, A. Fib w/ RVR, unknown history.

## 2020-12-06 NOTE — ED Provider Notes (Signed)
[facility] EMERGENCY DEPARTMENT Provider Note   CSN: [id number] Arrival date & time: 12/06/20  1657     History Chief Complaint  Patient presents with  . Loss of Consciousness    Anthony Knox is a 60 y.o. male.  Pt presents to the ED today with a syncopal episode.  EMS was called to [facility] for this patient who presented there for a psych eval.  Per EMS, pt had a syncopal event while there.  SBP initially in the 70s.  EMS noted that pt was in afib.  Pt has no hx of afib per chart, but he is unable to say if he's had it in the past.  Pt does have a hx of schizophrenia and just got out of jail a few days ago.  He has not been taking any meds since d/c, but told EMS he took a pill called "chlamydia" today.  He told me he took a pill called "Tauriel Scronce."  He did tell the nurse he was suicidal.        Past Medical History:  Diagnosis Date  . Schizophrenia [facility])     Patient Active Problem List   Diagnosis Date Noted  . MDD (major depressive disorder), recurrent episode, severe (HCC) 06/18/2018  . Schizoaffective disorder (HCC) 05/07/2018  . Psychosis (HCC) 05/06/2018  . Disorientation   . Scalp laceration   . ANKLE PAIN 11/13/2008    No past surgical history on file.     No family history on file.  Social History   Tobacco Use  . Smoking status: Unknown If Ever Smoked  Vaping Use  . Vaping Use: Unknown  Substance Use Topics  . Alcohol use: Not Currently  . Drug use: Never    Home Medications Prior to Admission medications   Medication Sig Start Date End Date Taking? Authorizing Provider  ARIPiprazole ER (ABILIFY MAINTENA) 400 MG SRER injection Inject 2 mLs (400 mg total) into the muscle every 28 (twenty-eight) days. 07/21/18   Money, Gerlene Burdock, FNP  benztropine (COGENTIN) 0.5 MG tablet Take 1 tablet (0.5 mg total) by mouth 2 (two) times daily. For EPS 06/24/18   Money, Gerlene Burdock, FNP  carbamazepine (TEGRETOL) 200 MG tablet Take 1 tablet  (200 mg total) by mouth 3 (three) times daily. For mood control 06/24/18   Money, Gerlene Burdock, FNP  diphenhydrAMINE (BENADRYL) 25 mg capsule Take 1 capsule (25 mg total) by mouth 2 (two) times daily. anxiety 06/24/18   Money, Gerlene Burdock, FNP  hydrOXYzine (ATARAX/VISTARIL) 50 MG tablet Take 1 tablet (50 mg total) by mouth every 4 (four) hours as needed for anxiety (Sleep). 06/24/18   Money, Gerlene Burdock, FNP  risperiDONE (RISPERDAL) 3 MG tablet Take 1 tablet (3 mg total) by mouth 2 (two) times daily. For mood control 06/24/18   Money, Feliz Beam B, FNP  temazepam (RESTORIL) 30 MG capsule Take 1 capsule (30 mg total) by mouth at bedtime. 06/24/18   Money, Gerlene Burdock, FNP    Allergies    Patient has no known allergies.  Review of Systems   Review of Systems  Psychiatric/Behavioral: Positive for suicidal ideas.  All other systems reviewed and are negative.   Physical Exam Updated Vital Signs BP 106/81   Pulse 98   Temp 97.8 F (36.6 C) (Oral)   Resp 15   Ht 5\' 7"  (1.702 m)   Wt 86.2 kg   SpO2 95%   BMI 29.76 kg/m   Physical Exam Vitals and nursing note  reviewed.  Constitutional:      Appearance: Normal appearance.  HENT:     Head: Normocephalic and atraumatic.     Right Ear: External ear normal.     Left Ear: External ear normal.     Nose: Nose normal.     Mouth/Throat:     Mouth: Mucous membranes are moist.     Pharynx: Oropharynx is clear.  Eyes:     Extraocular Movements: Extraocular movements intact.     Conjunctiva/sclera: Conjunctivae normal.     Pupils: Pupils are equal, round, and reactive to light.  Cardiovascular:     Rate and Rhythm: Tachycardia present. Rhythm irregular.     Pulses: Normal pulses.     Heart sounds: Normal heart sounds.  Pulmonary:     Effort: Pulmonary effort is normal.     Breath sounds: Normal breath sounds.  Abdominal:     General: Abdomen is flat. Bowel sounds are normal.     Palpations: Abdomen is soft.  Musculoskeletal:        General: Normal  range of motion.     Cervical back: Normal range of motion and neck supple.  Skin:    General: Skin is warm.     Capillary Refill: Capillary refill takes less than 2 seconds.  Neurological:     General: No focal deficit present.     Mental Status: He is alert. He is disoriented.     Comments: Pt is moving all 4 extremities and is speaking well.  No vision disturbance.  I suspect disorientation is psych related.  Psychiatric:        Attention and Perception: Attention normal.        Mood and Affect: Affect is inappropriate.        Speech: Speech is delayed and tangential.        Thought Content: Thought content includes suicidal ideation.     ED Results / Procedures / Treatments   Labs (all labs ordered are listed, but only abnormal results are displayed) Labs Reviewed  BASIC METABOLIC PANEL - Abnormal; Notable for the following components:      Result Value   Potassium 3.3 (*)    Glucose, Bld 150 (*)    Calcium 8.1 (*)    All other components within normal limits  TSH - Abnormal; Notable for the following components:   TSH 9.441 (*)    All other components within normal limits  ACETAMINOPHEN LEVEL - Abnormal; Notable for the following components:   Acetaminophen (Tylenol), Serum <10 (*)    All other components within normal limits  SALICYLATE LEVEL - Abnormal; Notable for the following components:   Salicylate Lvl <7.0 (*)    All other components within normal limits  CARBAMAZEPINE LEVEL, TOTAL - Abnormal; Notable for the following components:   Carbamazepine Lvl <2.0 (*)    All other components within normal limits  RESP PANEL BY RT-PCR (FLU A&B, COVID) ARPGX2  MAGNESIUM  CBC  PROTIME-INR  URINALYSIS, ROUTINE W REFLEX MICROSCOPIC  RAPID URINE DRUG SCREEN, HOSP PERFORMED  HEPARIN LEVEL (UNFRACTIONATED)  CBC  TROPONIN I (HIGH SENSITIVITY)  TROPONIN I (HIGH SENSITIVITY)    EKG None  Radiology DG Chest Port 1 View  Result Date: 12/06/2020 CLINICAL DATA:  Atrial  fibrillation. EXAM: PORTABLE CHEST 1 VIEW COMPARISON:  08/08/2010 FINDINGS: Low lung volumes. Borderline cardiomegaly likely accentuated by technique. No pulmonary edema, focal airspace disease, large pleural effusion or pneumothorax. No acute osseous abnormalities are seen. IMPRESSION: Low lung volumes with  borderline cardiomegaly. Electronically Signed   By: Narda Rutherford M.D.   On: 12/06/2020 17:31    Procedures Procedures   Medications Ordered in ED Medications  diltiazem (CARDIZEM) 1 mg/mL load via infusion 20 mg (10 mg Intravenous Bolus from Bag 12/06/20 1740)    And  diltiazem (CARDIZEM) 125 mg in dextrose 5% 125 mL (1 mg/mL) infusion (5 mg/hr Intravenous New Bag/Given 12/06/20 1740)  heparin bolus via infusion 4,000 Units (has no administration in time range)  heparin ADULT infusion 100 units/mL (25000 units/255mL) (has no administration in time range)  sodium chloride 0.9 % bolus 1,000 mL (0 mLs Intravenous Stopped 12/06/20 1920)    ED Course  I have reviewed the triage vital signs and the nursing notes.  Pertinent labs & imaging results that were available during my care of the patient were reviewed by me and considered in my medical decision making (see chart for details).    MDM Rules/Calculators/A&P                          Pt given 10 mg cardizem bolus then placed on a drip.  BP dropped, but responded to 1L IVFs.  HR is now in the 80s.  We have no idea how long pt has been in afib and is not currently on thinners.  He is not a candidate for cardioversion.  Pt d/w FP residents for admission.  Pt's brother is here now.  He is updated on what is going on.  He is not sure if the pt was taking his meds in jail.  He said he is the one who brought him to the [facility].  He would like pt to be placed in assisted living to make sure he takes his meds because he does not take them unless forced.  CRITICAL CARE Performed by: Jacalyn Lefevre   Total critical care time: 30  minutes  Critical care time was exclusive of separately billable procedures and treating other patients.  Critical care was necessary to treat or prevent imminent or life-threatening deterioration.  Critical care was time spent personally by me on the following activities: development of treatment plan with patient and/or surrogate as well as nursing, discussions with consultants, evaluation of patient's response to treatment, examination of patient, obtaining history from patient or surrogate, ordering and performing treatments and interventions, ordering and review of laboratory studies, ordering and review of radiographic studies, pulse oximetry and re-evaluation of patient's condition.   Final Clinical Impression(s) / ED Diagnoses Final diagnoses:  Atrial fibrillation with RVR (HCC)  Psychosis, unspecified psychosis type (HCC)  Syncope, unspecified syncope type    Rx / DC Orders ED Discharge Orders    None       Jacalyn Lefevre, MD 12/06/20 1950

## 2020-12-06 NOTE — Consult Note (Signed)
Cardiology Consult    Patient ID: KYNDALL CHAPLIN MRN: [id number], DOB/AGE: 1961-07-08   Admit date: 12/06/2020 Date of Consult: 12/06/2020 Requesting Provider: Levert Feinstein, MD  PCP:  Patient, No Pcp Per (Inactive)   [facility] Providers Cardiologist:  None       Patient Profile    Anthony Knox is a 60 y.o. male with a history of schizoaffective disorder and major depressive disorder on whom we are consulted for evaluation of atrial fibrillation with rapid ventricular response.   History of Present Illness    He was difficult to obtain an adequate history from given tangential responses, active delusions, and combativeness and asked me to leave the room prior to completion of a full history or exam.  He denied a known prior history of atrial fibrillation.  Form chart review he had been doing okay but was brought to behavorial health urgent care today for feeling unwell.  He was apparently clammy and diaphoretic and had a transient loss of consciousness with hypotension and was found to be in atrial fibrillation with rapid ventricular response.  He is on no medications at this time but has been on multiple psychiatric medications previously.  He did deny having seen a cardiologist in the past.  He poorly tolerated an IV diltiazem infusion in the emergency department.  Despite a rate of 10 mg/hr and no boluses, his rates were uncontrolled and he had episodic hypotension to the 70s/40s.  On my initial evaluation blood pressure had recovered to 120/80 while he was emotionally labile.  It then dropped again and he was rate controlled to the 70s.  Instructed admitting service and nurse to stop diltiazem and, if he has persistently uncontrolled rates, start low dose metoprolol tartrate after washing out diltiazem for 6 hours.  Past Medical History   Past Medical History:  Diagnosis Date  . Schizophrenia (HCC)     No past surgical history on file.   No Known Allergies Inpatient  Medications    . heparin  4,000 Units Intravenous Once    Family History    Unable to obtain; noncooperative historian  Social History    Social History   Socioeconomic History  . Marital status: Single    Spouse name: Not on file  . Number of children: Not on file  . Years of education: Not on file  . Highest education level: Not on file  Occupational History  . Not on file  Tobacco Use  . Smoking status: Unknown If Ever Smoked  . Smokeless tobacco: Not on file  Vaping Use  . Vaping Use: Unknown  Substance and Sexual Activity  . Alcohol use: Not Currently  . Drug use: Never  . Sexual activity: Not on file  Other Topics Concern  . Not on file  Social History Narrative  . Not on file   Social Determinants of Health   Financial Resource Strain: Not on file  Food Insecurity: Not on file  Transportation Needs: Not on file  Physical Activity: Not on file  Stress: Not on file  Social Connections: Not on file  Intimate Partner Violence: Not on file     Review of Systems    Unable to perform full ROS due to cooperation but he did deny overt chest pain, shortness of breath, or orthopnea.  Physical Exam    Blood pressure 111/82, pulse (!) 114, temperature 97.8 F (36.6 C), temperature source Oral, resp. rate (!) 21, height 5\' 7"  (1.702 m), weight 86.2 kg,  SpO2 97 %.     Intake/Output Summary (Last 24 hours) at 12/06/2020 2021 Last data filed at 12/06/2020 1920 Gross per 24 hour  Intake 1001.01 ml  Output --  Net 1001.01 ml   Wt Readings from Last 3 Encounters:  12/06/20 86.2 kg  05/07/18 74.8 kg  05/05/18 81.6 kg    CONSTITUTIONAL: alert and conversant, well-appearing, nourished, no distress HEENT: normal NECK: JVP 8-10 cm H20. CARDIAC: Irregular rapid pulse, he would not let me listen to his chest. VASCULAR: 2+ irregular radial pulse on right. PULMONARY/CHEST WALL: no deformities, normal breath sounds bilaterally, normal work of breathing ABDOMINAL:  soft, non-tender, non-distended EXTREMITIES: no edema, no muscle atrophy, warm and well-perfused SKIN: Dry and intact without apparent rashes or wounds. No peripheral cyanosis. NEUROLOGIC: alert, no abnormal movements, cranial nerves grossly intact. PSYCH: tangential speech with some delusions.  Aggressive, combative demeanor.   Labs    Recent Labs    12/06/20 1719  TROPONINIHS 3   Lab Results  Component Value Date   WBC 6.6 12/06/2020   HGB 15.0 12/06/2020   HCT 45.1 12/06/2020   MCV 89.8 12/06/2020   PLT 155 12/06/2020    Recent Labs  Lab 12/06/20 1705  NA 139  K 3.3*  CL 109  CO2 22  BUN 12  CREATININE 1.06  CALCIUM 8.1*  GLUCOSE 150*   Lab Results  Component Value Date   CHOL 141 05/08/2018   HDL 36 (L) 05/08/2018   LDLCALC 87 05/08/2018   TRIG 91 05/08/2018   No results found for: DDIMER No results for input(s): BNP in the last 8760 hours. No results for input(s): PROBNP in the last 8760 hours.    Radiology Studies    DG Chest Port 1 View  Result Date: 12/06/2020 CLINICAL DATA:  Atrial fibrillation. EXAM: PORTABLE CHEST 1 VIEW COMPARISON:  08/08/2010 FINDINGS: Low lung volumes. Borderline cardiomegaly likely accentuated by technique. No pulmonary edema, focal airspace disease, large pleural effusion or pneumothorax. No acute osseous abnormalities are seen. IMPRESSION: Low lung volumes with borderline cardiomegaly. Electronically Signed   By: Narda Rutherford M.D.   On: 12/06/2020 17:31    ECG & Cardiac Imaging    Initial ECG AF w/ RVR otherwise normal ECT Repeat ECG with rate controlled AF, 70 BPM, otherwise normal ECG.  Assessment & Plan    60 year old male with history of schizoaffective disorder and major depressive disorder brought to the ED with syncopal episode and new onset atrial fibrillation with rapid ventricular response.  Suspect that he will tolerate rate control poorly given hemodynamics in the ED with hypotension in response to diltiazem  drip.  This could represent depressed LV function but on exam he is not in clinical heart failure.  I cannot get a reliable history from him that would ascertain duration of the new onset AF and favor TEE guided cardioversion.  For now recommend permissive rate control with goal rate <120 as able, blood pressure permitting.  Stop IV diltiazem and can start low dose metoprolol as above.  His C2V2 score is only one for male sex from what I can ascertain at this point, but favor anticoagulant in the event that control is pursued at least in the post cardioversion period.  I do think he's somewhat psychotic and not consentable in his present state.  If his mood symptoms can be stabilized and we are confident that he will be adherent to anticoagulants then we can consider TEE/DCCV.  Please make NPO @  MN in the event we can perform this tomorrow.  TSH okay.  Will review echocardiogram.  Urine drug screen is pending.  Also recommend excluding other precipitants of acute AF and syncopal episodes/hypotension including sepsis, acute thromboembolism.  Recommendations #New onset with atrial fibrillation, rapid ventricular response, C2V2 = 1 #Hypotension - Stop diltiazem infusion - If, after diltiazem washes out for 6 hours, heart rate persistently >120 BPM can start metoprolol tartrate 12.5 mg q6h. - Agree with judicious fluid resuscitation - TTE in AM - Continue heparin infusion.  If affordable for him, favor DOAC.  If he has truly lone AF and rhythm control is pursued then there is no hard indication for anticoagulation. - NPO @ MN in the event of TEE/DCCV but mood symptoms will have to be stabilized to permit consent for a nonemergent procedure.  Signed, Regino Schultze, MD 12/06/2020, 8:21 PM  For questions or updates, please contact   Please consult [email or web address] for contact info under Cardiology/STEMI.

## 2020-12-06 NOTE — H&P (Signed)
Behavioral Health Medical Screening Exam  Anthony Knox is a 60 y.o. male.patient presented to [facility] as a walk in alone with complaints of "I am sick".  Helene Kelp, 60 y.o., male patient seen face to face by this provider, consulted with Dr. Lucianne Muss; and chart reviewed on 12/06/20.     Patient initially presented and an assessment was attempted in the assessment room. Patient states he is here because "I am sick". Patient was irritable with questions and got up and stated he was leaving. Patient exited the building. Patient returned in a matter of minutes with his brother Anthony Knox. Patient agreed to have brother present during the assessment.   Pateint was brought into the conference room. During the second time trying to perform an evaluation Anthony Knox is initially in no acute distress.  He was alert, oriented, calm, cooperative and attentive. Not long into the assessment patient stated, "I don't feel well". He put his hand on his head, his head fell forward he took a deep breath and turned grey. 911 was called.Patient was assessed. He was in respiratory distress.  Marland Kitchen He was clammy, diaphoretic, RR rate was 6 then to 8 and then 15 perminute.  He lost consciousness and did not regain consciousness until EMS was transporting out of room. Vital signs were checked and were unstable. Patient became incontinent and urinated in chair. After several minutes, patients color returned.   Patients brother reports patient was released from prison on 5/23. States he lives alone. Reports patient does not take any medications. States patient has no history of seizures, diabetes, lung, heart or kidney problems. States patient does not drink alcohol nor does he use any illegal substances.   Total Time spent with patient: 30 minutes  Psychiatric Specialty Exam:  Presentation  General Appearance: Bizarre  Eye Contact:Good  Speech:Clear and Coherent; Normal Rate  Speech  Volume:Normal  Handedness:Right   Mood and Affect  Mood:Depressed; Irritable  Affect:Congruent   Thought Process  Thought Processes:Coherent  Descriptions of Associations:Intact  Orientation:Full (Time, Place and Person)  Thought Content:Illogical  History of Schizophrenia/Schizoaffective disorder:No data recorded Duration of Psychotic Symptoms:No data recorded Hallucinations:Hallucinations: -- (did not get that far into the assessment, before patients mental status changed. called 911)  Ideas of Reference:No data recorded Suicidal Thoughts:No data recorded Homicidal Thoughts:No data recorded  Sensorium  Memory:No data recorded Judgment:No data recorded Insight:No data recorded  Executive Functions  Concentration:No data recorded Attention Span:No data recorded Recall:No data recorded Fund of Knowledge:No data recorded Language:No data recorded  Psychomotor Activity  Psychomotor Activity:No data recorded  Assets  Assets:No data recorded  Sleep  Sleep:No data recorded   Physical Exam: Physical Exam HENT:     Head: Normocephalic.     Right Ear: External ear normal.     Left Ear: External ear normal.     Mouth/Throat:     Pharynx: Oropharynx is clear.  Eyes:     Conjunctiva/sclera: Conjunctivae normal.  Cardiovascular:     Rate and Rhythm: Bradycardia present.  Pulmonary:     Effort: Respiratory distress present.  Musculoskeletal:     Cervical back: Normal range of motion.  Skin:    Capillary Refill: Capillary refill takes more than 3 seconds.     Coloration: Skin is pale (clammy and pale).  Neurological:     Mental Status: He is alert and oriented to person, place, and time.     Comments: Initially alert, but patient had sudden change in mental status.  Psychiatric:        Attention and Perception: Attention normal.        Mood and Affect: Mood is depressed. Affect is angry.        Speech: Speech normal.        Behavior: Behavior is  uncooperative and agitated.        Thought Content: Thought content includes suicidal ideation.        Cognition and Memory: Cognition normal.        Judgment: Judgment is impulsive.    Review of Systems  Unable to perform ROS: Mental status change   Blood pressure (!) 68/42, pulse (!) 45, temperature 97.9 F (36.6 C), temperature source Oral, resp. rate (!) 6, SpO2 97 %. There is no height or weight on file to calculate BMI.  Musculoskeletal: Strength & Muscle Tone: flaccid Gait & Station: unable to stand Patient leans: Front   Recommendations:  Based on my evaluation the patient appears to have an emergency medical condition for which I recommend the patient be transferred to the emergency department for further evaluation.   911 was called, EMS transported patient to [facility] ED, report was called to Dr. Rhunette Croft.   Ardis Hughs, NP 12/06/2020, 4:54 PM

## 2020-12-06 NOTE — ED Notes (Signed)
Attempted report at this time. Awaiting unit's charge nurse approval

## 2020-12-06 NOTE — ED Notes (Signed)
Patient's BP at this time low, cuff was readjusted and BP taken again, still low. Diltiazem drip stopped. Hospitalist paged at this time.

## 2020-12-06 NOTE — Progress Notes (Signed)
ANTICOAGULATION CONSULT NOTE - Initial Consult  Pharmacy Consult for heparin Indication: atrial fibrillation  No Known Allergies  Patient Measurements: Height: 5\' 7"  (170.2 cm) Weight: 86.2 kg (190 lb) IBW/kg (Calculated) : 66.1 Heparin Dosing Weight: 83 KG   Vital Signs: Temp: 97.8 F (36.6 C) (05/26 1702) Temp Source: Oral (05/26 1702) BP: 106/81 (05/26 1915) Pulse Rate: 98 (05/26 1915)  Labs: Recent Labs    12/06/20 1705 12/06/20 1719  HGB 15.0  --   HCT 45.1  --   PLT 155  --   LABPROT 13.6  --   INR 1.0  --   CREATININE 1.06  --   TROPONINIHS  --  3    Estimated Creatinine Clearance: 80.6 mL/min (by C-G formula based on SCr of 1.06 mg/dL).   Medical History: Past Medical History:  Diagnosis Date  . Schizophrenia (HCC)     Medications:   Assessment: 60 yo patient presenting from behavioral health in respiratory distress and loss of consciousness. Per brother, patient was released from prison on 5/23 and takes no medications, though this is difficult to confirm. EMS noted patient was in afib during transport to ED. Patient is currently on diltiazem gtt. CBC and Scr wnl.  Goal of Therapy:  Heparin level 0.3-0.7 units/ml Monitor platelets by anticoagulation protocol: Yes   Plan:  Heparin 4000 units x1 followed by heparin 1100 units/hr  8-hour HL  Daily CBC and HL  Monitor for s/sx of bleed  6/23, PharmD, [facility] Pharmacy Resident [phone] 12/06/2020 7:57 PM

## 2020-12-06 NOTE — Consult Note (Signed)
NAME:  Anthony Knox, MRN:  [id number], DOB:  [birth year], LOS: 0 ADMISSION DATE:  12/06/2020, CONSULTATION DATE: 12/06/2020 REFERRING MD: Dr. Dareen Piano, CHIEF COMPLAINT: Atrial fibrillation  History of Present Illness:  This is a 60 year old male with history of schizoaffective disorder who presented with new onset A. fib with RVR.  Patient had been in the ED receiving a diltiazem drip.  In the ED patient became bradycardic into the 30s and 40s with hypotension.  Blood pressure is noted to be as low as 70s/50s.  Was contacted for consideration of transfer to the ICU.  In the ED work-up was remarkable for negative COVID test, negative troponins, negative salicylates, negative Tylenol level, UDS that was negative, TSH that was 9.44, CBC that was reassuring, magnesium of 1.9, and basic metabolic panel that was reassuring as well.  VBG and with no respiratory acidosis.      Pertinent  Medical History  Schizoaffective disorder/major depressive disorder/bipolar disorder  Significant Hospital Events: Including procedures, antibiotic start and stop dates in addition to other pertinent events   . Patient admitted to the floor  Interim History / Subjective:  Not applicable  Objective   Blood pressure 107/80, pulse 84, temperature 97.8 F (36.6 C), temperature source Oral, resp. rate 17, height 5\' 7"  (1.702 m), weight 86.2 kg, SpO2 96 %.        Intake/Output Summary (Last 24 hours) at 12/06/2020 2327 Last data filed at 12/06/2020 2321 Gross per 24 hour  Intake 2012.67 ml  Output --  Net 2012.67 ml   Filed Weights   12/06/20 1703  Weight: 86.2 kg    Examination: General: Patient alert oriented x1.  Able to follow simple commands. HENT: Moist mucous membranes Lungs: Clear to auscultation with no crackles appreciated Cardiovascular: Regular rate irregular rhythm Abdomen: Soft nontender nondistended Extremities: Warm well perfused.  No pitting edema appreciated Neuro: Arousable and  able to carry on conversation.  Does appear somewhat confused but conversive GU: Deferred  Labs/imaging that I havepersonally reviewed  (right click and "Reselect all SmartList Selections" daily)  Chest x-ray from 12/06/2020 noted as below   FINDINGS: Low lung volumes. Borderline cardiomegaly likely accentuated by technique. No pulmonary edema, focal airspace disease, large pleural effusion or pneumothorax. No acute osseous abnormalities are seen.  IMPRESSION: Low lung volumes with borderline cardiomegaly.  Resolved Hospital Problem list   Not applicable  Assessment & Plan:  This is a 60 year old male with history as noted above who presents after being referred from behavioral health who noted patient to be in A. fib with RVR.   Atrial fibrillation with rapid ventricular rate-on my exam patient is warm and well-perfused.  Is confused but arousable.  Maps persistently in mid 70s and low 80s.  Heart rate in the 70s and 80s as well. -If patient should develop A. fib with RVR again would consider amiodarone rather than diltiazem as patient did become acutely hypotensive and bradycardic.  Aware that this could chemically cardiovert patient in risk of embolic phenomenon however the real and present risk of hypotension would supersede this. -Once patient cleared from a swallowing perspective would have low threshold to initiate oral medications -Agree with anticoagulation -Obtain lactic acid if normal this is further support of good perfusion good candidate for the floor -Agree with echocardiogram       Labs   CBC: Recent Labs  Lab 12/06/20 1705 12/06/20 2321  WBC 6.6  --   HGB 15.0 14.6  HCT 45.1 43.0  MCV 89.8  --  PLT 155  --     Basic Metabolic Panel: Recent Labs  Lab 12/06/20 1705 12/06/20 2321  NA 139 146*  K 3.3* 4.1  CL 109  --   CO2 22  --   GLUCOSE 150*  --   BUN 12  --   CREATININE 1.06  --   CALCIUM 8.1*  --   MG 1.9  --    GFR: Estimated  Creatinine Clearance: 80.6 mL/min (by C-G formula based on SCr of 1.06 mg/dL). Recent Labs  Lab 12/06/20 1705  WBC 6.6    Liver Function Tests: No results for input(s): AST, ALT, ALKPHOS, BILITOT, PROT, ALBUMIN in the last 168 hours. No results for input(s): LIPASE, AMYLASE in the last 168 hours. No results for input(s): AMMONIA in the last 168 hours.  ABG    Component Value Date/Time   HCO3 20.8 12/06/2020 2321   TCO2 22 12/06/2020 2321   ACIDBASEDEF 4.0 (H) 12/06/2020 2321   O2SAT 86.0 12/06/2020 2321     Coagulation Profile: Recent Labs  Lab 12/06/20 1705  INR 1.0    Cardiac Enzymes: No results for input(s): CKTOTAL, CKMB, CKMBINDEX, TROPONINI in the last 168 hours.  HbA1C: Hgb A1c MFr Bld  Date/Time Value Ref Range Status  05/08/2018 06:13 AM 5.9 (H) 4.8 - 5.6 % Final    Comment:    (NOTE) Pre diabetes:          5.7%-6.4% Diabetes:              >6.4% Glycemic control for   <7.0% adults with diabetes     CBG: No results for input(s): GLUCAP in the last 168 hours.  Review of Systems:   Pertinent positives and negatives per HPI  Past Medical History:  He,  has a past medical history of Schizophrenia (HCC).   Surgical History:  No past surgical history on file.   Social History:   reports previous alcohol use. He reports that he does not use drugs.   Family History:  His family history is not on file.   Allergies No Known Allergies   Home Medications  Prior to Admission medications   Medication Sig Start Date End Date Taking? Authorizing Provider  ARIPiprazole ER (ABILIFY MAINTENA) 400 MG SRER injection Inject 2 mLs (400 mg total) into the muscle every 28 (twenty-eight) days. 07/21/18   Money, Gerlene Burdock, FNP  benztropine (COGENTIN) 0.5 MG tablet Take 1 tablet (0.5 mg total) by mouth 2 (two) times daily. For EPS 06/24/18   Money, Gerlene Burdock, FNP  carbamazepine (TEGRETOL) 200 MG tablet Take 1 tablet (200 mg total) by mouth 3 (three) times daily. For mood  control 06/24/18   Money, Gerlene Burdock, FNP  diphenhydrAMINE (BENADRYL) 25 mg capsule Take 1 capsule (25 mg total) by mouth 2 (two) times daily. anxiety 06/24/18   Money, Gerlene Burdock, FNP  hydrOXYzine (ATARAX/VISTARIL) 50 MG tablet Take 1 tablet (50 mg total) by mouth every 4 (four) hours as needed for anxiety (Sleep). 06/24/18   Money, Gerlene Burdock, FNP  risperiDONE (RISPERDAL) 3 MG tablet Take 1 tablet (3 mg total) by mouth 2 (two) times daily. For mood control 06/24/18   Money, Feliz Beam B, FNP  temazepam (RESTORIL) 30 MG capsule Take 1 capsule (30 mg total) by mouth at bedtime. 06/24/18   Money, Gerlene Burdock, FNP     Critical care time: 50 minutes

## 2020-12-06 NOTE — ED Notes (Signed)
Patient seen to be wet, attempted to assist with changing linen and providing pericare. Patient refused, stated that he did not want this RN in room anymore.

## 2020-12-06 NOTE — BH Assessment (Addendum)
Anthony Knox presented to [facility] as a walk-in for a TTS assessment. Clinician met with patient along with provider Thomes Lolling, NP). Attempted to assess patient. Patient stating he was brought to [facility] by a Ly[employer] because he was sick.  Clinician continued to ask additional questions to gain insight and clarity on his response, "I'm sick". He was uncooperative and guarded with answering questions. Observantly aggravated/frustrated. However, reported that he has felt sick for "a couple of days... all over". He refused to provide additional information stating, "Your the doctor or nurse so you figure it out".   Clinician attempted to gain further insight about of his mental health concerns. He indicated that he has a  Dx's of depression and has been depressed "all my life". Patient decided that he did not want to participate in the assessment. Stated, "Are you some kind of lawyer because you are badgering me".  He abruptly got up and left the room stating, "I'm leaving". He refused his TTS assessment. Patient was walked out to the lobby area by this Clinician and provider.   Received a call from from desk staff stating that patient changed his mind and wanted to be seen. Clinician and provider went back to get patient from the lobby area and took him to a room to continue to his assessment. He consented for his brother to sit in on the assessment.   Patient's brother, Anthony Knox #[phone] states that patient was released from prison Monday 5/23 after serving a 22 month sentence. Says that Anthony Knox was diagnosed with Schizophrenia 25 yrs ago.  The brother doesn't know if patient's mental health needs were treated during his time in prison. Patient lives alone. The brother goal is:  "I want [place] to go somewhere so they can make him take his medications". No SI, HI, and/or AVH's reported by brother.   Clinician attempted to continue the TTS assessment and observed patient suddenly slump over, gray tint to  skin, and unable to respond to questions when asked, discontinued making eye contact, and patient's breathing became shallow. Provider present and intervened.   Clinician immediately notified [facility] ([place], RN) of the change and called 911 for EMS assistance. Patient transported to [facility] via EMS. [facility] provider notified [place] staff that patient was in route.   Disposition: Per Thomes Lolling, NP, patient to be medically cleared, TTS will need to still complete patient's assessment in the event that he medically clears, and disposition will be decided by a [facility] provider once the TTS assessment is completed.

## 2020-12-07 ENCOUNTER — Inpatient Hospital Stay (HOSPITAL_COMMUNITY): Payer: PPO

## 2020-12-07 DIAGNOSIS — I4891 Unspecified atrial fibrillation: Secondary | ICD-10-CM

## 2020-12-07 DIAGNOSIS — R55 Syncope and collapse: Secondary | ICD-10-CM

## 2020-12-07 DIAGNOSIS — Z9189 Other specified personal risk factors, not elsewhere classified: Secondary | ICD-10-CM

## 2020-12-07 DIAGNOSIS — F29 Unspecified psychosis not due to a substance or known physiological condition: Secondary | ICD-10-CM | POA: Diagnosis not present

## 2020-12-07 DIAGNOSIS — E038 Other specified hypothyroidism: Secondary | ICD-10-CM

## 2020-12-07 LAB — CBC
HCT: 45.3 % (ref 39.0–52.0)
Hemoglobin: 15.1 g/dL (ref 13.0–17.0)
MCH: 29.8 pg (ref 26.0–34.0)
MCHC: 33.3 g/dL (ref 30.0–36.0)
MCV: 89.3 fL (ref 80.0–100.0)
Platelets: 150 10*3/uL (ref 150–400)
RBC: 5.07 MIL/uL (ref 4.22–5.81)
RDW: 14.3 % (ref 11.5–15.5)
WBC: 8.1 10*3/uL (ref 4.0–10.5)
nRBC: 0 % (ref 0.0–0.2)

## 2020-12-07 LAB — LIPID PANEL
Cholesterol: 170 mg/dL (ref 0–200)
HDL: 32 mg/dL — ABNORMAL LOW (ref >40)
LDL Cholesterol: 127 mg/dL — ABNORMAL HIGH (ref 0–99)
Total CHOL/HDL Ratio: 5.3 RATIO
Triglycerides: 56 mg/dL (ref <150)
VLDL: 11 mg/dL (ref 0–40)

## 2020-12-07 LAB — COMPREHENSIVE METABOLIC PANEL
ALT: 12 U/L (ref 0–44)
AST: 13 U/L — ABNORMAL LOW (ref 15–41)
Albumin: 3.3 g/dL — ABNORMAL LOW (ref 3.5–5.0)
Alkaline Phosphatase: 44 U/L (ref 38–126)
Anion gap: 8 (ref 5–15)
BUN: 8 mg/dL (ref 6–20)
CO2: 21 mmol/L — ABNORMAL LOW (ref 22–32)
Calcium: 8.5 mg/dL — ABNORMAL LOW (ref 8.9–10.3)
Chloride: 114 mmol/L — ABNORMAL HIGH (ref 98–111)
Creatinine, Ser: 0.87 mg/dL (ref 0.61–1.24)
GFR, Estimated: >60 mL/min (ref >60)
Glucose, Bld: 109 mg/dL — ABNORMAL HIGH (ref 70–99)
Potassium: 3.9 mmol/L (ref 3.5–5.1)
Sodium: 143 mmol/L (ref 135–145)
Total Bilirubin: 0.6 mg/dL (ref 0.3–1.2)
Total Protein: 5.7 g/dL — ABNORMAL LOW (ref 6.5–8.1)

## 2020-12-07 LAB — VALPROIC ACID LEVEL: Valproic Acid Lvl: <10 ug/mL — ABNORMAL LOW (ref 50.0–100.0)

## 2020-12-07 LAB — LACTIC ACID, PLASMA: Lactic Acid, Venous: 1.1 mmol/L (ref 0.5–1.9)

## 2020-12-07 LAB — HIV ANTIBODY (ROUTINE TESTING W REFLEX): HIV Screen 4th Generation wRfx: NONREACTIVE

## 2020-12-07 LAB — HEPARIN LEVEL (UNFRACTIONATED)
Heparin Unfractionated: 0.45 IU/mL (ref 0.30–0.70)
Heparin Unfractionated: 0.33 IU/mL (ref 0.30–0.70)

## 2020-12-07 LAB — MRSA PCR SCREENING: MRSA by PCR: NEGATIVE

## 2020-12-07 LAB — HEMOGLOBIN A1C
Hgb A1c MFr Bld: 5.9 % — ABNORMAL HIGH (ref 4.8–5.6)
Mean Plasma Glucose: 123 mg/dL

## 2020-12-07 LAB — T4, FREE: Free T4: 0.99 ng/dL (ref 0.61–1.12)

## 2020-12-07 MED ORDER — HALOPERIDOL LACTATE 5 MG/ML IJ SOLN
10.0000 mg | Freq: Once | INTRAMUSCULAR | Status: AC
  Administered 2020-12-07: 10 mg via INTRAVENOUS

## 2020-12-07 MED ORDER — HALOPERIDOL LACTATE 5 MG/ML IJ SOLN: 10.0000 mg | Freq: Once | INTRAMUSCULAR | Status: AC

## 2020-12-07 MED ORDER — DIVALPROEX SODIUM 500 MG PO DR TAB
500.0000 mg | DELAYED_RELEASE_TABLET | Freq: Three times a day (TID) | ORAL | Status: DC
  Administered 2020-12-07 – 2020-12-12 (×15): 500 mg via ORAL
  Filled 2020-12-07 (×15): qty 1

## 2020-12-07 MED ORDER — HALOPERIDOL LACTATE 5 MG/ML IJ SOLN: 10.0000 mg | Freq: Once | INTRAMUSCULAR | Status: DC

## 2020-12-07 MED ORDER — HALOPERIDOL LACTATE 5 MG/ML IJ SOLN
5.0000 mg | Freq: Once | INTRAMUSCULAR | Status: DC
  Administered 2020-12-07: 5 mg via INTRAVENOUS
  Filled 2020-12-07: qty 1

## 2020-12-07 MED ORDER — POTASSIUM CHLORIDE 10 MEQ/100ML IV SOLN
10.0000 meq | INTRAVENOUS | Status: AC
  Administered 2020-12-07 (×2): 10 meq via INTRAVENOUS
  Filled 2020-12-07 (×2): qty 100

## 2020-12-07 MED ORDER — HALOPERIDOL 5 MG PO TABS
10.0000 mg | ORAL_TABLET | Freq: Once | ORAL | Status: DC
  Filled 2020-12-07: qty 2

## 2020-12-07 MED ORDER — RISPERIDONE 1 MG PO TBDP: 1.0000 mg | ORAL_TABLET | Freq: Two times a day (BID) | ORAL | Status: DC

## 2020-12-07 MED ORDER — RISPERIDONE 1 MG PO TBDP
1.0000 mg | ORAL_TABLET | Freq: Two times a day (BID) | ORAL | Status: DC
  Administered 2020-12-07 – 2020-12-09 (×5): 1 mg via ORAL
  Filled 2020-12-07 (×6): qty 1

## 2020-12-07 MED ORDER — BENZTROPINE MESYLATE 0.5 MG PO TABS
0.5000 mg | ORAL_TABLET | Freq: Two times a day (BID) | ORAL | Status: DC
  Administered 2020-12-07 – 2020-12-13 (×13): 0.5 mg via ORAL
  Filled 2020-12-07 (×15): qty 1

## 2020-12-07 MED ORDER — HALOPERIDOL 5 MG PO TABS
10.0000 mg | ORAL_TABLET | Freq: Once | ORAL | Status: AC
  Administered 2020-12-07: 10 mg via ORAL
  Filled 2020-12-07: qty 2

## 2020-12-07 MED ORDER — MAGNESIUM SULFATE IN D5W 1-5 GM/100ML-% IV SOLN
1.0000 g | Freq: Once | INTRAVENOUS | Status: AC
  Administered 2020-12-07: 1 g via INTRAVENOUS
  Filled 2020-12-07: qty 100

## 2020-12-07 NOTE — Progress Notes (Signed)
Patient IV beeping occluded, writer into room to restart and was told by the patient to get the F#$% out.  Upon leaving the room the sitter attempted to get VS and patient began screaming at the top of his lungs "Get the F%^&* out, I'm leaving" numerous times, sat up abruptly and removed both his IVs and the telemetry monitor. Fearing escalation to physical violence, I  instructed sitter to leave the room but keep patient in view.  Notified charge nurse, security of potential for violence and their presence requested and paged Dr. Jerald Kief to notify her of the escalation.

## 2020-12-07 NOTE — Progress Notes (Signed)
Progress Note  Patient Name: Anthony Knox Date of Encounter: 12/07/2020  [facility] Cardiologist: None New  Subjective   Events since admission reviewed. He remains in rate controlled afib on no nodal agents. He denies any chest pain or shortness of breath. We discussed his rhythm and options, see below.  Inpatient Medications    Scheduled Meds:  Continuous Infusions: . sodium chloride 125 mL/hr at 12/07/20 0345  . magnesium sulfate bolus IVPB    . potassium chloride     PRN Meds: acetaminophen **OR** acetaminophen, hydrOXYzine   Vital Signs    Vitals:   12/07/20 0025 12/07/20 0050 12/07/20 0314 12/07/20 0746  BP:  (!) 128/94 97/70 (!) 113/98  Pulse:  93 86 84  Resp:  15 15   Temp: 97.7 F (36.5 C) 98.1 F (36.7 C) 97.8 F (36.6 C)   TempSrc: Oral Oral Oral   SpO2:  95% 97%   Weight:  79.5 kg    Height:  5\' 7"  (1.702 m)      Intake/Output Summary (Last 24 hours) at 12/07/2020 1010 Last data filed at 12/07/2020 0622 Gross per 24 hour  Intake 2608.47 ml  Output 1500 ml  Net 1108.47 ml   Last 3 Weights 12/07/2020 12/06/2020 05/05/2018  Weight (lbs) 175 lb 4.3 oz 190 lb 180 lb  Weight (kg) 79.5 kg 86.183 kg 81.647 kg  Some encounter information is confidential and restricted. Go to Review Flowsheets activity to see all data.      Telemetry    Atrial fibrillation, rates 80s-90s - Personally Reviewed  ECG    Initially afib RVR, then afib with controlled ventricular rate - Personally Reviewed  Physical Exam   GEN: No acute distress.   Neck: No JVD Cardiac: irregularly irregular, no murmurs, rubs, or gallops.  Respiratory: Clear to auscultation bilaterally. GI: Soft, nontender, non-distended  MS: No edema; No deformity. Neuro:  Nonfocal  Psych: Normal affect   Labs    High Sensitivity Troponin:   Recent Labs  Lab 12/06/20 1719 12/06/20 1942  TROPONINIHS 3 3      Chemistry Recent Labs  Lab 12/06/20 1705 12/06/20 2321 12/07/20 0100  NA  139 146* 143  K 3.3* 4.1 3.9  CL 109  --  114*  CO2 22  --  21*  GLUCOSE 150*  --  109*  BUN 12  --  8  CREATININE 1.06  --  0.87  CALCIUM 8.1*  --  8.5*  PROT  --   --  5.7*  ALBUMIN  --   --  3.3*  AST  --   --  13*  ALT  --   --  12  ALKPHOS  --   --  44  BILITOT  --   --  0.6  GFRNONAA >60  --  >60  ANIONGAP 8  --  8     Hematology Recent Labs  Lab 12/06/20 1705 12/06/20 2321 12/07/20 0100  WBC 6.6  --  8.1  RBC 5.02  --  5.07  HGB 15.0 14.6 15.1  HCT 45.1 43.0 45.3  MCV 89.8  --  89.3  MCH 29.9  --  29.8  MCHC 33.3  --  33.3  RDW 14.1  --  14.3  PLT 155  --  150    BNPNo results for input(s): BNP, PROBNP in the last 168 hours.   DDimer No results for input(s): DDIMER in the last 168 hours.   Radiology    DG  Chest Port 1 View  Result Date: 12/06/2020 CLINICAL DATA:  Atrial fibrillation. EXAM: PORTABLE CHEST 1 VIEW COMPARISON:  08/08/2010 FINDINGS: Low lung volumes. Borderline cardiomegaly likely accentuated by technique. No pulmonary edema, focal airspace disease, large pleural effusion or pneumothorax. No acute osseous abnormalities are seen. IMPRESSION: Low lung volumes with borderline cardiomegaly. Electronically Signed   By: Narda Rutherford M.D.   On: 12/06/2020 17:31    Cardiac Studies   Echo pending  Patient Profile     60 y.o. male without known prior cardiac history, PMH of psychosis (admission 06/2018), major depression with severe recurrence in 2019, schozoaffective disorder presenting after syncope, found to have atrial fibrillation with RVR.  Assessment & Plan    New onset atrial fibrillation, presenting with RVR: complicated by hypotension and bradycardia when placed on diltiazem drip -CHA2DS2/VAS Stroke Risk Points= 0     Points Metrics  0 Has Congestive Heart Failure:  No   0 Has Vascular Disease:  No   0 Has Hypertension:  No   0 Age:  60   0 Has Diabetes:  No   0 Had Stroke:  No  Had TIA:  No  Had Thromboembolism:  No   0 Male:   No     With a chadsvasc of 0 and currently rate controlled and asymptomatic, I think TEE-CV poses more risk than benefit. While I would prefer him to be in normal sinus rhythm, a cardioversion (with or without TEE) requires at least 4 weeks of uninterrupted anticoagulation after the procedure. While he doesn't currently require long term anticoagulation, even this short time period may be problematic. He would need to be somewhere that he has his medications administered to him, as he is unable to tell me anything about his medications currently. Without this option, I fear that cardioversion may place him at unnecessary risk, both for stroke if under-treated with anticoagulation or severe bleeding if anticoagulation taken more than prescribed. I think here the risk outweighs the benefits.  Without cardioversion, given current risk factors, he does not require long term anticoagulation at this time. If his rates increase, would trial very low dose beta blocker such as metoprolol succinate 12.5 mg daily over a calcium channel blocker.  If he is able to be somewhere where his medication is administered, we can readress. However, the procedure could not be done until 5/31 at the earliest, and it may be done as an outpatient if needed.  Awaiting echocardiogram results. If it is unremarkable, no further treatment recommended. If there are abnormalities, we will need to discuss next steps.   Appreciate primary team further evaluating thyroid function.  Syncope: noted yesterday, witnessed. No vitals prior to event. Noted to have incontinence with loss of consciousness. Unclear trigger, atrial fibrillation/arrhythmia may be cause or effect, no way to discern at this time. Of note, physical exam describes bradycardia and HR after event charted as 45 bpm, with BP 68/42--given that he presented to ER in afib RVR, this points more towards something other than afib as a cause, but cannot be certain. Ruling out  structural heart issues with echo.  For questions or updates, please contact [facility] Please consult [email or web address] for contact info under        Signed, Jodelle Red, MD  12/07/2020, 10:10 AM

## 2020-12-07 NOTE — Progress Notes (Signed)
   12/07/20 1640  Assess: MEWS Score  ECG Heart Rate (!) 151  Resp (!) 24  Level of Consciousness Alert  O2 Device Room Air  Assess: MEWS Score  MEWS Temp 0  MEWS Systolic 0  MEWS Pulse 3  MEWS RR 1  MEWS LOC 0  MEWS Score 4  MEWS Score Color Red  Assess: if the MEWS score is Yellow or Red  Were vital signs taken at a resting state? Yes  Focused Assessment Change from prior assessment (see assessment flowsheet)  Early Detection of Sepsis Score *See Row Information* Low  MEWS guidelines implemented *See Row Information* No, previously red, continue vital signs every 4 hours  Escalate  MEWS: Escalate Red: discuss with charge nurse/RN and provider, consider discussing with RRT  Notify: Charge Nurse/RN  Name of Charge Nurse/RN Notified Wilkie Aye  Notify: Provider  Provider Name/Title Maness  Date Provider Notified 12/07/20  Time Provider Notified 1640  Notification Type Page  Notification Reason Change in status  Provider response En route;See new orders  Date of Provider Response 12/07/20  Time of Provider Response 1642  Document  Patient Outcome Stabilized after interventions  Progress note created (see row info) Yes

## 2020-12-07 NOTE — Progress Notes (Signed)
ANTICOAGULATION CONSULT NOTE  Pharmacy Consult for heparin Indication: atrial fibrillation  No Known Allergies  Patient Measurements: Height: 5\' 7"  (170.2 cm) Weight: 79.5 kg (175 lb 4.3 oz) IBW/kg (Calculated) : 66.1 Heparin Dosing Weight: 83 KG   Vital Signs: Temp: 97.8 F (36.6 C) (05/27 0314) Temp Source: Oral (05/27 0314) BP: 113/98 (05/27 0746) Pulse Rate: 84 (05/27 0746)  Labs: Recent Labs    12/06/20 1705 12/06/20 1719 12/06/20 1942 12/06/20 2321 12/07/20 0100 12/07/20 0730  HGB 15.0  --   --  14.6 15.1  --   HCT 45.1  --   --  43.0 45.3  --   PLT 155  --   --   --  150  --   LABPROT 13.6  --   --   --   --   --   INR 1.0  --   --   --   --   --   HEPARINUNFRC  --   --   --   --  0.45 0.33  CREATININE 1.06  --   --   --  0.87  --   TROPONINIHS  --  3 3  --   --   --     Estimated Creatinine Clearance: 94.7 mL/min (by C-G formula based on SCr of 0.87 mg/dL).  Assessment: 60 yo patient presenting from behavioral health in respiratory distress and loss of consciousness. Per brother, patient was released from prison on 5/23 and takes no medications, though this is difficult to confirm. EMS noted patient was in afib with RVR during transport to ED.   Hgb 15.1, Scr 0.87, PLTs 150. Heparin gtt for afib.  Heparin level therapeutic (0.33) on gtt at 1100 units/hr. No bleeding noted.  Goal of Therapy:  Monitor platelets by anticoagulation protocol: Yes   Plan:  Heparin d/c'd per cardiology  F/u VTE ppx plan  6/23, Student Pharmacist

## 2020-12-07 NOTE — Progress Notes (Addendum)
Family Medicine Teaching Service Daily Progress Note Intern Pager: [phone]  Patient name: Anthony Knox Medical record number: [id number] Date of birth: Jul 15, 1960 Age: 60 y.o. Gender: male  Primary Care Provider: Patient, No Pcp Per (Inactive) Consultants: Cardiology, Psych Code Status: FULL   Pt Overview and Major Events to Date:  5/26: Admitted   Assessment and Plan: Anthony Knox is a 60 y.o. male presenting after a syncopal episode found to have new onset afib with RVR. PMH is significant for Schizoaffective disorder.   New-onset Atrial Fibrillation with RVR  Hypotension Briefly started on cardizem drip, however, patient did not tolerate and became hypotensive (lowest 50's systolic) and bradycardic (HR 61Y) and cardizem was stopped.  Currently, he is more hemodynamically stable. Heart rates appear improved (80's) despite not having controlling agents at the moment.  Has also had improvement of BP's (now 110's systolic, 90's diastolic). Unclear trigger of a-fib at this point. Will need to further evaluate valvular abnormalities with echo and would recommend outpatient sleep study. Low suspicion for VTE as cause, Wells score 0.  UTox negative. K 3.9, Mg 1.9. -Cardiology following, appreciate recommendations  HR goal <120   Start low-dose metoprolol succinate 12.5 daily if HR persistently >120  F/u echo   Not a candidate for TEE/DCCV at this time  Does not need long-term anticoagulation, CHA2DS2/VAS score of 0   -Continue heparin gtt  -Continuous telemetry -Monitor vitals closely -Recommend outpatient sleep study to evaluate for OSA -Daily BMP -Keep K>4, Mg >2  Potassium chloride 10 mEq x2  Mag sulfate 1g IVPB x1    Schizoaffective Disorder  MDD  Psychosis Prior medications from previous [facility] admission include Abilify, Risperdal, Tegretol, Restoril, Vistaril and Cogentin. Received one dose of 20 mg geodon in ED yesterday. Pharmacy team was able to [facility]  who noted that patient was receiving Risperidone 3 mg once daily and Depakote 500 mg TID. Suspect that his AMS is from his diagnosis of schizoaffective disorder as he appears delusional on examination, however, will rule out other acute cause of AMS with head imaging. -Psychiatry consult, appreciate recommendations.  -Appreciate pharmacy's assistance with clarifying patients medications from jail/recent fill history  -Continue 1:1 sitter  -Hydroxyzine 50 mg q4h PRN anxiety, sleep  -Head CT; will give 10 mg Haldol prophylactically for agitation because of his active delusions  Subclinical Hypothyroidism TSH elevated at 9.441, Free T4 normal at 0.99.  -No further workup indicated   FEN/GI: Heart healthy diet PPx: Heparin gtt, per pharmacy   Status is: Inpatient  Remains inpatient appropriate because:Ongoing diagnostic testing needed not appropriate for outpatient work up and Inpatient level of care appropriate due to severity of illness   Dispo: The patient is from: Home              Anticipated d/c is to: Home              Patient currently is not medically stable to d/c.   Difficult to place patient No   Subjective:  Patient states that he feels better this morning.  No longer having chest pain or difficulty breathing.  He states that he is "where the cardinals hang out".  States that the year is 1556 AD.  States that he was brought to the hospital because his lungs collapsed and had a brain infarct.  Objective: Temp:  [97.7 F (36.5 C)-98.1 F (36.7 C)] 97.8 F (36.6 C) (05/27 0314) Pulse Rate:  [32-141] 84 (05/27 0746) Resp:  [  12-22] 15 (05/27 0314) BP: (59-144)/(42-127) 113/98 (05/27 0746) SpO2:  [93 %-98 %] 97 % (05/27 0314) Weight:  [79.5 kg-86.2 kg] 79.5 kg (05/27 0050) Physical Exam: General: Alert but not oriented to place, time, or situation.  Tangential speech, not agitated or aggressive Cardiovascular: Irregularly irregular rate and rhythm, HR 80s Respiratory:  Breathing comfortably, CTAB Abdomen: Normoactive bowel sounds, soft, nondistended, nontender Extremities: Without edema, 2+ DP pulses bilaterally  Laboratory: Recent Labs  Lab 12/06/20 1705 12/06/20 2321 12/07/20 0100  WBC 6.6  --  8.1  HGB 15.0 14.6 15.1  HCT 45.1 43.0 45.3  PLT 155  --  150   Recent Labs  Lab 12/06/20 1705 12/06/20 2321 12/07/20 0100  NA 139 146* 143  K 3.3* 4.1 3.9  CL 109  --  114*  CO2 22  --  21*  BUN 12  --  8  CREATININE 1.06  --  0.87  CALCIUM 8.1*  --  8.5*  PROT  --   --  5.7*  BILITOT  --   --  0.6  ALKPHOS  --   --  44  ALT  --   --  12  AST  --   --  13*  GLUCOSE 150*  --  109*   Imaging/Diagnostic Tests: DG Chest Port 1 View  Result Date: 12/06/2020 CLINICAL DATA:  Atrial fibrillation. EXAM: PORTABLE CHEST 1 VIEW COMPARISON:  08/08/2010 FINDINGS: Low lung volumes. Borderline cardiomegaly likely accentuated by technique. No pulmonary edema, focal airspace disease, large pleural effusion or pneumothorax. No acute osseous abnormalities are seen. IMPRESSION: Low lung volumes with borderline cardiomegaly. Electronically Signed   By: Narda Rutherford M.D.   On: 12/06/2020 17:31     Sabino Dick, DO 12/07/2020, 9:12 AM PGY-1, [place] [facility] FPTS Intern pager: [phone], text pages welcome

## 2020-12-07 NOTE — ED Notes (Signed)
Attempted to call report at this time, charge nurse to review patient

## 2020-12-07 NOTE — Hospital Course (Addendum)
Anthony Knox is a 61 y.o. male presenting after a syncopal episode found to have new onset afib with RVR. PMH is significant for Schizoaffective disorder.   New Onset Atrial Fibrillation with RVR  Hypotension Patient presented with new-onset atrial fibrillation. Patient was started on cardizem and heparin gtt. Unfortunately he became hypotensive (50's systolic at lowest) and bradycardic (to 30's lowest) and drip was immediately stopped. Patient evaluated by ICU at this time and felt that patient was stable for floor as pressures improved with cessation of CCB and with normal lactic acid. Cardiology was consulted and recommended holding rate controlling agents and stated patient was not a candidate for long-term anticoagulation or DCCV (given he wouldn't be able to continue taking anti-coagulation following procedure). Echo revealed EF 55-60% without valvular abnormalities. Unclear cause of etiology of atrial fibrillation but patient converted back to NSR spontaneously.   Schizoaffective Disorder  MDD  Psychosis Patient tangential and delusional during admission. Received Geodon 20 mg x1 in ED. Extensive drug panel collected and negative. Psychiatry was consulted for further management. He was started on Depakote 1000 mg mg BID, risperidone 2mg  BID and cogentin 0.5 mg BID. There was significant improvement after initiation of these medications. Patient was not aggressive. Transferred to inpatient psychiatric facility on 12/13/2020  Subclinical Hypothryoidism TSH elevated at 9.441 with Free T4 0.99. Recommend repeat TSH in 4-6 weeks.     Follow Up Recommendations Recommend outpatient sleep study Recommend abstinence from alcohol Needs follow up TSH in 4-6 weeks. Subclinical hypothyroidism in hospital.

## 2020-12-07 NOTE — Progress Notes (Signed)
   12/07/20 1100  Assess: MEWS Score  Pulse Rate (!) 176  ECG Heart Rate (!) 141  Resp (!) 21  SpO2 91 %  Assess: MEWS Score  MEWS Temp 0  MEWS Systolic 0  MEWS Pulse 3  MEWS RR 1  MEWS LOC 0  MEWS Score 4  MEWS Score Color Red  Assess: if the MEWS score is Yellow or Red  Were vital signs taken at a resting state? No  Focused Assessment Change from prior assessment (see assessment flowsheet)  Early Detection of Sepsis Score *See Row Information* Low  MEWS guidelines implemented *See Row Information* No, previously yellow, continue vital signs every 4 hours  Treat  MEWS Interventions Administered scheduled meds/treatments;Escalated (See documentation below)  Take Vital Signs  Increase Vital Sign Frequency   (no, increases patients agitation)  Escalate  MEWS: Escalate Red: discuss with charge nurse/RN and provider, consider discussing with RRT (patient becoming increasingly agitated by writer's presence in the room with medications attempting to give, writer has been loudly told to leave the room numerous times, attmpting to keep the peace by not going in patient room unless necessary)  Notify: Charge Nurse/RN  Name of Charge Nurse/RN Notified Wilkie Aye  Date Charge Nurse/RN Notified 12/07/20  Time Charge Nurse/RN Notified 1100  Document  Patient Outcome  (calmed)  Progress note created (see row info) Yes

## 2020-12-07 NOTE — Consult Note (Signed)
[facility] Face-to-Face Psychiatry Consult   Reason for Consult:  Psychotic with history of Schizoaffective disorder Referring Physician:  Latrelle Dodrill MD Patient Identification: Anthony Knox MRN:  [id number] Principal Diagnosis: <principal problem not specified> Diagnosis:  Active Problems:   Atrial fibrillation with RVR (HCC)   Syncope   Atrial fibrillation (HCC)   Subclinical hypothyroidism   Total Time spent with patient: 30 minutes  Subjective:   Anthony Knox is a 60 y.o. male patient admitted after a syncopal episode found to have new onset afib with RVR. PMH is significant for Schizoaffective disorder.  HPI:   Patient is sitting in bed with sitter present.   During interview patient is very disorganized only able to stay on topic for a sentence or two before discussing other topics. When asked how he got to the hospital he reports he just passed out and was brought here. He reports that he has been hospitalized at [facility] and been on psychiatric medications but he cant remember because it was 10 years ago (Admitted to [facility] 2019). He then reports that [facility] would have his records because he had eye surgery there and was also born there.  When asked he reports he was released from jail last week. When asked if he was on medications while there he says yes but did not continue taking them because he could not get a follow up appointment to get new prescriptions. He reports that his "brother" but he is sure it really is not his brother but an imposter was supposed to arrange the appointments but it didn't happen.  He reports no SI, HI, or AH. He reports that he sees evil in people. That he knows if people are good or bad by seeing them, if they have scars, or bumps, or calloses. When asked his name he gave a completely wrong name and said he had no middle name, he thought it is September 2020. He is oriented to place. He reports thinking that sometimes people are able to place thoughts  into his head. He does not think that others can hear his thoughts. He thinks that there are hidden images in the tv.   Past Psychiatric History: Schizoaffective Disorder, MDD,   Risk to Self:  No Risk to Others:  No Prior Inpatient Therapy:  Yes [facility] 2019 Prior Outpatient Therapy:    Past Medical History:  Past Medical History:  Diagnosis Date  . Schizophrenia (HCC)    No past surgical history on file. Family History: No family history on file. Family Psychiatric  History: Reports none Social History:  Social History   Substance and Sexual Activity  Alcohol Use Not Currently     Social History   Substance and Sexual Activity  Drug Use Never    Social History   Socioeconomic History  . Marital status: Single    Spouse name: Not on file  . Number of children: Not on file  . Years of education: Not on file  . Highest education level: Not on file  Occupational History  . Not on file  Tobacco Use  . Smoking status: Unknown If Ever Smoked  . Smokeless tobacco: Not on file  Vaping Use  . Vaping Use: Unknown  Substance and Sexual Activity  . Alcohol use: Not Currently  . Drug use: Never  . Sexual activity: Not on file  Other Topics Concern  . Not on file  Social History Narrative  . Not on file   Social Determinants of  Health   Financial Resource Strain: Not on file  Food Insecurity: Not on file  Transportation Needs: Not on file  Physical Activity: Not on file  Stress: Not on file  Social Connections: Not on file   Additional Social History:    Allergies:  No Known Allergies  Labs:  Results for orders placed or performed during the hospital encounter of 12/06/20 (from the past 48 hour(s))  Basic metabolic panel     Status: Abnormal   Collection Time: 12/06/20  5:05 PM  Result Value Ref Range   Sodium 139 135 - 145 mmol/L   Potassium 3.3 (L) 3.5 - 5.1 mmol/L   Chloride 109 98 - 111 mmol/L   CO2 22 22 - 32 mmol/L   Glucose, Bld 150 (H) 70 - 99 mg/dL     Comment: Glucose reference range applies only to samples taken after fasting for at least 8 hours.   BUN 12 6 - 20 mg/dL   Creatinine, Ser 1.61 0.61 - 1.24 mg/dL   Calcium 8.1 (L) 8.9 - 10.3 mg/dL   GFR, Estimated >09 >60 mL/min    Comment: (NOTE) Calculated using the CKD-EPI Creatinine Equation (2021)    Anion gap 8 5 - 15    Comment: Performed at [facility], [address], [place], [place] [address]  Magnesium     Status: None   Collection Time: 12/06/20  5:05 PM  Result Value Ref Range   Magnesium 1.9 1.7 - 2.4 mg/dL    Comment: Performed at [facility], [address] [place], [place] [address]  CBC     Status: None   Collection Time: 12/06/20  5:05 PM  Result Value Ref Range   WBC 6.6 4.0 - 10.5 K/uL   RBC 5.02 4.22 - 5.81 MIL/uL   Hemoglobin 15.0 13.0 - 17.0 g/dL   HCT 47.8 29.5 - 62.1 %   MCV 89.8 80.0 - 100.0 fL   MCH 29.9 26.0 - 34.0 pg   MCHC 33.3 30.0 - 36.0 g/dL   RDW 30.8 65.7 - 84.6 %   Platelets 155 150 - 400 K/uL   nRBC 0.0 0.0 - 0.2 %    Comment: Performed at [facility], [address], [place], [place] [address]  TSH     Status: Abnormal   Collection Time: 12/06/20  5:05 PM  Result Value Ref Range   TSH 9.441 (H) 0.350 - 4.500 uIU/mL    Comment: Performed by a 3rd Generation assay with a functional sensitivity of <=0.01 uIU/mL. Performed at [facility], [address], [place], [place] [address]   Protime-INR (if pt is taking coumadin)     Status: None   Collection Time: 12/06/20  5:05 PM  Result Value Ref Range   Prothrombin Time 13.6 11.4 - 15.2 seconds   INR 1.0 0.8 - 1.2    Comment: (NOTE) INR goal varies based on device and disease states. Performed at [facility], [address], [place], [place] [address]   Urine rapid drug screen (hosp performed)     Status: None   Collection Time: 12/06/20  5:11 PM  Result Value Ref Range   Opiates NONE DETECTED NONE DETECTED   Cocaine NONE DETECTED NONE DETECTED    Benzodiazepines NONE DETECTED NONE DETECTED   Amphetamines NONE DETECTED NONE DETECTED   Tetrahydrocannabinol NONE DETECTED NONE DETECTED   Barbiturates NONE DETECTED NONE DETECTED    Comment: (NOTE) DRUG SCREEN FOR  MEDICAL PURPOSES ONLY.  IF CONFIRMATION IS NEEDED FOR ANY PURPOSE, NOTIFY LAB WITHIN 5 DAYS.  LOWEST DETECTABLE LIMITS FOR URINE DRUG SCREEN Drug Class                     Cutoff (ng/mL) Amphetamine and metabolites    1000 Barbiturate and metabolites    200 Benzodiazepine                 200 Tricyclics and metabolites     300 Opiates and metabolites        300 Cocaine and metabolites        300 THC                            50 Performed at [facility], [address], [place], [place] [address]   Acetaminophen level     Status: Abnormal   Collection Time: 12/06/20  5:11 PM  Result Value Ref Range   Acetaminophen (Tylenol), Serum <10 (L) 10 - 30 ug/mL    Comment: (NOTE) Therapeutic concentrations vary significantly. A range of 10-30 ug/mL  may be an effective concentration for many patients. However, some  are best treated at concentrations outside of this range. Acetaminophen concentrations >150 ug/mL at 4 hours after ingestion  and >50 ug/mL at 12 hours after ingestion are often associated with  toxic reactions.  Performed at [facility], [address] [place], [place] [address]   Salicylate level     Status: Abnormal   Collection Time: 12/06/20  5:11 PM  Result Value Ref Range   Salicylate Lvl <7.0 (L) 7.0 - 30.0 mg/dL    Comment: Performed at [facility], [address], [place], [place] [address]  Carbamazepine level, total     Status: Abnormal   Collection Time: 12/06/20  5:11 PM  Result Value Ref Range   Carbamazepine Lvl <2.0 (L) 4.0 - 12.0 ug/mL    Comment: Performed at [facility], [address], [place], [place] [address]  Troponin I (High Sensitivity)     Status: None   Collection Time: 12/06/20  5:19 PM  Result  Value Ref Range   Troponin I (High Sensitivity) 3 <18 ng/L    Comment: (NOTE) Elevated high sensitivity troponin I (hsTnI) values and significant  changes across serial measurements may suggest ACS but many other  chronic and acute conditions are known to elevate hsTnI results.  Refer to the "Links" section for chest pain algorithms and additional  guidance. Performed at [facility], [address], [place], [place] [address]   Resp Panel by RT-PCR (Flu A&B, Covid) Nasopharyngeal Swab     Status: None   Collection Time: 12/06/20  5:54 PM   Specimen: Nasopharyngeal Swab; Nasopharyngeal(NP) swabs in vial transport medium  Result Value Ref Range   SARS Coronavirus 2 by RT PCR NEGATIVE NEGATIVE    Comment: (NOTE) SARS-CoV-2 target nucleic acids are NOT DETECTED.  The SARS-CoV-2 RNA is generally detectable in upper respiratory specimens during the acute phase of infection. The lowest concentration of SARS-CoV-2 viral copies this assay can detect is 138 copies/mL. A negative result does not preclude SARS-Cov-2 infection and should not be used as the sole basis for treatment or other patient management decisions. A negative result may occur with  improper specimen collection/handling, submission of specimen other than nasopharyngeal swab, presence of viral mutation(s) within the areas targeted  by this assay, and inadequate number of viral copies(<138 copies/mL). A negative result must be combined with clinical observations, patient history, and epidemiological information. The expected result is Negative.  Fact Sheet for Patients:  BloggerCourse.com  Fact Sheet for Healthcare Providers:  SeriousBroker.it  This test is no t yet approved or cleared by the [place] FDA and  has been authorized for detection and/or diagnosis of SARS-CoV-2 by FDA under an Emergency Use Authorization (EUA). This EUA will remain  in effect  (meaning this test can be used) for the duration of the COVID-19 declaration under Section 564(b)(1) of the Act, 21 U.S.C.section 360bbb-3(b)(1), unless the authorization is terminated  or revoked sooner.       Influenza A by PCR NEGATIVE NEGATIVE   Influenza B by PCR NEGATIVE NEGATIVE    Comment: (NOTE) The Xpert Xpress SARS-CoV-2/FLU/RSV plus assay is intended as an aid in the diagnosis of influenza from Nasopharyngeal swab specimens and should not be used as a sole basis for treatment. Nasal washings and aspirates are unacceptable for Xpert Xpress SARS-CoV-2/FLU/RSV testing.  Fact Sheet for Patients: BloggerCourse.com  Fact Sheet for Healthcare Providers: SeriousBroker.it  This test is not yet approved or cleared by the [place] FDA and has been authorized for detection and/or diagnosis of SARS-CoV-2 by FDA under an Emergency Use Authorization (EUA). This EUA will remain in effect (meaning this test can be used) for the duration of the COVID-19 declaration under Section 564(b)(1) of the Act, 21 U.S.C. section 360bbb-3(b)(1), unless the authorization is terminated or revoked.  Performed at [facility], [address] [place], [place] [address]   Troponin I (High Sensitivity)     Status: None   Collection Time: 12/06/20  7:42 PM  Result Value Ref Range   Troponin I (High Sensitivity) 3 <18 ng/L    Comment: (NOTE) Elevated high sensitivity troponin I (hsTnI) values and significant  changes across serial measurements may suggest ACS but many other  chronic and acute conditions are known to elevate hsTnI results.  Refer to the "Links" section for chest pain algorithms and additional  guidance. Performed at [facility], [address], [place], [place] [address]   Urinalysis, Routine w reflex microscopic Urine, Clean Catch     Status: Abnormal   Collection Time: 12/06/20  9:57 PM  Result Value Ref Range    Color, Urine STRAW (A) YELLOW   APPearance CLEAR CLEAR   Specific Gravity, Urine 1.005 1.005 - 1.030   pH 6.0 5.0 - 8.0   Glucose, UA NEGATIVE NEGATIVE mg/dL   Hgb urine dipstick NEGATIVE NEGATIVE   Bilirubin Urine NEGATIVE NEGATIVE   Ketones, ur 5 (A) NEGATIVE mg/dL   Protein, ur NEGATIVE NEGATIVE mg/dL   Nitrite NEGATIVE NEGATIVE   Leukocytes,Ua NEGATIVE NEGATIVE    Comment: Performed at [facility], [address], [place], [place] [address]  HIV Antibody (routine testing w rflx)     Status: None   Collection Time: 12/06/20 11:16 PM  Result Value Ref Range   HIV Screen 4th Generation wRfx Non Reactive Non Reactive    Comment: Performed at [facility], [address] [place], [place] [address]  Lactic acid, plasma     Status: None   Collection Time: 12/06/20 11:16 PM  Result Value Ref Range   Lactic Acid, Venous 1.2 0.5 - 1.9 mmol/L    Comment: Performed at [facility], [address] [place], [place] [address]  I-Stat  venous blood gas, ED     Status: Abnormal   Collection Time: 12/06/20 11:21 PM  Result Value Ref Range   pH, Ven 7.374 7.250 - 7.430   pCO2, Ven 35.7 (L) 44.0 - 60.0 mmHg   pO2, Ven 53.0 (H) 32.0 - 45.0 mmHg   Bicarbonate 20.8 20.0 - 28.0 mmol/L   TCO2 22 22 - 32 mmol/L   O2 Saturation 86.0 %   Acid-base deficit 4.0 (H) 0.0 - 2.0 mmol/L   Sodium 146 (H) 135 - 145 mmol/L   Potassium 4.1 3.5 - 5.1 mmol/L   Calcium, Ion 1.11 (L) 1.15 - 1.40 mmol/L   HCT 43.0 39.0 - 52.0 %   Hemoglobin 14.6 13.0 - 17.0 g/dL   Sample type VENOUS   MRSA PCR Screening     Status: None   Collection Time: 12/07/20 12:54 AM   Specimen: Nasopharyngeal  Result Value Ref Range   MRSA by PCR NEGATIVE NEGATIVE    Comment:        The GeneXpert MRSA Assay (FDA approved for NASAL specimens only), is one component of a comprehensive MRSA colonization surveillance program. It is not intended to diagnose MRSA infection nor to guide or monitor treatment  for MRSA infections. Performed at [facility], [address] [place], [place] [address]   Heparin level (unfractionated)     Status: None   Collection Time: 12/07/20  1:00 AM  Result Value Ref Range   Heparin Unfractionated 0.45 0.30 - 0.70 IU/mL    Comment: (NOTE) The clinical reportable range upper limit is being lowered to >1.10 to align with the FDA approved guidance for the current laboratory assay.  If heparin results are below expected values, and patient dosage has  been confirmed, suggest follow up testing of antithrombin III levels. Performed at [facility], [address] [place], [place] [address]   CBC     Status: None   Collection Time: 12/07/20  1:00 AM  Result Value Ref Range   WBC 8.1 4.0 - 10.5 K/uL   RBC 5.07 4.22 - 5.81 MIL/uL   Hemoglobin 15.1 13.0 - 17.0 g/dL   HCT 38.1 77.1 - 16.5 %   MCV 89.3 80.0 - 100.0 fL   MCH 29.8 26.0 - 34.0 pg   MCHC 33.3 30.0 - 36.0 g/dL   RDW 79.0 38.3 - 33.8 %   Platelets 150 150 - 400 K/uL   nRBC 0.0 0.0 - 0.2 %    Comment: Performed at [facility], [address], [place], [place] [address]  Lipid panel     Status: Abnormal   Collection Time: 12/07/20  1:00 AM  Result Value Ref Range   Cholesterol 170 0 - 200 mg/dL   Triglycerides 56 <166 mg/dL   HDL 32 (L) >06 mg/dL   Total CHOL/HDL Ratio 5.3 RATIO   VLDL 11 0 - 40 mg/dL   LDL Cholesterol 004 (H) 0 - 99 mg/dL    Comment:        Total Cholesterol/HDL:CHD Risk Coronary Heart Disease Risk Table                     Men   Women  1/2 Average Risk   3.4   3.3  Average Risk       5.0   4.4  2 X Average Risk   9.6   7.1  3 X Average Risk  23.4   11.0  Use the calculated Patient Ratio above and the CHD Risk Table to determine the patient's CHD Risk.        ATP III CLASSIFICATION (LDL):  <100     mg/dL   Optimal  462-703  mg/dL   Near or Above                    Optimal  130-159  mg/dL   Borderline  500-938  mg/dL   High  >182      mg/dL   Very High Performed at [facility], [address] [place], [place] [address]   T4, free     Status: None   Collection Time: 12/07/20  1:00 AM  Result Value Ref Range   Free T4 0.99 0.61 - 1.12 ng/dL    Comment: (NOTE) Biotin ingestion may interfere with free T4 tests. If the results are inconsistent with the TSH level, previous test results, or the clinical presentation, then consider biotin interference. If needed, order repeat testing after stopping biotin. Performed at [facility], [address], [place], [place] [address]   Comprehensive metabolic panel     Status: Abnormal   Collection Time: 12/07/20  1:00 AM  Result Value Ref Range   Sodium 143 135 - 145 mmol/L   Potassium 3.9 3.5 - 5.1 mmol/L   Chloride 114 (H) 98 - 111 mmol/L   CO2 21 (L) 22 - 32 mmol/L   Glucose, Bld 109 (H) 70 - 99 mg/dL    Comment: Glucose reference range applies only to samples taken after fasting for at least 8 hours.   BUN 8 6 - 20 mg/dL   Creatinine, Ser 9.38 0.61 - 1.24 mg/dL   Calcium 8.5 (L) 8.9 - 10.3 mg/dL   Total Protein 5.7 (L) 6.5 - 8.1 g/dL   Albumin 3.3 (L) 3.5 - 5.0 g/dL   AST 13 (L) 15 - 41 U/L   ALT 12 0 - 44 U/L   Alkaline Phosphatase 44 38 - 126 U/L   Total Bilirubin 0.6 0.3 - 1.2 mg/dL   GFR, Estimated >10 >17 mL/min    Comment: (NOTE) Calculated using the CKD-EPI Creatinine Equation (2021)    Anion gap 8 5 - 15    Comment: Performed at [facility], [address] [place], [place] [address]  Lactic acid, plasma     Status: None   Collection Time: 12/07/20  1:00 AM  Result Value Ref Range   Lactic Acid, Venous 1.1 0.5 - 1.9 mmol/L    Comment: Performed at [facility], [address] [place], [place] [address]  Heparin level (unfractionated)     Status: None   Collection Time: 12/07/20  7:30 AM  Result Value Ref Range   Heparin Unfractionated 0.33 0.30 - 0.70 IU/mL    Comment: (NOTE) The clinical reportable range upper limit is  being lowered to >1.10 to align with the FDA approved guidance for the current laboratory assay.  If heparin results are below expected values, and patient dosage has  been confirmed, suggest follow up testing of antithrombin III levels. Performed at [facility], [address] [place], [place] [address]     Current Facility-Administered Medications  Medication Dose Route Frequency Provider Last Rate Last Admin  . 0.9 %  sodium chloride infusion   Intravenous Continuous Anderson, Chelsey L, DO 125 mL/hr at 12/07/20 0345 Infusion Verify at 12/07/20 0345  . acetaminophen (TYLENOL) tablet 650  mg  650 mg Oral Q6H PRN Cora CollumPaige, Victoria J, DO       Or  . acetaminophen (TYLENOL) suppository 650 mg  650 mg Rectal Q6H PRN Paige, Victoria J, DO      . benztropine (COGENTIN) tablet 0.5 mg  0.5 mg Oral BID Lauro FranklinPashayan, Garvis Downum S, MD      . divalproex (DEPAKOTE) DR tablet 500 mg  500 mg Oral TID Lauro FranklinPashayan, Tristian Sickinger S, MD      . hydrOXYzine (ATARAX/VISTARIL) tablet 50 mg  50 mg Oral Q4H PRN Idalia NeedlePaige, Victoria J, DO      . magnesium sulfate IVPB 1 g 100 mL  1 g Intravenous Once Sabino DickEspinoza, Alejandra, DO 100 mL/hr at 12/07/20 1236 1 g at 12/07/20 1236  . risperiDONE (RISPERDAL M-TABS) disintegrating tablet 1 mg  1 mg Oral BID Chelsea Pedretti, Mardelle MatteAlexander S, MD        Musculoskeletal: Strength & Muscle Tone: within normal limits Gait & Station: in bed during exam Patient leans: N/A            Psychiatric Specialty Exam:  Presentation  General Appearance: Casual  Eye Contact:Good  Speech:Clear and Coherent; Normal Rate  Speech Volume:Normal  Handedness:Right   Mood and Affect  Mood:Anxious  Affect:Labile   Thought Process  Thought Processes:Disorganized  Descriptions of Associations:Intact  Orientation:-- (Oriented only to place not to person or time)  Thought Content:Illogical; Paranoid Ideation  History of Schizophrenia/Schizoaffective disorder:Yes  Duration of Psychotic  Symptoms:No data recorded Hallucinations:Hallucinations: Visual Description of Visual Hallucinations: Reports seeing evil  Ideas of Reference:Paranoia  Suicidal Thoughts:Suicidal Thoughts: No  Homicidal Thoughts:Homicidal Thoughts: No   Sensorium  Memory:Immediate Poor; Recent Poor  Judgment:Impaired  Insight:Lacking   Executive Functions  Concentration:Fair  Attention Span:Fair  Recall:Poor  Fund of Knowledge:Poor  Language:Fair   Psychomotor Activity  Psychomotor Activity:Psychomotor Activity: Normal   Assets  Assets:Resilience   Sleep  Sleep:Sleep: Fair   Physical Exam: Physical Exam Vitals and nursing note reviewed.  Constitutional:      General: He is not in acute distress.    Appearance: Normal appearance. He is normal weight. He is not ill-appearing or toxic-appearing.  HENT:     Head: Normocephalic and atraumatic.  Cardiovascular:     Rate and Rhythm: Normal rate.  Pulmonary:     Effort: Pulmonary effort is normal.  Musculoskeletal:        General: Normal range of motion.  Neurological:     Mental Status: He is alert.    Review of Systems  Constitutional: Negative for chills and fever.  Respiratory: Negative for cough and shortness of breath.   Cardiovascular: Positive for chest pain (was not painful until asked if it was painful).  Gastrointestinal: Negative for abdominal pain, nausea and vomiting.  Neurological: Negative for weakness and headaches.  Psychiatric/Behavioral: Positive for hallucinations. Negative for depression and suicidal ideas. The patient is nervous/anxious.    Blood pressure (!) 113/98, pulse (!) 122, temperature 97.8 F (36.6 C), temperature source Oral, resp. rate 15, height 5\' 7"  (1.702 m), weight 79.5 kg, SpO2 97 %. Body mass index is 27.45 kg/m.  Treatment Plan Summary: Daily contact with patient to assess and evaluate symptoms and progress in treatment  Family Medicine was able to confirm that while in jail  he was on Risperdal 3 mg and Depakote 500 mg TID. [facility] he was on Risperdal 3 mg BID. Will restart BID dosing and increase it over the next few days, as he received 10  mg Haldol this AM will start Risperdal this evening. Will restart Depakote and Cogentin. Will continue to monitor.   -Start Risperdal 1 mg BID this evening -Start Depakote 500 mg TID -Start Cogentin 0.5 mg BID   Disposition: Recommend psychiatric Inpatient admission when medically cleared.  Lauro [place], MD 12/07/2020 12:57 PM

## 2020-12-07 NOTE — Progress Notes (Signed)
Got call from nurse that patient was pulling out IV.  Saw patient and evaluated.  Received 10 mg Haldol earlier and also received Cogentin.  Went into room and patient calm.  We discussed why we needed to check BP and if he would let us put IV's back in and he said this is fine.  Indicated to nurses if patient had repeat episode to call us and we would consider further sedation.     Jovita Kussmaul, MD 12/07/2020, 5:08 PM PGY-1, [facility] Family Medicine Service pager [phone]

## 2020-12-07 NOTE — Progress Notes (Signed)
FPTS Interim Progress Note  Received page from RN that patient refusing to go for CT head.  He has already received Haldol 10 mg po at 1150.  She reports that he will not let her near him.  I went to assess patient.  He is now willing to have CT head done and is agreeable to having some medication to help calm him during procedure.   Will order for Haldol 5 mg IV prior to CT head  Dana Allan, MD 12/07/2020, 12:59 PM PGY-2, [facility] Family Medicine Service pager [phone]

## 2020-12-07 NOTE — Plan of Care (Signed)
  Problem: Education: Goal: Knowledge of General Education information will improve Description: Including pain rating scale, medication(s)/side effects and non-pharmacologic comfort measures Outcome: Not Progressing   

## 2020-12-07 NOTE — Evaluation (Signed)
Physical Therapy Evaluation/ Discharge Patient Details Name: Anthony Knox MRN: [id number] DOB: January 12, 1961 Today's Date: 12/07/2020   History of Present Illness  60 yo admittted 5/26 from [facility] after syncope with new AFib with RVR, resultant hypotension and bradycardia after diltiazem. PMhx: schizoaffective disorder  Clinical Impression  Pt pleasant and very willing to get up to mobilize. Pt able to exit bed with minor assist of trunk, walk in hall and performing basic transfers. Supervision for safety and lines with pt greatest deficits cognition with pt interested in behavioral health which is appropriate given pt's current mobility. No further acute therapy needs at this time and recommend ambulation with nursing and mobility tech. Will sign off with pt agreeable.      Follow Up Recommendations No PT follow up;Supervision - Intermittent    Equipment Recommendations  None recommended by PT    Recommendations for Other Services       Precautions / Restrictions Precautions Precautions: Fall;Other (comment) Precaution Comments: watch HR      Mobility  Bed Mobility Overal bed mobility: Needs Assistance Bed Mobility: Supine to Sit     Supine to sit: Min assist     General bed mobility comments: HHA to fully elevate trunk from surface    Transfers Overall transfer level: Needs assistance   Transfers: Sit to/from Stand Sit to Stand: Supervision         General transfer comment: supervision for lines and safety  Ambulation/Gait Ambulation/Gait assistance: Supervision Gait Distance (Feet): 300 Feet Assistive device: None Gait Pattern/deviations: WFL(Within Functional Limits)   Gait velocity interpretation: >2.62 ft/sec, indicative of community ambulatory General Gait Details: pt with 2 minor sway periods of gait with pt able to recover and decreased gait speed to maintain balance  Stairs            Wheelchair Mobility    Modified Rankin  (Stroke Patients Only)       Balance Overall balance assessment: Mild deficits observed, not formally tested                                           Pertinent Vitals/Pain Pain Assessment: No/denies pain    Home Living Family/patient expects to be discharged to:: Private residence Living Arrangements: Alone   Type of Home: House Home Access: Stairs to enter Entrance Stairs-Rails: Doctor, general practice of Steps: 4+2 Home Layout: One level Home Equipment: None      Prior Function Level of Independence: Independent         Comments: pt does not drive and not able to state how he gets groceries     Hand Dominance        Extremity/Trunk Assessment   Upper Extremity Assessment Upper Extremity Assessment: Overall WFL for tasks assessed    Lower Extremity Assessment Lower Extremity Assessment: Overall WFL for tasks assessed    Cervical / Trunk Assessment Cervical / Trunk Assessment: Normal  Communication   Communication: No difficulties  Cognition Arousal/Alertness: Awake/alert Behavior During Therapy: Flat affect Overall Cognitive Status: Impaired/Different from baseline Area of Impairment: Memory;Following commands;Safety/judgement;Problem solving                     Memory: Decreased short-term memory Following Commands: Follows one step commands consistently Safety/Judgement: Decreased awareness of deficits   Problem Solving: Slow processing General Comments: pt with tangential conversation but following commands  General Comments      Exercises     Assessment/Plan    PT Assessment Patent does not need any further PT services  PT Problem List         PT Treatment Interventions      PT Goals (Current goals can be found in the Care Plan section)  Acute Rehab PT Goals PT Goal Formulation: All assessment and education complete, DC therapy    Frequency     Barriers to discharge         Co-evaluation               AM-PAC PT "6 Clicks" Mobility  Outcome Measure Help needed turning from your back to your side while in a flat bed without using bedrails?: A Little Help needed moving from lying on your back to sitting on the side of a flat bed without using bedrails?: None Help needed moving to and from a bed to a chair (including a wheelchair)?: A Little Help needed standing up from a chair using your arms (e.g., wheelchair or bedside chair)?: A Little Help needed to walk in hospital room?: None Help needed climbing 3-5 steps with a railing? : None 6 Click Score: 21    End of Session Equipment Utilized During Treatment: Gait belt Activity Tolerance: Patient tolerated treatment well Patient left: Other (comment) (standing with OT at sink) Nurse Communication: Mobility status PT Visit Diagnosis: Other abnormalities of gait and mobility (R26.89)    Time: 5284-1324 PT Time Calculation (min) (ACUTE ONLY): 12 min   Charges:   PT Evaluation $PT Eval Moderate Complexity: 1 Mod          Xavia Kniskern P, PT Acute Rehabilitation Services Pager: [phone] Office: [phone]   Laray Rivkin B Loreley Schwall 12/07/2020, 11:05 AM

## 2020-12-07 NOTE — Evaluation (Signed)
Occupational Therapy Evaluation Patient Details Name: Anthony Knox MRN: [id number] DOB: March 04, 1961 Today's Date: 12/07/2020    History of Present Illness 60 yo admittted 5/26 from [facility] after syncope with new AFib with RVR, resultant hypotension and bradycardia after diltiazem. PMhx: schizoaffective disorder   Clinical Impression   PTA patient reports independent and living alone. Admitted for above and presenting with problem list below, including impaired cognition and decreased cardiopulmonary endurance. Patient currently disoriented to time, follows simple commands with increased time and presents with decreased attention, sequencing, recall, and safety awareness.  Scored 25/28 (signifcant impairment) on short blessed test. He demonstrates ability to complete ADLs with up to min guard assist, transfers with min guard fading to supervision.  His HR up to 144 at EOB when donning socks and 149 when brushing teeth at sink.  Patient will benefit from further OT services while admitted to optimize safety and independence with ADLs, dc recommendations below.     Follow Up Recommendations  Other (comment);Supervision/Assistance - 24 hour (behavior health inpatient)    Equipment Recommendations  None recommended by OT    Recommendations for Other Services       Precautions / Restrictions Precautions Precautions: Fall;Other (comment) Precaution Comments: watch HR Restrictions Weight Bearing Restrictions: No      Mobility Bed Mobility Overal bed mobility: Needs Assistance Bed Mobility: Supine to Sit     Supine to sit: Min assist     General bed mobility comments: HHA to fully elevate trunk from surface    Transfers Overall transfer level: Needs assistance   Transfers: Sit to/from Stand Sit to Stand: Min guard         General transfer comment: min guard for safety and line mgmt    Balance Overall balance assessment: Mild deficits observed, not formally  tested                                         ADL either performed or assessed with clinical judgement   ADL Overall ADL's : Needs assistance/impaired     Grooming: Min guard;Standing   Upper Body Bathing: Set up;Sitting   Lower Body Bathing: Min guard;Sit to/from stand   Upper Body Dressing : Set up;Sitting   Lower Body Dressing: Min guard;Sit to/from stand Lower Body Dressing Details (indicate cue type and reason): able to don socks, min guard sit to stand Toilet Transfer: Min guard;Ambulation           Functional mobility during ADLs: Min guard;Cueing for safety General ADL Comments: pt limited by cognition and safety     Vision   Vision Assessment?: No apparent visual deficits     Perception     Praxis      Pertinent Vitals/Pain Pain Assessment: No/denies pain     Hand Dominance Right   Extremity/Trunk Assessment Upper Extremity Assessment Upper Extremity Assessment: Overall WFL for tasks assessed   Lower Extremity Assessment Lower Extremity Assessment: Defer to PT evaluation   Cervical / Trunk Assessment Cervical / Trunk Assessment: Normal   Communication Communication Communication: No difficulties   Cognition Arousal/Alertness: Awake/alert Behavior During Therapy: Flat affect Overall Cognitive Status: Impaired/Different from baseline Area of Impairment: Orientation;Memory;Following commands;Attention;Safety/judgement;Awareness;Problem solving                 Orientation Level: Disoriented to;Time Current Attention Level: Sustained Memory: Decreased short-term memory Following Commands: Follows one step commands consistently;Follows one  step commands with increased time;Follows multi-step commands inconsistently Safety/Judgement: Decreased awareness of safety;Decreased awareness of deficits Awareness: Emergent Problem Solving: Slow processing;Difficulty sequencing;Requires verbal cues General Comments: pt tangentail,  following commands but difficulty sequencing tasks.  Disoriented to time. Short blessed test scored: 25/28 (significant impairments)   General Comments  HR up to 149 with grooming at sink    Exercises     Shoulder Instructions      Home Living Family/patient expects to be discharged to:: Private residence Living Arrangements: Alone   Type of Home: House Home Access: Stairs to enter Secretary/administrator of Steps: 4+2 Entrance Stairs-Rails: Right;Left Home Layout: One level     Bathroom Shower/Tub: Chief Strategy Officer: Standard     Home Equipment: None          Prior Functioning/Environment Level of Independence: Independent        Comments: reports independent with ADLs, IADLs; does not drive and unable to state how he gets groceries        OT Problem List: Decreased activity tolerance;Decreased cognition;Decreased safety awareness;Cardiopulmonary status limiting activity      OT Treatment/Interventions: Self-care/ADL training;DME and/or AE instruction;Cognitive remediation/compensation;Patient/family education;Balance training    OT Goals(Current goals can be found in the care plan section) Acute Rehab OT Goals Patient Stated Goal: none stated Time For Goal Achievement: 12/21/20 Potential to Achieve Goals: Good  OT Frequency: Min 2X/week   Barriers to D/C:            Co-evaluation              AM-PAC OT "6 Clicks" Daily Activity     Outcome Measure Help from another person eating meals?: None Help from another person taking care of personal grooming?: A Little Help from another person toileting, which includes using toliet, bedpan, or urinal?: A Little Help from another person bathing (including washing, rinsing, drying)?: A Little Help from another person to put on and taking off regular upper body clothing?: A Little Help from another person to put on and taking off regular lower body clothing?: A Little 6 Click Score: 19    End of Session Equipment Utilized During Treatment: Gait belt Nurse Communication: Mobility status  Activity Tolerance: Patient tolerated treatment well Patient left: in chair;with call bell/phone within reach;with nursing/sitter in room  OT Visit Diagnosis: Other abnormalities of gait and mobility (R26.89);Other symptoms and signs involving cognitive function                Time: 1045-1105 OT Time Calculation (min): 20 min Charges:  OT General Charges $OT Visit: 1 Visit OT Evaluation $OT Eval Moderate Complexity: 1 Mod  Barry Brunner, OT Acute Rehabilitation Services Pager [phone] Office [phone]   Chancy Milroy 12/07/2020, 1:54 PM

## 2020-12-07 NOTE — Progress Notes (Addendum)
Patient becoming very agitated when writer attempts to give any medications or treatments.  Notified Dr Jerald Kief patient became verbally aggressive with phlebotomy and ran her out of the room. Patient heart rate up to 180s during episode of yelling.  Refused blood draw from second phlebotomist.  Writer was able to get K+ times 2 and magnesium in patient.  Haldol 10 mg po given by telling patient it would help relax his heart and make the alarms stop, like a beta blocker would do.  Patient refuses to go to CT stating it will Alles his brain and he will be stabbed by nails on the moving table.  Patient refused ECHO ans told the technician he didn't want any women on his team.  Refused the cogentin even though writer told him what it was saying "it is the wrong color for cogentin, it was another color in jail."  Will ask another nurse to try to give the medication.    Patient is verbally abusive using profanity to get staff out of the room but is not being physically aggressive yet.  Will have the secretary inform security about the patient but we do not want them here at this time  [place] RN CRRN  Patient took cogentin and depakote from the charge nurse at 1430

## 2020-12-07 NOTE — Progress Notes (Signed)
ANTICOAGULATION CONSULT NOTE  Pharmacy Consult for heparin Indication: atrial fibrillation  No Known Allergies  Patient Measurements: Height: 5\' 7"  (170.2 cm) Weight: 79.5 kg (175 lb 4.3 oz) IBW/kg (Calculated) : 66.1 Heparin Dosing Weight: 83 KG   Vital Signs: Temp: 98.1 F (36.7 C) (05/27 0050) Temp Source: Oral (05/27 0050) BP: 128/94 (05/27 0050) Pulse Rate: 93 (05/27 0050)  Labs: Recent Labs    12/06/20 1705 12/06/20 1719 12/06/20 1942 12/06/20 2321 12/07/20 0100  HGB 15.0  --   --  14.6 15.1  HCT 45.1  --   --  43.0 45.3  PLT 155  --   --   --  150  LABPROT 13.6  --   --   --   --   INR 1.0  --   --   --   --   HEPARINUNFRC  --   --   --   --  0.45  CREATININE 1.06  --   --   --   --   TROPONINIHS  --  3 3  --   --     Estimated Creatinine Clearance: 77.8 mL/min (by C-G formula based on SCr of 1.06 mg/dL).  Assessment: 60 yo patient presenting from behavioral health in respiratory distress and loss of consciousness. Per brother, patient was released from prison on 5/23 and takes no medications, though this is difficult to confirm. EMS noted patient was in afib during transport to ED.  CBC and Scr wnl. Heparin gtt for afib.  Heparin level therapeutic (0.45) on gtt at 1100 units/hr. No bleeding noted.  Goal of Therapy:  Heparin level 0.3-0.7 units/ml Monitor platelets by anticoagulation protocol: Yes   Plan:  Continue heparin a 1100 units/hr  F/u 6 hr heparin level to confirm  6/23, PharmD, BCPS Please see amion for complete clinical pharmacist phone list 12/07/2020 1:49 AM

## 2020-12-07 NOTE — Progress Notes (Signed)
   12/07/20 1300  Assess: MEWS Score  Pulse Rate (!) 103  ECG Heart Rate (!) 125  Resp 16  SpO2 95 %  Assess: MEWS Score  MEWS Temp 0  MEWS Systolic 0  MEWS Pulse 2  MEWS RR 0  MEWS LOC 0  MEWS Score 2  MEWS Score Color Yellow  Assess: if the MEWS score is Yellow or Red  Were vital signs taken at a resting state? Yes  Focused Assessment No change from prior assessment  Early Detection of Sepsis Score *See Row Information* Low  MEWS guidelines implemented *See Row Information* No, previously yellow, continue vital signs every 4 hours  Notify: Charge Nurse/RN  Name of Charge Nurse/RN Notified Wilkie Aye  Date Charge Nurse/RN Notified 12/07/20  Time Charge Nurse/RN Notified 1300  Notify: Provider  Provider Name/Title Jerald Kief  Date Provider Notified 12/07/20  Time Provider Notified 1300  Notification Type Page  Provider response See new orders  Document  Progress note created (see row info) Yes

## 2020-12-08 ENCOUNTER — Inpatient Hospital Stay (HOSPITAL_COMMUNITY): Payer: PPO

## 2020-12-08 DIAGNOSIS — R55 Syncope and collapse: Secondary | ICD-10-CM | POA: Diagnosis not present

## 2020-12-08 DIAGNOSIS — I4891 Unspecified atrial fibrillation: Secondary | ICD-10-CM

## 2020-12-08 DIAGNOSIS — R7303 Prediabetes: Secondary | ICD-10-CM

## 2020-12-08 LAB — CBC
HCT: 44 % (ref 39.0–52.0)
Hemoglobin: 14.8 g/dL (ref 13.0–17.0)
MCH: 29.8 pg (ref 26.0–34.0)
MCHC: 33.6 g/dL (ref 30.0–36.0)
MCV: 88.7 fL (ref 80.0–100.0)
Platelets: 169 10*3/uL (ref 150–400)
RBC: 4.96 MIL/uL (ref 4.22–5.81)
RDW: 14.3 % (ref 11.5–15.5)
WBC: 7.3 10*3/uL (ref 4.0–10.5)
nRBC: 0 % (ref 0.0–0.2)

## 2020-12-08 LAB — T3, FREE: T3, Free: 2.7 pg/mL (ref 2.0–4.4)

## 2020-12-08 LAB — ECHOCARDIOGRAM COMPLETE
AR max vel: 4.15 cm2
AV Area VTI: 4.34 cm2
AV Area mean vel: 4.54 cm2
AV Mean grad: 1 mmHg
AV Peak grad: 2.4 mmHg
Ao pk vel: 0.77 m/s
Area-P 1/2: 1.94 cm2
Height: 67 in
S' Lateral: 2.8 cm
Weight: 2804.25 [oz_av]

## 2020-12-08 MED ORDER — ENOXAPARIN SODIUM 40 MG/0.4ML IJ SOSY
40.0000 mg | PREFILLED_SYRINGE | Freq: Every day | INTRAMUSCULAR | Status: DC
  Administered 2020-12-08 – 2020-12-13 (×6): 40 mg via SUBCUTANEOUS
  Filled 2020-12-08 (×6): qty 0.4

## 2020-12-08 MED ORDER — PERFLUTREN LIPID MICROSPHERE
1.0000 mL | INTRAVENOUS | Status: AC | PRN
  Administered 2020-12-08: 3 mL via INTRAVENOUS
  Filled 2020-12-08: qty 10

## 2020-12-08 NOTE — Progress Notes (Signed)
Patient states yes that he got something in jail but he is not a reliable historian

## 2020-12-08 NOTE — Progress Notes (Signed)
Family Medicine Teaching Service Daily Progress Note Intern Pager: [phone]  Patient name: Anthony Knox Medical record number: [id number] Date of birth: 04/24/1961 Age: 60 y.o. Gender: male  Primary Care Provider: Patient, No Pcp Per (Inactive) Consultants: Cardiology, Psychiatry Code Status: FULL  Pt Overview and Major Events to Date:  5/26: Admitted   Assessment and Plan: SAUD BAIL a 60 y.o.malepresenting after a syncopal episode found to have new onset afib with RVR. PMH is significant forSchizoaffective disorder.  Atrial Fibrillation: RVR has resolved HR in 80's.  Echo has still not been performed yet.  Has remained hemodynamically stable with improved heart rates since diltiazem was stopped (following his episodes of hypotension/bradycardia).  No indication for long-term anticoagulation.  Additionally, feel that patient is not reliable to take medications properly. -Cardiology is following, appreciate recommendations -Follow-up echocardiogram - May need low dose metoprolol for rate when ambulating   Syncope appears to be possibly vagal episode. CT negative for acute findings. Echocardiogram pending. Only atrial fibrillation on telemetry. - Stop IV fluids - Orthostatic vital signs - Echocardiogram   Schizoaffective Disorder  MDD  Psychosis CT head obtained yesterday to exclude other causes of his altered state, only remarkable for partially empty sella.  I do not believe this is related but an incidental finding.  There were no acute findings to suggest other cause of his AMS.  Believe this is likely from his uncontrolled psychiatric conditions.  Psychiatry is following, they recommend inpatient admission after medical stabilization.  Feel that we are approaching this, just waiting on echocardiogram results. Patient received 25 mg total of Haldol yesterday (15 mg to undergo CT Head, the other 10 mg needed for periods of agitation). Psychiatric medications that he was  receiving in the jail: Risperidone 3 mg daily, Depakote 500 mg 3 times daily. -Psychiatry following, appreciate recommendations and care  -Continue risperidone 1 mg BID  -Continue cogentin 0.5 mg BID  -Continue Depakote 500 mg 3 times daily -Continue 1:1 sitter  -Hydroxyzine 50 mg every 4 hours as needed for anxiety, sleep   Subclinical Hypothyroidism Elevated TSH with normal free T3/T4. -Repeat in 4-6 weeks as outpatient   Prediabetes Hgb A1c 5.9.  FEN/GI: Heart healthy PPx: Lovenox   Status is: Inpatient  Remains inpatient appropriate because:Ongoing diagnostic testing needed not appropriate for outpatient work up and Unsafe d/c plan   Dispo: The patient is from: Home              Anticipated d/c is to: Inpatient psych facility              Patient currently is not medically stable to d/c.   Difficult to place patient No Will likely be stable for discharge 5/29 pending echocardiogram    Subjective:  Patient pleasant this morning. Does not know where he is located or date. Seems distracted, staring at examiner's left shoulder.   Objective: Temp:  [97.7 F (36.5 C)-98.3 F (36.8 C)] 97.7 F (36.5 C) (05/28 0300) Pulse Rate:  [80-176] 87 (05/28 0300) Resp:  [14-24] 14 (05/28 0300) BP: (94-138)/(66-86) 98/66 (05/28 0300) SpO2:  [91 %-96 %] 96 % (05/28 0300) Physical Exam: General: Pleasant but distracted Cardiovascular: irregular rate normal rhythm Respiratory: lungs clears Abdomen: soft Extremities: no edema today   Laboratory: Recent Labs  Lab 12/06/20 1705 12/06/20 2321 12/07/20 0100 12/08/20 0019  WBC 6.6  --  8.1 7.3  HGB 15.0 14.6 15.1 14.8  HCT 45.1 43.0 45.3 44.0  PLT 155  --  150  169   Recent Labs  Lab 12/06/20 1705 12/06/20 2321 12/07/20 0100  NA 139 146* 143  K 3.3* 4.1 3.9  CL 109  --  114*  CO2 22  --  21*  BUN 12  --  8  CREATININE 1.06  --  0.87  CALCIUM 8.1*  --  8.5*  PROT  --   --  5.7*  BILITOT  --   --  0.6  ALKPHOS  --    --  44  ALT  --   --  12  AST  --   --  13*  GLUCOSE 150*  --  109*      Imaging/Diagnostic Tests: CT HEAD WO CONTRAST  Result Date: 12/07/2020 CLINICAL DATA:  Delirium. EXAM: CT HEAD WITHOUT CONTRAST TECHNIQUE: Contiguous axial images were obtained from the base of the skull through the vertex without intravenous contrast. COMPARISON:  None. FINDINGS: Brain: No evidence of acute large vascular territory infarction, hemorrhage, hydrocephalus, extra-axial collection or mass lesion/mass effect. Partially empty sella. Vascular: No hyperdense vessel identified. Skull: No acute fracture. Sinuses/Orbits: Visualized sinuses are largely clear. Unremarkable orbits. Other: No mastoid effusions. IMPRESSION: 1. No evidence of acute intracranial abnormality. 2. Partially empty sella. This finding is nonspecific and may be incidental, but can be seen with idiopathic intracranial hypertension in the correct clinical setting. Electronically Signed   By: Feliberto Harts MD   On: 12/07/2020 19:03     Sabino Dick, DO 12/08/2020, 7:52 AM PGY-1, [facility] Family Medicine FPTS Intern pager: [phone], text pages welcome

## 2020-12-08 NOTE — Plan of Care (Signed)
  Problem: Health Behavior/Discharge Planning: Goal: Ability to manage health-related needs will improve Outcome: Progressing  Patient agreeable to POC for the day

## 2020-12-08 NOTE — Progress Notes (Signed)
Progress Note  Patient Name: Anthony Knox Date of Encounter: 12/08/2020  [facility] Cardiologist: None New  Subjective   No complaints on interview today, appears more appropriate/calm today. No chest pain, breathing stable. Planned for echo today.  Inpatient Medications    Scheduled Meds: . benztropine  0.5 mg Oral BID  . divalproex  500 mg Oral TID  . risperiDONE  1 mg Oral BID   Continuous Infusions: . sodium chloride 125 mL/hr at 12/08/20 0300   PRN Meds: acetaminophen **OR** acetaminophen, hydrOXYzine   Vital Signs    Vitals:   12/07/20 1640 12/07/20 2010 12/07/20 2307 12/08/20 0300  BP:  138/86 94/78 98/66   Pulse:  84 80 87  Resp: (!) 24 15 17 14   Temp:  98.3 F (36.8 C) 98.3 F (36.8 C) 97.7 F (36.5 C)  TempSrc:  Oral Oral Oral  SpO2:  94% 96% 96%  Weight:      Height:        Intake/Output Summary (Last 24 hours) at 12/08/2020 0926 Last data filed at 12/08/2020 0600 Gross per 24 hour  Intake 3492.12 ml  Output 1700 ml  Net 1792.12 ml   Last 3 Weights 12/07/2020 12/06/2020 05/07/2018  Weight (lbs) 175 lb 4.3 oz 190 lb 165 lb  Weight (kg) 79.5 kg 86.183 kg 74.844 kg      Telemetry    Atrial fibrillation, rates 80s-90s at rest with rates to 110s with exertion, brief episodes to 140 bpm that improve with rest - Personally Reviewed  ECG    12/06/20 Initially afib RVR, then afib with controlled ventricular rate - Personally Reviewed  Physical Exam   GEN: Well nourished, well developed in no acute distress NECK: No JVD CARDIAC: irregularly irregular rhythm, normal S1 and S2, no rubs or gallops. No murmur. VASCULAR: Radial pulses 2+ bilaterally.  RESPIRATORY:  Clear to auscultation without rales, wheezing or rhonchi  ABDOMEN: Soft, non-tender, non-distended MUSCULOSKELETAL:  Moves all 4 limbs independently SKIN: Warm and dry, no edema NEUROLOGIC:  No focal neuro deficits noted. PSYCHIATRIC:  Normal affect   Labs    High Sensitivity  Troponin:   Recent Labs  Lab 12/06/20 1719 12/06/20 1942  TROPONINIHS 3 3      Chemistry Recent Labs  Lab 12/06/20 1705 12/06/20 2321 12/07/20 0100  NA 139 146* 143  K 3.3* 4.1 3.9  CL 109  --  114*  CO2 22  --  21*  GLUCOSE 150*  --  109*  BUN 12  --  8  CREATININE 1.06  --  0.87  CALCIUM 8.1*  --  8.5*  PROT  --   --  5.7*  ALBUMIN  --   --  3.3*  AST  --   --  13*  ALT  --   --  12  ALKPHOS  --   --  44  BILITOT  --   --  0.6  GFRNONAA >60  --  >60  ANIONGAP 8  --  8     Hematology Recent Labs  Lab 12/06/20 1705 12/06/20 2321 12/07/20 0100 12/08/20 0019  WBC 6.6  --  8.1 7.3  RBC 5.02  --  5.07 4.96  HGB 15.0 14.6 15.1 14.8  HCT 45.1 43.0 45.3 44.0  MCV 89.8  --  89.3 88.7  MCH 29.9  --  29.8 29.8  MCHC 33.3  --  33.3 33.6  RDW 14.1  --  14.3 14.3  PLT 155  --  150  169    BNPNo results for input(s): BNP, PROBNP in the last 168 hours.   DDimer No results for input(s): DDIMER in the last 168 hours.   Radiology    CT HEAD WO CONTRAST  Result Date: 12/07/2020 CLINICAL DATA:  Delirium. EXAM: CT HEAD WITHOUT CONTRAST TECHNIQUE: Contiguous axial images were obtained from the base of the skull through the vertex without intravenous contrast. COMPARISON:  None. FINDINGS: Brain: No evidence of acute large vascular territory infarction, hemorrhage, hydrocephalus, extra-axial collection or mass lesion/mass effect. Partially empty sella. Vascular: No hyperdense vessel identified. Skull: No acute fracture. Sinuses/Orbits: Visualized sinuses are largely clear. Unremarkable orbits. Other: No mastoid effusions. IMPRESSION: 1. No evidence of acute intracranial abnormality. 2. Partially empty sella. This finding is nonspecific and may be incidental, but can be seen with idiopathic intracranial hypertension in the correct clinical setting. Electronically Signed   By: Feliberto Harts MD   On: 12/07/2020 19:03   DG Chest Port 1 View  Result Date: 12/06/2020 CLINICAL DATA:   Atrial fibrillation. EXAM: PORTABLE CHEST 1 VIEW COMPARISON:  08/08/2010 FINDINGS: Low lung volumes. Borderline cardiomegaly likely accentuated by technique. No pulmonary edema, focal airspace disease, large pleural effusion or pneumothorax. No acute osseous abnormalities are seen. IMPRESSION: Low lung volumes with borderline cardiomegaly. Electronically Signed   By: Narda Rutherford M.D.   On: 12/06/2020 17:31    Cardiac Studies   Echo pending  Patient Profile     60 y.o. male without known prior cardiac history, PMH of psychosis (admission 06/2018), major depression with severe recurrence in 2019, schozoaffective disorder presenting after syncope, found to have atrial fibrillation with RVR.  Assessment & Plan    New onset atrial fibrillation, presenting with RVR: complicated by hypotension and bradycardia when placed on diltiazem drip -CHA2DS2/VAS Stroke Risk Points= 0 -please see extensive risk/benefit discussion from 12/07/20.  Syncope: witnessed. No vitals prior to event. Noted to have incontinence with loss of consciousness. Unclear trigger, atrial fibrillation/arrhythmia may be cause or effect, no way to discern at this time. Of note, physical exam describes bradycardia and HR after event charted as 45 bpm, with BP 68/42--given that he presented to ER in afib RVR, this points more towards something other than afib as a cause, but cannot be certain. Ruling out structural heart issues with echo, scheduled for today.  Will follow up on echo. If abnormal, will leave additional recommendations. If normal, we will sign off.  [facility] will sign off pending echo results.   Medication Recommendations:  No anticoagulation or rate control agents unless he is going to be in a long term supervised medical environment with medication administration Other recommendations (labs, testing, etc):  None Follow up as an outpatient:  He will be going to inpatient psych per the notes after he is  medically clear. Please contact the cardiology cardmaster as he approaches final discharge so that we can arrange for outpatient follow up.  For questions or updates, please contact [facility] Please consult [email or web address] for contact info under     Signed, Jodelle Red, MD  12/08/2020, 9:26 AM

## 2020-12-08 NOTE — Plan of Care (Signed)

## 2020-12-08 NOTE — Progress Notes (Signed)
  Echocardiogram 2D Echocardiogram has been performed.  Anthony Knox 12/08/2020, 2:12 PM

## 2020-12-08 NOTE — Progress Notes (Signed)
Interim Progress Note  In to check on patient. HR consistently in 80's during my encounter. Patient denied any symptoms, stated that he felt well. Had no problems getting his echo done.  Echo reviewed, EF 55-60% with mild LVH. No valvular abnormalities noted. There is mild dilatation of the aortic root,  measuring 43 mm.   If no further cardiology recommendations then patient will be medically stable for inpatient psychiatric facility.

## 2020-12-08 NOTE — Progress Notes (Signed)
Patient more relaxed and not as combative as yesterday.  Declined vital signs nicely without using obscenities or yelling

## 2020-12-09 DIAGNOSIS — F250 Schizoaffective disorder, bipolar type: Secondary | ICD-10-CM | POA: Diagnosis not present

## 2020-12-09 DIAGNOSIS — R55 Syncope and collapse: Secondary | ICD-10-CM | POA: Diagnosis not present

## 2020-12-09 NOTE — Social Work (Signed)
CSW was alerted that pt is medically stable discharge for inpt. psych facility. CSW reached out to [place] and alerted him that pt is in need of a inpt. pschy faciliity.

## 2020-12-09 NOTE — Consult Note (Addendum)
[facility] Face-to-Face Psychiatry Consult   Reason for Consult:  Psychotic with history of Schizoaffective disorder Referring Physician:  Terisa Starr,  MD Patient Identification: Anthony Knox MRN:  [id number] Principal Diagnosis: Schizoaffective disorder, bipolar type (HCC) Diagnosis:  Principal Problem:   Schizoaffective disorder, bipolar type (HCC) Active Problems:   Atrial fibrillation with RVR (HCC)   Syncope   Atrial fibrillation (HCC)   Subclinical hypothyroidism   Prediabetes   Total Time spent with patient: 30 minutes  Subjective:   Anthony Knox is a 60 y.o. male patient admitted to medical unit after a syncopal episode found to have new onset afib with RVR. PMH is significant for Schizoaffective disorder. Objective:   Patient seen today face to face, his chart is reviewed and treatment plan discussed with the staff nurse. He has not made much significant improvement mentally, his thought process remains disorganized and speech is tangential. Patient is still paranoid, says he does not trust anyone and ''sees evil in a lot of people.'' He does not think that others can hear his thoughts but still thinks that there are hidden images in the television. Today, he denies depression, anxiety and self harming thoughts. Patient still meet criteria for inpatient psychiatric admission for stabilization.   Past Psychiatric History: Schizoaffective Disorder, MDD,   Risk to Self:  No Risk to Others:  No Prior Inpatient Therapy:  Yes [facility] 2019 Prior Outpatient Therapy:    Past Medical History:  Past Medical History:  Diagnosis Date  . Schizophrenia (HCC)    No past surgical history on file. Family History: No family history on file. Family Psychiatric  History: Reports none Social History:  Social History   Substance and Sexual Activity  Alcohol Use Not Currently     Social History   Substance and Sexual Activity  Drug Use Never    Social History   Socioeconomic History  .  Marital status: Single    Spouse name: Not on file  . Number of children: Not on file  . Years of education: Not on file  . Highest education level: Not on file  Occupational History  . Not on file  Tobacco Use  . Smoking status: Unknown If Ever Smoked  . Smokeless tobacco: Not on file  Vaping Use  . Vaping Use: Unknown  Substance and Sexual Activity  . Alcohol use: Not Currently  . Drug use: Never  . Sexual activity: Not on file  Other Topics Concern  . Not on file  Social History Narrative  . Not on file   Social Determinants of Health   Financial Resource Strain: Not on file  Food Insecurity: Not on file  Transportation Needs: Not on file  Physical Activity: Not on file  Stress: Not on file  Social Connections: Not on file   Additional Social History:    Allergies:  No Known Allergies  Labs:  Results for orders placed or performed during the hospital encounter of 12/06/20 (from the past 48 hour(s))  CBC     Status: None   Collection Time: 12/08/20 12:19 AM  Result Value Ref Range   WBC 7.3 4.0 - 10.5 K/uL   RBC 4.96 4.22 - 5.81 MIL/uL   Hemoglobin 14.8 13.0 - 17.0 g/dL   HCT 62.9 52.8 - 41.3 %   MCV 88.7 80.0 - 100.0 fL   MCH 29.8 26.0 - 34.0 pg   MCHC 33.6 30.0 - 36.0 g/dL   RDW 24.4 01.0 - 27.2 %   Platelets 169  150 - 400 K/uL   nRBC 0.0 0.0 - 0.2 %    Comment: Performed at [facility], [address] [place], [place] [address]    Current Facility-Administered Medications  Medication Dose Route Frequency Provider Last Rate Last Admin  . acetaminophen (TYLENOL) tablet 650 mg  650 mg Oral Q6H PRN Cora Collum, DO       Or  . acetaminophen (TYLENOL) suppository 650 mg  650 mg Rectal Q6H PRN Paige, Victoria J, DO      . benztropine (COGENTIN) tablet 0.5 mg  0.5 mg Oral BID Lauro Franklin, MD   0.5 mg at 12/09/20 1044  . divalproex (DEPAKOTE) DR tablet 500 mg  500 mg Oral TID Lauro Franklin, MD   500 mg at 12/09/20 1044  .  enoxaparin (LOVENOX) injection 40 mg  40 mg Subcutaneous Daily Dana Allan, MD   40 mg at 12/09/20 1045  . hydrOXYzine (ATARAX/VISTARIL) tablet 50 mg  50 mg Oral Q4H PRN Paige, Victoria J, DO      . risperiDONE (RISPERDAL M-TABS) disintegrating tablet 1 mg  1 mg Oral BID Lauro Franklin, MD   1 mg at 12/09/20 1044    Musculoskeletal: Strength & Muscle Tone: within normal limits Gait & Station: in bed during exam Patient leans: N/A            Psychiatric Specialty Exam:  Presentation  General Appearance: Casual  Eye Contact:Good  Speech:Clear and Coherent; Normal Rate  Speech Volume:Normal  Handedness:Right   Mood and Affect  Mood:Anxious  Affect:Labile   Thought Process  Thought Processes:Disorganized  Descriptions of Associations:Intact  Orientation:-- (Oriented only to place not to person or time)  Thought Content:Illogical; Paranoid Ideation  History of Schizophrenia/Schizoaffective disorder:Yes  Duration of Psychotic Symptoms:No data recorded Hallucinations:No data recorded  Ideas of Reference:Paranoia  Suicidal Thoughts:No data recorded  Homicidal Thoughts:No data recorded   Sensorium  Memory:Immediate Poor; Recent Poor  Judgment:Impaired  Insight:Lacking   Executive Functions  Concentration:Fair  Attention Span:Fair  Recall:Poor  Fund of Knowledge:Poor  Language:Fair   Psychomotor Activity  Psychomotor Activity:No data recorded   Assets  Assets:Resilience   Sleep  Sleep:No data recorded   Physical Exam: Physical Exam Vitals and nursing note reviewed.  Constitutional:      General: He is not in acute distress.    Appearance: Normal appearance. He is normal weight. He is not ill-appearing or toxic-appearing.  HENT:     Head: Normocephalic and atraumatic.  Cardiovascular:     Rate and Rhythm: Normal rate.  Pulmonary:     Effort: Pulmonary effort is normal.  Musculoskeletal:        General: Normal range  of motion.  Neurological:     Mental Status: He is alert.    Review of Systems  Constitutional: Negative for chills and fever.  Respiratory: Negative for cough and shortness of breath.   Cardiovascular: Positive for chest pain (was not painful until asked if it was painful).  Gastrointestinal: Negative for abdominal pain, nausea and vomiting.  Neurological: Negative for weakness and headaches.  Psychiatric/Behavioral: Positive for hallucinations. Negative for depression and suicidal ideas. The patient is nervous/anxious.    Blood pressure 115/77, pulse 77, temperature 98.2 F (36.8 C), temperature source Oral, resp. rate 16, height 5\' 7"  (1.702 m), weight 79.5 kg, SpO2 96 %. Body mass index is 27.45 kg/m.  Treatment Plan Summary: Daily contact with patient to assess and evaluate symptoms and progress in treatment  Recommendations: -Continue  Depakote  Depakote 500 mg TID.  -Continue Risperdal 1 mg BID to be titrated up to 3 mg  BID dosing over the next few weeks -Continue Cogentin 0.5 mg BID for EPS prevention. -Social worker to facilitate inpatient psychiatric placement   Disposition: Recommend psychiatric Inpatient admission when medically cleared.  Thedore Mins, MD 12/09/2020 1:46 PM

## 2020-12-09 NOTE — Progress Notes (Signed)
CSW was contacted by admissions from [facility]. It was reported that Anthony Knox would have to declined due to the patient being a registered sex offender.   Crissie Reese, MSW, LCSW-A, LCAS-A Phone: [phone] Disposition/TOC

## 2020-12-09 NOTE — Progress Notes (Signed)
Family Medicine Teaching Service Daily Progress Note Intern Pager: [phone]  Patient name: Anthony Knox Medical record number: [id number] Date of birth: Jun 17, 1961 Age: 60 y.o. Gender: male  Primary Care Provider: Patient, No Pcp Per (Inactive) Consultants: psych, cards Code Status: full  Pt Overview and Major Events to Date:  5/26 - admitted 5/28 - echo performed  Assessment and Plan: SULIMAN TERMINI a 60 y.o.malepresenting after a syncopal episode found to have new onset afib with RVR. PMH is significant forSchizoaffective disorder.  Atrial Fibrillation: RVR has resolved Echo shows EF 55-60%, mild LVH, mild aortic root dilation, otherwise normal. Has remained hemodynamically stable with improved heart rates since diltiazem was stopped (following his episodes of hypotension/bradycardia).  No indication for long-term anticoagulation.  Additionally, feel that patient is not reliable to take medications properly. -Cardiology is following, appreciate recommendations - May need low dose metoprolol for rate when ambulating  - Medically stable for transfer to inpatient psych  Syncope appears to be possibly vagal episode. CT negative for acute findings.  Only atrial fibrillation on telemetry. Normal echo - continue to monitor.   Schizoaffective Disorder MDD  Psychosis CT head  remarkable for partially empty sella.  I do not believe this is related but an incidental finding.  There were no acute findings to suggest other cause of his AMS.  Believe this is likely from his uncontrolled psychiatric conditions.  Psychiatry is following, they recommend inpatient admission after medical stabilization. Patient now medically stable. .  . No haldol given yesterday.Marland Kitchen Psychiatric [facility]: Risperidone 3 mg daily, Depakote 500 mg 3 times daily. -Psychiatry following, appreciate recommendations and care             -Continue risperidone 1 mg BID              -Continue cogentin 0.5 mg BID             -Continue Depakote 500 mg 3 times daily -Continue 1:1 sitter  -Hydroxyzine 50 mg every 4 hours as needed for anxiety, sleep  Subclinical Hypothyroidism Elevated TSH with normal free T3/T4. -Repeat in 4-6 weeks as outpatient   Prediabetes Hgb A1c 5.9.  FEN/GI: Heart healthy PPx: Lovenox  Disposition: Medically stable for transfer to inpatient psych  Subjective:  Pt states nothing currently bothering him.  Asked why he was here.  States he has a 'bad attitude' that is a '59.6 on a numerical scale'.  Pt then started talking about a 'karen with a k' and 'marsha with a 'c''.   Objective: Temp:  [98.5 F (36.9 C)] 98.5 F (36.9 C) (05/28 1125) Pulse Rate:  [77-116] 77 (05/28 1549) Resp:  [14-16] 16 (05/28 1549) BP: (104-128)/(71-80) 128/80 (05/28 1549) Physical Exam: General: alert. Not oriented.  Cardiovascular: normal rate. Irregularly irregular rhythm.. No murmurs.  Respiratory: LCTAB.  Psych: intermittently coherent. Otherwise pleasant.    Laboratory: Recent Labs  Lab 12/06/20 1705 12/06/20 2321 12/07/20 0100 12/08/20 0019  WBC 6.6  --  8.1 7.3  HGB 15.0 14.6 15.1 14.8  HCT 45.1 43.0 45.3 44.0  PLT 155  --  150 169   Recent Labs  Lab 12/06/20 1705 12/06/20 2321 12/07/20 0100  NA 139 146* 143  K 3.3* 4.1 3.9  CL 109  --  114*  CO2 22  --  21*  BUN 12  --  8  CREATININE 1.06  --  0.87  CALCIUM 8.1*  --  8.5*  PROT  --   --  5.7*  BILITOT  --   --  0.6  ALKPHOS  --   --  44  ALT  --   --  12  AST  --   --  13*  GLUCOSE 150*  --  109*     Imaging/Diagnostic Tests: 1. Left ventricular ejection fraction, by estimation, is 55 to 60%. The  left ventricle has normal function. The left ventricle has no regional  wall motion abnormalities. There is mild left ventricular hypertrophy.  Left ventricular diastolic parameters  were normal.  2. Right ventricular systolic function is normal. The right ventricular   size is normal.  3. The mitral valve is normal in structure. No evidence of mitral valve  regurgitation. No evidence of mitral stenosis.  4. The aortic valve is tricuspid. Aortic valve regurgitation is not  visualized. No aortic stenosis is present.  5. Aortic dilatation noted. There is mild dilatation of the aortic root,  measuring 43 mm.  6. The inferior vena cava is normal in size with greater than 50%  respiratory variability, suggesting right atrial pressure of 3 mmHg.   Sandre Kitty, MD 12/09/2020, 5:49 AM PGY-3, [place] [facility] FPTS Intern pager: [phone], text pages welcome

## 2020-12-09 NOTE — Progress Notes (Signed)
CSW was contacted by [facility] admissions in reference to review for possible placement of this patient. Admission asked to speak with the patient's nurse to inquire if the patient is under IVC and to ask a few other question. CSW provided the patient's nurse with the number to [facility] admissions/Phone#: [phone].  Crissie Reese, MSW, LCSW-A, LCAS-A Phone: [phone] Disposition/TOC

## 2020-12-09 NOTE — Progress Notes (Signed)
Brief cardiology progress note: Echo from 12/08/20 with normal EF, no significant abnormalities. Rates remain generally well controlled, increase with activity but overall within reasonable range.  See signoff note from yesterday. Please contact us as he is nearing discharge from psychiatric admission and we can arrange for outpatient follow up.  Jodelle Red, MD, PhD, [facility]  [facility]  [address], [address] [place], [place] [address] [phone]

## 2020-12-09 NOTE — Progress Notes (Signed)
Per Starla Link, patient meets criteria for inpatient treatment. There are no available or appropriate beds at [facility] today. CSW faxed referrals to the following facilities for review:  [facility]  TTS will continue to seek bed placement.  Crissie Reese, MSW, LCSW-A, LCAS-A Phone: [phone] Disposition/TOC

## 2020-12-09 NOTE — Plan of Care (Signed)

## 2020-12-09 NOTE — Discharge Summary (Addendum)
Family Medicine Teaching [facility] Discharge Summary  Patient name: Anthony Knox Medical record number: [id number] Date of birth: [birth year] Age: 60 y.o. Gender: male Date of Admission: 12/06/2020  Date of Discharge: 12/13/2020 Admitting Physician: Leeroy Bock, DO  Primary Care Provider: Patient, No Pcp Per (Inactive) Consultants: Cards, Psych  Indication for Hospitalization: A. fib with RVR  Discharge Diagnoses/Problem List:  1.  A. fib with RVR-resolved 2.  Schizoaffective disorder 3.  Insomnia  Disposition: [facility] inpatient psych  Discharge Condition: Medically stable for transfer to inpatient psych  Discharge Exam:  General: Pleasant male lying comfortably in bed, NAD Cardiovascular: RRR, no M/R/G Respiratory: Clear to auscultation bilaterally Abdomen: Soft, nondistended, nontender Extremities: Able to move all extremities well, no edema appreciated Psych:+AVH  Brief Hospital Course:  [name] is a 60 y.o. male presenting after a syncopal episode found to have new onset afib with RVR. PMH is significant for Schizoaffective disorder.   New Onset Atrial Fibrillation with RVR  Hypotension Patient presented with new-onset atrial fibrillation. Patient was started on cardizem and heparin gtt. Unfortunately he became hypotensive (50's systolic at lowest) and bradycardic (to 30's lowest) and drip was immediately stopped. Patient evaluated by ICU at this time and felt that patient was stable for floor as pressures improved with cessation of CCB and with normal lactic acid. Cardiology was consulted and recommended holding rate controlling agents and stated patient was not a candidate for long-term anticoagulation or DCCV (given he wouldn't be able to continue taking anti-coagulation following procedure). Echo revealed EF 55-60% without valvular abnormalities. Unclear cause of etiology of atrial fibrillation but patient converted back to NSR spontaneously.   Schizoaffective  Disorder  MDD  Psychosis Patient tangential and delusional during admission. Received Geodon 20 mg x1 in ED. Extensive drug panel collected and negative. Psychiatry was consulted for further management. He was started on Depakote 1000 mg mg BID, risperidone 2mg  BID and cogentin 0.5 mg BID. There was significant improvement after initiation of these medications. Patient was not aggressive. Transferred to inpatient psychiatric facility on 12/13/2020  Subclinical Hypothryoidism TSH elevated at 9.441 with Free T4 0.99. Recommend repeat TSH in 4-6 weeks.      Issues for Follow Up:  1.Repeat TSH 4-6 weeks    Significant Procedures: None  Significant Labs and Imaging:  Recent Labs  Lab 12/08/20 0019 12/10/20 0058 12/13/20 0014  WBC 7.3 6.1 7.2  HGB 14.8 14.4 14.3  HCT 44.0 42.9 43.7  PLT 169 208 251   Recent Labs  Lab 12/06/20 1705 12/06/20 2321 12/07/20 0100 12/11/20 0022 12/12/20 0547 12/13/20 0014  NA 139 146* 143 137 138 136  K 3.3* 4.1 3.9 3.9 4.3 3.9  CL 109  --  114* 105 103 101  CO2 22  --  21* 24 26 27   GLUCOSE 150*  --  109* 100* 101* 104*  BUN 12  --  8 9 7 10   CREATININE 1.06  --  0.87 0.89 0.91 0.99  CALCIUM 8.1*  --  8.5* 8.6* 9.0 8.5*  MG 1.9  --   --   --   --   --   ALKPHOS  --   --  44 53  --   --   AST  --   --  13* 13*  --   --   ALT  --   --  12 12  --   --   ALBUMIN  --   --  3.3* 3.1*  --   --  CT HEAD WO CONTRAST  Result Date: 12/07/2020 CLINICAL DATA:  Delirium. EXAM: CT HEAD WITHOUT CONTRAST TECHNIQUE: Contiguous axial images were obtained from the base of the skull through the vertex without intravenous contrast. COMPARISON:  None. FINDINGS: Brain: No evidence of acute large vascular territory infarction, hemorrhage, hydrocephalus, extra-axial collection or mass lesion/mass effect. Partially empty sella. Vascular: No hyperdense vessel identified. Skull: No acute fracture. Sinuses/Orbits: Visualized sinuses are largely clear. Unremarkable orbits.  Other: No mastoid effusions. IMPRESSION: 1. No evidence of acute intracranial abnormality. 2. Partially empty sella. This finding is nonspecific and may be incidental, but can be seen with idiopathic intracranial hypertension in the correct clinical setting. Electronically Signed   By: Feliberto Harts MD   On: 12/07/2020 19:03   DG Chest Port 1 View  Result Date: 12/06/2020 CLINICAL DATA:  Atrial fibrillation. EXAM: PORTABLE CHEST 1 VIEW COMPARISON:  08/08/2010 FINDINGS: Low lung volumes. Borderline cardiomegaly likely accentuated by technique. No pulmonary edema, focal airspace disease, large pleural effusion or pneumothorax. No acute osseous abnormalities are seen. IMPRESSION: Low lung volumes with borderline cardiomegaly. Electronically Signed   By: Narda Rutherford M.D.   On: 12/06/2020 17:31   ECHOCARDIOGRAM COMPLETE  Result Date: 12/08/2020    ECHOCARDIOGRAM REPORT   Patient Name:   Anthony Knox Date of Exam: 12/08/2020 Medical Rec #:  [id number]     Height:       67.0 in Accession #:    [id number]    Weight:       175.3 lb Date of Birth:  10/29/1960    BSA:          1.912 m Patient Age:    59 years      BP:           104/71 mmHg Patient Gender: M             HR:           83 bpm. Exam Location:  Inpatient Procedure: 2D Echo, Cardiac Doppler, Color Doppler and Intracardiac            Opacification Agent Indications:    Atrial fibrillation  History:        Patient has no prior history of Echocardiogram examinations.                 Signs/Symptoms:Syncope.  Sonographer:    Ross Ludwig RDCS (AE) Referring Phys: [id number] [facility]  Sonographer Comments: Echocardiogram completed on second attempt after patient initially refused test on 12/07/2020 from male technologist. IMPRESSIONS  1. Left ventricular ejection fraction, by estimation, is 55 to 60%. The left ventricle has normal function. The left ventricle has no regional wall motion abnormalities. There is mild left ventricular hypertrophy. Left  ventricular diastolic parameters were normal.  2. Right ventricular systolic function is normal. The right ventricular size is normal.  3. The mitral valve is normal in structure. No evidence of mitral valve regurgitation. No evidence of mitral stenosis.  4. The aortic valve is tricuspid. Aortic valve regurgitation is not visualized. No aortic stenosis is present.  5. Aortic dilatation noted. There is mild dilatation of the aortic root, measuring 43 mm.  6. The inferior vena cava is normal in size with greater than 50% respiratory variability, suggesting right atrial pressure of 3 mmHg. FINDINGS  Left Ventricle: Left ventricular ejection fraction, by estimation, is 55 to 60%. The left ventricle has normal function. The left ventricle has no regional wall motion abnormalities. Definity contrast agent was given  IV to delineate the left ventricular  endocardial borders. The left ventricular internal cavity size was normal in size. There is mild left ventricular hypertrophy. Left ventricular diastolic parameters were normal. Right Ventricle: The right ventricular size is normal.Right ventricular systolic function is normal. Left Atrium: Left atrial size was normal in size. Right Atrium: Right atrial size was normal in size. Pericardium: There is no evidence of pericardial effusion. Mitral Valve: The mitral valve is normal in structure. No evidence of mitral valve regurgitation. No evidence of mitral valve stenosis. Tricuspid Valve: The tricuspid valve is normal in structure. Tricuspid valve regurgitation is trivial. No evidence of tricuspid stenosis. Aortic Valve: The aortic valve is tricuspid. Aortic valve regurgitation is not visualized. No aortic stenosis is present. Aortic valve mean gradient measures 1.0 mmHg. Aortic valve peak gradient measures 2.4 mmHg. Aortic valve area, by VTI measures 4.34 cm. Pulmonic Valve: The pulmonic valve was normal in structure. Pulmonic valve regurgitation is not visualized. No  evidence of pulmonic stenosis. Aorta: Aortic dilatation noted. There is mild dilatation of the aortic root, measuring 43 mm. Venous: The inferior vena cava is normal in size with greater than 50% respiratory variability, suggesting right atrial pressure of 3 mmHg. IAS/Shunts: No atrial level shunt detected by color flow Doppler.  LEFT VENTRICLE PLAX 2D LVIDd:         4.50 cm  Diastology LVIDs:         2.80 cm  LV e' medial:    8.27 cm/s LV PW:         1.60 cm  LV E/e' medial:  6.2 LV IVS:        1.20 cm  LV e' lateral:   9.36 cm/s LVOT diam:     2.30 cm  LV E/e' lateral: 5.5 LV SV:         59 LV SV Index:   31 LVOT Area:     4.15 cm  RIGHT VENTRICLE             IVC RV Basal diam:  2.60 cm     IVC diam: 1.40 cm RV S prime:     12.80 cm/s TAPSE (M-mode): 1.7 cm LEFT ATRIUM           Index       RIGHT ATRIUM          Index LA diam:      3.10 cm 1.62 cm/m  RA Area:     9.85 cm LA Vol (A2C): 31.3 ml 16.37 ml/m RA Volume:   17.50 ml 9.15 ml/m LA Vol (A4C): 31.3 ml 16.37 ml/m  AORTIC VALVE AV Area (Vmax):    4.15 cm AV Area (Vmean):   4.54 cm AV Area (VTI):     4.34 cm AV Vmax:           76.90 cm/s AV Vmean:          52.400 cm/s AV VTI:            0.136 m AV Peak Grad:      2.4 mmHg AV Mean Grad:      1.0 mmHg LVOT Vmax:         76.80 cm/s LVOT Vmean:        57.200 cm/s LVOT VTI:          0.142 m LVOT/AV VTI ratio: 1.04  AORTA Ao Root diam: 3.80 cm Ao Asc diam:  3.60 cm MITRAL VALVE MV Area (PHT): 1.94 cm    SHUNTS  MV Decel Time: 391 msec    Systemic VTI:  0.14 m MV E velocity: 51.10 cm/s  Systemic Diam: 2.30 cm MV A velocity: 61.70 cm/s MV E/A ratio:  0.83 Olga MillersBrian Crenshaw MD Electronically signed by Olga MillersBrian Crenshaw MD Signature Date/Time: 12/08/2020/3:20:29 PM    Final    Results/Tests Pending at Time of Discharge: None  Discharge Medications:  Allergies as of 12/13/2020   No Known Allergies     Medication List    STOP taking these medications   ARIPiprazole ER 400 MG Srer injection Commonly known as:  ABILIFY MAINTENA   carbamazepine 200 MG tablet Commonly known as: TEGRETOL   diphenhydrAMINE 25 mg capsule Commonly known as: BENADRYL   risperiDONE 3 MG tablet Commonly known as: RISPERDAL Replaced by: risperiDONE 2 MG disintegrating tablet   temazepam 30 MG capsule Commonly known as: RESTORIL     TAKE these medications   benztropine 0.5 MG tablet Commonly known as: COGENTIN Take 1 tablet (0.5 mg total) by mouth 2 (two) times daily. For EPS   divalproex 500 MG DR tablet Commonly known as: DEPAKOTE Take 2 tablets (1,000 mg total) by mouth 2 (two) times daily. What changed:   how much to take  when to take this   docusate sodium 100 MG capsule Commonly known as: COLACE Take 100 mg by mouth daily.   hydrOXYzine 50 MG tablet Commonly known as: ATARAX/VISTARIL Take 1 tablet (50 mg total) by mouth every 4 (four) hours as needed for anxiety (Sleep).   polyethylene glycol 17 g packet Commonly known as: MIRALAX / GLYCOLAX Take 17 g by mouth daily.   ramelteon 8 MG tablet Commonly known as: ROZEREM Take 1 tablet (8 mg total) by mouth at bedtime.   risperiDONE 2 MG disintegrating tablet Commonly known as: RISPERDAL M-TABS Take 1 tablet (2 mg total) by mouth 2 (two) times daily. Replaces: risperiDONE 3 MG tablet       Discharge Instructions: Please refer to Patient Instructions section of EMR for full details.  Patient was counseled important signs and symptoms that should prompt return to medical care, changes in medications, dietary instructions, activity restrictions, and follow up appointments.   Follow-Up Appointments: Transferred to inpatient psych hospital  -- Cardiology to set up follow-up for this patient, they are informed.  Arnoldo Lenisagar, Delight Bickle, MD 12/13/2020, 12:04 PM PGY-1, [facility]

## 2020-12-09 NOTE — Progress Notes (Signed)
Patient in room talking to the television, patient is speaking "word salad".

## 2020-12-10 DIAGNOSIS — I480 Paroxysmal atrial fibrillation: Secondary | ICD-10-CM | POA: Diagnosis not present

## 2020-12-10 DIAGNOSIS — F29 Unspecified psychosis not due to a substance or known physiological condition: Secondary | ICD-10-CM | POA: Diagnosis not present

## 2020-12-10 DIAGNOSIS — R55 Syncope and collapse: Secondary | ICD-10-CM | POA: Diagnosis not present

## 2020-12-10 DIAGNOSIS — F250 Schizoaffective disorder, bipolar type: Secondary | ICD-10-CM

## 2020-12-10 LAB — CBC
HCT: 42.9 % (ref 39.0–52.0)
Hemoglobin: 14.4 g/dL (ref 13.0–17.0)
MCH: 29.7 pg (ref 26.0–34.0)
MCHC: 33.6 g/dL (ref 30.0–36.0)
MCV: 88.5 fL (ref 80.0–100.0)
Platelets: 208 10*3/uL (ref 150–400)
RBC: 4.85 MIL/uL (ref 4.22–5.81)
RDW: 14 % (ref 11.5–15.5)
WBC: 6.1 10*3/uL (ref 4.0–10.5)
nRBC: 0 % (ref 0.0–0.2)

## 2020-12-10 MED ORDER — RAMELTEON 8 MG PO TABS
8.0000 mg | ORAL_TABLET | Freq: Every day | ORAL | Status: DC
  Administered 2020-12-10 – 2020-12-12 (×3): 8 mg via ORAL
  Filled 2020-12-10 (×4): qty 1

## 2020-12-10 MED ORDER — RISPERIDONE 2 MG PO TBDP
2.0000 mg | ORAL_TABLET | Freq: Two times a day (BID) | ORAL | Status: DC
  Administered 2020-12-10 – 2020-12-13 (×7): 2 mg via ORAL
  Filled 2020-12-10 (×8): qty 1

## 2020-12-10 NOTE — Progress Notes (Addendum)
Family Medicine Teaching Service Daily Progress Note Intern Pager: [phone]  Patient name: Anthony Knox Medical record number: [id number] Date of birth: 06/30/1961 Age: 60 y.o. Gender: male  Primary Care Provider: Patient, No Pcp Per (Inactive) Consultants: Cardiology (signed off), Psychiatry Code Status: FULL  Pt Overview and Major Events to Date:  5/26: Admitted 5/29: Medically cleared   Assessment and Plan: Anthony Knox a 60 y.o.malepresenting after a syncopal episode found to have new onset afib with RVR. PMH is significant forSchizoaffective disorder.  Schizoaffective disorder  MDD Psychiatry is following, recommend patient for inpatient psychiatric admission.  Patient is medically cleared.  Appreciate LCSW help and finding placement for this patient.  EKG with QTc 458 today.  -Psychiatry following, appreciate recommendations -Continue Depakote 500 mg 3 times daily -Risperdal increased to 2 mg twice daily -Continue Cogentin 0.5 mg twice daily -Continue Hydroxyzine 50 mg q4h PRN anxiety  -F/u Valproic acid level tomorrow  Atrial Fibrillation with RVR: Resolved Cardiology has signed off.  Patient in normal sinus rhythm this morning, heart rate in the 60s.Also confirmed NSR with rate 73 on EKG. -D/c telemetry   Subclinical Hypothyroidism -Repeat TSH in 4-6 weeks outpatient.  Difficulty Sleeping Reported by sitter.  -Start Ramelteon 8 mg nightly  FEN/GI: Heart Healthy PPx: Lovenox   Status is: Inpatient  Remains inpatient appropriate because:Unsafe d/c plan   Dispo:  Patient From: Home  Planned Disposition: Behavioral Health  Medically stable for discharge: No      Subjective:  Patient feels well this morning.  He has no complaints.  Discussed with sitter that was with patient overnight, reports that he did well overnight. Does states that patient didn't want to complete psychiatric assessment yesterday. Also states that patient reports some insomnia  and has difficulty with sleeping at night.   Objective: Temp:  [97.7 F (36.5 C)-98.2 F (36.8 C)] 97.7 F (36.5 C) (05/29 1628) BP: (101-115)/(70-77) 101/70 (05/29 1628) Physical Exam: General: Awake, alert, in no distress, pleasant Cardiovascular: RRR without murmur, HR 60's Respiratory: CTAB without wheezing/rhonchi/rales Abdomen: soft, non-tender to palpation in all quadrants, non-distended, no R/G Extremities: No edema  Laboratory: Recent Labs  Lab 12/07/20 0100 12/08/20 0019 12/10/20 0058  WBC 8.1 7.3 6.1  HGB 15.1 14.8 14.4  HCT 45.3 44.0 42.9  PLT 150 169 208   Recent Labs  Lab 12/06/20 1705 12/06/20 2321 12/07/20 0100  NA 139 146* 143  K 3.3* 4.1 3.9  CL 109  --  114*  CO2 22  --  21*  BUN 12  --  8  CREATININE 1.06  --  0.87  CALCIUM 8.1*  --  8.5*  PROT  --   --  5.7*  BILITOT  --   --  0.6  ALKPHOS  --   --  44  ALT  --   --  12  AST  --   --  13*  GLUCOSE 150*  --  109*     Imaging/Diagnostic Tests: No results found.   Sabino Dick, DO 12/10/2020, 5:54 AM PGY-1, [facility] Family Medicine FPTS Intern pager: [phone], text pages welcome

## 2020-12-10 NOTE — Consult Note (Addendum)
[facility] Face-to-Face Psychiatry Consult   Reason for Consult:  Psychotic with history of Schizoaffective disorder Referring Physician:  Terisa Starr MD Patient Identification: Anthony Knox MRN:  [id number] Principal Diagnosis: Schizoaffective disorder, bipolar type (HCC) Diagnosis:  Principal Problem:   Schizoaffective disorder, bipolar type (HCC) Active Problems:   Atrial fibrillation with RVR (HCC)   Syncope   Atrial fibrillation (HCC)   Subclinical hypothyroidism   Prediabetes   Total Time spent with patient: 30 minutes  Subjective:   Anthony Knox is a 60 y.o. male patient admitted after a syncopal episode found to have new onset afib with RVR. PMH is significant forSchizoaffective disorder.  HPI:  Recruitment consultant present in room.  He reports that he is doing much better. He reports no side effects from restarting his medications. He reports that he no longer see evil, he reports that he was delusional but he is not now. He reports no SI, HI, or AH. When asked if there are special messages in the tv he reports "no I'm not delusional." He reports that he is sleeping and eating well. He has no other concerns at present.   The sitter informed me that the overnight sitter reported patient was agitated and actively hallucinating during the night.   Past Psychiatric History: Schizoaffective Disorder, MDD  Risk to Self:  No Risk to Others:  No Prior Inpatient Therapy:  Yes [facility] 2019 Prior Outpatient Therapy:    Past Medical History:  Past Medical History:  Diagnosis Date  . Schizophrenia (HCC)    No past surgical history on file. Family History: No family history on file. Family Psychiatric  History: Reports none Social History:  Social History   Substance and Sexual Activity  Alcohol Use Not Currently     Social History   Substance and Sexual Activity  Drug Use Never    Social History   Socioeconomic History  . Marital status: Single    Spouse name: Not on file  .  Number of children: Not on file  . Years of education: Not on file  . Highest education level: Not on file  Occupational History  . Not on file  Tobacco Use  . Smoking status: Unknown If Ever Smoked  . Smokeless tobacco: Not on file  Vaping Use  . Vaping Use: Unknown  Substance and Sexual Activity  . Alcohol use: Not Currently  . Drug use: Never  . Sexual activity: Not on file  Other Topics Concern  . Not on file  Social History Narrative  . Not on file   Social Determinants of Health   Financial Resource Strain: Not on file  Food Insecurity: Not on file  Transportation Needs: Not on file  Physical Activity: Not on file  Stress: Not on file  Social Connections: Not on file   Additional Social History:    Allergies:  No Known Allergies  Labs:  Results for orders placed or performed during the hospital encounter of 12/06/20 (from the past 48 hour(s))  CBC     Status: None   Collection Time: 12/10/20 12:58 AM  Result Value Ref Range   WBC 6.1 4.0 - 10.5 K/uL   RBC 4.85 4.22 - 5.81 MIL/uL   Hemoglobin 14.4 13.0 - 17.0 g/dL   HCT 54.2 70.6 - 23.7 %   MCV 88.5 80.0 - 100.0 fL   MCH 29.7 26.0 - 34.0 pg   MCHC 33.6 30.0 - 36.0 g/dL   RDW 62.8 31.5 - 17.6 %  Platelets 208 150 - 400 K/uL   nRBC 0.0 0.0 - 0.2 %    Comment: Performed at [facility], [address], [place], [place] [address]    Current Facility-Administered Medications  Medication Dose Route Frequency Provider Last Rate Last Admin  . acetaminophen (TYLENOL) tablet 650 mg  650 mg Oral Q6H PRN Cora Collum, DO       Or  . acetaminophen (TYLENOL) suppository 650 mg  650 mg Rectal Q6H PRN Idalia Needle, Victoria J, DO      . benztropine (COGENTIN) tablet 0.5 mg  0.5 mg Oral BID Lauro Franklin, MD   0.5 mg at 12/10/20 3790  . divalproex (DEPAKOTE) DR tablet 500 mg  500 mg Oral TID Lauro Franklin, MD   500 mg at 12/10/20 2409  . enoxaparin (LOVENOX) injection 40 mg  40 mg Subcutaneous Daily  Dana Allan, MD   40 mg at 12/10/20 7353  . hydrOXYzine (ATARAX/VISTARIL) tablet 50 mg  50 mg Oral Q4H PRN Idalia Needle, Victoria J, DO      . risperiDONE (RISPERDAL M-TABS) disintegrating tablet 2 mg  2 mg Oral BID Lauro Franklin, MD   2 mg at 12/10/20 2992    Musculoskeletal: Strength & Muscle Tone: within normal limits Gait & Station: in bed during interview Patient leans: N/A            Psychiatric Specialty Exam:  Presentation  General Appearance: Appropriate for Environment  Eye Contact:Good  Speech:Clear and Coherent; Normal Rate  Speech Volume:Normal  Handedness:Right   Mood and Affect  Mood:Euthymic  Affect:Congruent   Thought Process  Thought Processes:Disorganized  Descriptions of Associations:Tangential  Orientation:Partial (oriented to person and place, not time (2021))  Thought Content:Scattered; Tangential  History of Schizophrenia/Schizoaffective disorder:Yes  Duration of Psychotic Symptoms:Greater than six months  Hallucinations:Hallucinations: -- (reports none)  Ideas of Reference:-- (reports none)  Suicidal Thoughts:Suicidal Thoughts: No  Homicidal Thoughts:Homicidal Thoughts: No   Sensorium  Memory:Immediate Fair  Judgment:-- (improved but still poor)  Insight:-- (improved but still shallow)   Executive Functions  Concentration:Fair  Attention Span:Fair  Recall:Fair  Fund of Knowledge:Fair  Language:Fair   Psychomotor Activity  Psychomotor Activity:Psychomotor Activity: Normal   Assets  Assets:Resilience   Sleep  Sleep:Sleep: Fair   Physical Exam: Physical Exam Vitals and nursing note reviewed.  Constitutional:      General: He is not in acute distress.    Appearance: Normal appearance. He is normal weight. He is not ill-appearing or toxic-appearing.  HENT:     Head: Normocephalic and atraumatic.  Cardiovascular:     Rate and Rhythm: Normal rate.  Pulmonary:     Effort: Pulmonary effort is  normal.  Neurological:     Mental Status: He is alert.    Review of Systems  Constitutional: Negative for chills and fever.  Respiratory: Negative for cough and shortness of breath.   Cardiovascular: Negative for chest pain.  Gastrointestinal: Negative for abdominal pain, constipation, diarrhea, nausea and vomiting.  Neurological: Negative for dizziness, weakness and headaches.  Psychiatric/Behavioral: The patient is not nervous/anxious.    Blood pressure 101/70, pulse 77, temperature 97.7 F (36.5 C), temperature source Oral, resp. rate 16, height 5\' 7"  (1.702 m), weight 79.5 kg, SpO2 96 %. Body mass index is 27.45 kg/m.  Treatment Plan Summary: Case discussed with Dr. Daily contact with patient to assess and evaluate symptoms and progress in treatment   He reports that he is no longer having AVH or at least  is not disturbed by it. He might be having some sun downing going on as his reported behavior was not what was seen this morning. Either way will increase his Risperdal and monitor his response.  -Increase Risperdal to 2 mg BID -Continue Depakote 500 mg TID -Continue Cogentin 0.5 BID  -Social Work to continue facilitating inpatient placement   Disposition: Recommend psychiatric Inpatient admission when medically cleared.  Lauro Franklin, MD 12/10/2020 9:56 AM

## 2020-12-10 NOTE — Progress Notes (Signed)
Mobility Specialist: Progress Note   12/10/20 1220  Mobility  Activity Ambulated in hall  Level of Assistance Contact guard assist, steadying assist  Assistive Device None  Distance Ambulated (ft) 260 ft  Mobility Ambulated with assistance in hallway  Mobility Response Tolerated well  Mobility performed by Mobility specialist  $Mobility charge 1 Mobility   Pre-Mobility: 81 HR Post-Mobility: 72 HR, 106/80 (89) BP, 98% SpO2  Pt required contact guard utilizing gait belt during ambulation. Pt c/o feeling dizzy halfway through walk so pt was assisted back to room, VSS. Pt back in bed with NT present in room.   [facility] Chasidy Janak Mobility Specialist Mobility Specialist Phone: [phone]

## 2020-12-11 LAB — URINE DRUGS OF ABUSE SCREEN W ALC, ROUTINE (REF LAB)
Amphetamines, Urine: NEGATIVE ng/mL
Barbiturate, Ur: NEGATIVE ng/mL
Benzodiazepine Quant, Ur: NEGATIVE ng/mL
Cannabinoid Quant, Ur: NEGATIVE ng/mL
Cocaine (Metab.): NEGATIVE ng/mL
Ethanol U, Quan: NEGATIVE %
Methadone Screen, Urine: NEGATIVE ng/mL
Opiate Quant, Ur: NEGATIVE ng/mL
Phencyclidine, Ur: NEGATIVE ng/mL
Propoxyphene, Urine: NEGATIVE ng/mL

## 2020-12-11 LAB — COMPREHENSIVE METABOLIC PANEL
ALT: 12 U/L (ref 0–44)
AST: 13 U/L — ABNORMAL LOW (ref 15–41)
Albumin: 3.1 g/dL — ABNORMAL LOW (ref 3.5–5.0)
Alkaline Phosphatase: 53 U/L (ref 38–126)
Anion gap: 8 (ref 5–15)
BUN: 9 mg/dL (ref 6–20)
CO2: 24 mmol/L (ref 22–32)
Calcium: 8.6 mg/dL — ABNORMAL LOW (ref 8.9–10.3)
Chloride: 105 mmol/L (ref 98–111)
Creatinine, Ser: 0.89 mg/dL (ref 0.61–1.24)
GFR, Estimated: >60 mL/min (ref >60)
Glucose, Bld: 100 mg/dL — ABNORMAL HIGH (ref 70–99)
Potassium: 3.9 mmol/L (ref 3.5–5.1)
Sodium: 137 mmol/L (ref 135–145)
Total Bilirubin: 0.4 mg/dL (ref 0.3–1.2)
Total Protein: 5.6 g/dL — ABNORMAL LOW (ref 6.5–8.1)

## 2020-12-11 LAB — VALPROIC ACID LEVEL: Valproic Acid Lvl: 54 ug/mL (ref 50.0–100.0)

## 2020-12-11 MED ORDER — SENNA 8.6 MG PO TABS
1.0000 | ORAL_TABLET | Freq: Once | ORAL | Status: AC
  Administered 2020-12-11: 8.6 mg via ORAL
  Filled 2020-12-11: qty 1

## 2020-12-11 MED ORDER — POLYETHYLENE GLYCOL 3350 17 G PO PACK
17.0000 g | PACK | Freq: Every day | ORAL | Status: DC
  Administered 2020-12-12 – 2020-12-13 (×2): 17 g via ORAL
  Filled 2020-12-11 (×2): qty 1

## 2020-12-11 NOTE — Plan of Care (Signed)
  Problem: Education: Goal: Knowledge of General Education information will improve Description: Including pain rating scale, medication(s)/side effects and non-pharmacologic comfort measures Outcome: Progressing   Problem: Clinical Measurements: Goal: Ability to maintain clinical measurements within normal limits will improve Outcome: Progressing   Problem: Nutrition: Goal: Adequate nutrition will be maintained Outcome: Progressing   Problem: Pain Managment: Goal: General experience of comfort will improve Outcome: Progressing   Problem: Health Behavior/Discharge Planning: Goal: Ability to safely manage health-related needs after discharge will improve Outcome: Progressing   Problem: Health Behavior/Discharge Planning: Goal: Ability to manage health-related needs will improve Outcome: Progressing

## 2020-12-11 NOTE — Care Management Important Message (Signed)
Important Message  Patient Details  Name: Anthony Knox MRN: [id number] Date of Birth: 1960/08/31   Medicare Important Message Given:  Yes     Dorena Bodo 12/11/2020, 2:53 PM

## 2020-12-11 NOTE — Progress Notes (Signed)
Family Medicine Teaching Service Daily Progress Note Intern Pager: [phone]  Patient name: Anthony Knox Medical record number: [id number] Date of birth: 1961/01/24 Age: 60 y.o. Gender: male  Primary Care Provider: Patient, No Pcp Per (Inactive) Consultants: Cardiology (s/o), Psychiatry Code Status: FULL  Pt Overview and Major Events to Date:  5/26: Admitted 5/29: Medically cleared for inpatient Psych  Assessment and Plan: Anthony Knox a 60 y.o.male who presented after a syncopal episode found to have new onset afib with RVR. PMH is significant forSchizoaffective disorder.  Schizoaffective Disorder  MDD  Appreciate Psychiatry's assistance with this patient. Continues to be recommended for transfer to inpatient psychiatric facility. Valproic acid level WNL. -Psychiatry following, appreciate recommendations  Continue Depakote 500 mg TID  Continue Risperdal 2 mg BID  Continue Cogentin 0.5 mg BID  Continue Hydroxyzine 50 mg q4h PRN anxiety -F/u placement with LCSW -1:1 sitter  Atrial Fibrillation with RVR: Resolved Patient in NSR yesterday. Stable, off telemetry.   Subclinical Hypothyroidism -Repeat TSH in 4-6 weeks.   Difficulty Sleeping -Continue Ramelteon 8 mg nightly   FEN/GI: Heart Healthy PPx: Lovenox  Status is: Inpatient  Remains inpatient appropriate because:Unsafe d/c plan   Dispo:  Patient From: Home  Planned Disposition: Behavioral Health  Medically stable for discharge: No     Subjective:  Patient feels well this morning, denies any complaints.  Able to tell me that he is in [place], states the year is 2019.  States that he was brought in because "I think I had an attack of some sort".  Objective: Temp:  [97.7 F (36.5 C)-98.1 F (36.7 C)] 97.7 F (36.5 C) (05/30 1939) Pulse Rate:  [69-70] 70 (05/30 1939) Resp:  [16-18] 16 (05/30 1939) BP: (97-100)/(71-72) 97/72 (05/30 1939) SpO2:  [99 %-100 %] 99 % (05/30 1939) Physical Exam: General:  Awake, alert, well appearing, pleasant, in no distress Cardiovascular: RRR without murmur Respiratory: CTAB without wheezing/rhonchi/rales Abdomen: soft, non-tender in all quadrants, no rebound/guarding Extremities: no edema, warm and dry   Laboratory: Recent Labs  Lab 12/07/20 0100 12/08/20 0019 12/10/20 0058  WBC 8.1 7.3 6.1  HGB 15.1 14.8 14.4  HCT 45.3 44.0 42.9  PLT 150 169 208   Recent Labs  Lab 12/06/20 1705 12/06/20 2321 12/07/20 0100 12/11/20 0022  NA 139 146* 143 137  K 3.3* 4.1 3.9 3.9  CL 109  --  114* 105  CO2 22  --  21* 24  BUN 12  --  8 9  CREATININE 1.06  --  0.87 0.89  CALCIUM 8.1*  --  8.5* 8.6*  PROT  --   --  5.7* 5.6*  BILITOT  --   --  0.6 0.4  ALKPHOS  --   --  44 53  ALT  --   --  12 12  AST  --   --  13* 13*  GLUCOSE 150*  --  109* 100*   Imaging/Diagnostic Tests: None new.   Sabino Dick, DO 12/11/2020, 6:29 AM PGY-1, [facility] FPTS Intern pager: [phone], text pages welcome

## 2020-12-11 NOTE — Plan of Care (Signed)

## 2020-12-11 NOTE — Consult Note (Signed)
[facility] Face-to-Face Psychiatry Consult   Reason for Consult:  Psychotic with history of Schizoaffective disorder Referring Physician:  Terisa Starr MD Patient Identification: Anthony Knox MRN:  [id number] Principal Diagnosis: Schizoaffective disorder, bipolar type (HCC) Diagnosis:  Principal Problem:   Schizoaffective disorder, bipolar type (HCC) Active Problems:   Atrial fibrillation with RVR (HCC)   Syncope   Atrial fibrillation (HCC)   Subclinical hypothyroidism   Prediabetes   Total Time spent with patient: 20 minutes  Subjective:   Anthony Knox is a 60 y.o. male patient admitted after a syncopal episode found to have new onset afib with RVR. PMH is significant forSchizoaffective disorder.  HPI:  Recruitment consultant present in room.  He reports doing well this morning. He reports that his sleep is ok and his appetite is ok. When discussing what the next steps where in his care and that it would be appropriate for him to go to Inpatient Psych he was agreeable to this. He reports that he thinks he still needs help. When asked he reports no SI, HI, or AVH. When asked if he thinks the tv has special messages for him he avoided answering the question.  Past Psychiatric History: Schizoaffective Disorder, MDD, multiple Hospitalizations  Risk to Self:  No Risk to Others:  No Prior Inpatient Therapy:  Yes [facility] 2019 Prior Outpatient Therapy:    Past Medical History:  Past Medical History:  Diagnosis Date  . Schizophrenia (HCC)    No past surgical history on file. Family History: No family history on file. Family Psychiatric  History: Reports none Social History:  Social History   Substance and Sexual Activity  Alcohol Use Not Currently     Social History   Substance and Sexual Activity  Drug Use Never    Social History   Socioeconomic History  . Marital status: Single    Spouse name: Not on file  . Number of children: Not on file  . Years of education: Not on file  .  Highest education level: Not on file  Occupational History  . Not on file  Tobacco Use  . Smoking status: Unknown If Ever Smoked  . Smokeless tobacco: Not on file  Vaping Use  . Vaping Use: Unknown  Substance and Sexual Activity  . Alcohol use: Not Currently  . Drug use: Never  . Sexual activity: Not on file  Other Topics Concern  . Not on file  Social History Narrative  . Not on file   Social Determinants of Health   Financial Resource Strain: Not on file  Food Insecurity: Not on file  Transportation Needs: Not on file  Physical Activity: Not on file  Stress: Not on file  Social Connections: Not on file   Additional Social History:    Allergies:  No Known Allergies  Labs:  Results for orders placed or performed during the hospital encounter of 12/06/20 (from the past 48 hour(s))  CBC     Status: None   Collection Time: 12/10/20 12:58 AM  Result Value Ref Range   WBC 6.1 4.0 - 10.5 K/uL   RBC 4.85 4.22 - 5.81 MIL/uL   Hemoglobin 14.4 13.0 - 17.0 g/dL   HCT 72.0 94.7 - 09.6 %   MCV 88.5 80.0 - 100.0 fL   MCH 29.7 26.0 - 34.0 pg   MCHC 33.6 30.0 - 36.0 g/dL   RDW 28.3 66.2 - 94.7 %   Platelets 208 150 - 400 K/uL   nRBC 0.0 0.0 - 0.2 %  Comment: Performed at [facility], [address], [place], [place] [address]  Valproic acid level     Status: None   Collection Time: 12/11/20 12:22 AM  Result Value Ref Range   Valproic Acid Lvl 54 50.0 - 100.0 ug/mL    Comment: Performed at [facility], [address], [place], [place] [address]  Comprehensive metabolic panel     Status: Abnormal   Collection Time: 12/11/20 12:22 AM  Result Value Ref Range   Sodium 137 135 - 145 mmol/L   Potassium 3.9 3.5 - 5.1 mmol/L   Chloride 105 98 - 111 mmol/L   CO2 24 22 - 32 mmol/L   Glucose, Bld 100 (H) 70 - 99 mg/dL    Comment: Glucose reference range applies only to samples taken after fasting for at least 8 hours.   BUN 9 6 - 20 mg/dL   Creatinine, Ser 0.38 0.61  - 1.24 mg/dL   Calcium 8.6 (L) 8.9 - 10.3 mg/dL   Total Protein 5.6 (L) 6.5 - 8.1 g/dL   Albumin 3.1 (L) 3.5 - 5.0 g/dL   AST 13 (L) 15 - 41 U/L   ALT 12 0 - 44 U/L   Alkaline Phosphatase 53 38 - 126 U/L   Total Bilirubin 0.4 0.3 - 1.2 mg/dL   GFR, Estimated >88 >28 mL/min    Comment: (NOTE) Calculated using the CKD-EPI Creatinine Equation (2021)    Anion gap 8 5 - 15    Comment: Performed at [facility], [address], [place], [place] [address]    Current Facility-Administered Medications  Medication Dose Route Frequency Provider Last Rate Last Admin  . acetaminophen (TYLENOL) tablet 650 mg  650 mg Oral Q6H PRN Cora Collum, DO       Or  . acetaminophen (TYLENOL) suppository 650 mg  650 mg Rectal Q6H PRN Paige, Victoria J, DO      . benztropine (COGENTIN) tablet 0.5 mg  0.5 mg Oral BID Lauro Franklin, MD   0.5 mg at 12/10/20 2128  . divalproex (DEPAKOTE) DR tablet 500 mg  500 mg Oral TID Lauro Franklin, MD   500 mg at 12/10/20 2128  . enoxaparin (LOVENOX) injection 40 mg  40 mg Subcutaneous Daily Dana Allan, MD   40 mg at 12/10/20 1791  . hydrOXYzine (ATARAX/VISTARIL) tablet 50 mg  50 mg Oral Q4H PRN Cora Collum, DO      . ramelteon (ROZEREM) tablet 8 mg  8 mg Oral QHS Dana Allan, MD   8 mg at 12/10/20 2128  . risperiDONE (RISPERDAL M-TABS) disintegrating tablet 2 mg  2 mg Oral BID Lauro Franklin, MD   2 mg at 12/10/20 2128    Musculoskeletal: Strength & Muscle Tone: within normal limits Gait & Station: normal Patient leans: N/A            Psychiatric Specialty Exam:  Presentation  General Appearance: Appropriate for Environment  Eye Contact:Good  Speech:Clear and Coherent; Normal Rate  Speech Volume:Normal  Handedness:Right   Mood and Affect  Mood:Euthymic  Affect:Congruent   Thought Process  Thought Processes:Disorganized  Descriptions of Associations:Tangential  Orientation:Partial (oriented to  person and place, not time (2021))  Thought Content:Scattered; Tangential  History of Schizophrenia/Schizoaffective disorder:Yes  Duration of Psychotic Symptoms:Greater than six months  Hallucinations:Hallucinations: -- (reports none)  Ideas of Reference:-- (reports none)  Suicidal Thoughts:Suicidal Thoughts: No  Homicidal Thoughts:Homicidal Thoughts: No   Sensorium  Memory:Immediate Fair  Judgment:-- (improved but still poor)  Insight:-- (improved but still shallow)   Executive Functions  Concentration:Fair  Attention Span:Fair  Recall:Fair  Fund of Knowledge:Fair  Language:Fair   Psychomotor Activity  Psychomotor Activity:Psychomotor Activity: Normal   Assets  Assets:Resilience   Sleep  Sleep:Sleep: Fair   Physical Exam: Physical Exam Vitals and nursing note reviewed.  Constitutional:      General: He is not in acute distress.    Appearance: Normal appearance. He is not ill-appearing or toxic-appearing.  HENT:     Head: Normocephalic and atraumatic.  Cardiovascular:     Rate and Rhythm: Normal rate.  Pulmonary:     Effort: Pulmonary effort is normal.  Neurological:     Mental Status: He is alert.    Review of Systems  Constitutional: Negative for chills and fever.  Respiratory: Negative for cough and shortness of breath.   Cardiovascular: Negative for chest pain.  Gastrointestinal: Negative for abdominal pain, constipation, diarrhea, nausea and vomiting.  Neurological: Positive for dizziness (mild ). Negative for weakness and headaches.  Psychiatric/Behavioral: Negative for depression and suicidal ideas.   Blood pressure 98/66, pulse 61, temperature 97.9 F (36.6 C), temperature source Oral, resp. rate 20, height 5\' 7"  (1.702 m), weight 79.5 kg, SpO2 100 %. Body mass index is 27.45 kg/m.  Treatment Plan Summary: Daily contact with patient to assess and evaluate symptoms and progress in treatment  He continues to minimize his symptoms  but he does have enough insight that he still needs help. He is willing to voluntarily go to inpatient and I agree with this.  -Continue Risperdal 2 mg BID -Continue Depakote 500 mg TID -Continue Cogentin 0.5 BID  -Recommend inpatient placement    Disposition: Recommend psychiatric Inpatient admission when medically cleared.  , MD 12/11/2020 8:09 AM

## 2020-12-11 NOTE — Progress Notes (Signed)
Received page from nurse regarding patient constipation.  Indicated has not had bowel movement in 3 days and feeling constipated.  Will start daily miralax and give 1 dose of senna.  Jovita Kussmaul, MD 12/11/2020, 10:06 PM PGY-1, [facility] Family Medicine Service pager [phone]

## 2020-12-11 NOTE — Progress Notes (Signed)
Mobility Specialist: Progress Note   12/11/20 1055  Mobility  Activity Ambulated in hall  Level of Assistance Contact guard assist, steadying assist  Assistive Device None  Distance Ambulated (ft) 340 ft  Mobility Ambulated with assistance in hallway  Mobility Response Tolerated well  Mobility performed by Mobility specialist  $Mobility charge 1 Mobility   Pt c/o feeling a little "woozy" during ambulation as well as some stiffness in his L hip, otherwise asx. Pt said he felt better than yesterday overall. Pt back to bed after walk with NT present in room.   [facility] Velma Hanna Mobility Specialist Mobility Specialist Phone: [phone]

## 2020-12-12 DIAGNOSIS — E038 Other specified hypothyroidism: Secondary | ICD-10-CM

## 2020-12-12 DIAGNOSIS — I4819 Other persistent atrial fibrillation: Secondary | ICD-10-CM

## 2020-12-12 LAB — BASIC METABOLIC PANEL
Anion gap: 9 (ref 5–15)
BUN: 7 mg/dL (ref 6–20)
CO2: 26 mmol/L (ref 22–32)
Calcium: 9 mg/dL (ref 8.9–10.3)
Chloride: 103 mmol/L (ref 98–111)
Creatinine, Ser: 0.91 mg/dL (ref 0.61–1.24)
GFR, Estimated: >60 mL/min (ref >60)
Glucose, Bld: 101 mg/dL — ABNORMAL HIGH (ref 70–99)
Potassium: 4.3 mmol/L (ref 3.5–5.1)
Sodium: 138 mmol/L (ref 135–145)

## 2020-12-12 MED ORDER — DIVALPROEX SODIUM 500 MG PO DR TAB
1500.0000 mg | DELAYED_RELEASE_TABLET | Freq: Once | ORAL | Status: AC
  Administered 2020-12-12: 1500 mg via ORAL
  Filled 2020-12-12: qty 3

## 2020-12-12 MED ORDER — SENNA 8.6 MG PO TABS
1.0000 | ORAL_TABLET | Freq: Every day | ORAL | Status: DC
  Administered 2020-12-12 – 2020-12-13 (×2): 8.6 mg via ORAL
  Filled 2020-12-12: qty 1

## 2020-12-12 MED ORDER — DIVALPROEX SODIUM 500 MG PO DR TAB: 1000.0000 mg | DELAYED_RELEASE_TABLET | Freq: Two times a day (BID) | ORAL | Status: DC

## 2020-12-12 MED ORDER — DIVALPROEX SODIUM 500 MG PO DR TAB
1000.0000 mg | DELAYED_RELEASE_TABLET | Freq: Two times a day (BID) | ORAL | Status: DC
  Administered 2020-12-13: 1000 mg via ORAL
  Filled 2020-12-12 (×2): qty 2

## 2020-12-12 NOTE — Progress Notes (Signed)
Family Medicine Teaching Service Daily Progress Note Intern Pager: [phone]  Patient name: Anthony Knox Medical record number: [id number] Date of birth: June 28, 1961 Age: 60 y.o. Gender: male  Primary Care Provider: Patient, No Pcp Per (Inactive) Consultants: Psych, Cards Code Status: Full  Pt Overview and Major Events to Date:  12/06/2020: Admitted to FPTS 12/09/2020: Medically clear for inpatient psych hospital  Assessment and Plan: Anthony Knox. Anthony Knox is a 60 year old male who presented after syncopal episode found to have new onset A. fib with RVR which is resolved now and waiting for inpatient psych bed.  PMH is significant for schizoaffective disorder.  Schizoaffective disorder Patient is alert, awake but not oriented to self, time or place.  Admits to auditory and visual hallucinations but denies any SI or HI.  Valproic acid levels 54 on 12/11/2020.  Psychiatry is following and they recommend inpatient psych -- Psychiatry following, appreciate recommendations --c/w Depakote 500 mg TID, changed to Depakote 1000 mg BID starting tomorrow AM --c/w Risperdal 2 mg BID --c/w Cogentin 0.5 mg BID --c/w hydroxyzine 50 mg q4h PRN anxiety --1:1 sitter  A. fib with RVR-resolved -- Monitor closely  Insomnia --c/w ramelteon 8 mg QHS  Subclinical hypothyroidism -- Repeat TSH in 4-6 weeks  FEN/GI: Heart healthy PPx: Lovenox   Status is: Inpatient  Remains inpatient appropriate because:Unsafe d/c plan   Dispo:  Patient From: Home  Planned Disposition: Behavioral Health  Medically stable for discharge: No          Subjective:  No acute overnight events. Patient evaluated at bedside this morning.  Unable to state his name, age, place and states that he is hearing and seeing things.  Denies SI or HI.  Denies any complaints  Objective: Temp:  [97.6 F (36.4 C)-98 F (36.7 C)] 98 F (36.7 C) (06/01 0900) Pulse Rate:  [63-67] 67 (06/01 0900) Resp:  [16-18] 18 (06/01 0900) BP:  (90-112)/(64-72) 90/67 (06/01 0900) SpO2:  [98 %-100 %] 100 % (06/01 0900) Physical Exam: General: Pleasant male lying comfortably in bed, NAD Cardiovascular: RRR, no m/r/g Respiratory: Clear to auscultation bilaterally Abdomen: Soft, nontender, nondistended Extremities: No edema appreciated Psych: Responding to internal stimuli, pleasant mood and affect, grandiose  Laboratory: Recent Labs  Lab 12/07/20 0100 12/08/20 0019 12/10/20 0058  WBC 8.1 7.3 6.1  HGB 15.1 14.8 14.4  HCT 45.3 44.0 42.9  PLT 150 169 208   Recent Labs  Lab 12/07/20 0100 12/11/20 0022 12/12/20 0547  NA 143 137 138  K 3.9 3.9 4.3  CL 114* 105 103  CO2 21* 24 26  BUN 8 9 7   CREATININE 0.87 0.89 0.91  CALCIUM 8.5* 8.6* 9.0  PROT 5.7* 5.6*  --   BILITOT 0.6 0.4  --   ALKPHOS 44 53  --   ALT 12 12  --   AST 13* 13*  --   GLUCOSE 109* 100* 101*    Imaging/Diagnostic Tests: No results found.  , MD 12/12/2020, 10:56 AM PGY-1,  [facility] Family Medicine FPTS Intern pager: [phone], text pages welcome

## 2020-12-12 NOTE — Progress Notes (Signed)
Occupational Therapy Treatment Patient Details Name: Anthony Knox MRN: [id number] DOB: 1961/03/20 Today's Date: 12/12/2020    History of present illness 60 yo admittted 5/26 from [facility] after syncope with new AFib with RVR, resultant hypotension and bradycardia after diltiazem. PMhx: schizoaffective disorder   OT comments  Pt making steady progress towards OT goals this session. Pts biggest deficits appears to be cognition as pt presents with impairments in the areas of memory, attention, orientation, problem solving and safety/ judgement. Pt able to complete functional mobility greater than a household distance with no AD with min guard assist while completing cognitive task of naming animals in 1 min ( able to name 12 animals correctly). Pt required cues to problem solve how to return to room. Pt currently requires min guard for UB ADLs and set- up for LB ADLs. Pt would continue to benefit from skilled occupational therapy while admitted and after d/c to address the below listed limitations in order to improve overall functional mobility and facilitate independence with BADL participation. DC plan remains appropriate, will follow acutely per POC.     Follow Up Recommendations  Other (comment);Supervision/Assistance - 24 hour (behavior health inpatient)    Equipment Recommendations  None recommended by OT    Recommendations for Other Services      Precautions / Restrictions Precautions Precautions: Fall;Other (comment) Precaution Comments: watch HR Restrictions Weight Bearing Restrictions: No       Mobility Bed Mobility Overal bed mobility: Modified Independent Bed Mobility: Supine to Sit     Supine to sit: Modified independent (Device/Increase time)     General bed mobility comments: no physical assist needed    Transfers Overall transfer level: Modified independent Equipment used: None Transfers: Sit to/from Stand Sit to Stand: Modified independent  (Device/Increase time)              Balance Overall balance assessment: Needs assistance Sitting-balance support: No upper extremity supported;Feet supported Sitting balance-Leahy Scale: Fair Sitting balance - Comments: sitting EOB with no UE support or LOB to don socks   Standing balance support: No upper extremity supported;During functional activity Standing balance-Leahy Scale: Fair Standing balance comment: standing at sink for ADLs with no LOB, also able to step over obstacles in hallway with no AD or LOB                           ADL either performed or assessed with clinical judgement   ADL Overall ADL's : Needs assistance/impaired     Grooming: Oral care;Standing;Min guard Grooming Details (indicate cue type and reason): standing at sink to complete oral care             Lower Body Dressing: Set up;Sitting/lateral leans Lower Body Dressing Details (indicate cue type and reason): to don socks from EOB Toilet Transfer: Min guard;Ambulation Toilet Transfer Details (indicate cue type and reason): simulated via functional mobility with min guard and no AD         Functional mobility during ADLs: Min guard;Cueing for safety General ADL Comments: cognition continues to be most limiting factor     Vision Baseline Vision/History: Wears glasses Wears Glasses: At all times (doesnt have glasses with him)     Perception     Praxis      Cognition Arousal/Alertness: Awake/alert Behavior During Therapy: WFL for tasks assessed/performed;Restless;Impulsive Overall Cognitive Status: Impaired/Different from baseline Area of Impairment: Attention;Memory;Safety/judgement;Awareness;Problem solving  Orientation Level: Disoriented to;Time (states its Monday) Current Attention Level: Selective Memory: Decreased short-term memory   Safety/Judgement: Decreased awareness of safety Awareness: Emergent Problem Solving: Requires verbal  cues General Comments: pt following all commands during session but presents with impaired attention and STM deficits needing cues to recall how to return to room, pt able to state 12 animals in 1 min but noted to report "contusion" as an animal with no awareness to mistake. easily distracted in hallway        Exercises     Shoulder Instructions       General Comments VSS on RA    Pertinent Vitals/ Pain       Pain Assessment: No/denies pain  Home Living                                          Prior Functioning/Environment              Frequency  Min 2X/week        Progress Toward Goals  OT Goals(current goals can now be found in the care plan section)  Progress towards OT goals: Progressing toward goals  Acute Rehab OT Goals Patient Stated Goal: none stated Time For Goal Achievement: 12/21/20 Potential to Achieve Goals: Good  Plan Discharge plan remains appropriate;Frequency remains appropriate    Co-evaluation                 AM-PAC OT "6 Clicks" Daily Activity     Outcome Measure   Help from another person eating meals?: None Help from another person taking care of personal grooming?: A Little Help from another person toileting, which includes using toliet, bedpan, or urinal?: A Little Help from another person bathing (including washing, rinsing, drying)?: A Little Help from another person to put on and taking off regular upper body clothing?: None Help from another person to put on and taking off regular lower body clothing?: None 6 Click Score: 21    End of Session Equipment Utilized During Treatment: Gait belt  OT Visit Diagnosis: Other abnormalities of gait and mobility (R26.89);Other symptoms and signs involving cognitive function   Activity Tolerance Patient tolerated treatment well   Patient Left in bed;with call bell/phone within reach;with nursing/sitter in room;Other (comment) (sitting EOB)   Nurse Communication  Mobility status;Other (comment) (RN okay'ed pt to have drink)        Time: 6789-3810 OT Time Calculation (min): 18 min  Charges: OT General Charges $OT Visit: 1 Visit OT Treatments $Self Care/Home Management : 8-22 mins  Lenor Derrick., COTA/L Acute Rehabilitation Services [phone]    Barron Schmid 12/12/2020, 12:00 PM

## 2020-12-12 NOTE — Progress Notes (Signed)
Mobility Specialist: Progress Note   12/12/20 1206  Mobility  Activity Ambulated in hall  Level of Assistance Contact guard assist, steadying assist  Assistive Device None  Distance Ambulated (ft) 400 ft  Mobility Ambulated with assistance in hallway  Mobility Response Tolerated well  Mobility performed by Mobility specialist  $Mobility charge 1 Mobility   Post-Mobility: 72 HR, 99% SpO2  Pt asx during ambulation. Pt a little unsteady but improved from previous sessions. Pt back to bed with call bell at his side.   [facility] Vani Gunner Mobility Specialist Mobility Specialist Phone: [phone]

## 2020-12-12 NOTE — Consult Note (Signed)
[facility] Face-to-Face Psychiatry Consult   Reason for Consult:  Schizoaffective disorder, endorsing hallucination Referring Physician:  Terisa Starr, MD Patient Identification: Anthony Knox MRN:  [id number] Principal Diagnosis: Schizoaffective disorder, bipolar type (HCC) Diagnosis:  Principal Problem:   Schizoaffective disorder, bipolar type (HCC) Active Problems:   Atrial fibrillation with RVR (HCC)   Syncope   Atrial fibrillation (HCC)   Subclinical hypothyroidism   Prediabetes   Total Time spent with patient: 20 minutes  Subjective:   Anthony Knox is a 60 y.o. male patient admitted after a syncopal episode found to have new onset afib with RVR. PMH is significant forSchizoaffective disorder.  Marland Kitchen  HPI:  No sitter required for patient at this time and patient is resting in his room quietly with the television on.   Patient reports he is doing ok, but he reports his mood is "depressed." Patient is not sure why he feels depressed and reports he has been feeling this way for a while. Patient reports that his sleep is improving and he is eating well. Patient denies SI and HI but he reports that he is having AH and possibly VH. Patient reports that he is hearing "voices" and reports that they say things like "he don't know his head from the wall." Patient reports that he thinks the voices are getting quieter. Patient reports that he may be having some VH but he is not sure. Patient reports that he see's "people doing stuff that don't make sense and sometimes I question it and they get agitated." Patient is not sure if what he has been seeing is real or not. Patient denies today that he is getting messages from the TV. Patient also reports that he has been feeling a little impulsive lately.   Past Psychiatric History: Schizoaffective Disorder, MDD, multiple Hospitalizations   Risk to Self:   NO Risk to Others:   NO Prior Inpatient Therapy:   YES [facility] 2019  Prior Outpatient Therapy:    N/A  Past Medical History:  Past Medical History:  Diagnosis Date  . Schizophrenia (HCC)    No past surgical history on file. Family History: No family history on file. Family Psychiatric  History: None reported Social History:  Social History   Substance and Sexual Activity  Alcohol Use Not Currently     Social History   Substance and Sexual Activity  Drug Use Never    Social History   Socioeconomic History  . Marital status: Single    Spouse name: Not on file  . Number of children: Not on file  . Years of education: Not on file  . Highest education level: Not on file  Occupational History  . Not on file  Tobacco Use  . Smoking status: Unknown If Ever Smoked  . Smokeless tobacco: Not on file  Vaping Use  . Vaping Use: Unknown  Substance and Sexual Activity  . Alcohol use: Not Currently  . Drug use: Never  . Sexual activity: Not on file  Other Topics Concern  . Not on file  Social History Narrative  . Not on file   Social Determinants of Health   Financial Resource Strain: Not on file  Food Insecurity: Not on file  Transportation Needs: Not on file  Physical Activity: Not on file  Stress: Not on file  Social Connections: Not on file   Additional Social History:    Allergies:  No Known Allergies  Labs:  Results for orders placed or performed during the hospital encounter  of 12/06/20 (from the past 48 hour(s))  Valproic acid level     Status: None   Collection Time: 12/11/20 12:22 AM  Result Value Ref Range   Valproic Acid Lvl 54 50.0 - 100.0 ug/mL    Comment: Performed at [facility], [address], [place], [place] [address]  Comprehensive metabolic panel     Status: Abnormal   Collection Time: 12/11/20 12:22 AM  Result Value Ref Range   Sodium 137 135 - 145 mmol/L   Potassium 3.9 3.5 - 5.1 mmol/L   Chloride 105 98 - 111 mmol/L   CO2 24 22 - 32 mmol/L   Glucose, Bld 100 (H) 70 - 99 mg/dL    Comment: Glucose reference range applies only  to samples taken after fasting for at least 8 hours.   BUN 9 6 - 20 mg/dL   Creatinine, Ser 3.73 0.61 - 1.24 mg/dL   Calcium 8.6 (L) 8.9 - 10.3 mg/dL   Total Protein 5.6 (L) 6.5 - 8.1 g/dL   Albumin 3.1 (L) 3.5 - 5.0 g/dL   AST 13 (L) 15 - 41 U/L   ALT 12 0 - 44 U/L   Alkaline Phosphatase 53 38 - 126 U/L   Total Bilirubin 0.4 0.3 - 1.2 mg/dL   GFR, Estimated >42 >87 mL/min    Comment: (NOTE) Calculated using the CKD-EPI Creatinine Equation (2021)    Anion gap 8 5 - 15    Comment: Performed at [facility], [address] [place], [place] [address]  Basic metabolic panel     Status: Abnormal   Collection Time: 12/12/20  5:47 AM  Result Value Ref Range   Sodium 138 135 - 145 mmol/L   Potassium 4.3 3.5 - 5.1 mmol/L   Chloride 103 98 - 111 mmol/L   CO2 26 22 - 32 mmol/L   Glucose, Bld 101 (H) 70 - 99 mg/dL    Comment: Glucose reference range applies only to samples taken after fasting for at least 8 hours.   BUN 7 6 - 20 mg/dL   Creatinine, Ser 7.26 0.61 - 1.24 mg/dL   Calcium 9.0 8.9 - 20.3 mg/dL   GFR, Estimated >55 >97 mL/min    Comment: (NOTE) Calculated using the CKD-EPI Creatinine Equation (2021)    Anion gap 9 5 - 15    Comment: Performed at [facility], [address], [place], [place] [address]    Current Facility-Administered Medications  Medication Dose Route Frequency Provider Last Rate Last Admin  . acetaminophen (TYLENOL) tablet 650 mg  650 mg Oral Q6H PRN Cora Collum, DO       Or  . acetaminophen (TYLENOL) suppository 650 mg  650 mg Rectal Q6H PRN Idalia Needle, Victoria J, DO      . benztropine (COGENTIN) tablet 0.5 mg  0.5 mg Oral BID Lauro Franklin, MD   0.5 mg at 12/12/20 0843  . [START ON 12/13/2020] divalproex (DEPAKOTE) DR tablet 1,000 mg  1,000 mg Oral BID Eliseo Gum B, MD      . divalproex (DEPAKOTE) DR tablet 1,500 mg  1,500 mg Oral Once Eliseo Gum B, MD      . enoxaparin (LOVENOX) injection 40 mg  40 mg Subcutaneous Daily Dana Allan, MD   40 mg at 12/12/20 0843  . hydrOXYzine (ATARAX/VISTARIL) tablet 50 mg  50 mg Oral Q4H PRN Cora Collum, DO      . polyethylene glycol (MIRALAX / GLYCOLAX) packet  17 g  17 g Oral Daily Jovita Kussmaul, MD   17 g at 12/12/20 0843  . ramelteon (ROZEREM) tablet 8 mg  8 mg Oral QHS Dana Allan, MD   8 mg at 12/11/20 2112  . risperiDONE (RISPERDAL M-TABS) disintegrating tablet 2 mg  2 mg Oral BID Lauro Franklin, MD   2 mg at 12/12/20 0843  . senna (SENOKOT) tablet 8.6 mg  1 tablet Oral Daily Dagar, Geralynn Rile, MD   8.6 mg at 12/12/20 4401    Musculoskeletal: Strength & Muscle Tone: within normal limits Gait & Station: defer patient remains in bed Patient leans: N/A            Psychiatric Specialty Exam:  Presentation  General Appearance: Appropriate for Environment  Eye Contact:Fair  Speech:Clear and Coherent  Speech Volume:Normal  Handedness:Right   Mood and Affect  Mood:Depressed  Affect:Congruent   Thought Process  Thought Processes:Coherent  Descriptions of Associations:Intact  Orientation:Full (Time, Place and Person)  Thought Content:Logical  History of Schizophrenia/Schizoaffective disorder:Yes  Duration of Psychotic Symptoms:Greater than six months  Hallucinations:Hallucinations: Auditory; Visual Description of Auditory Hallucinations: "voices whispering that he don't know his head from the wall and stuff" Description of Visual Hallucinations: "I don't know I see people doing stuff and I question it sometimes and sometimes they get made." patient is not sure if it is reality or not  Ideas of Reference:None  Suicidal Thoughts:Suicidal Thoughts: No  Homicidal Thoughts:Homicidal Thoughts: No   Sensorium  Memory:Immediate Good; Recent Good; Remote Good  Judgment:Fair  Insight:Fair   Executive Functions  Concentration:Fair  Attention Span:Fair  Recall:Fair  Fund of Knowledge:Fair  Language:Good   Psychomotor  Activity  Psychomotor Activity:Psychomotor Activity: Normal   Assets  Assets:Resilience   Sleep  Sleep:Sleep: Fair   Physical Exam: Physical Exam Constitutional:      Appearance: Normal appearance.  HENT:     Head: Normocephalic and atraumatic.  Eyes:     Extraocular Movements: Extraocular movements intact.     Conjunctiva/sclera: Conjunctivae normal.     Comments: Has baseline esotropia in Left eye  Cardiovascular:     Rate and Rhythm: Normal rate.  Pulmonary:     Effort: Pulmonary effort is normal.     Breath sounds: Normal breath sounds.  Abdominal:     General: Abdomen is flat.  Musculoskeletal:        General: Normal range of motion.  Skin:    General: Skin is warm and dry.  Neurological:     General: No focal deficit present.     Mental Status: He is alert.    Review of Systems  Constitutional: Negative for chills and fever.  HENT: Negative for hearing loss.   Eyes: Negative for blurred vision.  Respiratory: Negative for cough and wheezing.   Cardiovascular: Negative for chest pain.  Gastrointestinal: Negative for abdominal pain.  Neurological: Negative for dizziness.  Psychiatric/Behavioral: Negative for suicidal ideas.   Blood pressure 90/67, pulse 67, temperature 98 F (36.7 C), temperature source Oral, resp. rate 18, height 5\' 7"  (1.702 m), weight 79.5 kg, SpO2 100 %. Body mass index is 27.45 kg/m.  Treatment Plan Summary: Recommend and inpatient [facility] bed in the 400 hall. For continued medication management.   Patient continues to endorse AVH as well as depressed mood with some impulsivity.  Will increase patient Depakote for mood stabilization. Will continue current risperdal dose as patient reports that he thinks his AH are getting better. Patient's Depakote level was in therapeutic range at 54.  -  Continue Risperdal 2 mg BID -Start Depakote 1000mg  BID -Continue Cogentin 0.5 BID  -Recommend inpatient placement   Disposition: Recommend  psychiatric Inpatient admission when medically cleared.  PGY-1 Bobbye MortonJai B Lotoya Casella, MD 12/12/2020 10:18 AM

## 2020-12-12 NOTE — TOC Progression Note (Signed)
Transition of Care [facility]) - Progression Note    Patient Details  Name: Anthony Knox MRN: [id number] Date of Birth: [birth year]  Transition of Care [facility]) CM/SW Contact  Ivette Loyal, [place] Phone Number: 12/12/2020, 5:22 PM  Clinical Narrative:    CSW faxed out pt to [facility], CSW is waiting on follow up. Please refer to note from [facility] CSW for pt denial to return. CSW following, upper management consulted.         Expected Discharge Plan and Services                                                 Social Determinants of Health (SDOH) Interventions    Readmission Risk Interventions No flowsheet data found.

## 2020-12-12 NOTE — Plan of Care (Signed)
  Problem: Clinical Measurements: Goal: Ability to maintain clinical measurements within normal limits will improve Outcome: Progressing   Problem: Clinical Measurements: Goal: Diagnostic test results will improve Outcome: Progressing   Problem: Activity: Goal: Risk for activity intolerance will decrease Outcome: Progressing   Problem: Nutrition: Goal: Adequate nutrition will be maintained Outcome: Progressing   Problem: Coping: Goal: Level of anxiety will decrease Outcome: Progressing   Problem: Pain Managment: Goal: General experience of comfort will improve Outcome: Progressing   Problem: Education: Goal: Understanding of medication regimen will improve Outcome: Progressing

## 2020-12-13 ENCOUNTER — Inpatient Hospital Stay (HOSPITAL_COMMUNITY)
Admission: AD | Admit: 2020-12-13 | Discharge: 2021-01-04 | DRG: 885 | Disposition: A | Discharge status: Home / Self Care | Payer: PPO | Source: Intra-hospital | Attending: Behavioral Health | Admitting: Behavioral Health

## 2020-12-13 ENCOUNTER — Encounter (HOSPITAL_COMMUNITY): Payer: Self-pay | Admitting: Behavioral Health

## 2020-12-13 ENCOUNTER — Other Ambulatory Visit: Payer: Self-pay

## 2020-12-13 DIAGNOSIS — F333 Major depressive disorder, recurrent, severe with psychotic symptoms: Secondary | ICD-10-CM | POA: Diagnosis not present

## 2020-12-13 DIAGNOSIS — K5900 Constipation, unspecified: Secondary | ICD-10-CM

## 2020-12-13 DIAGNOSIS — F259 Schizoaffective disorder, unspecified: Secondary | ICD-10-CM | POA: Diagnosis not present

## 2020-12-13 DIAGNOSIS — E039 Hypothyroidism, unspecified: Secondary | ICD-10-CM | POA: Diagnosis present

## 2020-12-13 DIAGNOSIS — Z9114 Patient's other noncompliance with medication regimen: Secondary | ICD-10-CM

## 2020-12-13 DIAGNOSIS — F251 Schizoaffective disorder, depressive type: Secondary | ICD-10-CM | POA: Diagnosis not present

## 2020-12-13 DIAGNOSIS — F419 Anxiety disorder, unspecified: Secondary | ICD-10-CM | POA: Diagnosis not present

## 2020-12-13 DIAGNOSIS — G4700 Insomnia, unspecified: Secondary | ICD-10-CM | POA: Diagnosis not present

## 2020-12-13 DIAGNOSIS — F250 Schizoaffective disorder, bipolar type: Secondary | ICD-10-CM | POA: Diagnosis not present

## 2020-12-13 DIAGNOSIS — F332 Major depressive disorder, recurrent severe without psychotic features: Secondary | ICD-10-CM | POA: Diagnosis present

## 2020-12-13 LAB — BASIC METABOLIC PANEL
Anion gap: 8 (ref 5–15)
BUN: 10 mg/dL (ref 6–20)
CO2: 27 mmol/L (ref 22–32)
Calcium: 8.5 mg/dL — ABNORMAL LOW (ref 8.9–10.3)
Chloride: 101 mmol/L (ref 98–111)
Creatinine, Ser: 0.99 mg/dL (ref 0.61–1.24)
GFR, Estimated: >60 mL/min (ref >60)
Glucose, Bld: 104 mg/dL — ABNORMAL HIGH (ref 70–99)
Potassium: 3.9 mmol/L (ref 3.5–5.1)
Sodium: 136 mmol/L (ref 135–145)

## 2020-12-13 LAB — CBC
HCT: 43.7 % (ref 39.0–52.0)
Hemoglobin: 14.3 g/dL (ref 13.0–17.0)
MCH: 29.6 pg (ref 26.0–34.0)
MCHC: 32.7 g/dL (ref 30.0–36.0)
MCV: 90.5 fL (ref 80.0–100.0)
Platelets: 251 10*3/uL (ref 150–400)
RBC: 4.83 MIL/uL (ref 4.22–5.81)
RDW: 14 % (ref 11.5–15.5)
WBC: 7.2 10*3/uL (ref 4.0–10.5)
nRBC: 0 % (ref 0.0–0.2)

## 2020-12-13 LAB — RESP PANEL BY RT-PCR (FLU A&B, COVID) ARPGX2
Influenza A by PCR: NEGATIVE
Influenza B by PCR: NEGATIVE
SARS Coronavirus 2 by RT PCR: NEGATIVE

## 2020-12-13 LAB — VALPROIC ACID LEVEL: Valproic Acid Lvl: 119 ug/mL — ABNORMAL HIGH (ref 50.0–100.0)

## 2020-12-13 MED ORDER — POLYETHYLENE GLYCOL 3350 17 G PO PACK
17.0000 g | PACK | Freq: Every day | ORAL | Status: DC
  Administered 2020-12-14: 17 g via ORAL
  Filled 2020-12-13 (×2): qty 1

## 2020-12-13 MED ORDER — DIVALPROEX SODIUM 500 MG PO DR TAB: 1500.0000 mg | DELAYED_RELEASE_TABLET | Freq: Once | ORAL | Status: DC

## 2020-12-13 MED ORDER — ALUM & MAG HYDROXIDE-SIMETH 200-200-20 MG/5ML PO SUSP: 30.0000 mL | ORAL | Status: DC | PRN

## 2020-12-13 MED ORDER — RISPERIDONE 2 MG PO TBDP
2.0000 mg | ORAL_TABLET | Freq: Two times a day (BID) | ORAL | Status: DC
  Administered 2020-12-13: 2 mg via ORAL
  Filled 2020-12-13 (×3): qty 1

## 2020-12-13 MED ORDER — DIVALPROEX SODIUM 500 MG PO DR TAB: 1000.0000 mg | DELAYED_RELEASE_TABLET | Freq: Two times a day (BID) | ORAL | Status: DC

## 2020-12-13 MED ORDER — RAMELTEON 8 MG PO TABS
8.0000 mg | ORAL_TABLET | Freq: Every day | ORAL | Status: DC
  Administered 2020-12-13 – 2021-01-03 (×20): 8 mg via ORAL
  Filled 2020-12-13: qty 1
  Filled 2020-12-13: qty 7
  Filled 2020-12-13 (×20): qty 1
  Filled 2020-12-13: qty 7
  Filled 2020-12-13 (×2): qty 1
  Filled 2020-12-13: qty 7
  Filled 2020-12-13 (×2): qty 1

## 2020-12-13 MED ORDER — ACETAMINOPHEN 325 MG PO TABS
650.0000 mg | ORAL_TABLET | Freq: Four times a day (QID) | ORAL | Status: DC | PRN
  Administered 2020-12-18: 650 mg via ORAL
  Filled 2020-12-13: qty 2

## 2020-12-13 MED ORDER — HYDROXYZINE HCL 50 MG PO TABS
50.0000 mg | ORAL_TABLET | ORAL | Status: DC | PRN
  Administered 2020-12-15 – 2020-12-18 (×2): 50 mg via ORAL
  Filled 2020-12-13 (×4): qty 1

## 2020-12-13 MED ORDER — POLYETHYLENE GLYCOL 3350 17 G PO PACK: 17.0000 g | PACK | Freq: Every day | ORAL | 0 refills | Status: DC

## 2020-12-13 MED ORDER — MAGNESIUM HYDROXIDE 400 MG/5ML PO SUSP: 30.0000 mL | Freq: Every day | ORAL | Status: DC | PRN

## 2020-12-13 MED ORDER — BENZTROPINE MESYLATE 0.5 MG PO TABS
0.5000 mg | ORAL_TABLET | Freq: Two times a day (BID) | ORAL | Status: DC
  Administered 2020-12-13 – 2020-12-15 (×5): 0.5 mg via ORAL
  Filled 2020-12-13 (×13): qty 1

## 2020-12-13 MED ORDER — SENNA 8.6 MG PO TABS
1.0000 | ORAL_TABLET | Freq: Every day | ORAL | Status: DC
  Administered 2020-12-14 – 2021-01-04 (×17): 8.6 mg via ORAL
  Filled 2020-12-13 (×20): qty 1
  Filled 2020-12-13: qty 7
  Filled 2020-12-13 (×2): qty 1
  Filled 2020-12-13 (×2): qty 7
  Filled 2020-12-13: qty 1

## 2020-12-13 MED ORDER — RISPERIDONE 2 MG PO TBDP: 2.0000 mg | ORAL_TABLET | Freq: Two times a day (BID) | ORAL | Status: DC

## 2020-12-13 MED ORDER — RAMELTEON 8 MG PO TABS
8.0000 mg | ORAL_TABLET | Freq: Every day | ORAL | Status: DC
Start: 2020-12-13 — End: 2021-01-04

## 2020-12-13 MED ORDER — HYDROXYZINE HCL 25 MG PO TABS: 25.0000 mg | ORAL_TABLET | Freq: Three times a day (TID) | ORAL | Status: DC | PRN

## 2020-12-13 NOTE — Tx Team (Signed)
Initial Treatment Plan 12/13/2020 5:28 PM [name] JTT:[id number]    PATIENT STRESSORS: Health problems Medication change or noncompliance   PATIENT STRENGTHS: Ability for insight Capable of independent living Motivation for treatment/growth Physical Health Supportive family/friends   PATIENT IDENTIFIED PROBLEMS: anxiety  depression  "heart problems"                 DISCHARGE CRITERIA:  Ability to meet basic life and health needs Improved stabilization in mood, thinking, and/or behavior Motivation to continue treatment in a less acute level of care Need for constant or close observation no longer present  PRELIMINARY DISCHARGE PLAN: Attend aftercare/continuing care group Return to previous living arrangement  PATIENT/FAMILY INVOLVEMENT: This treatment plan has been presented to and reviewed with the patient, Anthony Knox.  The patient and family have been given the opportunity to ask questions and make suggestions.  Raylene Miyamoto, RN 12/13/2020, 5:28 PM

## 2020-12-13 NOTE — Consult Note (Signed)
[facility] Face-to-Face Psychiatry Consult   Reason for Consult:  Schizoaffective disorder, endorsing hallucination Referring Physician:  Levert Feinstein, MD Patient Identification: Anthony Knox MRN:  [id number] Principal Diagnosis: Schizoaffective disorder, bipolar type (HCC) Diagnosis:  Principal Problem:   Schizoaffective disorder, bipolar type (HCC) Active Problems:   Atrial fibrillation with RVR (HCC)   Syncope   Atrial fibrillation (HCC)   Subclinical hypothyroidism   Prediabetes   Total Time spent with patient: 20 minutes  Subjective:   Anthony Knox is a 60 y.o. male patient admitted after a syncopal episode found to have new onset afib with RVR. PMH is significant forSchizoaffective disorder.  HPI:   On assessment this AM patient is resting and arouses via verbal stimuli. Patient has a Comptroller again, but sitter reports no behavior issues. RN reports that patient has not had any behavior issues over the past 48 h but he is still having both AVH. RN notes that although there was discussion that patient's brother is his guardian there is no evidence of this and patient's brother is not able to produce legal documentation of this.  Patient reports eating well. Patient reports that he has been resting but he does not believe he is sleeping well and is not sure if he has been having AH or if it is real outside activity he hears. Patient points out that there are construction men happening right outside his window. Patient does endorse that he is having VH and reports that he is still seeing people doing "weird" activities and he tries to interact with them but they ignore him. Patient denies HI and SI. Patient still feels that inpatient psych is the best option for him. He also notes that he continues to feel impulsive.   Past Psychiatric History: Schizoaffective Disorder, MDD,multiple Hospitalizations   Risk to Self:   NO Risk to Others:   NO Prior Inpatient Therapy:   YES [facility] 2019   Prior Outpatient Therapy:   N/A  Past Medical History:  Past Medical History:  Diagnosis Date  . Schizophrenia (HCC)    No past surgical history on file. Family History: No family history on file. Family Psychiatric  History: None reported Social History:  Social History   Substance and Sexual Activity  Alcohol Use Not Currently     Social History   Substance and Sexual Activity  Drug Use Never    Social History   Socioeconomic History  . Marital status: Single    Spouse name: Not on file  . Number of children: Not on file  . Years of education: Not on file  . Highest education level: Not on file  Occupational History  . Not on file  Tobacco Use  . Smoking status: Unknown If Ever Smoked  . Smokeless tobacco: Not on file  Vaping Use  . Vaping Use: Unknown  Substance and Sexual Activity  . Alcohol use: Not Currently  . Drug use: Never  . Sexual activity: Not on file  Other Topics Concern  . Not on file  Social History Narrative  . Not on file   Social Determinants of Health   Financial Resource Strain: Not on file  Food Insecurity: Not on file  Transportation Needs: Not on file  Physical Activity: Not on file  Stress: Not on file  Social Connections: Not on file   Additional Social History:    Allergies:  No Known Allergies  Labs:  Results for orders placed or performed during the hospital encounter of 12/06/20 (from  the past 48 hour(s))  Basic metabolic panel     Status: Abnormal   Collection Time: 12/12/20  5:47 AM  Result Value Ref Range   Sodium 138 135 - 145 mmol/L   Potassium 4.3 3.5 - 5.1 mmol/L   Chloride 103 98 - 111 mmol/L   CO2 26 22 - 32 mmol/L   Glucose, Bld 101 (H) 70 - 99 mg/dL    Comment: Glucose reference range applies only to samples taken after fasting for at least 8 hours.   BUN 7 6 - 20 mg/dL   Creatinine, Ser 1.610.91 0.61 - 1.24 mg/dL   Calcium 9.0 8.9 - 09.610.3 mg/dL   GFR, Estimated >04>60 >54>60 mL/min    Comment:  (NOTE) Calculated using the CKD-EPI Creatinine Equation (2021)    Anion gap 9 5 - 15    Comment: Performed at [facility], [address] [place], [place] [address]  Basic metabolic panel     Status: Abnormal   Collection Time: 12/13/20 12:14 AM  Result Value Ref Range   Sodium 136 135 - 145 mmol/L   Potassium 3.9 3.5 - 5.1 mmol/L   Chloride 101 98 - 111 mmol/L   CO2 27 22 - 32 mmol/L   Glucose, Bld 104 (H) 70 - 99 mg/dL    Comment: Glucose reference range applies only to samples taken after fasting for at least 8 hours.   BUN 10 6 - 20 mg/dL   Creatinine, Ser 9.140.99 0.61 - 1.24 mg/dL   Calcium 8.5 (L) 8.9 - 10.3 mg/dL   GFR, Estimated >78>60 >29>60 mL/min    Comment: (NOTE) Calculated using the CKD-EPI Creatinine Equation (2021)    Anion gap 8 5 - 15    Comment: Performed at [facility], [address] [place], [place] [address]  CBC     Status: None   Collection Time: 12/13/20 12:14 AM  Result Value Ref Range   WBC 7.2 4.0 - 10.5 K/uL   RBC 4.83 4.22 - 5.81 MIL/uL   Hemoglobin 14.3 13.0 - 17.0 g/dL   HCT 08.643.7 57.839.0 - 46.952.0 %   MCV 90.5 80.0 - 100.0 fL   MCH 29.6 26.0 - 34.0 pg   MCHC 32.7 30.0 - 36.0 g/dL   RDW 62.914.0 52.811.5 - 41.315.5 %   Platelets 251 150 - 400 K/uL   nRBC 0.0 0.0 - 0.2 %    Comment: Performed at [facility], [address] [place], [place] [address]  Valproic acid level     Status: Abnormal   Collection Time: 12/13/20  8:20 AM  Result Value Ref Range   Valproic Acid Lvl 119 (H) 50.0 - 100.0 ug/mL    Comment: Performed at [facility], [address] [place], [place] [address]    Current Facility-Administered Medications  Medication Dose Route Frequency Provider Last Rate Last Admin  . acetaminophen (TYLENOL) tablet 650 mg  650 mg Oral Q6H PRN Cora CollumPaige, Victoria J, DO       Or  . acetaminophen (TYLENOL) suppository 650 mg  650 mg Rectal Q6H PRN Paige, Victoria J, DO      . benztropine (COGENTIN) tablet 0.5 mg  0.5 mg Oral BID Lauro FranklinPashayan,  Alexander S, MD   0.5 mg at 12/13/20 1010  . divalproex (DEPAKOTE) DR tablet 1,000 mg  1,000 mg Oral BID Eliseo GumMcQuilla, Jo Cerone B, MD   1,000 mg at 12/13/20 0802  . enoxaparin (LOVENOX) injection 40 mg  40 mg  Subcutaneous Daily Dana Allan, MD   40 mg at 12/13/20 1009  . hydrOXYzine (ATARAX/VISTARIL) tablet 50 mg  50 mg Oral Q4H PRN Paige, Victoria J, DO      . polyethylene glycol (MIRALAX / GLYCOLAX) packet 17 g  17 g Oral Daily Jovita Kussmaul, MD   17 g at 12/13/20 1009  . ramelteon (ROZEREM) tablet 8 mg  8 mg Oral QHS Dana Allan, MD   8 mg at 12/12/20 2128  . risperiDONE (RISPERDAL M-TABS) disintegrating tablet 2 mg  2 mg Oral BID Lauro Franklin, MD   2 mg at 12/13/20 1010  . senna (SENOKOT) tablet 8.6 mg  1 tablet Oral Daily Dagar, Anjali, MD   8.6 mg at 12/13/20 1010    Musculoskeletal: Strength & Muscle Tone: within normal limits Gait & Station: defer patient remains in bed\ Patient leans: N/A            Psychiatric Specialty Exam:  Presentation  General Appearance: Appropriate for Environment  Eye Contact:Good  Speech:Clear and Coherent  Speech Volume:Normal  Handedness:Right   Mood and Affect  Mood:Euthymic  Affect:Congruent   Thought Process  Thought Processes:Goal Directed (a bit more confused this AM but does have some goal when he communicates his thoughts)  Descriptions of Associations:Circumstantial  Orientation:Partial  Thought Content:Logical  History of Schizophrenia/Schizoaffective disorder:Yes  Duration of Psychotic Symptoms:Greater than six months  Hallucinations:Hallucinations: Auditory; Visual (patient was not sure about AH but was adamant about VH. RN noted patient had been having both) Description of Auditory Hallucinations: "voices whispering that he don't know his head from the wall and stuff" Description of Visual Hallucinations: seeing people doing things and I try to talk to them but they don't answer.  Ideas of  Reference:None  Suicidal Thoughts:Suicidal Thoughts: No  Homicidal Thoughts:Homicidal Thoughts: No   Sensorium  Memory:Immediate Fair; Recent Fair; Remote Fair  Judgment:Fair  Insight:Shallow   Executive Functions  Concentration:Poor  Attention Span:Poor  Recall:Fair  Fund of Knowledge:Fair  Language:Fair   Psychomotor Activity  Psychomotor Activity:Psychomotor Activity: Normal   Assets  Assets:Desire for Improvement   Sleep  Sleep:Sleep: Fair   Physical Exam: Physical Exam Constitutional:      Appearance: Normal appearance.  HENT:     Head: Normocephalic and atraumatic.  Eyes:     Extraocular Movements: Extraocular movements intact.     Conjunctiva/sclera: Conjunctivae normal.     Comments: dysconjugate gaze of the Left eye  Cardiovascular:     Rate and Rhythm: Normal rate.  Pulmonary:     Effort: Pulmonary effort is normal.     Breath sounds: Normal breath sounds.  Abdominal:     General: Abdomen is flat.  Musculoskeletal:        General: Normal range of motion.  Skin:    General: Skin is warm and dry.  Neurological:     General: No focal deficit present.     Mental Status: He is alert.    Review of Systems  Constitutional: Negative for chills and fever.  HENT: Negative for hearing loss.   Eyes: Negative for blurred vision.  Respiratory: Negative for cough and wheezing.   Cardiovascular: Negative for chest pain.  Gastrointestinal: Negative for abdominal pain.  Neurological: Negative for dizziness.  Psychiatric/Behavioral: Negative for suicidal ideas.       Confused about what is real and what is not   Blood pressure 97/66, pulse 82, temperature (!) 97.5 F (36.4 C), temperature source Oral, resp. rate 16, height 5\' 7"  (1.702 m),  weight 79.5 kg, SpO2 100 %. Body mass index is 27.45 kg/m.  Treatment Plan Summary: Patient is medically stable and has a bed pending at [facility]. Schizoaffective disorder  Patient continues to have AVH that is  endorsed by both patient and staff; however both patient and staff have seen improvement in patient's behavior and report patient is no longer aggressive. Will continue current medication regimen.  -ContinueRisperdal 2 mg BID -Continue Depakote 1000mg  BID -Continue Cogentin 0.5 BID - Psych will place pre- admit order at [facility]  Disposition: Transfer to [facility]  PGY-1 [facility], MD 12/13/2020 10:36 AM

## 2020-12-13 NOTE — Progress Notes (Addendum)
This provider was contacted by Greer Pickerel, [facility] via secure chat "hi there, per MD notes this AM, appears intention was for pt to be on depakote 1000mg  BID, but the NP had ordered 1500mg  x 1? looks like she was re-ordering a one time dose that was given last night"  Patent's chart reviewed by this provider. Patient home medications includes Depakote 1000 mg PO BID. Current order is Depakote 1500 mg PO once at HS. Valproic level on 12/13/20 is 119. Valproic repeat ordered for 12/14/20. This provider d/c'd the one time order for Depakote 1500 mg PO once tonight, pending repeat valproic level result scheduled for 12/14/20.

## 2020-12-13 NOTE — Progress Notes (Signed)
Patient ID: Anthony Knox, male   DOB: [birth year], 60 y.o.   MRN: [id number] Admission Note  Pt is a 60 yo male that presents voluntarily from [facility] with worsening AVH. Upon approach, pt denies avh and si/hi. Pt is unsure as to why they are here. Pt is unsure as to if they have been to [facility] before. Review of the EMR states they have. Pt denies any drug/alcohol/tobacco/Rx abuse. Pt states they take medications but they are unsure as to what they are. Pt states they started feeling bad before coming to the hospital so they stopped their medications. Pt denies past/present verbal/physical/sexual abuse. Pt denies self neglect but this Probation officer is concerned. Pt states they live alone and their brother is their only support system. Pt safe on the unit. q65msafety checks  implemented and continued. Consents signed, handbook detailing the patient's rights, responsibilities, and visitor guidelines provided. Skin/belongings search completed and patient oriented to unit. Patient stable at this time. Patient given the opportunity to express concerns and ask questions. Patient given toiletries. Will continue to monitor.   [facility]  TPriscella [facility]present[facility]. Clinician met with patient along with provider (Thomes Lolling NP). Attempted to assess patient. [facility].  Clinician continued to ask additional questions to gain insight and clarity on his response, "I'm sick". He was uncooperative and guarded with answering questions. Observantly aggravated/frustrated. However, reported that he has felt sick for "a couple of days... all over". He refused to provide additional information stating, "Your the doctor or nurse so you figure it out".   Clinician attempted to gain further insight about of his mental health concerns. He indicated that he has a  Dx's of depression and has been depressed "all my life". Patient  decided that he did not want to participate in the assessment. Stated, "Are you some kind of lawyer because you are badgering me".  He abruptly got up and left the room stating, "I'm leaving". He refused his TTS assessment. Patient was walked out to the lobby area by this Clinician and provider.   Received a call from from desk staff stating that patient changed his mind and wanted to be seen. Clinician and provider went back to get patient from the lobby area and took him to a room to continue to his assessment. He consented for his brother to sit in on the assessment.   Patient's brother, DTiffany Knox##[phone] that patient was released from prison Monday 5/23 after serving a 22 month sentence. Says that TShemuelwas diagnosed with Schizophrenia 25 yrs ago.  The brother doesn't know if patient's mental health needs were treated during his time in prison. Patient lives alone. The brother goal is:  "I want TLenixto go somewhere so they can make him take his medications". No SI, HI, and/or AVH's reported by brother.   Clinician attempted to continue the TTS assessment and observed patient suddenly slump over, gray tint to skin, and unable to respond to questions when asked, discontinued making eye contact, and patient's breathing became shallow. Provider present and intervened.   Clinician immediately notified [facility] ([facility]) of the change and called 911 for EMS assistance. [facility] that patient was in route.

## 2020-12-13 NOTE — Progress Notes (Addendum)
FPTS Interim Progress Note  Patient sleeping and resting comfortably.  Rounded with primary RN.  No concerns voiced.  No orders required.  Appreciated nightly round.  Today's Vitals   12/12/20 0715 12/12/20 0900 12/12/20 1936 12/12/20 2300  BP:  90/67 99/61   Pulse:  67 84   Resp:  18 18   Temp:  98 F (36.7 C) 97.7 F (36.5 C)   TempSrc:  Oral Oral   SpO2:  100% 100%   Weight:      Height:      PainSc: 0-No pain  0-No pain 0-No pain    Dana Allan, MD Family Medicine Residency

## 2020-12-13 NOTE — Progress Notes (Signed)
Mobility Specialist: Progress Note   12/13/20 1312  Mobility  Activity Ambulated in hall  Level of Assistance Contact guard assist, steadying assist  Assistive Device None  Distance Ambulated (ft) 400 ft  Mobility Ambulated with assistance in hallway  Mobility Response Tolerated well  Mobility performed by Mobility specialist  $Mobility charge 1 Mobility   Pre-Mobility: 88 HR, 97% SpO2 Post-Mobility: 74 HR, 97%SpO2  Pt asx during ambulation. Pt back to bed after walk per request.   Cristal Deer Marise Knapper Mobility Specialist Mobility Specialist Phone: [phone]

## 2020-12-13 NOTE — TOC Transition Note (Addendum)
Transition of Care [facility]) - CM/SW Discharge Note   Patient Details  Name: Anthony Knox MRN: [id number] Date of Birth: 12-21-1960  Transition of Care [facility]) CM/SW Contact:  Lynett Grimes Phone Number: 12/13/2020, 10:16 AM   Clinical Narrative:    Patient will DC to: [facility] Anticipated DC date: 12/13/20 Transport by: Safe Transport   Per MD patient ready for [facility] room 403-1 . RN to call report prior to discharge [phone]). RN, patient, patient's family, and facility notified of DC. Discharge Summary and FL2 sent to facility. DC packet on chart. Safe transport requested for patient.  CSW will sign off for now as social work intervention is no longer needed. Please consult [place] again if new needs arise.            Patient Goals and CMS Choice        Discharge Placement                       Discharge Plan and Services                                     Social Determinants of Health (SDOH) Interventions     Readmission Risk Interventions No flowsheet data found.

## 2020-12-14 DIAGNOSIS — F250 Schizoaffective disorder, bipolar type: Secondary | ICD-10-CM

## 2020-12-14 LAB — VALPROIC ACID LEVEL: Valproic Acid Lvl: 89 ug/mL (ref 50.0–100.0)

## 2020-12-14 MED ORDER — POLYETHYLENE GLYCOL 3350 17 G PO PACK: 17.0000 g | PACK | Freq: Every day | ORAL | Status: DC | PRN

## 2020-12-14 MED ORDER — RISPERIDONE 3 MG PO TBDP
3.0000 mg | ORAL_TABLET | Freq: Two times a day (BID) | ORAL | Status: DC
  Administered 2020-12-14 – 2020-12-15 (×4): 3 mg via ORAL
  Filled 2020-12-14 (×11): qty 1

## 2020-12-14 MED ORDER — DIVALPROEX SODIUM 500 MG PO DR TAB
1000.0000 mg | DELAYED_RELEASE_TABLET | Freq: Two times a day (BID) | ORAL | Status: DC
  Administered 2020-12-14 – 2020-12-15 (×4): 1000 mg via ORAL
  Filled 2020-12-14 (×9): qty 2

## 2020-12-14 NOTE — H&P (Signed)
Psychiatric Admission Assessment Adult  Patient Identification: Anthony Knox MRN:  [id number] Date of Evaluation:  12/14/2020 Chief Complaint:  Schizoaffective disorder (HCC) [F25.9] Principal Diagnosis: <principal problem not specified> Diagnosis:  Active Problems:   Schizoaffective disorder (HCC)  History of Present Illness: Patient is seen and examined.  Patient is a 60 year old male with a reported past psychiatric history significant for schizophrenia who originally presented to the behavioral health center assessment service on 12/07/2020.  The patient initially presented, but the patient stated at that time "I am sick".  He was noted to be irritable and stated he was leaving.  He attempted to exit the building, but returned in a matter of minutes with his brother Anthony Knox.  The patient was noted to be alert, oriented, calm, cooperative and attentive.  He was noted to be clammy and diaphoretic.  The decision was made to transport him to the local emergency department at [facility].  On 12/06/2020 he was seen in the [facility] emergency department.  It was noted that he had been released from a period of incarceration.  He had presented to the behavioral health urgent care center for evaluation, and had a syncopal episode and was transferred to the emergency department.  In the emergency department he was found to be in atrial fibrillation with rapid ventricular response, and been placed on a diltiazem drip.  Unfortunately he became hypotensive.  Consultation to cardiology as well as the psychiatry were made.  Cardiology consultation recommended anticoagulation at least in the short run, and a transthoracic echocardiogram was ordered.  He was also seen by the critical care service.  His medications from a psychiatric perspective were taken from a list of medicines from December 2019.  He had been previously treated with Tegretol, Risperdal and temazepam.  His echocardiogram was read, and there  was no presence of clot.  It was felt that the patient was a poor candidate for rate control as well as anticoagulation.  Psychiatric consultation was obtained on 12/07/2020.  The patient was a poor historian, and was noted to be disorganized.  Review of the electronic medical record revealed that his last psychiatric hospitalization had been in 2019 at our facility.  Recommendations were to start Risperdal, Depakote as well as Cogentin.  He was seen again by the consultation service on 12/09/2020.  It was noted that he had not made much improvement from a psychiatric perspective.  He was felt to be disorganized and tangential.  It was felt that he was still paranoid.  It was recommended to increase his Risperdal but no other medication changes.  It was also recommended to transfer to a psychiatric facility when a bed was available.  He was seen again on consultation on 12/10/2020.  His Risperdal was increased to 2 mg p.o. twice daily, but no other recommendations were made outside of inpatient admission.  Attempts to place the patient in an outside psychiatric facilities were not effective.  On 12/12/2020 recommendations were to increase Depakote 2000 mg p.o. twice daily.  No other changes were made.  He was transferred to our facility on 12/13/2020 for continued treatment.  On evaluation today, the patient is a poor historian.  Very vague.  He denied auditory or visual loose Nations.  He denied suicidal or homicidal ideation.  He stated that he had been released from prison for unknown circumstances 2 to 3 days prior to his admission to the medical hospital.  He stated he did have a home in  the area.  He stated that for some reason his brother had not paid the bills on his living circumstances.  He was unsure what medications he had received in prison.  Review of the electronic medical record revealed his last admission in our facility was on 06/18/2018.  He was hospitalized for 6 days.  His discharge medications at that  time included the long-acting Abilify injection, Cogentin, Tegretol, Benadryl, hydroxyzine, Risperdal 3 mg p.o. twice daily and temazepam.  He was admitted to our hospital for evaluation and stabilization.  His only complaint today is that the MiraLAX is leading to diarrhea.  Associated Signs/Symptoms: Depression Symptoms:  fatigue, anxiety, Duration of Depression Symptoms: No data recorded (Hypo) Manic Symptoms:  Impulsivity, Irritable Mood, Labiality of Mood, Anxiety Symptoms:  Excessive Worry, Psychotic Symptoms:  Paranoia, PTSD Symptoms: Negative Total Time spent with patient: 30 minutes  Past Psychiatric History: He was previously diagnosed with major depression as well as psychotic illness.  His last hospitalization at our facility was in December 2019.  His diagnosis at that time was psychosis.  His discharge medications included the long-acting Abilify injection, Cogentin, Tegretol, hydroxyzine, Risperdal as well as temazepam.  Prior to that he was hospitalized here in 05/07/2018 and discharged on the long-acting paliperidone injection as well as trazodone.  Is the patient at risk to self? No.  Has the patient been a risk to self in the past 6 months? No.  Has the patient been a risk to self within the distant past? No.  Is the patient a risk to others? No.  Has the patient been a risk to others in the past 6 months? No.  Has the patient been a risk to others within the distant past? No.   Prior Inpatient Therapy:   Prior Outpatient Therapy:    Alcohol Screening: 1. How often do you have a drink containing alcohol?: Never 2. How many drinks containing alcohol do you have on a typical day when you are drinking?: 1 or 2 3. How often do you have six or more drinks on one occasion?: Never AUDIT-C Score: 0 4. How often during the last year have you found that you were not able to stop drinking once you had started?: Never 5. How often during the last year have you failed to do what  was normally expected from you because of drinking?: Never 6. How often during the last year have you needed a first drink in the morning to get yourself going after a heavy drinking session?: Never 7. How often during the last year have you had a feeling of guilt of remorse after drinking?: Never 8. How often during the last year have you been unable to remember what happened the night before because you had been drinking?: Never 9. Have you or someone else been injured as a result of your drinking?: No 10. Has a relative or friend or a doctor or another health worker been concerned about your drinking or suggested you cut down?: No Alcohol Use Disorder Identification Test Final Score (AUDIT): 0 Substance Abuse History in the last 12 months:  No. Consequences of Substance Abuse: Negative Previous Psychotropic Medications: Yes  Psychological Evaluations: Yes  Past Medical History:  Past Medical History:  Diagnosis Date  . Schizophrenia (HCC)    History reviewed. No pertinent surgical history. Family History: History reviewed. No pertinent family history. Family Psychiatric  History: Denied family psychiatric history Tobacco Screening: Have you used any form of tobacco in the last 30 days? (  Cigarettes, Smokeless Tobacco, Cigars, and/or Pipes): No Social History:  Social History   Substance and Sexual Activity  Alcohol Use Not Currently     Social History   Substance and Sexual Activity  Drug Use Never    Additional Social History:                           Allergies:  No Known Allergies Lab Results:  Results for orders placed or performed during the hospital encounter of 12/13/20 (from the past 48 hour(s))  Valproic acid level     Status: None   Collection Time: 12/14/20  6:06 AM  Result Value Ref Range   Valproic Acid Lvl 89 50.0 - 100.0 ug/mL    Comment: Performed at [facility], [address] [place], [place] [address]    Blood Alcohol  level:  Lab Results  Component Value Date   [facility] <10 06/18/2018   ETH <10 05/05/2018    Metabolic Disorder Labs:  Lab Results  Component Value Date   HGBA1C 5.9 (H) 12/07/2020   MPG 123 12/07/2020   MPG 122.63 05/08/2018   Lab Results  Component Value Date   PROLACTIN 34.1 (H) 05/08/2018   Lab Results  Component Value Date   CHOL 170 12/07/2020   TRIG 56 12/07/2020   HDL 32 (L) 12/07/2020   CHOLHDL 5.3 12/07/2020   VLDL 11 12/07/2020   LDLCALC 127 (H) 12/07/2020   LDLCALC 87 05/08/2018    Current Medications: Current Facility-Administered Medications  Medication Dose Route Frequency Provider Last Rate Last Admin  . acetaminophen (TYLENOL) tablet 650 mg  650 mg Oral Q6H PRN Eliseo Gum B, MD      . alum & mag hydroxide-simeth (MAALOX/MYLANTA) 200-200-20 MG/5ML suspension 30 mL  30 mL Oral Q4H PRN Eliseo Gum B, MD      . benztropine (COGENTIN) tablet 0.5 mg  0.5 mg Oral BID [name], FNP   0.5 mg at 12/14/20 0809  . divalproex (DEPAKOTE) DR tablet 1,000 mg  1,000 mg Oral Q12H Walid Haig, Marlane Mingle, MD      . hydrOXYzine (ATARAX/VISTARIL) tablet 50 mg  50 mg Oral Q4H PRN Starkes-Perry, Juel Burrow, FNP      . magnesium hydroxide (MILK OF MAGNESIA) suspension 30 mL  30 mL Oral Daily PRN Eliseo Gum B, MD      . polyethylene glycol (MIRALAX / GLYCOLAX) packet 17 g  17 g Oral Daily PRN Antonieta Pert, MD      . ramelteon (ROZEREM) tablet 8 mg  8 mg Oral QHS [name], FNP   8 mg at 12/13/20 2115  . risperiDONE (RISPERDAL M-TABS) disintegrating tablet 3 mg  3 mg Oral BID Antonieta Pert, MD   3 mg at 12/14/20 0809  . senna (SENOKOT) tablet 8.6 mg  1 tablet Oral Daily [name], FNP   8.6 mg at 12/14/20 0809   PTA Medications: Medications Prior to Admission  Medication Sig Dispense Refill Last Dose  . benztropine (COGENTIN) 0.5 MG tablet Take 1 tablet (0.5 mg total) by mouth 2 (two) times daily. For EPS 60 tablet 0   . divalproex  (DEPAKOTE) 500 MG DR tablet Take 2 tablets (1,000 mg total) by mouth 2 (two) times daily.     Marland Kitchen docusate sodium (COLACE) 100 MG capsule Take 100 mg by mouth daily.     . hydrOXYzine (ATARAX/VISTARIL) 50 MG tablet Take 1 tablet (50 mg  total) by mouth every 4 (four) hours as needed for anxiety (Sleep). 30 tablet 0   . polyethylene glycol (MIRALAX / GLYCOLAX) 17 g packet Take 17 g by mouth daily. 14 each 0   . ramelteon (ROZEREM) 8 MG tablet Take 1 tablet (8 mg total) by mouth at bedtime.     . risperiDONE (RISPERDAL M-TABS) 2 MG disintegrating tablet Take 1 tablet (2 mg total) by mouth 2 (two) times daily.       Musculoskeletal: Strength & Muscle Tone: within normal limits Gait & Station: normal Patient leans: N/A            Psychiatric Specialty Exam:  Presentation  General Appearance: Appropriate for Environment  Eye Contact:Fair  Speech:Normal Rate  Speech Volume:Normal  Handedness:Right   Mood and Affect  Mood:Euthymic  Affect:Flat   Thought Process  Thought Processes:Goal Directed  Duration of Psychotic Symptoms: Greater than six months  Past Diagnosis of Schizophrenia or Psychoactive disorder: Yes  Descriptions of Associations:Circumstantial  Orientation:Full (Time, Place and Person)  Thought Content:Paranoid Ideation  Hallucinations:Hallucinations: Auditory Description of Visual Hallucinations: seeing people doing things and I try to talk to them but they don't answer.  Ideas of Reference:Delusions; Paranoia  Suicidal Thoughts:Suicidal Thoughts: No  Homicidal Thoughts:Homicidal Thoughts: No   Sensorium  Memory:Immediate Poor; Recent Poor; Remote Poor  Judgment:Impaired  Insight:Lacking   Executive Functions  Concentration:Fair  Attention Span:Fair  Recall:Poor  Fund of Knowledge:Fair  Language:Good   Psychomotor Activity  Psychomotor Activity:Psychomotor Activity: Normal   Assets  Assets:Desire for Improvement;  Resilience   Sleep  Sleep:Sleep: Fair Number of Hours of Sleep: 5.5    Physical Exam: Physical Exam Vitals and nursing note reviewed.  HENT:     Head: Normocephalic and atraumatic.  Pulmonary:     Effort: Pulmonary effort is normal.  Neurological:     General: No focal deficit present.     Mental Status: He is alert and oriented to person, place, and time.    Review of Systems  Constitutional: Positive for malaise/fatigue.  Cardiovascular: Positive for chest pain.  Neurological: Positive for dizziness, focal weakness and weakness.   Blood pressure 112/77, pulse 63, temperature 98.7 F (37.1 C), temperature source Oral, resp. rate 18, height 5\' 7"  (1.702 m), weight 79.8 kg, SpO2 98 %. Body mass index is 27.57 kg/m.  Treatment Plan Summary: Daily contact with patient to assess and evaluate symptoms and progress in treatment, Medication management and Plan : Patient is seen and examined.  Patient is a 60 year old male with the above-stated past psychiatric history was transferred to our facility for continued treatment.  He will be admitted to the hospital.  He will be integrated in the milieu.  He will be encouraged to attend groups.  Given his previous treatment with the Risperdal 3 mg p.o. twice daily, I have gone on and increase his dosage from 2 mg p.o. twice daily to 3 mg p.o. twice daily.  He will continue with Cogentin 0.5 mg p.o. twice daily for side effects of medication.  For what ever reason his Depakote was not continued on admission, and it looks like the recommendations were to continue it.  I will restart that at 1000 mg p.o. twice daily.  Review of his admission laboratories showed normal liver function enzymes, normal electrolytes including a creatinine of 0.99.  Lipid panel was normal.  CBC was essentially normal.  On admission his acetaminophen was less than 10, Tegretol was less than 2, salicylate was less than 7.  His valproic acid level on 12/14/2020 was 89.   Hemoglobin A1c is 5.9.  His TSH is mildly elevated at 9.441.  His T3 was 2.7 and T4 was 0.99.  We will address that during the course of the hospitalization.  Respiratory panel was negative for influenza A, B and coronavirus.  HIV was negative.  Drug screen was negative.  EKG from 5/30 showed a normal sinus rhythm.  Results from the echocardiogram revealed left ventricular ejection fraction approximately 55 to 60%.  Left ventricle had no wall motion abnormalities.  No clot was noted.  The aortic valve was tricuspid.  Otherwise was essentially normal.  Cardiology did not recommend any medications for rate control or anticoagulation.  His vital signs are stable, he is afebrile.  Pulse oximetry on room air was 98%.  We will need to get collateral information from his brother.  Observation Level/Precautions:  15 minute checks  Laboratory:  Chemistry Profile  Psychotherapy:    Medications:    Consultations:    Discharge Concerns:    Estimated LOS:  Other:     Physician Treatment Plan for Primary Diagnosis: <principal problem not specified> Long Term Goal(s): Improvement in symptoms so as ready for discharge  Short Term Goals: Ability to identify changes in lifestyle to reduce recurrence of condition will improve, Ability to verbalize feelings will improve, Ability to demonstrate self-control will improve, Ability to identify and develop effective coping behaviors will improve, Ability to maintain clinical measurements within normal limits will improve and Compliance with prescribed medications will improve  Physician Treatment Plan for Secondary Diagnosis: Active Problems:   Schizoaffective disorder (HCC)  Long Term Goal(s): Improvement in symptoms so as ready for discharge  Short Term Goals: Ability to identify changes in lifestyle to reduce recurrence of condition will improve, Ability to verbalize feelings will improve, Ability to demonstrate self-control will improve, Ability to identify and  develop effective coping behaviors will improve, Ability to maintain clinical measurements within normal limits will improve and Compliance with prescribed medications will improve  I certify that inpatient services furnished can reasonably be expected to improve the patient's condition.    Antonieta PertGreg Lawson Brietta Manso, MD 6/3/202212:31 PM

## 2020-12-14 NOTE — [facility] Counselor (Signed)
[facility] LCSW Group Therapy Note   Type of Therapy and Topic:  Group Therapy:  Feelings About Hospitalization  Participation Level:  Active   Description of Group This process group involved patients discussing their feelings related to being hospitalized, as well as the benefits they see to being in the hospital.  These feelings and benefits were itemized.  The group then brainstormed specific ways in which they could seek those same benefits when they discharge and return home.  Therapeutic Goals 1. Patient will identify and describe positive and negative feelings related to hospitalization 2. Patient will verbalize benefits of hospitalization to themselves personally 3. Patients will brainstorm together ways they can obtain similar benefits in the outpatient setting, identify barriers to wellness and possible solutions  Summary of Patient Progress:  Anthony Knox states that today he is apprehensive but does not state why.  Anthony Knox accepted the worksheet that were provided and joined in discussion with his peers.   Therapeutic Modalities Cognitive Behavioral Therapy Motivational Interviewing

## 2020-12-14 NOTE — Progress Notes (Signed)
Adult Psychoeducational Group Note  Date:  12/14/2020 Time:  11:39 AM  Group Topic/Focus:  Goals Group:   The focus of this group is to help patients establish daily goals to achieve during treatment and discuss how the patient can incorporate goal setting into their daily lives to aide in recovery.  Participation Level:  Active  Participation Quality:  Appropriate  Affect:  Appropriate  Cognitive:  Appropriate  Insight: Appropriate  Engagement in Group:  Engaged  Modes of Intervention:  Discussion  Additional Comments:  Pt stated his goal for the day is to stay with the program, and keep going.  [name] D 12/14/2020, 11:39 AM

## 2020-12-14 NOTE — Tx Team (Signed)
Interdisciplinary Treatment and Diagnostic Plan Update  12/14/2020 Time of Session:  Anthony Knox MRN: [id number]  Principal Diagnosis: <principal problem not specified>  Secondary Diagnoses: Active Problems:   Schizoaffective disorder ([place])   Current Medications:  Current Facility-Administered Medications  Medication Dose Route Frequency Provider Last Rate Last Admin  . acetaminophen (TYLENOL) tablet 650 mg  650 mg Oral Q6H PRN Damita Dunnings B, MD      . alum & mag hydroxide-simeth (MAALOX/MYLANTA) 200-200-20 MG/5ML suspension 30 mL  30 mL Oral Q4H PRN Damita Dunnings B, MD      . benztropine (COGENTIN) tablet 0.5 mg  0.5 mg Oral BID Suella Broad, FNP   0.5 mg at 12/14/20 0809  . divalproex (DEPAKOTE) DR tablet 1,000 mg  1,000 mg Oral Q12H Sharma Covert, MD   1,000 mg at 12/14/20 1150  . hydrOXYzine (ATARAX/VISTARIL) tablet 50 mg  50 mg Oral Q4H PRN Starkes-Perry, Gayland Curry, FNP      . magnesium hydroxide (MILK OF MAGNESIA) suspension 30 mL  30 mL Oral Daily PRN Damita Dunnings B, MD      . polyethylene glycol (MIRALAX / GLYCOLAX) packet 17 g  17 g Oral Daily PRN Sharma Covert, MD      . ramelteon (ROZEREM) tablet 8 mg  8 mg Oral QHS Suella Broad, FNP   8 mg at 12/13/20 2115  . risperiDONE (RISPERDAL M-TABS) disintegrating tablet 3 mg  3 mg Oral BID Sharma Covert, MD   3 mg at 12/14/20 0809  . senna (SENOKOT) tablet 8.6 mg  1 tablet Oral Daily Suella Broad, FNP   8.6 mg at 12/14/20 0809   PTA Medications: Medications Prior to Admission  Medication Sig Dispense Refill Last Dose  . benztropine (COGENTIN) 0.5 MG tablet Take 1 tablet (0.5 mg total) by mouth 2 (two) times daily. For EPS 60 tablet 0   . divalproex (DEPAKOTE) 500 MG DR tablet Take 2 tablets (1,000 mg total) by mouth 2 (two) times daily.     Marland Kitchen docusate sodium (COLACE) 100 MG capsule Take 100 mg by mouth daily.     . hydrOXYzine (ATARAX/VISTARIL) 50 MG tablet Take 1 tablet (50 mg total)  by mouth every 4 (four) hours as needed for anxiety (Sleep). 30 tablet 0   . polyethylene glycol (MIRALAX / GLYCOLAX) 17 g packet Take 17 g by mouth daily. 14 each 0   . ramelteon (ROZEREM) 8 MG tablet Take 1 tablet (8 mg total) by mouth at bedtime.     . risperiDONE (RISPERDAL M-TABS) 2 MG disintegrating tablet Take 1 tablet (2 mg total) by mouth 2 (two) times daily.       Patient Stressors: Health problems Medication change or noncompliance  Patient Strengths: Ability for insight Capable of independent living Motivation for treatment/growth Physical Health Supportive family/friends  Treatment Modalities: Medication Management, Group therapy, Case management,  1 to 1 session with clinician, Psychoeducation, Recreational therapy.   Physician Treatment Plan for Primary Diagnosis: <principal problem not specified> Long Term Goal(s): Improvement in symptoms so as ready for discharge Improvement in symptoms so as ready for discharge   Short Term Goals: Ability to identify changes in lifestyle to reduce recurrence of condition will improve Ability to verbalize feelings will improve Ability to demonstrate self-control will improve Ability to identify and develop effective coping behaviors will improve Ability to maintain clinical measurements within normal limits will improve Compliance with prescribed medications will improve Ability to identify changes in lifestyle to  reduce recurrence of condition will improve Ability to verbalize feelings will improve Ability to demonstrate self-control will improve Ability to identify and develop effective coping behaviors will improve Ability to maintain clinical measurements within normal limits will improve Compliance with prescribed medications will improve  Medication Management: Evaluate patient's response, side effects, and tolerance of medication regimen.  Therapeutic Interventions: 1 to 1 sessions, Unit Group sessions and Medication  administration.  Evaluation of Outcomes: Not Met  Physician Treatment Plan for Secondary Diagnosis: Active Problems:   Schizoaffective disorder ([place])  Long Term Goal(s): Improvement in symptoms so as ready for discharge Improvement in symptoms so as ready for discharge   Short Term Goals: Ability to identify changes in lifestyle to reduce recurrence of condition will improve Ability to verbalize feelings will improve Ability to demonstrate self-control will improve Ability to identify and develop effective coping behaviors will improve Ability to maintain clinical measurements within normal limits will improve Compliance with prescribed medications will improve Ability to identify changes in lifestyle to reduce recurrence of condition will improve Ability to verbalize feelings will improve Ability to demonstrate self-control will improve Ability to identify and develop effective coping behaviors will improve Ability to maintain clinical measurements within normal limits will improve Compliance with prescribed medications will improve     Medication Management: Evaluate patient's response, side effects, and tolerance of medication regimen.  Therapeutic Interventions: 1 to 1 sessions, Unit Group sessions and Medication administration.  Evaluation of Outcomes: Not Met   RN Treatment Plan for Primary Diagnosis: <principal problem not specified> Long Term Goal(s): Knowledge of disease and therapeutic regimen to maintain health will improve  Short Term Goals: Ability to remain free from injury will improve, Ability to participate in decision making will improve and Ability to verbalize feelings will improve  Medication Management: RN will administer medications as ordered by provider, will assess and evaluate patient's response and provide education to patient for prescribed medication. RN will report any adverse and/or side effects to prescribing provider.  Therapeutic Interventions:  1 on 1 counseling sessions, Psychoeducation, Medication administration, Evaluate responses to treatment, Monitor vital signs and CBGs as ordered, Perform/monitor CIWA, COWS, AIMS and Fall Risk screenings as ordered, Perform wound care treatments as ordered.  Evaluation of Outcomes: Not Met   LCSW Treatment Plan for Primary Diagnosis: <principal problem not specified> Long Term Goal(s): Safe transition to appropriate next level of care at discharge, Engage patient in therapeutic group addressing interpersonal concerns.  Short Term Goals: Engage patient in aftercare planning with referrals and resources, Increase social support and Increase ability to appropriately verbalize feelings  Therapeutic Interventions: Assess for all discharge needs, 1 to 1 time with Social worker, Explore available resources and support systems, Assess for adequacy in community support network, Educate family and significant other(s) on suicide prevention, Complete Psychosocial Assessment, Interpersonal group therapy.  Evaluation of Outcomes: Not Met   Progress in Treatment: Attending groups: Yes. Participating in groups: Yes. Taking medication as prescribed: Yes. Toleration medication: Yes. Family/Significant other contact made: No, will contact:  will obtain consents Patient understands diagnosis: Yes. Discussing patient identified problems/goals with staff: No. Medical problems stabilized or resolved: Yes. Denies suicidal/homicidal ideation: Yes. Issues/concerns per patient self-inventory: No. Other: None  New problem(s) identified: No, Describe:  None  New Short Term/Long Term Goal(s):medication stabilization, elimination of SI thoughts, development of comprehensive mental wellness plan.  Patient Goals:    Discharge Plan or Barriers:  Patient recently admitted. CSW will continue to follow and assess for appropriate referrals and  possible discharge planning.  Reason for Continuation of Hospitalization:  Delusions  Depression Medication stabilization  Estimated Length of Stay: 3-5 days  Attendees: Patient: 12/14/2020   Physician:  12/14/2020   Nursing:  12/14/2020   RN Care Manager: 12/14/2020   Social Worker: Toney Reil, LCSWA 12/14/2020   Recreational Therapist:  12/14/2020   Other:  12/14/2020   Other:  12/14/2020   Other: 12/14/2020      Scribe for Treatment Team: Mliss Fritz, [place] 12/14/2020 2:54 PM

## 2020-12-14 NOTE — Progress Notes (Signed)
Recreation Therapy Notes  Date:  6.3.22 Time: 0930 Location: [address] Dayroom  Group Topic: Stress Management  Goal Area(s) Addresses:  Patient will identify positive stress management techniques. Patient will identify benefits of using stress management post d/c.  Behavioral Response: Attentive  Intervention: Stress Management   Activity: Meditation.  LRT played a meditation that focused on looking at each day as a new opportunity to start your day on a good note.    Education:  Stress Management, Discharge Planning.   Education Outcome: Acknowledges Education  Clinical Observations/Feedback: Pt attended and participated in the activity.  Pt had no questions or concerns.     Summar Mcglothlin, LRT/CTRS         Milana Salay A 12/14/2020 12:20 PM 

## 2020-12-14 NOTE — Progress Notes (Addendum)
   12/14/20 2130  Psych Admission Type (Psych Patients Only)  Admission Status Voluntary  Psychosocial Assessment  Patient Complaints None  Eye Contact Brief  Facial Expression Anxious  Affect Anxious  Speech Tangential  Interaction Minimal;Guarded  Motor Activity Slow  Appearance/Hygiene Disheveled  Behavior Characteristics Cooperative  Mood Anxious;Preoccupied  Thought Process  Coherency Circumstantial  Content WDL  Delusions None reported or observed  Perception WDL  Hallucination None reported or observed  Judgment Poor  Confusion Mild  Danger to Self  Current suicidal ideation? Denies  Danger to Others  Danger to Others None reported or observed   Pt denies SI, HI, AVH and pain other than discomfort in his feet from his corns. Pt denies anxiety and depression. Pt took medications without incident.

## 2020-12-14 NOTE — Progress Notes (Signed)
Adult Psychoeducational Group Note  Date:  12/14/2020 Time:  10:15 PM  Group Topic/Focus:  Wrap-Up Group:   The focus of this group is to help patients review their daily goal of treatment and discuss progress on daily workbooks.  Participation Level:  Active  Participation Quality:  Appropriate  Affect:  Appropriate  Cognitive:  Appropriate  Insight: Appropriate  Engagement in Group:  Engaged  Modes of Intervention:  Discussion  Additional Comments: The patient participated in AA Group on this date with volunteers on how to decrease dependence on relationships while beginning to meet own needs, build confidence, and practice assertiveness--discussed how to demonstrate healthy communication that is honest and self-disclosing. End of Wrap-Up progress report for the AA support group.             Nicoletta Dress 12/14/2020, 10:15 PM

## 2020-12-14 NOTE — BHH Suicide Risk Assessment (Signed)
[facility] Admission Suicide Risk Assessment   Nursing information obtained from:  Patient Demographic factors:  Male,Living alone,Caucasian,Unemployed,Low socioeconomic status Current Mental Status:  NA Loss Factors:  Decline in physical health Historical Factors:  Impulsivity Risk Reduction Factors:  Sense of responsibility to family,Positive social support,Positive therapeutic relationship  Total Time spent with patient: 30 minutes Principal Problem: <principal problem not specified> Diagnosis:  Active Problems:   Schizoaffective disorder (HCC)  Subjective Data: Patient is seen and examined.  Patient is a 60 year old male with a reported past psychiatric history significant for schizophrenia who originally presented to the behavioral health center assessment service on 12/07/2020.  The patient initially presented, but the patient stated at that time "I am sick".  He was noted to be irritable and stated he was leaving.  He attempted to exit the building, but returned in a matter of minutes with his brother Leonard Feigel.  The patient was noted to be alert, oriented, calm, cooperative and attentive.  He was noted to be clammy and diaphoretic.  The decision was made to transport him to the local emergency department at [facility].  On 12/06/2020 he was seen in the [facility] emergency department.  It was noted that he had been released from a period of incarceration.  He had presented to the behavioral health urgent care center for evaluation, and had a syncopal episode and was transferred to the emergency department.  In the emergency department he was found to be in atrial fibrillation with rapid ventricular response, and been placed on a diltiazem drip.  Unfortunately he became hypotensive.  Consultation to cardiology as well as the psychiatry were made.  Cardiology consultation recommended anticoagulation at least in the short run, and a transthoracic echocardiogram was ordered.  He was also seen by  the critical care service.  His medications from a psychiatric perspective were taken from a list of medicines from December 2019.  He had been previously treated with Tegretol, Risperdal and temazepam.  His echocardiogram was read, and there was no presence of clot.  It was felt that the patient was a poor candidate for rate control as well as anticoagulation.  Psychiatric consultation was obtained on 12/07/2020.  The patient was a poor historian, and was noted to be disorganized.  Review of the electronic medical record revealed that his last psychiatric hospitalization had been in 2019 at our facility.  Recommendations were to start Risperdal, Depakote as well as Cogentin.  He was seen again by the consultation service on 12/09/2020.  It was noted that he had not made much improvement from a psychiatric perspective.  He was felt to be disorganized and tangential.  It was felt that he was still paranoid.  It was recommended to increase his Risperdal but no other medication changes.  It was also recommended to transfer to a psychiatric facility when a bed was available.  He was seen again on consultation on 12/10/2020.  His Risperdal was increased to 2 mg p.o. twice daily, but no other recommendations were made outside of inpatient admission.  Attempts to place the patient in an outside psychiatric facilities were not effective.  On 12/12/2020 recommendations were to increase Depakote 2000 mg p.o. twice daily.  No other changes were made.  He was transferred to our facility on 12/13/2020 for continued treatment.  On evaluation today, the patient is a poor historian.  Very vague.  He denied auditory or visual loose [place].  He denied suicidal or homicidal ideation.  He stated that  he had been released from prison for unknown circumstances 2 to 3 days prior to his admission to the medical hospital.  He stated he did have a home in the area.  He stated that for some reason his brother had not paid the bills on his living  circumstances.  He was unsure what medications he had received in prison.  Review of the electronic medical record revealed his last admission in our facility was on 06/18/2018.  He was hospitalized for 6 days.  His discharge medications at that time included the long-acting Abilify injection, Cogentin, Tegretol, Benadryl, hydroxyzine, Risperdal 3 mg p.o. twice daily and temazepam.  He was admitted to our hospital for evaluation and stabilization.  His only complaint today is that the MiraLAX is leading to diarrhea.  Continued Clinical Symptoms:  Alcohol Use Disorder Identification Test Final Score (AUDIT): 0 The "Alcohol Use Disorders Identification Test", Guidelines for Use in Primary Care, Second Edition.  World Science writer [facility]). Score between 0-7:  no or low risk or alcohol related problems. Score between 8-15:  moderate risk of alcohol related problems. Score between 16-19:  high risk of alcohol related problems. Score 20 or above:  warrants further diagnostic evaluation for alcohol dependence and treatment.   CLINICAL FACTORS:   Schizophrenia:   Paranoid or undifferentiated type   Musculoskeletal: Strength & Muscle Tone: within normal limits Gait & Station: normal Patient leans: N/A  Psychiatric Specialty Exam:  Presentation  General Appearance: Appropriate for Environment  Eye Contact:Fair  Speech:Normal Rate  Speech Volume:Normal  Handedness:Right   Mood and Affect  Mood:Euthymic  Affect:Flat   Thought Process  Thought Processes:Goal Directed  Descriptions of Associations:Circumstantial  Orientation:Full (Time, Place and Person)  Thought Content:Paranoid Ideation  History of Schizophrenia/Schizoaffective disorder:Yes  Duration of Psychotic Symptoms:Greater than six months  Hallucinations:Hallucinations: Auditory Description of Visual Hallucinations: seeing people doing things and I try to talk to them but they don't answer.  Ideas of  Reference:Delusions; Paranoia  Suicidal Thoughts:Suicidal Thoughts: No  Homicidal Thoughts:Homicidal Thoughts: No   Sensorium  Memory:Immediate Poor; Recent Poor; Remote Poor  Judgment:Impaired  Insight:Lacking   Executive Functions  Concentration:Fair  Attention Span:Fair  Recall:Poor  Fund of Knowledge:Fair  Language:Good   Psychomotor Activity  Psychomotor Activity:Psychomotor Activity: Normal   Assets  Assets:Desire for Improvement; Resilience   Sleep  Sleep:Sleep: Fair Number of Hours of Sleep: 5.5    Physical Exam: Physical Exam Vitals and nursing note reviewed.    Review of Systems  Gastrointestinal: Positive for diarrhea.  All other systems reviewed and are negative.  Blood pressure 112/77, pulse 63, temperature 98.7 F (37.1 C), temperature source Oral, resp. rate 18, height 5\' 7"  (1.702 m), weight 79.8 kg, SpO2 98 %. Body mass index is 27.57 kg/m.   COGNITIVE FEATURES THAT CONTRIBUTE TO RISK:  Thought constriction (tunnel vision)    SUICIDE RISK:   Minimal: No identifiable suicidal ideation.  Patients presenting with no risk factors but with morbid ruminations; may be classified as minimal risk based on the severity of the depressive symptoms  PLAN OF CARE: Patient is seen and examined.  Patient is a 60 year old male with the above-stated past psychiatric history was transferred to our facility for continued treatment.  He will be admitted to the hospital.  He will be integrated in the milieu.  He will be encouraged to attend groups.  Given his previous treatment with the Risperdal 3 mg p.o. twice daily, I have gone on and increase his dosage from 2 mg  p.o. twice daily to 3 mg p.o. twice daily.  He will continue with Cogentin 0.5 mg p.o. twice daily for side effects of medication.  For what ever reason his Depakote was not continued on admission, and it looks like the recommendations were to continue it.  I will restart that at 1000 mg p.o. twice  daily.  Review of his admission laboratories showed normal liver function enzymes, normal electrolytes including a creatinine of 0.99.  Lipid panel was normal.  CBC was essentially normal.  On admission his acetaminophen was less than 10, Tegretol was less than 2, salicylate was less than 7.  His valproic acid level on 12/14/2020 was 89.  Hemoglobin A1c is 5.9.  His TSH is mildly elevated at 9.441.  His T3 was 2.7 and T4 was 0.99.  We will address that during the course of the hospitalization.  Respiratory panel was negative for influenza A, B and coronavirus.  HIV was negative.  Drug screen was negative.  EKG from 5/30 showed a normal sinus rhythm.  Results from the echocardiogram revealed left ventricular ejection fraction approximately 55 to 60%.  Left ventricle had no wall motion abnormalities.  No clot was noted.  The aortic valve was tricuspid.  Otherwise was essentially normal.  Cardiology did not recommend any medications for rate control or anticoagulation.  His vital signs are stable, he is afebrile.  Pulse oximetry on room air was 98%.  We will need to get collateral information from his brother.  I certify that inpatient services furnished can reasonably be expected to improve the patient's condition.   Antonieta Pert, MD 12/14/2020, 10:37 AM

## 2020-12-14 NOTE — Progress Notes (Signed)
Pt presents with blunted affect, fidgety with irritable mood, intense eye contact and is confused, paranoid and argumentative on initial interactions this shift. Believes it is March 2021 and writer comes across to him as a Clinical research associate rather than a nurse "I don't even know why I'm here, Why do you have to know if I live alone; so you tell me since you are more than a lawyer than a nurse" during assessment this morning. Rates his anxiety 5/10, depression 4/10 and hopelessness 3/10. Reports he slept well last night with good appetite. Appeared calmer, visible in scheduled groups in dayroom after receiving morning dose of Risperdal. Denies SI, HI, VH and pain, endorse +AH when assessed. Off unit to courtyard for activities with peers, returned without issues. Emotional support and encouragement provided to pt this shift. Safety checks maintained at Q 15 minutes intervals along with fall precaution without incident.  All medications given with verbal education and effects monitored.  Pt has beencooperative with care and unit routines with minimal prompts. Tolerates all medications and meals well. Denies concerns at this time. Safety maintained on and off unit.

## 2020-12-14 NOTE — Progress Notes (Signed)
   12/14/20 0000  Psych Admission Type (Psych Patients Only)  Admission Status Voluntary  Psychosocial Assessment  Patient Complaints Anxiety;Depression;Confusion;Insomnia  Eye Contact Fair  Facial Expression Anxious;Pensive  Affect Anxious;Appropriate to circumstance;Depressed;Preoccupied  Speech Logical/coherent  Interaction Assertive  Motor Activity Slow  Appearance/Hygiene Disheveled  Behavior Characteristics Cooperative;Appropriate to situation;Anxious  Mood Depressed;Anxious;Preoccupied;Pleasant  Thought Process  Coherency Disorganized  Content Paranoia  Delusions Paranoid  Perception WDL  Hallucination None reported or observed  Judgment Poor  Confusion Mild  Danger to Self  Current suicidal ideation? Denies  Danger to Others  Danger to Others None reported or observed

## 2020-12-15 DIAGNOSIS — F259 Schizoaffective disorder, unspecified: Secondary | ICD-10-CM
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
## 2020-12-15 NOTE — Progress Notes (Signed)
   12/15/20 2000  Psych Admission Type (Psych Patients Only)  Admission Status Voluntary  Psychosocial Assessment  Patient Complaints Other (Comment) (tiredness probably from being outside for recreation today)  Eye Contact Brief  Facial Expression Anxious  Affect Appropriate to circumstance  Speech Unremarkable  Interaction Minimal;Guarded  Motor Activity Slow  Appearance/Hygiene Unremarkable  Behavior Characteristics Cooperative;Anxious  Mood Anxious  Thought Process  Coherency WDL  Content WDL  Delusions None reported or observed  Perception WDL  Hallucination None reported or observed  Judgment Poor  Confusion Mild  Danger to Self  Current suicidal ideation? Denies  Danger to Others  Danger to Others None reported or observed   Pt seen in room in bed but easy to arouse. Pt denies SI, HI, AVH and pain. Pt says he went outside for recreation and is really tired now. Says the food he ate for dinner was very hot and heated him up so he felt like he almost had a panic attack. Pt not showing any symptoms of that now. Took nighttime meds as scheduled but did not attend group.

## 2020-12-15 NOTE — Progress Notes (Signed)
D:  Patient denies SI/HI/AVH. Patient rated depression 5/10 and anxiety 3/10. Patient participated in group, social worker said that he was more "insightful." A:  Patient took scheduled medicine.  Support and encouragement provided Routine safety checks conducted every 15 minutes. Patient  Informed to notify staff with any concerns.   R:  Safety maintained.
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
## 2020-12-15 NOTE — Progress Notes (Addendum)
[facility] MD Progress Note  12/15/2020 11:13 AM Anthony Knox  MRN:  [id number] Subjective:  " My brother felt I was not taking my Mental health medication and brought me here"  Objective:  Report received from Nursing, Records reviewed and care plan and medications reviewed with members of our interdisciplinary team.  Today patient was met 1:1 in the dayroom for daily evaluation.  He presented alert and oriented x 4.  He stated that he was brought by his brother to the ER for evaluation of his mood as he was not taking his medications.  He was also noted feeling dizzy and was evaluated by the Physician and finally admitted to the medical unit for A-fIB. Once his heart Rhythme stabilized he was brought to [facility] to manage his mental illness.  Patient has hx of Schizophrenia/Schizoaffective disorder.  He was recently discharged from prison and while in there was taking his MH medications.  As he got out patient reported that his brother believed he was not taking his medications.  He is happy he is in the unit to receive care and start back on his medications.  He rated Depression and anxiety 5/10 with 10 being severe depression and anxiety.  He reported good sleep and appetite.  While discharge plan is on going patient stated he does not have light in the home he is supposed to go back.  Patient is participating in all group activities.  He will be able to purchase his medications because he gets Disability check he says.    He denied SI/HI/AVH and no mention of Paranoia.  He is compliant with his medications. Principal Problem: <principal problem not specified> Diagnosis: Active Problems:   Schizoaffective disorder (HCC)  Total Time spent with patient: 25 minutes  Past Psychiatric History:  See H&P note  Past Medical History:  Past Medical History:  Diagnosis Date  . Schizophrenia ([place])    History reviewed. No pertinent surgical history. Family History: History reviewed. No pertinent family  history. Family Psychiatric  History: see H&P note Social History:  Social History   Substance and Sexual Activity  Alcohol Use Not Currently     Social History   Substance and Sexual Activity  Drug Use Never    Social History   Socioeconomic History  . Marital status: Single    Spouse name: Not on file  . Number of children: Not on file  . Years of education: Not on file  . Highest education level: Not on file  Occupational History  . Not on file  Tobacco Use  . Smoking status: Never Smoker  . Smokeless tobacco: Never Used  Vaping Use  . Vaping Use: Never used  Substance and Sexual Activity  . Alcohol use: Not Currently  . Drug use: Never  . Sexual activity: Not Currently  Other Topics Concern  . Not on file  Social History Narrative  . Not on file   Social Determinants of Health   Financial Resource Strain: Not on file  Food Insecurity: Not on file  Transportation Needs: Not on file  Physical Activity: Not on file  Stress: Not on file  Social Connections: Not on file   Additional Social History:                         Sleep: Fair  Appetite:  Fair  Current Medications: Current Facility-Administered Medications  Medication Dose Route Frequency Provider Last Rate Last Admin  . acetaminophen (TYLENOL) tablet 650  mg  650 mg Oral Q6H PRN Damita Dunnings B, MD      . alum & mag hydroxide-simeth (MAALOX/MYLANTA) 200-200-20 MG/5ML suspension 30 mL  30 mL Oral Q4H PRN Damita Dunnings B, MD      . benztropine (COGENTIN) tablet 0.5 mg  0.5 mg Oral BID Suella Broad, FNP   0.5 mg at 12/15/20 0809  . divalproex (DEPAKOTE) DR tablet 1,000 mg  1,000 mg Oral Q12H Sharma Covert, MD   1,000 mg at 12/15/20 0809  . hydrOXYzine (ATARAX/VISTARIL) tablet 50 mg  50 mg Oral Q4H PRN Suella Broad, FNP   50 mg at 12/15/20 0810  . magnesium hydroxide (MILK OF MAGNESIA) suspension 30 mL  30 mL Oral Daily PRN Damita Dunnings B, MD      . polyethylene  glycol (MIRALAX / GLYCOLAX) packet 17 g  17 g Oral Daily PRN Sharma Covert, MD      . ramelteon (ROZEREM) tablet 8 mg  8 mg Oral QHS Suella Broad, FNP   8 mg at 12/14/20 2139  . risperiDONE (RISPERDAL M-TABS) disintegrating tablet 3 mg  3 mg Oral BID Sharma Covert, MD   3 mg at 12/15/20 0809  . senna (SENOKOT) tablet 8.6 mg  1 tablet Oral Daily Suella Broad, FNP   8.6 mg at 12/15/20 1962    Lab Results:  Results for orders placed or performed during the hospital encounter of 12/13/20 (from the past 48 hour(s))  Valproic acid level     Status: None   Collection Time: 12/14/20  6:06 AM  Result Value Ref Range   Valproic Acid Lvl 89 50.0 - 100.0 ug/mL    Comment: Performed at [facility] [place] [address] [place], [place] [address]    Blood Alcohol level:  Lab Results  Component Value Date   ETH <10 06/18/2018   ETH <10 89/21/1941    Metabolic Disorder Labs: Lab Results  Component Value Date   HGBA1C 5.9 (H) 12/07/2020   MPG 123 12/07/2020   MPG 122.63 05/08/2018   Lab Results  Component Value Date   PROLACTIN 34.1 (H) 05/08/2018   Lab Results  Component Value Date   CHOL 170 12/07/2020   TRIG 56 12/07/2020   HDL 32 (L) 12/07/2020   CHOLHDL 5.3 12/07/2020   VLDL 11 12/07/2020   LDLCALC 127 (H) 12/07/2020   LDLCALC 87 05/08/2018    Physical Findings: AIMS: Facial and Oral Movements Muscles of Facial Expression: None, normal Lips and Perioral Area: None, normal Jaw: None, normal Tongue: None, normal,Extremity Movements Upper (arms, wrists, hands, fingers): None, normal Lower (legs, knees, ankles, toes): None, normal, Trunk Movements Neck, shoulders, hips: None, normal, Overall Severity Severity of abnormal movements (highest score from questions above): None, normal Incapacitation due to abnormal movements: None, normal Patient's awareness of abnormal movements (rate only patient's report): No Awareness, Dental  Status Current problems with teeth and/or dentures?: No Does patient usually wear dentures?: No  CIWA:    COWS:     Musculoskeletal: Strength & Muscle Tone: within normal limits Gait & Station: normal Patient leans: Front  Psychiatric Specialty Exam:  Presentation  General Appearance: Appropriate for Environment  Eye Contact:Fair  Speech:Normal Rate  Speech Volume:Normal  Handedness:Right   Mood and Affect  Mood:Euthymic  Affect:Flat   Thought Process  Thought Processes:Goal Directed  Descriptions of Associations:Circumstantial  Orientation:Full (Time, Place and Person)  Thought Content:Paranoid Ideation  History of Schizophrenia/Schizoaffective disorder:Yes  Duration of Psychotic  Symptoms:Greater than six months  Hallucinations:Hallucinations: Auditory  Ideas of Reference:Delusions; Paranoia  Suicidal Thoughts:Suicidal Thoughts: No  Homicidal Thoughts:Homicidal Thoughts: No   Sensorium  Memory:Immediate Poor; Recent Poor; Remote Poor  Judgment:Impaired  Insight:Lacking   Executive Functions  Concentration:Fair  Attention Span:Fair  [place]  Language:Good   Psychomotor Activity  Psychomotor Activity:Psychomotor Activity: Normal   Assets  Assets:Desire for Improvement; Resilience   Sleep  Sleep:Sleep: Fair Number of Hours of Sleep: 5.5    Physical Exam: Physical Exam Vitals and nursing note reviewed.  Constitutional:      Appearance: Normal appearance.  HENT:     Head: Normocephalic.  Cardiovascular:     Rate and Rhythm: Normal rate.  Pulmonary:     Effort: Pulmonary effort is normal.  Musculoskeletal:     Cervical back: Normal range of motion.  Neurological:     Mental Status: He is alert.    Review of Systems  Constitutional: Negative.   HENT: Negative.   Eyes: Negative.   Respiratory: Negative.   Cardiovascular: Negative.   Gastrointestinal: Negative.   Genitourinary: Negative.    Musculoskeletal: Negative.   Skin: Negative.   Neurological: Negative.   Endo/Heme/Allergies: Negative.    Blood pressure 112/77, pulse 63, temperature 98.7 F (37.1 C), temperature source Oral, resp. rate 18, height '5\' 7"'  (1.702 m), weight 79.8 kg, SpO2 98 %. Body mass index is 27.57 kg/m.   Treatment Plan Summary: Daily contact with patient to assess and evaluate symptoms and progress in treatment and Medication management   -Continue Risperidone 3 mg po bid for mood /Mental health -Ramelteon 8 mg po at bed time for sleep -Depakote 1000 mg po bid for mood stabilization -Benztropine 0.5 mg po bid for Medication induced EPS -Offer other PRN medications per need -Encourage group participation.   Delfin Gant, NP-PMHNP-BC 12/15/2020, 11:13 AM

## 2020-12-16 MED ORDER — DIVALPROEX SODIUM 500 MG PO DR TAB
750.0000 mg | DELAYED_RELEASE_TABLET | Freq: Two times a day (BID) | ORAL | Status: DC
  Filled 2020-12-16 (×2): qty 1

## 2020-12-16 MED ORDER — DIVALPROEX SODIUM ER 500 MG PO TB24
1250.0000 mg | ORAL_TABLET | Freq: Every day | ORAL | Status: DC
  Administered 2020-12-16 – 2021-01-03 (×18): 1250 mg via ORAL
  Filled 2020-12-16: qty 2
  Filled 2020-12-16 (×6): qty 1
  Filled 2020-12-16: qty 2
  Filled 2020-12-16 (×12): qty 1
  Filled 2020-12-16: qty 2
  Filled 2020-12-16 (×4): qty 1

## 2020-12-16 NOTE — Plan of Care (Signed)
Patient would not respond to support and encouragement to take medication, talk to staff, etc.

## 2020-12-16 NOTE — Progress Notes (Signed)
D:  Patient would not take medications today.  This morning patient stated he was very tired and did not feel like getting out of bed to come and talk to nurse at the med window.  Patient angry because his depakote makes him very sleepy.   A:  No medications given to patient today.  Refused all scheduled medications.  Emotional support and encouragement given patient but he did not respond.  R:  This afternoon patient denied SI and HI, contracts for safety.  Denied A/V hallucinations.  Patient stayed in bed most of the day. Safety maintained with 15 minute checks.

## 2020-12-16 NOTE — BHH Group Notes (Signed)
BHH LCSW Group Therapy Note  12/16/2020    Type of Therapy and Topic:  Group Therapy:  A Hero Worthy of Support  Participation Level:  Minimal   Description of Group:  Patients in this group were introduced to the concept that additional supports including self-support are an essential part of recovery.  Matching needs with supports to help fulfill those needs was explained.  Establishing boundaries that can gradually be increased or decreased was described, with patients giving their own examples of establishing appropriate boundaries in their lives.  A song entitled "My Own Hero" was played and a group discussion ensued in which patients stated it inspired them to help themselves in order to succeed, because other people cannot achieve their goals such as sobriety or stability for them.  A song was played called "I Am Enough" which led to a discussion about being willing to believe we are worth the effort of being a self-support.   Therapeutic Goals: 1)  demonstrate the importance of being a key part of one's own support system 2)  discuss various available supports 3)  encourage patient to use music as part of their self-support and focus on goals 4)  elicit ideas from patients about supports that need to be added   Summary of Patient Progress:  The patient expressed that the type of support he needs at discharge is "friends."  He did not explain further.  He is not seen developing friendships on the unit.  He did not talk any further in group, although he remained attentive.   Therapeutic Modalities:   Motivational Interviewing Activity  [name]
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
## 2020-12-16 NOTE — Progress Notes (Signed)
   12/16/20 2200  Psych Admission Type (Psych Patients Only)  Admission Status Voluntary  Psychosocial Assessment  Patient Complaints Depression  Eye Contact Brief  Facial Expression Anxious  Affect Appropriate to circumstance  Speech Unremarkable  Interaction Minimal;Guarded  Motor Activity Slow  Appearance/Hygiene Unremarkable  Behavior Characteristics Cooperative  Mood Depressed  Thought Process  Coherency WDL  Content WDL  Delusions None reported or observed  Perception WDL  Hallucination None reported or observed  Judgment Poor  Confusion Mild  Danger to Self  Current suicidal ideation? Denies  Danger to Others  Danger to Others None reported or observed   Patient is isolative in his room most of the evening, Patient encouraged to get out of bed and sit in the day room and agreed. He refused HS [place] because he has been sleepy all day. Complaint with divalproex. Denies SI/HI/A/VH and verbally contracted for safety. Support and encouragement provided as needed. Q 15 minutes safety checks ongoing without self harm gestures.

## 2020-12-16 NOTE — Progress Notes (Signed)
[facility] MD Progress Note  12/16/2020 3:45 PM Anthony Knox  MRN:  [id number] Subjective:  " My brother felt I was not taking my Mental health medication and brought me here"  Objective:  Report received from Nursing, Records reviewed and care plan and medications reviewed with members of our interdisciplinary team.  Patient was met in his room this afternoon sleeping.  He was irritable and unwilling to engage in meaningful conversation.  He final opened to this writer expressing anger and frustration regarding Depakote.  He is angry that he is being given Depakote twice a day.  He refused all am medications this morning.  He believes he cannot function or do anything in the day time when he is tired and too sleepy from taking day time Depakote.  Depakote level yesterday came back to 89 this morning.  Patient was reassured that Depakote will be changed to night time and dose decreased.  He is compliant with Medication although this morning he did not want to take it.  Patient attended group but no participation.  He denies feeling SI/HI/AVH.  Once he was informed of the changes made to his Depakote patient was apologetic to Probation officer for his earlier behavior.  Depakote is decreased and changed to Depakote ER 1250 mg po at bed time. Principal Problem: <principal problem not specified> Diagnosis: Active Problems:   Schizoaffective disorder (HCC)  Total Time spent with patient: 25 minutes  Past Psychiatric History:  See H&P note  Past Medical History:  Past Medical History:  Diagnosis Date  . Schizophrenia ([place])    History reviewed. No pertinent surgical history. Family History: History reviewed. No pertinent family history. Family Psychiatric  History: see H&P note Social History:  Social History   Substance and Sexual Activity  Alcohol Use Not Currently     Social History   Substance and Sexual Activity  Drug Use Never    Social History   Socioeconomic History  . Marital status: Single     Spouse name: Not on file  . Number of children: Not on file  . Years of education: Not on file  . Highest education level: Not on file  Occupational History  . Not on file  Tobacco Use  . Smoking status: Never Smoker  . Smokeless tobacco: Never Used  Vaping Use  . Vaping Use: Never used  Substance and Sexual Activity  . Alcohol use: Not Currently  . Drug use: Never  . Sexual activity: Not Currently  Other Topics Concern  . Not on file  Social History Narrative  . Not on file   Social Determinants of Health   Financial Resource Strain: Not on file  Food Insecurity: Not on file  Transportation Needs: Not on file  Physical Activity: Not on file  Stress: Not on file  Social Connections: Not on file   Additional Social History:                         Sleep: Good  Appetite:  Fair  Current Medications: Current Facility-Administered Medications  Medication Dose Route Frequency Provider Last Rate Last Admin  . acetaminophen (TYLENOL) tablet 650 mg  650 mg Oral Q6H PRN Damita Dunnings B, MD      . alum & mag hydroxide-simeth (MAALOX/MYLANTA) 200-200-20 MG/5ML suspension 30 mL  30 mL Oral Q4H PRN McQuilla, Jai B, MD      . benztropine (COGENTIN) tablet 0.5 mg  0.5 mg Oral BID Burt Ek, Gayland Curry, FNP  0.5 mg at 12/15/20 1829  . divalproex (DEPAKOTE) DR tablet 750 mg  750 mg Oral Q12H Charleigh Correnti C, NP      . hydrOXYzine (ATARAX/VISTARIL) tablet 50 mg  50 mg Oral Q4H PRN Suella Broad, FNP   50 mg at 12/15/20 0810  . magnesium hydroxide (MILK OF MAGNESIA) suspension 30 mL  30 mL Oral Daily PRN Damita Dunnings B, MD      . polyethylene glycol (MIRALAX / GLYCOLAX) packet 17 g  17 g Oral Daily PRN Sharma Covert, MD      . ramelteon (ROZEREM) tablet 8 mg  8 mg Oral QHS Suella Broad, FNP   8 mg at 12/15/20 2114  . risperiDONE (RISPERDAL M-TABS) disintegrating tablet 3 mg  3 mg Oral BID Sharma Covert, MD   3 mg at 12/15/20 1829  . senna  (SENOKOT) tablet 8.6 mg  1 tablet Oral Daily Suella Broad, FNP   8.6 mg at 12/15/20 0809    Lab Results:  No results found for this or any previous visit (from the past 48 hour(s)).  Blood Alcohol level:  Lab Results  Component Value Date   ETH <10 06/18/2018   ETH <10 76/28/3151    Metabolic Disorder Labs: Lab Results  Component Value Date   HGBA1C 5.9 (H) 12/07/2020   MPG 123 12/07/2020   MPG 122.63 05/08/2018   Lab Results  Component Value Date   PROLACTIN 34.1 (H) 05/08/2018   Lab Results  Component Value Date   CHOL 170 12/07/2020   TRIG 56 12/07/2020   HDL 32 (L) 12/07/2020   CHOLHDL 5.3 12/07/2020   VLDL 11 12/07/2020   LDLCALC 127 (H) 12/07/2020   LDLCALC 87 05/08/2018    Physical Findings: AIMS: Facial and Oral Movements Muscles of Facial Expression: None, normal Lips and Perioral Area: None, normal Jaw: None, normal Tongue: None, normal,Extremity Movements Upper (arms, wrists, hands, fingers): None, normal Lower (legs, knees, ankles, toes): None, normal, Trunk Movements Neck, shoulders, hips: None, normal, Overall Severity Severity of abnormal movements (highest score from questions above): None, normal Incapacitation due to abnormal movements: None, normal Patient's awareness of abnormal movements (rate only patient's report): No Awareness, Dental Status Current problems with teeth and/or dentures?: No Does patient usually wear dentures?: No  CIWA:    COWS:     Musculoskeletal: Strength & Muscle Tone: within normal limits Gait & Station: normal Patient leans: Front  Psychiatric Specialty Exam: Psychiatric Specialty Exam: Physical Exam Vitals and nursing note reviewed.  Constitutional:      Appearance: Normal appearance.  HENT:     Head: Normocephalic.  Cardiovascular:     Rate and Rhythm: Normal rate.  Pulmonary:     Effort: Pulmonary effort is normal.  Musculoskeletal:     Cervical back: Normal range of motion.   Neurological:     Mental Status: He is alert.     Review of Systems  Constitutional: Negative.   HENT: Negative.   Eyes: Negative.   Respiratory: Negative.   Cardiovascular: Negative.   Gastrointestinal: Negative.   Genitourinary: Negative.   Musculoskeletal: Negative.   Skin: Negative.   Neurological: Negative.     Blood pressure 113/74, pulse (!) 58, temperature (!) 97.5 F (36.4 C), temperature source Oral, resp. rate 16, height '5\' 7"'  (1.702 m), weight 79.8 kg, SpO2 99 %.Body mass index is 27.57 kg/m.  General Appearance: Casual  Eye Contact:  Minimal  Speech:  Clear and Coherent  Volume:  Normal  Mood:  Angry, Depressed and Irritable  Affect:  Congruent  Thought Process:  Coherent  Orientation:  Full (Time, Place, and Person)  Thought Content:  Logical  Suicidal Thoughts:  No  Homicidal Thoughts:  No  Memory:  Immediate;   Good Recent;   Good Remote;   Good  Judgement:  Fair  Insight:  Good  Psychomotor Activity:  Normal  Concentration:  Concentration: Good and Attention Span: Good  Recall:  Good  Fund of Knowledge:  Good  Language:  Good  Akathisia:  No  Handed:  Right  AIMS (if indicated):     Assets:  Communication Skills Desire for Improvement Physical Health  ADL's:  Intact  Cognition:  WNL  Sleep:  Number of Hours: 6.5    Presentation  General Appearance: Appropriate for Environment  Eye Contact:Fair  Speech:Normal Rate  Speech Volume:Normal  Handedness:Right   Mood and Affect  Mood:Euthymic  Affect:Flat   Thought Process  Thought Processes:Goal Directed  Descriptions of Associations:Circumstantial  Orientation:Full (Time, Place and Person)  Thought Content:Paranoid Ideation  History of Schizophrenia/Schizoaffective disorder:Yes  Duration of Psychotic Symptoms:Greater than six months  Hallucinations:No data recorded  Ideas of Reference:Delusions; Paranoia  Suicidal Thoughts:No data recorded  Homicidal Thoughts:No data  recorded   Sensorium  Memory:Immediate Poor; Recent Poor; Remote Poor  Judgment:Impaired  Insight:Lacking   Executive Functions  Concentration:Fair  Attention Span:Fair  Recall:Poor  Fund of Knowledge:Fair  Language:Good   Psychomotor Activity  Psychomotor Activity:No data recorded   Assets  Assets:Desire for Improvement; Resilience   Sleep  Sleep:No data recorded    Physical Exam: Physical Exam Vitals and nursing note reviewed.  Constitutional:      Appearance: Normal appearance.  HENT:     Head: Normocephalic.  Cardiovascular:     Rate and Rhythm: Normal rate.  Pulmonary:     Effort: Pulmonary effort is normal.  Musculoskeletal:     Cervical back: Normal range of motion.  Neurological:     Mental Status: He is alert.    Review of Systems  Constitutional: Negative.   HENT: Negative.   Eyes: Negative.   Respiratory: Negative.   Cardiovascular: Negative.   Gastrointestinal: Negative.   Genitourinary: Negative.   Musculoskeletal: Negative.   Skin: Negative.   Neurological: Negative.   Endo/Heme/Allergies: Negative.    Blood pressure 113/74, pulse (!) 58, temperature (!) 97.5 F (36.4 C), temperature source Oral, resp. rate 16, height '5\' 7"'  (1.702 m), weight 79.8 kg, SpO2 99 %. Body mass index is 27.57 kg/m.   Treatment Plan Summary: Daily contact with patient to assess and evaluate symptoms and progress in treatment and Medication management   -Continue Risperidone 3 mg po bid for mood /Mental health -Ramelteon 8 mg po at bed time for sleep -Discontinue -Depakote DR 1000 mg po bid for mood stabilization -Start Depakote 1250 ER QHS  For mood stabilization. -Benztropine 0.5 mg po bid for Medication induced EPS -Offer other PRN medications per need -Encourage group participation.   Delfin Gant, NP-PMHNP-BC 12/16/2020, 3:45 PM

## 2020-12-16 NOTE — Progress Notes (Signed)
Patient very tired, sleepy.  Would not come to med window for meds.  Stated he did not need his meds.  "Why are you giving me my sleep medicines this early?"  Nurse requested patient to come to med window but he would not get out of bed. Respirations even and unlabored.  No signs/symptoms of pain/distress noted on patient's face/body movements.  Safety maintained with 15 minute checks.

## 2020-12-16 NOTE — [facility] Group Notes (Signed)
Patient did not attend goals group.  

## 2020-12-16 NOTE — Progress Notes (Signed)
Psychoeducational Group Note  Date:  12/16/2020 Time:  2147  Group Topic/Focus:  Wrap-Up Group:   The focus of this group is to help patients review their daily goal of treatment and discuss progress on daily workbooks.  Participation Level: Did Not Attend  Participation Quality:  Not Applicable  Affect:  Not Applicable  Cognitive:  Not Applicable  Insight:  Not Applicable  Engagement in Group: Not Applicable  Additional Comments:  The patient did not attend group this evening.   Hazle Coca S 12/16/2020, 9:47 PM

## 2020-12-17 MED ORDER — RISPERIDONE 2 MG PO TBDP
6.0000 mg | ORAL_TABLET | Freq: Every day | ORAL | Status: DC
  Administered 2020-12-17: 6 mg via ORAL
  Filled 2020-12-17 (×3): qty 3

## 2020-12-17 MED ORDER — BENZTROPINE MESYLATE 1 MG PO TABS: 1.0000 mg | ORAL_TABLET | Freq: Every day | ORAL | Status: DC

## 2020-12-17 MED ORDER — RISPERIDONE 2 MG PO TBDP: 4.0000 mg | ORAL_TABLET | Freq: Every day | ORAL | Status: DC

## 2020-12-17 MED ORDER — RISPERIDONE 2 MG PO TBDP: 6.0000 mg | ORAL_TABLET | Freq: Every day | ORAL | Status: DC

## 2020-12-17 MED ORDER — BENZTROPINE MESYLATE 1 MG PO TABS
1.0000 mg | ORAL_TABLET | Freq: Every day | ORAL | Status: DC
  Administered 2020-12-17 – 2020-12-23 (×7): 1 mg via ORAL
  Filled 2020-12-17 (×11): qty 1

## 2020-12-17 NOTE — Progress Notes (Signed)
Recreation Therapy Notes  Date:  6.6.22 Time: 0930 Location: [address] Dayroom  Group Topic: Stress Management  Goal Area(s) Addresses:  Patient will identify positive stress management techniques. Patient will identify benefits of using stress management post d/c.  Intervention: Stress Management  Activity: Meditation.  LRT played a meditation that focused on setting boundaries and that it's ok to say no for your own self care even if it's not what others want.    Education:  Stress Management, Discharge Planning.   Education Outcome: Acknowledges Education  Clinical Observations/Feedback: Pt did not attend group session.     Eliya Bubar, LRT/CTRS         Geanna Divirgilio A 12/17/2020 12:24 PM 

## 2020-12-17 NOTE — [facility] Group Notes (Signed)
Occupational Therapy Group Note Date: 12/17/2020 Group Topic/Focus: Stress Management  Group Description: Group encouraged increased participation and engagement through discussion focused on topic of stress management. Patients engaged interactively to discuss components of stress including physical signs, emotional signs, negative management strategies, and positive management strategies. Each individual identified one new stress management strategy they would like to try moving forward.    Therapeutic Goals: Identify current stressors Identify healthy vs unhealthy stress management strategies/techniques Discuss and identify physical and emotional signs of stress Participation Level: Patient did not attend OT group session despite personal invitation.    Plan: Continue to engage patient in OT groups 2 - 3x/week.  12/17/2020  Becka Lagasse, MOT, OTR/L   

## 2020-12-17 NOTE — Progress Notes (Signed)
Patient did not attend wrap up group. 

## 2020-12-17 NOTE — Progress Notes (Signed)
Patient has been isolative to room this shift.  Patient declined to engage writer in 1:1 therapeutic staff talk.  Patient accused nurse of "trying to cover something up" and demanded that she leave the room.  Patient refused all morning medications.  Patient did not respond to nursing assessment questions.   Assess patient for safety, offer medications as prescribed, engage patient in 1:1 staff talks. Encourage patient to comply with treatment.   Patient able to contract for safety. Continue to monitor as planned.

## 2020-12-17 NOTE — [facility] Group Notes (Signed)
Type of Therapy and Topic:  Group Therapy - Healthy vs Unhealthy Coping Skills  Participation Level:  Did Not Attend   Description of Group The focus of this group was to determine what unhealthy coping techniques typically are used by group members and what healthy coping techniques would be helpful in coping with various problems. Patients were guided in becoming aware of the differences between healthy and unhealthy coping techniques. Patients were asked to identify 2-3 healthy coping skills they would like to learn to use more effectively.  Therapeutic Goals 1. Patients learned that coping is what human beings do all day long to deal with various situations in their lives 2. Patients defined and discussed healthy vs unhealthy coping techniques 3. Patients identified their preferred coping techniques and identified whether these were healthy or unhealthy 4. Patients determined 2-3 healthy coping skills they would like to become more familiar with and use more often. 5. Patients provided support and ideas to each other   Summary of Patient Progress:  Did not attend  

## 2020-12-17 NOTE — Progress Notes (Signed)
[facility] MD Progress Note  12/17/2020 2:08 PM Anthony Knox  MRN:  [id number] Subjective:  " My brother felt I was not taking my Mental health medication and brought me here"  Objective:  Report received from Nursing, Records reviewed and care plan and medications reviewed with members of our interdisciplinary team.  Patient was met in his room this afternoon, he was lying on his bed, awake. He stated he is bored when asked about anxiety. He did not want to go to afternoon group. He appears depressed. He rated his depression as a 7/10. He stated he lives alone and has an older brother who looks in on him frequently.  He refused his risperdal and cogentin this morning. He did take his Depakote last night. He has no explanation why he is refusing his AM medications.  We discussed ECT and he asked what ECT is. Today he stated he would do this if it meant he would be better. According to Angelena Form, Dr Weber Cooks will not be able to take any ECT patients to [place] this week. He denies SI/HI/AVH, paranoia and delusions. He stated he has been on disability for his mental health for 12 years. Once he was informed of the changes made to his Depakote patient was apologetic to Probation officer for his earlier behavior.  Depakote is decreased and changed to Depakote ER 1250 mg po at bed time. Will increase nighttime Risperdal to 6 mg PO tonight and discontinue morning dose. Will continue to titrate and consilodate his medications to nighttime if he takes them. Patient is calm and cooperative. He reported a good appetite. He interacts appropriately on the unit with staff and peers. Will continue to monitor and adjust medications as needed for symptom control/improvement.   Principal Problem: MDD (major depressive disorder), recurrent episode, severe ([place]) Diagnosis: Active Problems:   Schizoaffective disorder ([place])  Total Time spent with patient: 25 minutes  Past Psychiatric History:  See H&P   Past Medical History:  Past  Medical History:  Diagnosis Date  . Schizophrenia ([place])    History reviewed. No pertinent surgical history. Family History: History reviewed. No pertinent family history. Family Psychiatric  History: see H&P note Social History:  Social History   Substance and Sexual Activity  Alcohol Use Not Currently     Social History   Substance and Sexual Activity  Drug Use Never    Social History   Socioeconomic History  . Marital status: Single    Spouse name: Not on file  . Number of children: Not on file  . Years of education: Not on file  . Highest education level: Not on file  Occupational History  . Not on file  Tobacco Use  . Smoking status: Never Smoker  . Smokeless tobacco: Never Used  Vaping Use  . Vaping Use: Never used  Substance and Sexual Activity  . Alcohol use: Not Currently  . Drug use: Never  . Sexual activity: Not Currently  Other Topics Concern  . Not on file  Social History Narrative  . Not on file   Social Determinants of Health   Financial Resource Strain: Not on file  Food Insecurity: Not on file  Transportation Needs: Not on file  Physical Activity: Not on file  Stress: Not on file  Social Connections: Not on file   Additional Social History:     Sleep: Good  Appetite:  Fair  Current Medications: Current Facility-Administered Medications  Medication Dose Route Frequency Provider Last Rate Last Admin  .  acetaminophen (TYLENOL) tablet 650 mg  650 mg Oral Q6H PRN Damita Dunnings B, MD      . alum & mag hydroxide-simeth (MAALOX/MYLANTA) 200-200-20 MG/5ML suspension 30 mL  30 mL Oral Q4H PRN Damita Dunnings B, MD      . benztropine (COGENTIN) tablet 0.5 mg  0.5 mg Oral BID Suella Broad, FNP   0.5 mg at 12/15/20 1829  . divalproex (DEPAKOTE ER) 24 hr tablet 1,250 mg  1,250 mg Oral QHS Onuoha, Josephine C, NP   1,250 mg at 12/16/20 2130  . hydrOXYzine (ATARAX/VISTARIL) tablet 50 mg  50 mg Oral Q4H PRN Suella Broad, FNP   50 mg at  12/15/20 0810  . magnesium hydroxide (MILK OF MAGNESIA) suspension 30 mL  30 mL Oral Daily PRN Damita Dunnings B, MD      . polyethylene glycol (MIRALAX / GLYCOLAX) packet 17 g  17 g Oral Daily PRN Sharma Covert, MD      . ramelteon (ROZEREM) tablet 8 mg  8 mg Oral QHS Suella Broad, FNP   8 mg at 12/15/20 2114  . risperiDONE (RISPERDAL M-TABS) disintegrating tablet 3 mg  3 mg Oral BID Sharma Covert, MD   3 mg at 12/15/20 1829  . senna (SENOKOT) tablet 8.6 mg  1 tablet Oral Daily Suella Broad, FNP   8.6 mg at 12/15/20 0809    Lab Results:  No results found for this or any previous visit (from the past 48 hour(s)).  Blood Alcohol level:  Lab Results  Component Value Date   ETH <10 06/18/2018   ETH <10 79/09/8331    Metabolic Disorder Labs: Lab Results  Component Value Date   HGBA1C 5.9 (H) 12/07/2020   MPG 123 12/07/2020   MPG 122.63 05/08/2018   Lab Results  Component Value Date   PROLACTIN 34.1 (H) 05/08/2018   Lab Results  Component Value Date   CHOL 170 12/07/2020   TRIG 56 12/07/2020   HDL 32 (L) 12/07/2020   CHOLHDL 5.3 12/07/2020   VLDL 11 12/07/2020   LDLCALC 127 (H) 12/07/2020   LDLCALC 87 05/08/2018    Physical Findings: AIMS: Facial and Oral Movements Muscles of Facial Expression: None, normal Lips and Perioral Area: None, normal Jaw: None, normal Tongue: None, normal,Extremity Movements Upper (arms, wrists, hands, fingers): None, normal Lower (legs, knees, ankles, toes): None, normal, Trunk Movements Neck, shoulders, hips: None, normal, Overall Severity Severity of abnormal movements (highest score from questions above): None, normal Incapacitation due to abnormal movements: None, normal Patient's awareness of abnormal movements (rate only patient's report): No Awareness, Dental Status Current problems with teeth and/or dentures?: No Does patient usually wear dentures?: No  CIWA:    COWS:     Musculoskeletal: Strength &  Muscle Tone: within normal limits Gait & Station: normal Patient leans: Front  Psychiatric Specialty Exam: Psychiatric Specialty Exam: Physical Exam Vitals and nursing note reviewed.  Constitutional:      Appearance: Normal appearance.  HENT:     Head: Normocephalic.  Cardiovascular:     Rate and Rhythm: Normal rate.  Pulmonary:     Effort: Pulmonary effort is normal.  Musculoskeletal:     Cervical back: Normal range of motion.  Neurological:     Mental Status: He is alert.     Review of Systems  Constitutional: Negative.   HENT: Negative.   Eyes: Negative.   Respiratory: Negative.   Cardiovascular: Negative.   Gastrointestinal: Negative.   Genitourinary: Negative.  Musculoskeletal: Negative.   Skin: Negative.   Neurological: Negative.     Blood pressure 102/80, pulse (!) 57, temperature (!) 97.3 F (36.3 C), temperature source Oral, resp. rate 17, height _0  (1.702 m), weight 79.8 kg, SpO2 97 %.Body mass index is 27.57 kg/m.  General Appearance: Casual  Eye Contact:  Minimal  Speech:  Clear and Coherent  Volume:  Normal  Mood:  Angry, Depressed and Irritable  Affect:  Congruent  Thought Process:  Coherent  Orientation:  Full (Time, Place, and Person)  Thought Content:  Logical  Suicidal Thoughts:  No  Homicidal Thoughts:  No  Memory:  Immediate;   Good Recent;   Good Remote;   Good  Judgement:  Fair  Insight:  Good  Psychomotor Activity:  Normal  Concentration:  Concentration: Good and Attention Span: Good  Recall:  Good  Fund of Knowledge:  Good  Language:  Good  Akathisia:  No  Handed:  Right  AIMS (if indicated):     Assets:  Communication Skills Desire for Improvement Physical Health  ADL's:  Intact  Cognition:  WNL  Sleep:  Number of Hours: 6    Presentation  General Appearance: Appropriate for Environment  Eye Contact:Fair  Speech:Normal Rate  Speech Volume:Normal  Handedness:Right   Mood and Affect   Mood:Euthymic  Affect:Flat   Thought Process  Thought Processes:Goal Directed  Descriptions of Associations:Circumstantial  Orientation:Full (Time, Place and Person)  Thought Content:Paranoid Ideation  History of Schizophrenia/Schizoaffective disorder:Yes  Duration of Psychotic Symptoms:Greater than six months  Hallucinations:No data recorded  Ideas of Reference:Delusions; Paranoia  Suicidal Thoughts:No data recorded  Homicidal Thoughts:No data recorded   Sensorium  Memory:Immediate Poor; Recent Poor; Remote Poor  Judgment:Impaired  Insight:Lacking   Executive Functions  Concentration:Fair  Attention Span:Fair  Recall:Poor  Fund of Knowledge:Fair  Language:Good   Psychomotor Activity  Psychomotor Activity:No data recorded   Assets  Assets:Desire for Improvement; Resilience   Sleep  Sleep:No data recorded    Physical Exam: Physical Exam Vitals and nursing note reviewed.  Constitutional:      Appearance: Normal appearance.  HENT:     Head: Normocephalic.  Cardiovascular:     Rate and Rhythm: Normal rate.  Pulmonary:     Effort: Pulmonary effort is normal.  Musculoskeletal:     Cervical back: Normal range of motion.  Neurological:     Mental Status: He is alert.    Review of Systems  Constitutional: Negative.   HENT: Negative.   Eyes: Negative.   Respiratory: Negative.   Cardiovascular: Negative.   Gastrointestinal: Negative.   Genitourinary: Negative.   Musculoskeletal: Negative.   Skin: Negative.   Neurological: Negative.   Endo/Heme/Allergies: Negative.    Blood pressure 102/80, pulse (!) 57, temperature (!) 97.3 F (36.3 C), temperature source Oral, resp. rate 17, height _1  (1.702 m), weight 79.8 kg, SpO2 97 %. Body mass index is 27.57 kg/m.   Treatment Plan Summary: Daily contact with patient to assess and evaluate symptoms and progress in treatment and Medication management   Depression:   -Change Risperidone  to 6 mg po at bedtime, patient is refusing daytime medications  Insomnia:  -Ramelteon 8 mg po at bed time for sleep  Mood Stabilization:  -Continue Depakote 1250 ER QHS  For mood stabilization.  EPS -Change Benztropine 0.5 mg po bid to 1 mg po at bedtime, patient is refusing daytime medications   -Offer PRN medications as needed for pain, constipation and indigestion  Continue 15 minute safety  checks Encourage participation in the therapeutic milieu Discharge planning in progress    Ethelene Hal, NP 12/17/2020, 5:58 PM

## 2020-12-18 MED ORDER — RISPERIDONE 2 MG PO TBDP
5.0000 mg | ORAL_TABLET | Freq: Every day | ORAL | Status: DC
  Administered 2020-12-18 – 2020-12-19 (×2): 5 mg via ORAL
  Filled 2020-12-18 (×2): qty 2.5
  Filled 2020-12-18: qty 5
  Filled 2020-12-18 (×2): qty 2.5

## 2020-12-18 MED ORDER — HYDROXYZINE HCL 25 MG PO TABS
25.0000 mg | ORAL_TABLET | Freq: Four times a day (QID) | ORAL | Status: DC | PRN
  Administered 2020-12-20: 25 mg via ORAL
  Filled 2020-12-18: qty 1
  Filled 2020-12-18: qty 10
  Filled 2020-12-18: qty 1

## 2020-12-18 NOTE — [facility] Group Notes (Signed)
Adult Psychoeducational Group Note  Date:  12/18/2020 Time:  10:09 AM  Group Topic/Focus:  Goals Group:   The focus of this group is to help patients establish daily goals to achieve during treatment and discuss how the patient can incorporate goal setting into their daily lives to aide in recovery.  Participation Level:  Active  Participation Quality:  Attentive  Affect:  Appropriate  Cognitive:  Appropriate  Insight: Appropriate  Engagement in Group:  Engaged  Modes of Intervention:  Confrontation and Discussion  Additional Comments: [name]     Lizmarie Witters T Constant [place] 12/18/2020, 10:09 AM
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
## 2020-12-18 NOTE — Progress Notes (Signed)
Recreation Therapy Notes  Animal-Assisted Activity (AAA) Program Checklist/Progress Notes Patient Eligibility Criteria Checklist & Daily Group note for Rec Tx Intervention  Date: 6.7.22 Time: 1430 Location: [address]   AAA/T Program Assumption of Risk Form signed by Engineer, production or Parent Legal Guardian  YES   Patient is free of allergies or severe asthma  YES  Patient reports no fear of animals  YES  Patient reports no history of cruelty to animals YES   Patient understands his/her participation is voluntary  YES   Patient washes hands before animal contact  YES   Patient washes hands after animal contact YES  Behavioral Response: Engaged  Education: Charity fundraiser, Appropriate Animal Interaction   Education Outcome: Acknowledges understanding/In group clarification offered/Needs additional education.   Clinical Observations/Feedback: Pt attended and participated in activity.    Caroll Rancher, LRT/CTRS         Caroll Rancher A 12/18/2020 3:27 PM

## 2020-12-18 NOTE — Progress Notes (Addendum)
   12/17/20 2100  Psych Admission Type (Psych Patients Only)  Admission Status Voluntary  Psychosocial Assessment  Patient Complaints None  Eye Contact Avoids  Facial Expression Flat  Affect Flat  Speech Logical/coherent  Interaction Minimal  Motor Activity Slow  Appearance/Hygiene Unremarkable  Behavior Characteristics Cooperative  Mood Depressed  Thought Process  Coherency WDL  Content WDL  Delusions None reported or observed  Perception WDL  Hallucination None reported or observed  Judgment Poor  Confusion UTA  Danger to Self  Current suicidal ideation? Denies  Danger to Others  Danger to Others None reported or observed   Pt seen at med window. Denies SI, HI, AVH and pain. Explained nighttime meds and pt took them without complaint.

## 2020-12-18 NOTE — [facility] Group Notes (Addendum)
[facility] Group Notes:  (Nursing/MHT/Case Management/Adjunct)  Date:  12/18/2020  Time:  9:56 AM  Type of Therapy:  Goals group  Participation Level:  Active  Participation Quality:  Attentive and Sharing  Affect:  Anxious  Cognitive:  Alert  Insight:  Improving  Engagement in Group:  Engaged  Modes of Intervention:  Discussion and Education  Summary of Progress/Problems: Arlys John reported slept "pretty good" without sleeping medication.  His goal for today is "quit analyzing everything to make things right."  Levin Bacon 12/18/2020, 9:56 AM

## 2020-12-18 NOTE — [facility] Group Notes (Addendum)
[facility] Group Notes:  (Nursing/MHT/Case Management/Adjunct)  Date:  12/18/2020  Time:  9:54 AM  Type of Therapy:  Goals group  Participation Level:  Active  Participation Quality:  Attentive and Sharing  Affect:  Flat  Cognitive:  Alert and Appropriate  Insight:  Improving  Engagement in Group:  Engaged  Modes of Intervention:  Discussion and Education  Summary of Progress/Problems:  Hue reported his sleep was "ok, I tossed and turned all night."  His goal for today is "quit analyzing everything so I can make things right."  Levin Bacon 12/18/2020, 9:54 AM

## 2020-12-18 NOTE — [facility] Group Notes (Signed)
[facility] Group Notes:  (Nursing/MHT/Case Management/Adjunct)  Date:  12/18/2020  Time:  6:40 PM  Type of Therapy:  Sharing / Getting to know you  Participation Level:  Did Not Attend  Participation Quality:    Affect:    Cognitive:    Insight:    Engagement in Group:    Modes of Intervention:  Activity  Summary of Progress/Problems:  Did not attend despite staff encouragement.  Braylon Grenda V Qais Jowers 12/18/2020, 6:40 PM  

## 2020-12-18 NOTE — Progress Notes (Addendum)
[facility] MD Progress Note  12/18/2020 3:17 PM Anthony Knox  MRN:  [id number]   Reason for admission: The patient is a 60 year old male with a reported past psychiatric history of schizophrenia who was admitted because "my brother felt I was not taking my mental health medication and brought me here."  Chart notes document that he was irritable, disorganized, tangential and paranoid on admission.  Objective: Medical record reviewed.  Patient's case discussed in detail with members of the treatment team.  I met with and evaluated the patient today for follow-up on the unit.  The patient is reclining in bed but awakens easily and cooperates with our interaction.  He is vague and a poor historian.  His affect is constricted and he appears depressed.  Thought processes are coherent and goal-directed.  He does not appear to attend or respond to internal stimuli and no overt psychotic content is elicited.  The patient reports feeling "woozy" and tired today from the medications he took last night and has felt slightly off balance when walking.  He describes his mood as sad and tired.  He denies SI, AI, HI, AH, VH or PI.  He reports that he did get up and go to meals today and went to groups.  The patient states that he is eating and sleeping okay.  He denies nausea, vomiting or slurred speech.  He denies other medication side effects or physical problems.  Patient remains interested in ECT.  He prefers to continue taking medication only at bedtime and does not want me to divide the doses of his medication in attempt to reduce side effects.  The patient was taking Depakote DR 1000 mg every 12 hours as recently as 12/15/2020.  It was decreased to a single 1250 mg bedtime dose on 12/16/2020.  It will likely take another 1 to 2 days before patient feels the full effects of this dose decrease.  Anticipate side effects of fatigue and slight feeling of ataxia will improve in the next 1 to 2 days.  Will decrease nighttime  Risperdal to 5 mg at bedtime to help reduce fatigue in the morning.  Review of MAR indicates that patient took Vistaril 50 mg once this morning for anxiety.  Vistaril likely is also contributing to patient's current symptoms of fatigue and grogginess.  The patient slept 4 hours last night.  Chart notes document that he did not attend groups last night but did attend groups this morning.  Principal Problem: MDD (major depressive disorder), recurrent episode, severe ([place]) Diagnosis: Principal Problem:   MDD (major depressive disorder), recurrent episode, severe ([place]) Active Problems:   Schizoaffective disorder ([place])  Total Time spent with patient: 20 minutes  Past Psychiatric History: See admission H&P  Past Medical History:  Past Medical History:  Diagnosis Date  . Schizophrenia ([place])    History reviewed. No pertinent surgical history. Family History: History reviewed. No pertinent family history. Family Psychiatric  History: See admission H&P Social History:  Social History   Substance and Sexual Activity  Alcohol Use Not Currently     Social History   Substance and Sexual Activity  Drug Use Never    Social History   Socioeconomic History  . Marital status: Single    Spouse name: Not on file  . Number of children: Not on file  . Years of education: Not on file  . Highest education level: Not on file  Occupational History  . Not on file  Tobacco Use  . Smoking status:  Never Smoker  . Smokeless tobacco: Never Used  Vaping Use  . Vaping Use: Never used  Substance and Sexual Activity  . Alcohol use: Not Currently  . Drug use: Never  . Sexual activity: Not Currently  Other Topics Concern  . Not on file  Social History Narrative  . Not on file   Social Determinants of Health   Financial Resource Strain: Not on file  Food Insecurity: Not on file  Transportation Needs: Not on file  Physical Activity: Not on file  Stress: Not on file  Social Connections: Not on  file   Additional Social History:                         Sleep: Fair  Appetite:  Good  Current Medications: Current Facility-Administered Medications  Medication Dose Route Frequency Provider Last Rate Last Admin  . acetaminophen (TYLENOL) tablet 650 mg  650 mg Oral Q6H PRN Damita Dunnings B, MD   650 mg at 12/18/20 0959  . alum & mag hydroxide-simeth (MAALOX/MYLANTA) 200-200-20 MG/5ML suspension 30 mL  30 mL Oral Q4H PRN Damita Dunnings B, MD      . benztropine (COGENTIN) tablet 1 mg  1 mg Oral QHS Ethelene Hal, NP   1 mg at 12/17/20 2118  . divalproex (DEPAKOTE ER) 24 hr tablet 1,250 mg  1,250 mg Oral QHS Onu[place], Josephine C, NP   1,250 mg at 12/17/20 2120  . hydrOXYzine (ATARAX/VISTARIL) tablet 50 mg  50 mg Oral Q4H PRN Suella Broad, FNP   50 mg at 12/18/20 0959  . magnesium hydroxide (MILK OF MAGNESIA) suspension 30 mL  30 mL Oral Daily PRN Damita Dunnings B, MD      . polyethylene glycol (MIRALAX / GLYCOLAX) packet 17 g  17 g Oral Daily PRN Sharma Covert, MD      . ramelteon (ROZEREM) tablet 8 mg  8 mg Oral QHS Suella Broad, FNP   8 mg at 12/17/20 2118  . risperiDONE (RISPERDAL M-TABS) disintegrating tablet 6 mg  6 mg Oral QHS Ethelene Hal, NP   6 mg at 12/17/20 2118  . senna (SENOKOT) tablet 8.6 mg  1 tablet Oral Daily Suella Broad, FNP   8.6 mg at 12/18/20 0932    Lab Results: No results found for this or any previous visit (from the past 48 hour(s)).  Blood Alcohol level:  Lab Results  Component Value Date   ETH <10 06/18/2018   ETH <10 67/06/4579    Metabolic Disorder Labs: Lab Results  Component Value Date   HGBA1C 5.9 (H) 12/07/2020   MPG 123 12/07/2020   MPG 122.63 05/08/2018   Lab Results  Component Value Date   PROLACTIN 34.1 (H) 05/08/2018   Lab Results  Component Value Date   CHOL 170 12/07/2020   TRIG 56 12/07/2020   HDL 32 (L) 12/07/2020   CHOLHDL 5.3 12/07/2020   VLDL 11 12/07/2020    LDLCALC 127 (H) 12/07/2020   LDLCALC 87 05/08/2018    Physical Findings: AIMS: Facial and Oral Movements Muscles of Facial Expression: None, normal Lips and Perioral Area: None, normal Jaw: None, normal Tongue: None, normal,Extremity Movements Upper (arms, wrists, hands, fingers): None, normal Lower (legs, knees, ankles, toes): None, normal, Trunk Movements Neck, shoulders, hips: None, normal, Overall Severity Severity of abnormal movements (highest score from questions above): None, normal Incapacitation due to abnormal movements: None, normal Patient's awareness of abnormal movements (rate only patient's  report): No Awareness, Dental Status Current problems with teeth and/or dentures?: No Does patient usually wear dentures?: No  CIWA:    COWS:     Musculoskeletal: Strength & Muscle Tone: within normal limits Gait & Station: Not tested; patient reports feeling slightly off balance when he walks but no falls Patient leans: N/A  Psychiatric Specialty Exam:  Presentation  General Appearance: Appropriate for Environment; Casual  Eye Contact:Fair  Speech:Clear and Coherent; Normal Rate  Speech Volume:Normal  Handedness:Right   Mood and Affect  Mood:Depressed  Affect:Constricted   Thought Process  Thought Processes:Coherent; Goal Directed  Descriptions of Associations:Intact  Orientation:Full (Time, Place and Person)  Thought Content:Logical; Other (comment) (Paucity of thought content elicited)  History of Schizophrenia/Schizoaffective disorder:Yes  Duration of Psychotic Symptoms:Greater than six months  Hallucinations:Hallucinations: None  Ideas of Reference:None  Suicidal Thoughts:Suicidal Thoughts: No  Homicidal Thoughts:Homicidal Thoughts: No   Sensorium  Memory:Immediate Fair; Recent Fair; Remote Fair  Judgment:Impaired  Insight:Shallow   Executive Functions  Concentration:Fair  Attention Span:Fair  [place]  Language:Good   Psychomotor Activity  Psychomotor Activity:Psychomotor Activity: Decreased   Assets  Assets:Desire for Improvement; Resilience; Social Support   Sleep  Sleep:Sleep: Fair Number of Hours of Sleep: 4    Physical Exam: Physical Exam Vitals and nursing note reviewed.  Constitutional:      Comments: Appears tired  HENT:     Head: Normocephalic and atraumatic.  Pulmonary:     Effort: Pulmonary effort is normal.  Neurological:     General: No focal deficit present.     Mental Status: He is alert.    Review of Systems  Constitutional: Positive for malaise/fatigue. Negative for diaphoresis and fever.  HENT: Negative.   Eyes: Negative.   Respiratory: Negative.   Cardiovascular: Negative.   Gastrointestinal: Negative.  Negative for nausea and vomiting.  Genitourinary: Negative.   Musculoskeletal: Negative.   Skin: Negative for rash.  Neurological: Negative for tremors and seizures.       Positive for feeling slightly off balance when ambulating  Psychiatric/Behavioral: Positive for depression. Negative for hallucinations and suicidal ideas. The patient does not have insomnia.    Blood pressure 94/76, pulse 62, temperature (!) 97.5 F (36.4 C), temperature source Oral, resp. rate 17, height _0  (1.702 m), weight 79.8 kg, SpO2 96 %. Body mass index is 27.57 kg/m.   Treatment Plan Summary: Daily contact with patient to assess and evaluate symptoms and progress in treatment and Medication management   Continue every 15-minute observation status  Encouraged participation in group therapy and therapeutic milieu  Schizoaffective disorder, currently depressed -Decrease risperidone to 5 mg at bedtime to reduce a.m. sedation.  Risperdal is being used for mood stabilization and psychotic symptoms will monitor for reemergence of psychotic symptoms -Continue Depakote 1250 ER at bedtime for mood stabilization.  Will order repeat VPA level on  12/20/2020.  EPS -Continue benztropine 1 mg at bedtime  Anxiety -Decrease hydroxyzine to 25 mg every 6 hours PRN anxiety  Insomnia -Continue Rozerem 8 mg at bedtime for sleep  Discharge planning in progress   Arthor Captain, MD 12/18/2020, 3:17 PM

## 2020-12-18 NOTE — Progress Notes (Signed)
Psychoeducational Group Note  Date:  12/18/2020 Time:  2116  Group Topic/Focus:  Wrap-Up Group:   The focus of this group is to help patients review their daily goal of treatment and discuss progress on daily workbooks.  Participation Level: Did Not Attend  Participation Quality:  Not Applicable  Affect:  Not Applicable  Cognitive:  Not Applicable  Insight:  Not Applicable  Engagement in Group: Not Applicable  Additional Comments:  The patient did not attend group.   Hazle Coca S 12/18/2020, 9:16 PM

## 2020-12-18 NOTE — Progress Notes (Signed)
   12/18/20 1000  Psych Admission Type (Psych Patients Only)  Admission Status Voluntary  Psychosocial Assessment  Patient Complaints Anxiety  Eye Contact Brief  Facial Expression Flat  Affect Blunted;Depressed  Speech Logical/coherent  Interaction Minimal  Motor Activity Slow  Appearance/Hygiene Unremarkable  Behavior Characteristics Cooperative  Mood Depressed  Thought Process  Coherency WDL  Content WDL  Delusions None reported or observed  Perception WDL  Hallucination None reported or observed  Judgment WDL  Confusion WDL  Danger to Self  Current suicidal ideation? Denies  Danger to Others  Danger to Others None reported or observed

## 2020-12-19 NOTE — Progress Notes (Addendum)
   12/19/20 2100  Psych Admission Type (Psych Patients Only)  Admission Status Voluntary  Psychosocial Assessment  Patient Complaints None  Eye Contact Brief  Facial Expression Flat  Affect Depressed;Flat  Speech Logical/coherent  Interaction Minimal;Guarded;Forwards little  Motor Activity Slow  Appearance/Hygiene Unremarkable  Behavior Characteristics Cooperative;Guarded  Mood Depressed  Thought Process  Coherency WDL  Content WDL  Delusions None reported or observed  Perception WDL  Hallucination None reported or observed  Judgment WDL  Confusion None  Danger to Self  Current suicidal ideation? Denies  Danger to Others  Danger to Others None reported or observed   Pt seen in his room. Pt denies SI, HI,AVH and pain. Pt cooperative with medications. Minimal and guarded.

## 2020-12-19 NOTE — [facility] Group Notes (Signed)
Type of Therapy and Topic: Group Therapy:  Positive Affirmations  Participation Level:  Did not attend   Description of Group: This group addressed positive affirmation toward self and others. Patients went around the room and identified two positive things about themselves and two positive things about a peer in the room. Patients reflected on how it felt to share something positive with others, to identify positive things about themselves, and to hear positive things from others. Patients were encouraged to have a daily reflection of positive characteristics or circumstances.  Therapeutic Goals 1. Patient will verbalize two of their positive qualities 2. Patient will demonstrate empathy for others by stating two positive qualities about a peer in the group 3. Patient will verbalize their feelings when voicing positive self affirmations and when voicing positive affirmations of others 4. Patients will discuss the potential positive impact on their wellness/recovery of focusing on positive traits of self and others.   Summary of Patient Progress:  Did not attend     Therapeutic Modalities Cognitive Behavioral Therapy Motivational Interviewing 

## 2020-12-19 NOTE — Progress Notes (Signed)
  D:  Pt presents with high anxiety (10/10) and depression (7/10).  Pt complains of back and foot pain with no rating, but refuses pain medication.  Pt denies SI/HI and AVH.  A:  Labs/Vitals monitored; Medication education provided; Pt supported emotionally; Pt asked to communicate his needs, concerns, and questions.  R:  Pt remains safe with 15 minute checks; Will continue POC.      12/18/20 2146  Psych Admission Type (Psych Patients Only)  Admission Status Voluntary  Psychosocial Assessment  Patient Complaints Anxiety;Depression  Eye Contact Brief  Facial Expression Flat  Affect Blunted;Depressed  Speech Logical/coherent  Interaction Minimal  Motor Activity Slow  Appearance/Hygiene Unremarkable  Behavior Characteristics Cooperative  Mood Anxious;Irritable  Thought Process  Coherency WDL  Content WDL  Delusions None reported or observed  Perception WDL  Hallucination None reported or observed  Judgment WDL  Confusion WDL  Danger to Self  Current suicidal ideation? Denies  Danger to Others  Danger to Others None reported or observed

## 2020-12-19 NOTE — Progress Notes (Signed)
[facility], [place] MD Progress Note  12/19/2020 4:20 PM Anthony Knox  MRN:  [id number]   Reason for admission: The patient is a 60 year old male with a reported past psychiatric history of schizophrenia who was admitted because "my brother felt I was not taking my mental health medication and brought me here."  Chart notes document that he was irritable, disorganized, tangential and paranoid on admission.  Objective: Medical record reviewed.  Patient's case discussed in detail with members of the treatment team.  I met with and evaluated the patient today for follow-up on the unit.  The patient is in bed when approached but sits up and is cooperative and polite with our conversation.  He appears improved today.  Eye contact is improved.  Patient is alert and affect appears brighter.  Speech is more spontaneous and there is no dysarthria or slurring.  Thought processes are coherent and goal-directed.  The patient states that his mood is a little sad but overall "a whole lot better."  He reports feeling bored in the hospital because there is nothing to do.  He denies passive wish for death or suicidal ideation.  He denies AI, HI, AH or VH.  The patient does make statements expressing concern that someone may have tampered with the water or the food in the hospital because the food does not taste right and because he felt woozy yesterday.  He states that he does not feel woozy today and does not feel as tired.  I discussed with him that the woozy feeling and the fatigue were likely secondary to his Depakote and should be improving now that the dose has been decreased.  The patient reports some occasional referential thinking that particular songs may have been written specifically about him.  No other psychotic content is elicited.  I asked the patient to describe the symptoms he experienced on admission.  Patient has difficulty describing the symptoms that caused him to come to the hospital.  He states that his brother took  him to the outpatient clinic because he felt that he was not on the correct meds while in prison and then he got admitted to the hospital from the outpatient clinic visit.  Patient states that he was released from prison 2 to 3 weeks prior to his outpatient clinic visit.  He reports first taking psychiatric medications in his 30s due to not being able to focus at work and losing weight.  He reports that he has been diagnosed with schizophrenia, antisocial personality disorder and depression in the past.  He reports that his typical symptoms of schizophrenia and depression include thoughts of dread, hopelessness, sorrow and wanting to stay at home.  He denies experiencing these symptoms currently.  The patient does not know the names of the medications he took in the past.  He reports that his last alcohol use was a couple of months ago and he denies any use of drugs or tobacco.  He denies any physical problems or current side effects from his medications.  Patient states that he is not sure why his brother is concerned about his medications and says that he does not always agree with his brother's opinions.  He slept 6.75 hours last night.  No new labs today.  Vital signs this morning include BP of 90/72, pulse of 63, temperature of 97.5.  Nursing notes report that patient is isolative and still intermittently reports feeling anxious and depressed on the unit.  Principal Problem: MDD (major depressive disorder), recurrent episode,  severe ([place]) Diagnosis: Principal Problem:   MDD (major depressive disorder), recurrent episode, severe ([place]) Active Problems:   Schizoaffective disorder ([place])  Total Time spent with patient: 25 minutes  Past Psychiatric History: See admission H&P  Past Medical History:  Past Medical History:  Diagnosis Date  . Schizophrenia ([place])    History reviewed. No pertinent surgical history. Family History: History reviewed. No pertinent family history. Family Psychiatric  History:  See admission H&P Social History:  Social History   Substance and Sexual Activity  Alcohol Use Not Currently     Social History   Substance and Sexual Activity  Drug Use Never    Social History   Socioeconomic History  . Marital status: Single    Spouse name: Not on file  . Number of children: Not on file  . Years of education: Not on file  . Highest education level: Not on file  Occupational History  . Not on file  Tobacco Use  . Smoking status: Never Smoker  . Smokeless tobacco: Never Used  Vaping Use  . Vaping Use: Never used  Substance and Sexual Activity  . Alcohol use: Not Currently  . Drug use: Never  . Sexual activity: Not Currently  Other Topics Concern  . Not on file  Social History Narrative  . Not on file   Social Determinants of Health   Financial Resource Strain: Not on file  Food Insecurity: Not on file  Transportation Needs: Not on file  Physical Activity: Not on file  Stress: Not on file  Social Connections: Not on file   Additional Social History:                         Sleep: Fair  Appetite:  Good  Current Medications: Current Facility-Administered Medications  Medication Dose Route Frequency Provider Last Rate Last Admin  . acetaminophen (TYLENOL) tablet 650 mg  650 mg Oral Q6H PRN Damita Dunnings B, MD   650 mg at 12/18/20 0959  . alum & mag hydroxide-simeth (MAALOX/MYLANTA) 200-200-20 MG/5ML suspension 30 mL  30 mL Oral Q4H PRN Damita Dunnings B, MD      . benztropine (COGENTIN) tablet 1 mg  1 mg Oral QHS Ethelene Hal, NP   1 mg at 12/18/20 2146  . divalproex (DEPAKOTE ER) 24 hr tablet 1,250 mg  1,250 mg Oral QHS Onuoha, Josephine C, NP   1,250 mg at 12/18/20 2146  . hydrOXYzine (ATARAX/VISTARIL) tablet 25 mg  25 mg Oral Q6H PRN Arthor Captain, MD      . magnesium hydroxide (MILK OF MAGNESIA) suspension 30 mL  30 mL Oral Daily PRN Damita Dunnings B, MD      . polyethylene glycol (MIRALAX / GLYCOLAX) packet 17 g  17 g  Oral Daily PRN Sharma Covert, MD      . ramelteon (ROZEREM) tablet 8 mg  8 mg Oral QHS Suella Broad, FNP   8 mg at 12/18/20 2145  . risperiDONE (RISPERDAL M-TABS) disintegrating tablet 5 mg  5 mg Oral QHS Arthor Captain, MD   5 mg at 12/18/20 2145  . senna (SENOKOT) tablet 8.6 mg  1 tablet Oral Daily Suella Broad, FNP   8.6 mg at 12/19/20 0813    Lab Results: No results found for this or any previous visit (from the past 48 hour(s)).  Blood Alcohol level:  Lab Results  Component Value Date   ETH <10 06/18/2018   ETH <10  78/24/2353    Metabolic Disorder Labs: Lab Results  Component Value Date   HGBA1C 5.9 (H) 12/07/2020   MPG 123 12/07/2020   MPG 122.63 05/08/2018   Lab Results  Component Value Date   PROLACTIN 34.1 (H) 05/08/2018   Lab Results  Component Value Date   CHOL 170 12/07/2020   TRIG 56 12/07/2020   HDL 32 (L) 12/07/2020   CHOLHDL 5.3 12/07/2020   VLDL 11 12/07/2020   LDLCALC 127 (H) 12/07/2020   LDLCALC 87 05/08/2018    Physical Findings: AIMS: Facial and Oral Movements Muscles of Facial Expression: None, normal Lips and Perioral Area: None, normal Jaw: None, normal Tongue: None, normal,Extremity Movements Upper (arms, wrists, hands, fingers): None, normal Lower (legs, knees, ankles, toes): None, normal, Trunk Movements Neck, shoulders, hips: None, normal, Overall Severity Severity of abnormal movements (highest score from questions above): None, normal Incapacitation due to abnormal movements: None, normal Patient's awareness of abnormal movements (rate only patient's report): No Awareness, Dental Status Current problems with teeth and/or dentures?: No Does patient usually wear dentures?: No  CIWA:    COWS:     Musculoskeletal: Strength & Muscle Tone: within normal limits Gait & Station: Not tested; patient reports he no longer feels off balance when he walks Patient leans: N/A  Psychiatric Specialty  Exam:  Presentation  General Appearance: Appropriate for Environment; Casual  Eye Contact:Good  Speech:Clear and Coherent; Normal Rate  Speech Volume:Normal  Handedness:Right   Mood and Affect  Mood:Depressed ("Less depressed; a whole lot better")  Affect:Appropriate; Other (comment) (Improved range and reactivity)   Thought Process  Thought Processes:Coherent; Goal Directed  Descriptions of Associations:Intact  Orientation:Full (Time, Place and Person)  Thought Content:Paranoid Ideation  History of Schizophrenia/Schizoaffective disorder:Yes  Duration of Psychotic Symptoms:Greater than six months  Hallucinations:Hallucinations: None  Ideas of Reference:Paranoia  Suicidal Thoughts:Suicidal Thoughts: No  Homicidal Thoughts:Homicidal Thoughts: No   Sensorium  Memory:Immediate Fair; Recent Fair; Remote Fair  Judgment:Impaired  Insight:Shallow   Executive Functions  Concentration:Fair  Attention Span:Fair  [place]  Language:Good   Psychomotor Activity  Psychomotor Activity:Psychomotor Activity: Normal   Assets  Assets:Desire for Improvement; Resilience; Social Support   Sleep  Sleep:Sleep: Good Number of Hours of Sleep: 6.75    Physical Exam: Physical Exam Vitals and nursing note reviewed.  Constitutional:      General: He is not in acute distress. HENT:     Head: Normocephalic and atraumatic.  Pulmonary:     Effort: Pulmonary effort is normal.  Neurological:     General: No focal deficit present.     Mental Status: He is alert.    Review of Systems  Constitutional: Negative.  Negative for diaphoresis and fever.  HENT: Negative.   Eyes: Negative.   Respiratory: Negative.   Cardiovascular: Negative.   Gastrointestinal: Negative.  Negative for nausea and vomiting.  Genitourinary: Negative.   Musculoskeletal: Negative.   Skin: Negative for rash.  Neurological: Negative.  Negative for tremors and  seizures.  Psychiatric/Behavioral: Positive for depression. Negative for hallucinations and suicidal ideas. The patient does not have insomnia.    Blood pressure 90/72, pulse 63, temperature (!) 97.5 F (36.4 C), temperature source Oral, resp. rate 17, height '5\' 7"'  (1.702 m), weight 79.8 kg, SpO2 95 %. Body mass index is 27.57 kg/m.   Treatment Plan Summary: Daily contact with patient to assess and evaluate symptoms and progress in treatment and Medication management   Continue every 15-minute observation status  Encouraged participation in  group therapy and therapeutic milieu  Schizoaffective disorder, currently depressed -Continue risperidone 5 mg at bedtime.  Risperdal is being used for mood stabilization and psychotic symptoms will monitor for reemergence of psychotic symptoms.  Consider further upward titration as tolerated and indicated. -Continue Depakote 1250 ER at bedtime for mood stabilization.  Repeat VPA level, hepatic function panel and CBC ordered for evening draw on 12/20/2020.  EPS -Continue benztropine 1 mg at bedtime  Anxiety -Continue hydroxyzine to 25 mg every 6 hours PRN anxiety  Insomnia -Continue Rozerem 8 mg at bedtime for sleep  Discharge planning in progress   Arthor Captain, MD 12/19/2020, 4:20 PM

## 2020-12-19 NOTE — Plan of Care (Signed)
  Problem: Health Behavior/Discharge Planning: Goal: Compliance with therapeutic regimen will improve Outcome: Progressing   Problem: Education: Goal: Ability to state activities that reduce stress will improve Outcome: Not Progressing   Problem: Self-Concept: Goal: Level of anxiety will decrease Outcome: Not Progressing

## 2020-12-19 NOTE — [facility] Group Notes (Signed)
[facility] Group Notes:  (Nursing/MHT/Case Management/Adjunct)  Date:  12/19/2020  Time:  9:24 AM  Type of Therapy:  Goals group  Participation Level:  Active  Participation Quality:  Appropriate and Sharing  Affect:  Depressed  Cognitive:  Alert and Appropriate  Insight:  Improving  Engagement in Group:  Engaged  Modes of Intervention:  Discussion and Education  Summary of Progress/Problems:  [name] reported that his sleep was "ok" but tossed and turned.  "To make my concentration change to positive vibe for lack of sleep."  [name] 12/19/2020, 9:24 AM

## 2020-12-19 NOTE — Progress Notes (Signed)
Pt states he slept good with remeron 8 HS. Pt denies SI/HI/AVH. Pt was minimal on approach. Pt is quiet and isolative on the unit. Pt remains safe.

## 2020-12-19 NOTE — Progress Notes (Signed)
Recreation Therapy Notes  Date: 6.8.22 Time: 0930 Location: [address] Dayroom  Group Topic: Stress Management   Goal Area(s) Addresses:  Patient will actively participate in stress management techniques presented during session.  Patient will successfully identify benefit of practicing stress management post d/c.   Behavioral Response: Appropriate  Intervention: Guided exercise with ambient sound and script  Activity :Guided Imagery  LRT provided education, instruction, and demonstration on practice of visualization via guided imagery. Patient was asked to participate in the technique introduced during session. LRT also debriefed including topics of mindfulness, stress management and specific scenarios each patient could use these techniques.    Education:  Stress Management, Discharge Planning.   Education Outcome: Acknowledges education  Clinical Observations/Feedback: Patient actively engaged in technique introduced, expressed no concerns and demonstrated ability to practice independently post d/c.     Caroll Rancher, LRT/CTRS         Caroll Rancher A 12/19/2020 11:52 AM

## 2020-12-19 NOTE — Tx Team (Addendum)
Interdisciplinary Treatment and Diagnostic Plan Update  12/19/2020 Time of Session:  Anthony Knox MRN: [id number]  Principal Diagnosis: MDD (major depressive disorder), recurrent episode, severe ([facility])  Secondary Diagnoses: Principal Problem:   MDD (major depressive disorder), recurrent episode, severe ([facility]) Active Problems:   Schizoaffective disorder ([facility])   Current Medications:  Current Facility-Administered Medications  Medication Dose Route Frequency Provider Last Rate Last Admin  . acetaminophen (TYLENOL) tablet 650 mg  650 mg Oral Q6H PRN Eliseo Gum B, MD   650 mg at 12/18/20 0959  . alum & mag hydroxide-simeth (MAALOX/MYLANTA) 200-200-20 MG/5ML suspension 30 mL  30 mL Oral Q4H PRN Eliseo Gum B, MD      . benztropine (COGENTIN) tablet 1 mg  1 mg Oral QHS Laveda Abbe, NP   1 mg at 12/18/20 2146  . divalproex (DEPAKOTE ER) 24 hr tablet 1,250 mg  1,250 mg Oral QHS Onuoha, Josephine C, NP   1,250 mg at 12/18/20 2146  . hydrOXYzine (ATARAX/VISTARIL) tablet 25 mg  25 mg Oral Q6H PRN Claudie Revering, MD      . magnesium hydroxide (MILK OF MAGNESIA) suspension 30 mL  30 mL Oral Daily PRN Eliseo Gum B, MD      . polyethylene glycol (MIRALAX / GLYCOLAX) packet 17 g  17 g Oral Daily PRN Antonieta Pert, MD      . ramelteon (ROZEREM) tablet 8 mg  8 mg Oral QHS [name], FNP   8 mg at 12/18/20 2145  . risperiDONE (RISPERDAL M-TABS) disintegrating tablet 5 mg  5 mg Oral QHS Claudie Revering, MD   5 mg at 12/18/20 2145  . senna (SENOKOT) tablet 8.6 mg  1 tablet Oral Daily [name], FNP   8.6 mg at 12/19/20 0813   PTA Medications: Medications Prior to Admission  Medication Sig Dispense Refill Last Dose  . benztropine (COGENTIN) 0.5 MG tablet Take 1 tablet (0.5 mg total) by mouth 2 (two) times daily. For EPS 60 tablet 0   . divalproex (DEPAKOTE) 500 MG DR tablet Take 2 tablets (1,000 mg total) by mouth 2 (two) times daily.     Marland Kitchen docusate sodium  (COLACE) 100 MG capsule Take 100 mg by mouth daily.     . hydrOXYzine (ATARAX/VISTARIL) 50 MG tablet Take 1 tablet (50 mg total) by mouth every 4 (four) hours as needed for anxiety (Sleep). 30 tablet 0   . polyethylene glycol (MIRALAX / GLYCOLAX) 17 g packet Take 17 g by mouth daily. 14 each 0   . ramelteon (ROZEREM) 8 MG tablet Take 1 tablet (8 mg total) by mouth at bedtime.     . risperiDONE (RISPERDAL M-TABS) 2 MG disintegrating tablet Take 1 tablet (2 mg total) by mouth 2 (two) times daily.       Patient Stressors: Health problems Medication change or noncompliance  Patient Strengths: Ability for insight Capable of independent living Motivation for treatment/growth Physical Health Supportive family/friends  Treatment Modalities: Medication Management, Group therapy, Case management,  1 to 1 session with clinician, Psychoeducation, Recreational therapy.   Physician Treatment Plan for Primary Diagnosis: MDD (major depressive disorder), recurrent episode, severe (HCC) Long Term Goal(s): Improvement in symptoms so as ready for discharge Improvement in symptoms so as ready for discharge   Short Term Goals: Ability to identify changes in lifestyle to reduce recurrence of condition will improve Ability to verbalize feelings will improve Ability to demonstrate self-control will improve Ability to identify and develop effective coping behaviors  will improve Ability to maintain clinical measurements within normal limits will improve Compliance with prescribed medications will improve Ability to identify changes in lifestyle to reduce recurrence of condition will improve Ability to verbalize feelings will improve Ability to demonstrate self-control will improve Ability to identify and develop effective coping behaviors will improve Ability to maintain clinical measurements within normal limits will improve Compliance with prescribed medications will improve  Medication Management:  Evaluate patient's response, side effects, and tolerance of medication regimen.  Therapeutic Interventions: 1 to 1 sessions, Unit Group sessions and Medication administration.  Evaluation of Outcomes: Progressing  Physician Treatment Plan for Secondary Diagnosis: Principal Problem:   MDD (major depressive disorder), recurrent episode, severe (HCC) Active Problems:   Schizoaffective disorder ([facility])  Long Term Goal(s): Improvement in symptoms so as ready for discharge Improvement in symptoms so as ready for discharge   Short Term Goals: Ability to identify changes in lifestyle to reduce recurrence of condition will improve Ability to verbalize feelings will improve Ability to demonstrate self-control will improve Ability to identify and develop effective coping behaviors will improve Ability to maintain clinical measurements within normal limits will improve Compliance with prescribed medications will improve Ability to identify changes in lifestyle to reduce recurrence of condition will improve Ability to verbalize feelings will improve Ability to demonstrate self-control will improve Ability to identify and develop effective coping behaviors will improve Ability to maintain clinical measurements within normal limits will improve Compliance with prescribed medications will improve     Medication Management: Evaluate patient's response, side effects, and tolerance of medication regimen.  Therapeutic Interventions: 1 to 1 sessions, Unit Group sessions and Medication administration.  Evaluation of Outcomes: Progressing   RN Treatment Plan for Primary Diagnosis: MDD (major depressive disorder), recurrent episode, severe (HCC) Long Term Goal(s): Knowledge of disease and therapeutic regimen to maintain health will improve  Short Term Goals: Ability to remain free from injury will improve, Ability to participate in decision making will improve and Ability to verbalize feelings will  improve  Medication Management: RN will administer medications as ordered by provider, will assess and evaluate patient's response and provide education to patient for prescribed medication. RN will report any adverse and/or side effects to prescribing provider.  Therapeutic Interventions: 1 on 1 counseling sessions, Psychoeducation, Medication administration, Evaluate responses to treatment, Monitor vital signs and CBGs as ordered, Perform/monitor CIWA, COWS, AIMS and Fall Risk screenings as ordered, Perform wound care treatments as ordered.  Evaluation of Outcomes: Progressing   LCSW Treatment Plan for Primary Diagnosis: MDD (major depressive disorder), recurrent episode, severe (HCC) Long Term Goal(s): Safe transition to appropriate next level of care at discharge, Engage patient in therapeutic group addressing interpersonal concerns.  Short Term Goals: Engage patient in aftercare planning with referrals and resources, Increase social support and Increase ability to appropriately verbalize feelings  Therapeutic Interventions: Assess for all discharge needs, 1 to 1 time with Social worker, Explore available resources and support systems, Assess for adequacy in community support network, Educate family and significant other(s) on suicide prevention, Complete Psychosocial Assessment, Interpersonal group therapy.  Evaluation of Outcomes: Progressing   Progress in Treatment: Attending groups: Yes. Participating in groups: Yes. Taking medication as prescribed: Yes. Toleration medication: Yes. Family/Significant other contact made: Yes, individual(s) contacted:  Brother  Patient understands diagnosis: Yes. Discussing patient identified problems/goals with staff: No. Medical problems stabilized or resolved: Yes. Denies suicidal/homicidal ideation: Yes. Issues/concerns per patient self-inventory: No. Other: None  New problem(s) identified: No, Describe:  None  New  Short Term/Long Term  Goal(s):medication stabilization, elimination of SI thoughts, development of comprehensive mental wellness plan.  Patient Goals:  Did not attend   Discharge Plan or Barriers:  Patient recently admitted. CSW will continue to follow and assess for appropriate referrals and possible discharge planning.  Reason for Continuation of Hospitalization: Delusions  Depression Medication stabilization  Estimated Length of Stay: 3-5 days  Attendees: Patient: Did not attend  12/19/2020   Physician: Clifton Custard, MD 12/19/2020   Nursing:  12/19/2020   RN Care Manager: 12/19/2020   Social Worker: Melba Coon, LCSWA 12/19/2020   Recreational Therapist:  12/19/2020   Other:  12/19/2020   Other:  12/19/2020   Other: 12/19/2020      Scribe for Treatment Team: Aram Beecham, LCSWA 12/19/2020 11:48 AM

## 2020-12-20 LAB — HEPATIC FUNCTION PANEL
ALT: 8 U/L (ref 0–44)
AST: 16 U/L (ref 15–41)
Albumin: 3.4 g/dL — ABNORMAL LOW (ref 3.5–5.0)
Alkaline Phosphatase: 54 U/L (ref 38–126)
Bilirubin, Direct: 0.2 mg/dL (ref 0.0–0.2)
Indirect Bilirubin: 0.5 mg/dL (ref 0.3–0.9)
Total Bilirubin: 0.7 mg/dL (ref 0.3–1.2)
Total Protein: 6 g/dL — ABNORMAL LOW (ref 6.5–8.1)

## 2020-12-20 LAB — CBC WITH DIFFERENTIAL/PLATELET
Abs Immature Granulocytes: 0.06 10*3/uL (ref 0.00–0.07)
Basophils Absolute: 0 10*3/uL (ref 0.0–0.1)
Basophils Relative: 1 %
Eosinophils Absolute: 0.1 10*3/uL (ref 0.0–0.5)
Eosinophils Relative: 1 %
HCT: 42.8 % (ref 39.0–52.0)
Hemoglobin: 13.9 g/dL (ref 13.0–17.0)
Immature Granulocytes: 1 %
Lymphocytes Relative: 35 %
Lymphs Abs: 2 10*3/uL (ref 0.7–4.0)
MCH: 29.8 pg (ref 26.0–34.0)
MCHC: 32.5 g/dL (ref 30.0–36.0)
MCV: 91.8 fL (ref 80.0–100.0)
Monocytes Absolute: 0.5 10*3/uL (ref 0.1–1.0)
Monocytes Relative: 9 %
Neutro Abs: 3 10*3/uL (ref 1.7–7.7)
Neutrophils Relative %: 53 %
Platelets: 191 10*3/uL (ref 150–400)
RBC: 4.66 MIL/uL (ref 4.22–5.81)
RDW: 14.8 % (ref 11.5–15.5)
WBC: 5.7 10*3/uL (ref 4.0–10.5)
nRBC: 0 % (ref 0.0–0.2)

## 2020-12-20 LAB — VALPROIC ACID LEVEL: Valproic Acid Lvl: 69 ug/mL (ref 50.0–100.0)

## 2020-12-20 MED ORDER — BENZTROPINE MESYLATE 0.5 MG PO TABS: 0.5000 mg | ORAL_TABLET | Freq: Four times a day (QID) | ORAL | Status: DC | PRN

## 2020-12-20 MED ORDER — DIPHENHYDRAMINE HCL 50 MG/ML IJ SOLN: 50.0000 mg | Freq: Four times a day (QID) | INTRAMUSCULAR | Status: DC | PRN

## 2020-12-20 MED ORDER — RISPERIDONE 2 MG PO TBDP
6.0000 mg | ORAL_TABLET | Freq: Every day | ORAL | Status: DC
  Administered 2020-12-20 – 2020-12-23 (×4): 6 mg via ORAL
  Filled 2020-12-20 (×7): qty 3

## 2020-12-20 NOTE — Progress Notes (Addendum)
   12/20/20 2100  Psych Admission Type (Psych Patients Only)  Admission Status Voluntary  Psychosocial Assessment  Patient Complaints Other (Comment) (tiredness from meds)  Eye Contact Brief  Facial Expression Flat  Affect Depressed;Flat  Speech Logical/coherent  Interaction Minimal;Guarded;Forwards little  Motor Activity Slow  Appearance/Hygiene Unremarkable  Behavior Characteristics Cooperative;Guarded  Mood Depressed  Thought Process  Coherency WDL  Content WDL  Delusions None reported or observed  Perception WDL  Hallucination None reported or observed  Judgment WDL  Confusion None  Danger to Self  Current suicidal ideation? Denies  Danger to Others  Danger to Others None reported or observed   Pt denies SI, HI, AVH and pain. Takes meds as prescribed. Says the medications are making him tired. Pt encouraged to speak to provider about this. Says he will go back to his home once discharged.

## 2020-12-20 NOTE — Progress Notes (Signed)
Adult Psychoeducational Group Note  Date:  12/20/2020 Time:  5:54 PM  Group Topic/Focus:  Making Healthy Choices:   The focus of this group is to help patients identify negative/unhealthy choices they were using prior to admission and identify positive/healthier coping strategies to replace them upon discharge.  Participation Level:  Did Not Attend Anthony Knox 12/20/2020, 5:54 PM

## 2020-12-20 NOTE — Progress Notes (Signed)
   12/20/20 0717  Vital Signs  Pulse Rate 69  BP 101/61  BP Location Left Arm  BP Method Automatic  Patient Position (if appropriate) Standing   D: Patient denies SH/HI/AVH. Patient rated anxiety 5/10 and depression 4/10. A:  Patient took scheduled medicine.  Support and encouragement provided Routine safety checks conducted every 15 minutes. Patient  Informed to notify staff with any concerns.   R:  Safety maintained.
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
## 2020-12-20 NOTE — Progress Notes (Signed)
Psychoeducational Group Note  Date:  12/20/2020 Time:  2015  Group Topic/Focus:  Wrap up group  Participation Level: Did Not Attend  Participation Quality:  Not Applicable  Affect:  Not Applicable  Cognitive:  Not Applicable  Insight:  Not Applicable  Engagement in Group: Not Applicable  Additional Comments:  Did not attend.   [name] 12/20/2020, 9:17 PM

## 2020-12-20 NOTE — Progress Notes (Signed)
Adult Psychoeducational Group Note  Date:  12/20/2020 Time:  1:45 PM  Group Topic/Focus:  Goals Group:   The focus of this group is to help patients establish daily goals to achieve during treatment and discuss how the patient can incorporate goal setting into their daily lives to aide in recovery.  Participation Level:  Did Not Attend  Anthony Knox 12/20/2020, 1:45 PM

## 2020-12-21 NOTE — Progress Notes (Signed)
Recreation Therapy Notes  Date:  6.10.22 Time: 0930 Location: [address] Dayroom  Group Topic: Stress Management  Goal Area(s) Addresses:  Patient will identify positive stress management techniques. Patient will identify benefits of using stress management post d/c.  Behavioral Response: Attentive  Intervention: Stress Management  Activity:  Meditation.  LRT played a meditation that focused on calming anxiety by allowing the breathing to relax you and also in the moment clear the mind of any worry or things out your control.    Education:  Stress Management, Discharge Planning.   Education Outcome: Acknowledges Education  Clinical Observations/Feedback: Pt attended and participated in the activity.  Pt had no questions or concerns.       Caroll Rancher, LRT/CTRS         Lillia Abed, Daleigh Pollinger A 12/21/2020 11:03 AM

## 2020-12-21 NOTE — Progress Notes (Signed)
   12/21/20 2050  Psych Admission Type (Psych Patients Only)  Admission Status Voluntary  Psychosocial Assessment  Patient Complaints None  Eye Contact Brief  Facial Expression Flat  Affect Appropriate to circumstance;Depressed  Speech Logical/coherent  Interaction Assertive  Motor Activity Slow  Appearance/Hygiene Layered clothes  Behavior Characteristics Appropriate to situation  Mood Pleasant  Thought Process  Coherency WDL  Content WDL  Delusions None reported or observed  Perception WDL  Hallucination None reported or observed  Judgment WDL  Confusion None  Danger to Self  Current suicidal ideation? Denies  Danger to Others  Danger to Others None reported or observed

## 2020-12-21 NOTE — Progress Notes (Signed)
   12/21/20 1500  Psych Admission Type (Psych Patients Only)  Admission Status Voluntary  Psychosocial Assessment  Patient Complaints None  Eye Contact Brief  Facial Expression Flat  Affect Depressed;Flat  Speech Logical/coherent  Interaction Minimal;Guarded;Forwards little  Motor Activity Slow  Appearance/Hygiene Unremarkable  Behavior Characteristics Cooperative  Mood Depressed  Thought Process  Coherency WDL  Content WDL  Delusions None reported or observed  Perception WDL  Hallucination None reported or observed  Judgment WDL  Confusion None  Danger to Self  Current suicidal ideation? Denies  Danger to Others  Danger to Others None reported or observed

## 2020-12-21 NOTE — Progress Notes (Signed)
Adult Psychoeducational Group Note  Date:  12/21/2020 Time:  11:11 AM  Group Topic/Focus:  Goals Group:   The focus of this group is to help patients establish daily goals to achieve during treatment and discuss how the patient can incorporate goal setting into their daily lives to aide in recovery.  Participation Level:  Minimal  Participation Quality:  Drowsy  Affect:  Flat  Cognitive:  Lacking  Insight: Limited  Engagement in Group:  Engaged  Modes of Intervention:  Discussion  Additional Comments:  Pt attended morning goals group however, Pt did not have a goal to share.   Deforest Hoyles Kataya [place] 12/21/2020, 11:11 AM

## 2020-12-21 NOTE — Progress Notes (Signed)
Patient did not attend wrap up group AA. 
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
## 2020-12-21 NOTE — Progress Notes (Signed)
Adult Psychoeducational Group Note  Date:  12/21/2020 Time:  12:42 PM  Group Topic/Focus:  Managing Feelings:   The focus of this group is to identify what feelings patients have difficulty handling and develop a plan to handle them in a healthier way upon discharge.  Participation Level:  Active  Participation Quality:  Appropriate  Affect:  Appropriate  Cognitive:  Alert and Appropriate  Insight: Appropriate and Good  Engagement in Group:  Engaged  Modes of Intervention:  Discussion  Additional Comments:  Pt was in the managing feelings meeting this morning, but pt did not participate in the discussion.   Anthony Knox 12/21/2020, 12:42 PM

## 2020-12-22 DIAGNOSIS — F333 Major depressive disorder, recurrent, severe with psychotic symptoms: Secondary | ICD-10-CM

## 2020-12-22 NOTE — Progress Notes (Signed)
Adult Psychoeducational Group Note  Date:  12/22/2020 Time:  9:00 PM  Group Topic/Focus:  Wrap-Up Group:   The focus of this group is to help patients review their daily goal of treatment and discuss progress on daily workbooks.  Participation Level:  Active  Participation Quality:  Attentive  Affect:  Appropriate  Cognitive:  Alert  Insight: Appropriate  Engagement in Group:  Supportive  Modes of Intervention:  Discussion  Additional Comments:  Pt articulated that he did not have a goal for today. Pt stated he did talk with staff about his care. Pt conveyed he made a no phone call and reported relationship with his family and support system was the same. Pt reported he took all medications provided and attended all meal services with 100% intake. Pt said his appetite was good today. Pt evaluated his sleep last night as good. Pt verbalized he felt good about himself and rated his overall day a 7  out of 10 on this date. Pt articulated he had no physical pain. Pt denies no auditory or visual hallucinations or thoughts of harming himself or others. Pt said he would alert staff if anything changed. End of Wrap-Up Group progress report   Nicoletta Dress 12/22/2020, 9:00 PM

## 2020-12-22 NOTE — Progress Notes (Signed)
Pt has been pleasant and cooperative this shift.  Pt has taken medications without incident.  Pt is outside with fellow pt's right now.  No immediate concerns are noted at this time.  Pt denies SI/HI/AVH.  RN will continue to monitor and provide support as needed.

## 2020-12-22 NOTE — Progress Notes (Addendum)
[facility] MD Progress Note  12/22/2020 5:55 PM Anthony Knox  MRN:  [id number]   Reason for admission: The patient is a 60 year old male with a reported past psychiatric history of schizophrenia who was admitted because "my brother felt I was not taking my mental health medication and brought me here."  Chart notes document that he was irritable, disorganized, tangential and paranoid on admission.  Objective: Medical record reviewed.  Patient's case discussed in detail with members of the treatment team.  I met with and evaluated the patient today for follow-up in his room.  The patient is cooperative with good eye contact during our conversation.  He was more conversant than on our previous interactions and I observed him going to the cafeteria for lunch.  He continues to report he is overmedicated and feels he does not need a mood stabilizer. He refused his Depakote and Rozeram last night. He stated "I am always peaceful until someone makes me mad."  He continues to make suspicious statements about the water and food in the hospital but states that he is eating and drinking adequately.  Patient denies passive wish for death, SI, AI, AH or VH.  He slept much of the day yesterday and hours of sleep recorded for last night were only 6.75 hours.  Patient stated he wants to go home. No new labs or medication changes today. Will continue to monitor.   Principal Problem: MDD (major depressive disorder), recurrent episode, severe ([place]) Diagnosis: Principal Problem:   MDD (major depressive disorder), recurrent episode, severe ([place]) Active Problems:   Schizoaffective disorder ([place])  Total Time spent with patient: 25 minutes  Past Psychiatric History: See admission H&P  Past Medical History:  Past Medical History:  Diagnosis Date   Schizophrenia ([place])    History reviewed. No pertinent surgical history. Family History: History reviewed. No pertinent family history. Family Psychiatric  History: See admission  H&P Social History:  Social History   Substance and Sexual Activity  Alcohol Use Not Currently     Social History   Substance and Sexual Activity  Drug Use Never    Social History   Socioeconomic History   Marital status: Single    Spouse name: Not on file   Number of children: Not on file   Years of education: Not on file   Highest education level: Not on file  Occupational History   Not on file  Tobacco Use   Smoking status: Never   Smokeless tobacco: Never  Vaping Use   Vaping Use: Never used  Substance and Sexual Activity   Alcohol use: Not Currently   Drug use: Never   Sexual activity: Not Currently  Other Topics Concern   Not on file  Social History Narrative   Not on file   Social Determinants of Health   Financial Resource Strain: Not on file  Food Insecurity: Not on file  Transportation Needs: Not on file  Physical Activity: Not on file  Stress: Not on file  Social Connections: Not on file   Additional Social History:       Sleep: Fair  Appetite:  Good  Current Medications: Current Facility-Administered Medications  Medication Dose Route Frequency Provider Last Rate Last Admin   acetaminophen (TYLENOL) tablet 650 mg  650 mg Oral Q6H PRN Damita Dunnings B, MD   650 mg at 12/18/20 0959   alum & mag hydroxide-simeth (MAALOX/MYLANTA) 200-200-20 MG/5ML suspension 30 mL  30 mL Oral Q4H PRN Freida Busman, MD  benztropine (COGENTIN) tablet 0.5 mg  0.5 mg Oral Q6H PRN Arthor Captain, MD       benztropine (COGENTIN) tablet 1 mg  1 mg Oral QHS Ethelene Hal, NP   1 mg at 12/21/20 2047   diphenhydrAMINE (BENADRYL) injection 50 mg  50 mg Intramuscular Q6H PRN Arthor Captain, MD       divalproex (DEPAKOTE ER) 24 hr tablet 1,250 mg  1,250 mg Oral QHS Onuoha, Josephine C, NP   1,250 mg at 12/20/20 2109   hydrOXYzine (ATARAX/VISTARIL) tablet 25 mg  25 mg Oral Q6H PRN Arthor Captain, MD   25 mg at 12/20/20 0759   magnesium hydroxide (MILK OF  MAGNESIA) suspension 30 mL  30 mL Oral Daily PRN Damita Dunnings B, MD       polyethylene glycol (MIRALAX / GLYCOLAX) packet 17 g  17 g Oral Daily PRN Sharma Covert, MD       ramelteon (ROZEREM) tablet 8 mg  8 mg Oral QHS Suella Broad, FNP   8 mg at 12/20/20 2108   risperiDONE (RISPERDAL M-TABS) disintegrating tablet 6 mg  6 mg Oral QHS Arthor Captain, MD   6 mg at 12/21/20 2047   senna (SENOKOT) tablet 8.6 mg  1 tablet Oral Daily Suella Broad, FNP   8.6 mg at 12/22/20 1125    Lab Results:  Results for orders placed or performed during the hospital encounter of 12/13/20 (from the past 48 hour(s))  Valproic acid level     Status: None   Collection Time: 12/20/20  6:18 PM  Result Value Ref Range   Valproic Acid Lvl 69 50.0 - 100.0 ug/mL    Comment: Performed at [facility], [place] [address] [place], [place] [address]  CBC with Differential/Platelet     Status: None   Collection Time: 12/20/20  6:18 PM  Result Value Ref Range   WBC 5.7 4.0 - 10.5 K/uL   RBC 4.66 4.22 - 5.81 MIL/uL   Hemoglobin 13.9 13.0 - 17.0 g/dL   HCT 42.8 39.0 - 52.0 %   MCV 91.8 80.0 - 100.0 fL   MCH 29.8 26.0 - 34.0 pg   MCHC 32.5 30.0 - 36.0 g/dL   RDW 14.8 11.5 - 15.5 %   Platelets 191 150 - 400 K/uL   nRBC 0.0 0.0 - 0.2 %   Neutrophils Relative % 53 %   Neutro Abs 3.0 1.7 - 7.7 K/uL   Lymphocytes Relative 35 %   Lymphs Abs 2.0 0.7 - 4.0 K/uL   Monocytes Relative 9 %   Monocytes Absolute 0.5 0.1 - 1.0 K/uL   Eosinophils Relative 1 %   Eosinophils Absolute 0.1 0.0 - 0.5 K/uL   Basophils Relative 1 %   Basophils Absolute 0.0 0.0 - 0.1 K/uL   Immature Granulocytes 1 %   Abs Immature Granulocytes 0.06 0.00 - 0.07 K/uL    Comment: Performed at [facility], [place] [address], [place], [place] [address]  Hepatic function panel     Status: Abnormal   Collection Time: 12/20/20  6:18 PM  Result Value Ref Range   Total Protein 6.0 (L) 6.5 - 8.1 g/dL    Albumin 3.4 (L) 3.5 - 5.0 g/dL   AST 16 15 - 41 U/L   ALT 8 0 - 44 U/L   Alkaline Phosphatase 54 38 - 126 U/L   Total Bilirubin 0.7 0.3 - 1.2 mg/dL   Bilirubin, Direct 0.2  0.0 - 0.2 mg/dL   Indirect Bilirubin 0.5 0.3 - 0.9 mg/dL    Comment: Performed at [facility], [place] [address] [place], [place] [address]    Blood Alcohol level:  Lab Results  Component Value Date   ETH <10 06/18/2018   ETH <10 30/16/0109    Metabolic Disorder Labs: Lab Results  Component Value Date   HGBA1C 5.9 (H) 12/07/2020   MPG 123 12/07/2020   MPG 122.63 05/08/2018   Lab Results  Component Value Date   PROLACTIN 34.1 (H) 05/08/2018   Lab Results  Component Value Date   CHOL 170 12/07/2020   TRIG 56 12/07/2020   HDL 32 (L) 12/07/2020   CHOLHDL 5.3 12/07/2020   VLDL 11 12/07/2020   LDLCALC 127 (H) 12/07/2020   LDLCALC 87 05/08/2018    Physical Findings: AIMS: Facial and Oral Movements Muscles of Facial Expression: None, normal Lips and Perioral Area: None, normal Jaw: None, normal Tongue: None, normal,Extremity Movements Upper (arms, wrists, hands, fingers): None, normal Lower (legs, knees, ankles, toes): None, normal, Trunk Movements Neck, shoulders, hips: None, normal, Overall Severity Severity of abnormal movements (highest score from questions above): None, normal Incapacitation due to abnormal movements: None, normal Patient's awareness of abnormal movements (rate only patient's report): No Awareness, Dental Status Current problems with teeth and/or dentures?: No Does patient usually wear dentures?: No  CIWA:    COWS:     Musculoskeletal: Strength & Muscle Tone: within normal limits Gait & Station: Not tested; patient reports he no longer feels off balance when he walks Patient leans: N/A  Psychiatric Specialty Exam:  Presentation  General Appearance: Appropriate for Environment; Fairly Groomed  Eye Contact:Good  Speech:Clear and Coherent; Normal  Rate  Speech Volume:Normal  Handedness:Right  Mood and Affect  Mood:Anxious  Affect:Constricted  Thought Process  Thought Processes:Coherent; Goal Directed  Descriptions of Associations:Intact  Orientation:Full (Time, Place and Person)  Thought Content:Paranoid Ideation  History of Schizophrenia/Schizoaffective disorder:Yes  Duration of Psychotic Symptoms:Greater than six months  Hallucinations:Hallucinations: None  Ideas of Reference:Paranoia  Suicidal Thoughts:Suicidal Thoughts: No  Homicidal Thoughts:Homicidal Thoughts: No  Sensorium  Memory:Immediate Fair; Recent Fair; Remote Fair  Judgment:Impaired  Insight:Shallow  Executive Functions  Concentration:Fair  Attention Span:Fair  [place]  Language:Good  Psychomotor Activity  Psychomotor Activity:Psychomotor Activity: Normal  Assets  Assets:Desire for Improvement; Resilience; Social Support  Sleep  Sleep:Sleep: Fair Number of Hours of Sleep: 6.75  Physical Exam: Physical Exam Vitals and nursing note reviewed.  Constitutional:      General: He is not in acute distress. HENT:     Head: Normocephalic and atraumatic.  Pulmonary:     Effort: Pulmonary effort is normal.  Neurological:     General: No focal deficit present.     Mental Status: He is alert.   Review of Systems  Constitutional: Negative.  Negative for diaphoresis and fever.  HENT: Negative.    Eyes: Negative.   Respiratory: Negative.    Cardiovascular: Negative.   Gastrointestinal: Negative.  Negative for nausea and vomiting.  Genitourinary: Negative.   Musculoskeletal: Negative.   Skin:  Negative for rash.  Neurological: Negative.  Negative for tremors and seizures.  Psychiatric/Behavioral:  Negative for hallucinations and suicidal ideas. The patient has insomnia.   Blood pressure 106/74, pulse 65, temperature (!) 97.4 F (36.3 C), temperature source Oral, resp. rate 18, height '5\' 7"'  (1.702 m),  weight 79.8 kg, SpO2 94 %. Body mass index is 27.57 kg/m.   Treatment  Plan Summary: Daily contact with patient to assess and evaluate symptoms and progress in treatment and Medication management   Continue every 15-minute observation status  Encouraged participation in group therapy and therapeutic milieu  Schizoaffective disorder, currently depressed -Continue risperidone 6 mg at bedtime.  Risperdal is being used for mood stabilization and psychotic symptoms will monitor for reemergence of psychotic symptoms.   -Continue Depakote 1250 ER at bedtime for mood stabilization.    EPS -Continue benztropine 1 mg at bedtime  Anxiety -Continue hydroxyzine 25 mg every 6 hours PRN anxiety or sleep  Insomnia -Continue Rozerem 8 mg at bedtime for sleep  Constipation -Continue MiraLAX PRN -Continue Senokot 8.6 mg daily  Discharge planning in progress.  Social work is trying to clarify whether patient has an ACT team and determine housing plan for patient.   Ethelene Hal, NP 12/22/2020, 5:49 PM   I have reviewed the note by NP, and discussed the plan of care.  I am in agreement with the assessment and plan.  Sleep is improving, mood improving, however patient has refused Depakote and Rozerem.  Brother is seeking guardianship.  Appreciate social work assistance in discharge planning.  Lavella Hammock, MD         Lavella Hammock, MD 12/22/2020, 5:56 PM

## 2020-12-22 NOTE — BHH Group Notes (Signed)
LCSW Group Therapy Note  12/22/2020   10:00-11:00am   Type of Therapy and Topic:  Group Therapy: Anger Cues and Responses  Participation Level:  Minimal   Description of Group:   In this group, patients learned how to recognize the physical, cognitive, emotional, and behavioral responses they have to anger-provoking situations.  They identified a recent time they became angry and how they reacted.  They analyzed how their reaction was possibly beneficial and how it was possibly unhelpful.  The group discussed a variety of healthier coping skills that could help with such a situation in the future.  Focus was placed on how helpful it is to recognize the underlying emotions to our anger, because working on those can lead to a more permanent solution as well as our ability to focus on the important rather than the urgent.  Therapeutic Goals: Patients will remember their last incident of anger and how they felt emotionally and physically, what their thoughts were at the time, and how they behaved. Patients will identify how their behavior at that time worked for them, as well as how it worked against them. Patients will explore possible new behaviors to use in future anger situations. Patients will learn that anger itself is normal and cannot be eliminated, and that healthier reactions can assist with resolving conflict rather than worsening situations.  Summary of Patient Progress:  The patient shared that his most recent time of anger was right before being hospitalized and said his brother was being judgmental because he is a perfectionist.  The patient at first spoke completely off topic, saying that he is "Kandis Ban about everything" and in this hospital will sleep at every time he is not in group.  He then talked about taking a medicine that he was allergic to that gave him a heart arrhythmia.  He had to be redirected, was pleasant, but did not contribute anything further to the group  discussion.  Therapeutic Modalities:   Cognitive Behavioral Therapy  [name]

## 2020-12-22 NOTE — Progress Notes (Signed)
Adult Psychoeducational Group Note  Date:  12/22/2020 Time:  10:51 AM  Group Topic/Focus:  Goals Group:   The focus of this group is to help patients establish daily goals to achieve during treatment and discuss how the patient can incorporate goal setting into their daily lives to aide in recovery.  Participation Level:  Did Not Attend  Deforest Hoyles [facility] 12/22/2020, 10:51 AM

## 2020-12-23 NOTE — Progress Notes (Signed)
[facility] MD Progress Note  12/23/2020 3:40 PM Anthony Knox  MRN:  [id number] Subjective:   Patient reports that he is doing well today. He states that he is willing to take his Depakote as long as it is all at night, he states if there is some during the day it makes him too tired. He reports that he slept well last night and his appetite has been good. He reports no SI, HI, or AVH. He reports that he plans to ask Social Work for information on getting an ACT team and to help him with housing. Encouraged him to get out bed to interact with others on the unit and attend group therapy to develop and refine coping skills. He reports no other concerns at present.  Principal Problem: MDD (major depressive disorder), recurrent episode, severe ([facility]) Diagnosis: Principal Problem:   MDD (major depressive disorder), recurrent episode, severe ([facility]) Active Problems:   Schizoaffective disorder ([facility])  Total Time spent with patient:   I personally spent 25 minutes on the unit in direct patient care. The direct patient care time included face-to-face time with the patient, reviewing the patient's chart, communicating with other professionals, and coordinating care. Greater than 50% of this time was spent in counseling or coordinating care with the patient regarding goals of hospitalization, psycho-education, and discharge planning needs.   Past Psychiatric History: Schizoaffective Disorder, MDD  Past Medical History:  Past Medical History:  Diagnosis Date   Schizophrenia ([facility])    History reviewed. No pertinent surgical history. Family History: History reviewed. No pertinent family history. Family Psychiatric  History: None Reported Social History:  Social History   Substance and Sexual Activity  Alcohol Use Not Currently     Social History   Substance and Sexual Activity  Drug Use Never    Social History   Socioeconomic History   Marital status: Single    Spouse name: Not on file   Number of  children: Not on file   Years of education: Not on file   Highest education level: Not on file  Occupational History   Not on file  Tobacco Use   Smoking status: Never   Smokeless tobacco: Never  Vaping Use   Vaping Use: Never used  Substance and Sexual Activity   Alcohol use: Not Currently   Drug use: Never   Sexual activity: Not Currently  Other Topics Concern   Not on file  Social History Narrative   Not on file   Social Determinants of Health   Financial Resource Strain: Not on file  Food Insecurity: Not on file  Transportation Needs: Not on file  Physical Activity: Not on file  Stress: Not on file  Social Connections: Not on file   Additional Social History:                         Sleep: Good  Appetite:  Good  Current Medications: Current Facility-Administered Medications  Medication Dose Route Frequency Provider Last Rate Last Admin   acetaminophen (TYLENOL) tablet 650 mg  650 mg Oral Q6H PRN Eliseo Gum B, MD   650 mg at 12/18/20 0959   alum & mag hydroxide-simeth (MAALOX/MYLANTA) 200-200-20 MG/5ML suspension 30 mL  30 mL Oral Q4H PRN Eliseo Gum B, MD       benztropine (COGENTIN) tablet 0.5 mg  0.5 mg Oral Q6H PRN Claudie Revering, MD       benztropine (COGENTIN) tablet 1 mg  1 mg Oral  QHS Laveda Abbe, NP   1 mg at 12/22/20 2149   diphenhydrAMINE (BENADRYL) injection 50 mg  50 mg Intramuscular Q6H PRN Claudie Revering, MD       divalproex (DEPAKOTE ER) 24 hr tablet 1,250 mg  1,250 mg Oral QHS Onuoha, Josephine C, NP   1,250 mg at 12/22/20 2149   hydrOXYzine (ATARAX/VISTARIL) tablet 25 mg  25 mg Oral Q6H PRN Claudie Revering, MD   25 mg at 12/20/20 0759   magnesium hydroxide (MILK OF MAGNESIA) suspension 30 mL  30 mL Oral Daily PRN Eliseo Gum B, MD       polyethylene glycol (MIRALAX / GLYCOLAX) packet 17 g  17 g Oral Daily PRN Antonieta Pert, MD       ramelteon (ROZEREM) tablet 8 mg  8 mg Oral QHS [name], FNP   8 mg at  12/22/20 2149   risperiDONE (RISPERDAL M-TABS) disintegrating tablet 6 mg  6 mg Oral QHS Claudie Revering, MD   6 mg at 12/22/20 2149   senna (SENOKOT) tablet 8.6 mg  1 tablet Oral Daily [name], FNP   8.6 mg at 12/22/20 1125    Lab Results: No results found for this or any previous visit (from the past 48 hour(s)).  Blood Alcohol level:  Lab Results  Component Value Date   ETH <10 06/18/2018   ETH <10 05/05/2018    Metabolic Disorder Labs: Lab Results  Component Value Date   HGBA1C 5.9 (H) 12/07/2020   MPG 123 12/07/2020   MPG 122.63 05/08/2018   Lab Results  Component Value Date   PROLACTIN 34.1 (H) 05/08/2018   Lab Results  Component Value Date   CHOL 170 12/07/2020   TRIG 56 12/07/2020   HDL 32 (L) 12/07/2020   CHOLHDL 5.3 12/07/2020   VLDL 11 12/07/2020   LDLCALC 127 (H) 12/07/2020   LDLCALC 87 05/08/2018    Physical Findings: AIMS: Facial and Oral Movements Muscles of Facial Expression: None, normal Lips and Perioral Area: None, normal Jaw: None, normal Tongue: None, normal,Extremity Movements Upper (arms, wrists, hands, fingers): None, normal Lower (legs, knees, ankles, toes): None, normal, Trunk Movements Neck, shoulders, hips: None, normal, Overall Severity Severity of abnormal movements (highest score from questions above): None, normal Incapacitation due to abnormal movements: None, normal Patient's awareness of abnormal movements (rate only patient's report): No Awareness, Dental Status Current problems with teeth and/or dentures?: No Does patient usually wear dentures?: No  CIWA:    COWS:     Musculoskeletal: Strength & Muscle Tone: within normal limits Gait & Station: normal Patient leans: N/A  Psychiatric Specialty Exam:  Presentation  General Appearance: Appropriate for Environment  Eye Contact:Good  Speech:Clear and Coherent; Normal Rate  Speech Volume:Normal  Handedness:Right   Mood and Affect   Mood:Depressed  Affect:Constricted   Thought Process  Thought Processes:Coherent; Goal Directed  Descriptions of Associations:Intact  Orientation:Full (Time, Place and Person)  Thought Content:WDL  History of Schizophrenia/Schizoaffective disorder:Yes  Duration of Psychotic Symptoms:Greater than six months  Hallucinations:Hallucinations: None Ideas of Reference:None  Suicidal Thoughts:Suicidal Thoughts: No Homicidal Thoughts:Homicidal Thoughts: No  Sensorium  Memory:Immediate Fair; Recent Fair  Judgment:Poor  Insight:Shallow   Executive Functions  Concentration:Fair  Attention Span:Fair  Recall:Fair  Fund of Knowledge:Fair  Language:Good   Psychomotor Activity  Psychomotor Activity: Psychomotor Activity: Normal  Assets  Assets:Desire for Improvement; Resilience   Sleep  Sleep:Sleep: Good Number of Hours of Sleep: 6.75    Physical Exam: Physical  Exam Vitals and nursing note reviewed.  Constitutional:      General: He is not in acute distress.    Appearance: Normal appearance. He is normal weight. He is not ill-appearing or toxic-appearing.  HENT:     Head: Normocephalic and atraumatic.  Cardiovascular:     Rate and Rhythm: Bradycardia present.  Pulmonary:     Effort: Pulmonary effort is normal.  Musculoskeletal:        General: Normal range of motion.  Neurological:     Mental Status: He is alert.   ROS Blood pressure 105/75, pulse (!) 52, temperature 97.8 F (36.6 C), temperature source Oral, resp. rate 18, height 5\' 7"  (1.702 m), weight 79.8 kg, SpO2 98 %. Body mass index is 27.57 kg/m.   Treatment Plan Summary: Daily contact with patient to assess and evaluate symptoms and progress in treatment  He is doing better with his Depakote in a single evening dose. Will not make any changes to medications at this time. Encouraged him to attend group therapy and engage more with other patients on the unit. Appreciate Social Work help in  ACT team and placement issues.   Schizophrenia: -Continue Risperdal 6 mg QHS -Continue Depakote ER 1250 mg QHS -Continue Cogentin 0.5 mg q6 PRN tremors and 1 mg QHS -Continue Benadryl IM 50 mg q6 PRN acute dystonic reaction   Constipation: -Continue Senna 8.6 mg daily   -Continue PRN's: Tylenol, Maalox, Atarax, Milk of Magnesia, Trazodone   Estimated Length of Stay: 2-4 days   , MD 12/23/2020, 3:40 PM

## 2020-12-23 NOTE — Progress Notes (Signed)
   12/23/20 2230  Psych Admission Type (Psych Patients Only)  Admission Status Voluntary  Psychosocial Assessment  Patient Complaints None  Eye Contact Fair  Facial Expression Animated  Affect Appropriate to circumstance  Speech Logical/coherent  Interaction Assertive  Motor Activity Other (Comment) (WDL)  Appearance/Hygiene Unremarkable  Behavior Characteristics Appropriate to situation  Mood Pleasant  Thought Process  Coherency WDL  Content WDL  Delusions None reported or observed  Perception WDL  Hallucination None reported or observed  Judgment Impaired  Confusion None  Danger to Self  Current suicidal ideation? Denies  Danger to Others  Danger to Others None reported or observed

## 2020-12-23 NOTE — [facility] Group Notes (Signed)
[facility] LCSW Group Therapy Note  12/23/2020    Type of Therapy and Topic:  Group Therapy:  A Hero Worthy of Support  Participation Level:  Minimal   Description of Group:  Patients in this group were introduced to the concept that additional supports including self-support are an essential part of recovery.  Matching needs with supports to help fulfill those needs was explained.  Establishing boundaries that can gradually be increased or decreased was described, with patients giving their own examples of establishing appropriate boundaries in their lives.  A song entitled "My Own Hero" was played and a group discussion ensued in which patients stated it inspired them to help themselves in order to succeed, because other people cannot achieve their goals such as sobriety or stability for them.  A song was played called "I Am Enough" which led to a discussion about being willing to believe we are worth the effort of being a self-support.   Therapeutic Goals: 1)  demonstrate the importance of being a key part of one's own support system 2)  discuss various available supports 3)  encourage patient to use music as part of their self-support and focus on goals 4)  elicit ideas from patients about supports that need to be added   Summary of Patient Progress:  The patient expressed that he needs an "outside enterprise" as a support when he leaves the hospital, since he has no family support.  He was silent but attentive throughout the remainder of group.  Therapeutic Modalities:   Motivational Interviewing Activity  [name]

## 2020-12-23 NOTE — Progress Notes (Signed)
   12/23/20 0025  Psych Admission Type (Psych Patients Only)  Admission Status Voluntary  Psychosocial Assessment  Patient Complaints Anxiety  Eye Contact Brief  Facial Expression Animated  Affect Appropriate to circumstance  Speech Logical/coherent  Interaction Assertive  Motor Activity Other (Comment) (WDL)  Appearance/Hygiene Unremarkable  Behavior Characteristics Appropriate to situation  Mood Pleasant  Thought Process  Coherency WDL  Content WDL  Delusions None reported or observed  Perception WDL  Hallucination None reported or observed  Judgment Impaired  Confusion None  Danger to Self  Current suicidal ideation? Denies  Danger to Others  Danger to Others None reported or observed

## 2020-12-23 NOTE — [facility] Group Notes (Signed)
The focus of this group is to help patients establish daily goals to achieve during treatment and discuss how the patient can incorporate goal setting into their daily lives to aide in recovery.  Pt did not attend group 

## 2020-12-23 NOTE — Progress Notes (Signed)
Pt refused morning Senna, stating that he was having "slightly loose" bowel movements but refused immodium.  He is guarded but denies any SI/HI/AVH.  He does not complain of any physical problems.   12/23/20 1000  Psych Admission Type (Psych Patients Only)  Admission Status Voluntary  Psychosocial Assessment  Patient Complaints Anxiety;Depression  Eye Contact Fair  Facial Expression Animated  Affect Appropriate to circumstance  Speech Logical/coherent  Interaction Assertive  Motor Activity Other (Comment) (WDL)  Appearance/Hygiene Unremarkable  Behavior Characteristics Appropriate to situation  Mood Pleasant  Thought Process  Coherency WDL  Content WDL  Delusions None reported or observed  Perception WDL  Hallucination None reported or observed  Judgment Impaired  Confusion None  Danger to Self  Current suicidal ideation? Denies  Danger to Others  Danger to Others None reported or observed      COVID-19 Daily Checkoff  Have you had a fever (temp > 37.80C/100F)  in the past 24 hours?  No  If you have had runny nose, nasal congestion, sneezing in the past 24 hours, has it worsened? No  COVID-19 EXPOSURE  Have you traveled outside the state in the past 14 days? No  Have you been in contact with someone with a confirmed diagnosis of COVID-19 or PUI in the past 14 days without wearing appropriate PPE? No  Have you been living in the same home as a person with confirmed diagnosis of COVID-19 or a PUI (household contact)? No  Have you been diagnosed with COVID-19? No

## 2020-12-24 DIAGNOSIS — F251 Schizoaffective disorder, depressive type: Principal | ICD-10-CM

## 2020-12-24 MED ORDER — BENZTROPINE MESYLATE 0.5 MG PO TABS
0.5000 mg | ORAL_TABLET | Freq: Every day | ORAL | Status: DC
  Administered 2020-12-24 – 2021-01-02 (×10): 0.5 mg via ORAL
  Filled 2020-12-24 (×13): qty 1

## 2020-12-24 MED ORDER — BENZTROPINE MESYLATE 0.5 MG PO TABS
0.5000 mg | ORAL_TABLET | Freq: Every day | ORAL | Status: DC
  Administered 2020-12-25 – 2021-01-03 (×10): 0.5 mg via ORAL
  Filled 2020-12-24 (×12): qty 1

## 2020-12-24 MED ORDER — RISPERIDONE 2 MG PO TBDP
3.0000 mg | ORAL_TABLET | Freq: Two times a day (BID) | ORAL | Status: DC
  Administered 2020-12-24 – 2020-12-25 (×2): 3 mg via ORAL
  Filled 2020-12-24 (×6): qty 1

## 2020-12-24 NOTE — [facility] Counselor (Signed)
CSW sent in a referral for ACTT Services to [facility] [phone].  Burdette and his brother may follow up on this referral to establish services.

## 2020-12-24 NOTE — Progress Notes (Signed)
The patient's positive event for the day is that he laughed more today. His goal for tomorrow is to continue to think positive.

## 2020-12-24 NOTE — Tx Team (Signed)
Interdisciplinary Treatment and Diagnostic Plan Update  12/24/2020 Time of Session: 1:20pm Anthony Knox MRN: [id number]  Principal Diagnosis: MDD (major depressive disorder), recurrent episode, severe (HCC)  Secondary Diagnoses: Principal Problem:   MDD (major depressive disorder), recurrent episode, severe (HCC) Active Problems:   Schizoaffective disorder (HCC)   Current Medications:  Current Facility-Administered Medications  Medication Dose Route Frequency Provider Last Rate Last Admin   acetaminophen (TYLENOL) tablet 650 mg  650 mg Oral Q6H PRN Eliseo Gum B, MD   650 mg at 12/18/20 0959   alum & mag hydroxide-simeth (MAALOX/MYLANTA) 200-200-20 MG/5ML suspension 30 mL  30 mL Oral Q4H PRN Eliseo Gum B, MD       benztropine (COGENTIN) tablet 0.5 mg  0.5 mg Oral Q6H PRN Claudie Revering, MD       benztropine (COGENTIN) tablet 1 mg  1 mg Oral QHS Laveda Abbe, NP   1 mg at 12/23/20 2102   diphenhydrAMINE (BENADRYL) injection 50 mg  50 mg Intramuscular Q6H PRN Claudie Revering, MD       divalproex (DEPAKOTE ER) 24 hr tablet 1,250 mg  1,250 mg Oral QHS Onuoha, Josephine C, NP   1,250 mg at 12/23/20 2102   hydrOXYzine (ATARAX/VISTARIL) tablet 25 mg  25 mg Oral Q6H PRN Claudie Revering, MD   25 mg at 12/20/20 0759   magnesium hydroxide (MILK OF MAGNESIA) suspension 30 mL  30 mL Oral Daily PRN Eliseo Gum B, MD       polyethylene glycol (MIRALAX / GLYCOLAX) packet 17 g  17 g Oral Daily PRN Antonieta Pert, MD       ramelteon (ROZEREM) tablet 8 mg  8 mg Oral QHS [name], FNP   8 mg at 12/23/20 2102   risperiDONE (RISPERDAL M-TABS) disintegrating tablet 6 mg  6 mg Oral QHS Claudie Revering, MD   6 mg at 12/23/20 2102   senna (SENOKOT) tablet 8.6 mg  1 tablet Oral Daily [name], FNP   8.6 mg at 12/24/20 8127   PTA Medications: Medications Prior to Admission  Medication Sig Dispense Refill Last Dose   benztropine (COGENTIN) 0.5 MG tablet Take 1  tablet (0.5 mg total) by mouth 2 (two) times daily. For EPS 60 tablet 0    divalproex (DEPAKOTE) 500 MG DR tablet Take 2 tablets (1,000 mg total) by mouth 2 (two) times daily.      docusate sodium (COLACE) 100 MG capsule Take 100 mg by mouth daily.      hydrOXYzine (ATARAX/VISTARIL) 50 MG tablet Take 1 tablet (50 mg total) by mouth every 4 (four) hours as needed for anxiety (Sleep). 30 tablet 0    polyethylene glycol (MIRALAX / GLYCOLAX) 17 g packet Take 17 g by mouth daily. 14 each 0    ramelteon (ROZEREM) 8 MG tablet Take 1 tablet (8 mg total) by mouth at bedtime.      risperiDONE (RISPERDAL M-TABS) 2 MG disintegrating tablet Take 1 tablet (2 mg total) by mouth 2 (two) times daily.       Patient Stressors: Health problems Medication change or noncompliance  Patient Strengths: Ability for insight Capable of independent living Motivation for treatment/growth Physical Health Supportive family/friends  Treatment Modalities: Medication Management, Group therapy, Case management,  1 to 1 session with clinician, Psychoeducation, Recreational therapy.   Physician Treatment Plan for Primary Diagnosis: MDD (major depressive disorder), recurrent episode, severe (HCC) Long Term Goal(s): Improvement in symptoms so as ready for discharge Improvement  in symptoms so as ready for discharge   Short Term Goals: Ability to identify changes in lifestyle to reduce recurrence of condition will improve Ability to verbalize feelings will improve Ability to demonstrate self-control will improve Ability to identify and develop effective coping behaviors will improve Ability to maintain clinical measurements within normal limits will improve Compliance with prescribed medications will improve Ability to identify changes in lifestyle to reduce recurrence of condition will improve Ability to verbalize feelings will improve Ability to demonstrate self-control will improve Ability to identify and develop  effective coping behaviors will improve Ability to maintain clinical measurements within normal limits will improve Compliance with prescribed medications will improve  Medication Management: Evaluate patient's response, side effects, and tolerance of medication regimen.  Therapeutic Interventions: 1 to 1 sessions, Unit Group sessions and Medication administration.  Evaluation of Outcomes: Progressing  Physician Treatment Plan for Secondary Diagnosis: Principal Problem:   MDD (major depressive disorder), recurrent episode, severe (HCC) Active Problems:   Schizoaffective disorder (HCC)  Long Term Goal(s): Improvement in symptoms so as ready for discharge Improvement in symptoms so as ready for discharge   Short Term Goals: Ability to identify changes in lifestyle to reduce recurrence of condition will improve Ability to verbalize feelings will improve Ability to demonstrate self-control will improve Ability to identify and develop effective coping behaviors will improve Ability to maintain clinical measurements within normal limits will improve Compliance with prescribed medications will improve Ability to identify changes in lifestyle to reduce recurrence of condition will improve Ability to verbalize feelings will improve Ability to demonstrate self-control will improve Ability to identify and develop effective coping behaviors will improve Ability to maintain clinical measurements within normal limits will improve Compliance with prescribed medications will improve     Medication Management: Evaluate patient's response, side effects, and tolerance of medication regimen.  Therapeutic Interventions: 1 to 1 sessions, Unit Group sessions and Medication administration.  Evaluation of Outcomes: Progressing   RN Treatment Plan for Primary Diagnosis: MDD (major depressive disorder), recurrent episode, severe (HCC) Long Term Goal(s): Knowledge of disease and therapeutic regimen to  maintain health will improve  Short Term Goals: Ability to remain free from injury will improve, Ability to participate in decision making will improve and Ability to verbalize feelings will improve  Medication Management: RN will administer medications as ordered by provider, will assess and evaluate patient's response and provide education to patient for prescribed medication. RN will report any adverse and/or side effects to prescribing provider.  Therapeutic Interventions: 1 on 1 counseling sessions, Psychoeducation, Medication administration, Evaluate responses to treatment, Monitor vital signs and CBGs as ordered, Perform/monitor CIWA, COWS, AIMS and Fall Risk screenings as ordered, Perform wound care treatments as ordered.  Evaluation of Outcomes: Progressing   LCSW Treatment Plan for Primary Diagnosis: MDD (major depressive disorder), recurrent episode, severe (HCC) Long Term Goal(s): Safe transition to appropriate next level of care at discharge, Engage patient in therapeutic group addressing interpersonal concerns.  Short Term Goals: Engage patient in aftercare planning with referrals and resources, Increase social support and Increase ability to appropriately verbalize feelings  Therapeutic Interventions: Assess for all discharge needs, 1 to 1 time with Social worker, Explore available resources and support systems, Assess for adequacy in community support network, Educate family and significant other(s) on suicide prevention, Complete Psychosocial Assessment, Interpersonal group therapy.  Evaluation of Outcomes: Progressing   Progress in Treatment: Attending groups: Yes. Participating in groups: Yes. Taking medication as prescribed: Yes. Toleration medication: Yes. Family/Significant other contact  made: Yes, individual(s) contacted:  Brother  Patient understands diagnosis: Yes. Discussing patient identified problems/goals with staff: No. Medical problems stabilized or  resolved: Yes. Denies suicidal/homicidal ideation: Yes. Issues/concerns per patient self-inventory: No. Other: None  New problem(s) identified: No, Describe:  None  New Short Term/Long Term Goal(s):medication stabilization, elimination of SI thoughts, development of comprehensive mental wellness plan.  Patient Goals:  Did not attend   Discharge Plan or Barriers:  Patient is to return to apartment and has been referred for ACT services.   Reason for Continuation of Hospitalization: Depression Medication stabilization  Estimated Length of Stay: 1-3 days  Attendees: Patient:  12/24/2020   Physician:  12/24/2020   Nursing:  12/24/2020   RN Care Manager: 12/24/2020   Social Worker: Ruthann Cancer 12/24/2020   Recreational Therapist:  12/24/2020   Other:  12/24/2020   Other:  12/24/2020   Other: 12/24/2020       Scribe for Treatment Team: Otelia Santee, LCSW 12/24/2020 3:03 PM

## 2020-12-24 NOTE — Progress Notes (Signed)
Pt stated he was doing better. Pt given HS medications without incident    12/24/20 2300  Psych Admission Type (Psych Patients Only)  Admission Status Voluntary  Psychosocial Assessment  Patient Complaints None  Eye Contact Fair  Facial Expression Animated  Affect Appropriate to circumstance  Speech Logical/coherent  Interaction Assertive  Motor Activity Other (Comment) (WDL)  Appearance/Hygiene Unremarkable  Behavior Characteristics Cooperative  Mood Pleasant  Thought Process  Coherency WDL  Content WDL  Delusions None reported or observed  Perception WDL  Hallucination None reported or observed  Judgment Impaired  Confusion None  Danger to Self  Current suicidal ideation? Denies  Danger to Others  Danger to Others None reported or observed

## 2020-12-24 NOTE — Progress Notes (Signed)
[facility] MD Progress Note  12/24/2020 5:33 PM Anthony Knox  MRN:  [id number]   Reason for admission: The patient is a 60 year old male with a reported past psychiatric history of schizophrenia who was admitted because "my brother felt I was not taking my mental health medication and brought me here."  Chart notes document that he was irritable, disorganized, tangential and paranoid on admission.  Objective: Medical record reviewed.  Patient's case discussed in detail with members of the treatment team.  I met with and evaluated the patient today for follow-up on the unit.  The patient states that he feels he is on too much medication at night.  He reports that after he takes the risperidone he feels "agitated".  He denies SI, HI, AI, PI, AH or VH.  He reports that he is eating and sleeping okay.  Reports experiencing leg cramps but denies other physical problems or possible medication side effects.  He is receptive to dividing risperidone to 32m twice daily and also taking a morning dose of Cogentin in addition to the dose at bedtime.  Patient states he does not currently have an ACT team but had one in the past.  [facility] [place] [facility] [place] [facility] [place] [facility] [place]  Patient says he has not spoken to his brother recently and says he believes his brother is filing paperwork to get guardianship in order to get patient's money.  The patient's presentation is about the same as it has been on previous meetings.  He maintains good eye contact.  Affect is constricted.  Thought processes are coherent and goal-directed.  It is unclear whether his statements about his brother are entirely based in reality or possibly paranoid to some degree.  Otherwise no other psychotic content is elicited today.  The patient slept 6.75 hours last night.  Patient refused vital signs this morning.  Principal Problem: MDD (major depressive disorder), recurrent episode, severe ([place] Diagnosis: Principal Problem:   MDD (major  depressive disorder), recurrent episode, severe ([place] Active Problems:   Schizoaffective disorder ([place]  Total Time spent with patient: 20 minutes  Past Psychiatric History: See admission H&P  Past Medical History:  Past Medical History:  Diagnosis Date   Schizophrenia ([place]    History reviewed. No pertinent surgical history. Family History: History reviewed. No pertinent family history. Family Psychiatric  History: See admission H&P Social History:  Social History   Substance and Sexual Activity  Alcohol Use Not Currently     Social History   Substance and Sexual Activity  Drug Use Never    Social History   Socioeconomic History   Marital status: Single    Spouse name: Not on file   Number of children: Not on file   Years of education: Not on file   Highest education level: Not on file  Occupational History   Not on file  Tobacco Use   Smoking status: Never   Smokeless tobacco: Never  Vaping Use   Vaping Use: Never used  Substance and Sexual Activity   Alcohol use: Not Currently   Drug use: Never   Sexual activity: Not Currently  Other Topics Concern   Not on file  Social History Narrative   Not on file   Social Determinants of Health   Financial Resource Strain: Not on file  Food Insecurity: Not on file  Transportation Needs: Not on file  Physical Activity: Not on file  Stress: Not on file  Social Connections: Not on file  Additional Social History:                         Sleep: Fair  Appetite:  Good  Current Medications: Current Facility-Administered Medications  Medication Dose Route Frequency Provider Last Rate Last Admin   acetaminophen (TYLENOL) tablet 650 mg  650 mg Oral Q6H PRN Damita Dunnings B, MD   650 mg at 12/18/20 0959   alum & mag hydroxide-simeth (MAALOX/MYLANTA) 200-200-20 MG/5ML suspension 30 mL  30 mL Oral Q4H PRN Damita Dunnings B, MD       benztropine (COGENTIN) tablet 0.5 mg  0.5 mg Oral Q6H PRN Arthor Captain, MD        [START ON 12/25/2020] benztropine (COGENTIN) tablet 0.5 mg  0.5 mg Oral Daily Arthor Captain, MD       benztropine (COGENTIN) tablet 1 mg  1 mg Oral QHS Ethelene Hal, NP   1 mg at 12/23/20 2102   diphenhydrAMINE (BENADRYL) injection 50 mg  50 mg Intramuscular Q6H PRN Arthor Captain, MD       divalproex (DEPAKOTE ER) 24 hr tablet 1,250 mg  1,250 mg Oral QHS Onuoha, Josephine C, NP   1,250 mg at 12/23/20 2102   hydrOXYzine (ATARAX/VISTARIL) tablet 25 mg  25 mg Oral Q6H PRN Arthor Captain, MD   25 mg at 12/20/20 0759   magnesium hydroxide (MILK OF MAGNESIA) suspension 30 mL  30 mL Oral Daily PRN Damita Dunnings B, MD       polyethylene glycol (MIRALAX / GLYCOLAX) packet 17 g  17 g Oral Daily PRN Sharma Covert, MD       ramelteon (ROZEREM) tablet 8 mg  8 mg Oral QHS Suella Broad, FNP   8 mg at 12/23/20 2102   risperiDONE (RISPERDAL M-TABS) disintegrating tablet 3 mg  3 mg Oral BID Arthor Captain, MD       senna Donavan Burnet) tablet 8.6 mg  1 tablet Oral Daily Suella Broad, FNP   8.6 mg at 12/24/20 3825    Lab Results:  No results found for this or any previous visit (from the past 5 hour(s)).   Blood Alcohol level:  Lab Results  Component Value Date   ETH <10 06/18/2018   ETH <10 05/39/7673    Metabolic Disorder Labs: Lab Results  Component Value Date   HGBA1C 5.9 (H) 12/07/2020   MPG 123 12/07/2020   MPG 122.63 05/08/2018   Lab Results  Component Value Date   PROLACTIN 34.1 (H) 05/08/2018   Lab Results  Component Value Date   CHOL 170 12/07/2020   TRIG 56 12/07/2020   HDL 32 (L) 12/07/2020   CHOLHDL 5.3 12/07/2020   VLDL 11 12/07/2020   LDLCALC 127 (H) 12/07/2020   LDLCALC 87 05/08/2018    Physical Findings: AIMS: Facial and Oral Movements Muscles of Facial Expression: None, normal Lips and Perioral Area: None, normal Jaw: None, normal Tongue: None, normal,Extremity Movements Upper (arms, wrists, hands, fingers): None,  normal Lower (legs, knees, ankles, toes): None, normal, Trunk Movements Neck, shoulders, hips: None, normal, Overall Severity Severity of abnormal movements (highest score from questions above): None, normal Incapacitation due to abnormal movements: None, normal Patient's awareness of abnormal movements (rate only patient's report): No Awareness, Dental Status Current problems with teeth and/or dentures?: No Does patient usually wear dentures?: No  CIWA:    COWS:     Musculoskeletal: Strength & Muscle Tone: within normal limits Gait &  Station: Not tested; patient reports he no longer feels off balance when he walks Patient leans: N/A  Psychiatric Specialty Exam:  Presentation  General Appearance: Appropriate for Environment  Eye Contact:Good  Speech:Clear and Coherent; Normal Rate  Speech Volume:Normal  Handedness:Right   Mood and Affect  Mood:Irritable (Dysthymic)  Affect:Constricted   Thought Process  Thought Processes:Coherent; Goal Directed  Descriptions of Associations:Intact  Orientation:Full (Time, Place and Person)  Thought Content:Logical  History of Schizophrenia/Schizoaffective disorder:Yes  Duration of Psychotic Symptoms:Greater than six months  Hallucinations:Hallucinations: None  Ideas of Reference:None  Suicidal Thoughts:Suicidal Thoughts: No  Homicidal Thoughts:Homicidal Thoughts: No   Sensorium  Memory:Immediate Fair; Recent Fair  Judgment:Poor  Insight:Shallow   Executive Functions  Concentration:Fair  Attention Span:Fair  [place]  Language:Good   Psychomotor Activity  Psychomotor Activity:Psychomotor Activity: Normal   Assets  Assets:Desire for Improvement; Resilience   Sleep  Sleep:Sleep: Good Number of Hours of Sleep: 6.75    Physical Exam: Physical Exam Vitals and nursing note reviewed.  Constitutional:      General: He is not in acute distress. HENT:     Head:  Normocephalic and atraumatic.  Pulmonary:     Effort: Pulmonary effort is normal.  Neurological:     General: No focal deficit present.     Mental Status: He is alert.   Review of Systems  Constitutional: Negative.  Negative for diaphoresis and fever.  HENT: Negative.    Eyes: Negative.   Respiratory: Negative.    Cardiovascular: Negative.   Gastrointestinal: Negative.  Negative for nausea and vomiting.  Genitourinary: Negative.   Musculoskeletal:  Positive for myalgias.       Positive for leg cramps  Skin:  Negative for rash.  Neurological: Negative.  Negative for tremors and seizures.  Psychiatric/Behavioral:  Negative for depression, hallucinations and suicidal ideas. The patient does not have insomnia.    Patient refused vital signs this morning Blood pressure 105/75, pulse (!) 52, temperature 97.8 F (36.6 C), temperature source Oral, resp. rate 18, height '5\' 7"'  (1.702 m), weight 79.8 kg, SpO2 98 %. Body mass index is 27.57 kg/m.   Treatment Plan Summary: Daily contact with patient to assess and evaluate symptoms and progress in treatment and Medication management   Continue every 15-minute observation status  Encouraged participation in group therapy and therapeutic milieu  Schizoaffective disorder, currently depressed -Divide risperidone to 3 mg twice daily.  Risperdal is being used for mood stabilization and psychotic symptoms will monitor for reemergence of psychotic symptoms.   -Continue Depakote 1250 ER at bedtime for mood stabilization.  -Change Cogentin to 0.5 mg each morning and 0.5 mg at bedtime for EPS  Anxiety -Continue hydroxyzine 25 mg every 6 hours PRN anxiety or sleep  Insomnia -Continue Rozerem 8 mg at bedtime for sleep  Constipation -Continue MiraLAX PRN -Continue Senokot 8.6 mg daily  Discharge planning in progress.  Social work is trying to clarify whether patient has an ACT team and determine housing plan for patient.   Arthor Captain,  MD 12/24/2020, 5:33 PM

## 2020-12-24 NOTE — Progress Notes (Signed)
Patient did not attend morning goals and activity group. 

## 2020-12-24 NOTE — [facility] Group Notes (Signed)
Occupational Therapy Group Note Date: 12/24/2020 Group Topic/Focus: Strengths Exploration  Group Description: Group encouraged increased participation and engagement through discussion focused on STRENGTHS. Patients were encouraged to fill out a worksheet to structure discussion, that included identifying strengths as it relates to one's relationships, profession, and personal fulfillment. Discussion followed with patients sharing their responses and highlighting their own personal strengths.  Therapeutic Goals: Identify strengths vs weaknesses Discuss and identify ways we can highlight our strengths  Participation Level: Patient did not attend OT group session despite personal invitation.    Plan: Continue to engage patient in OT groups 2 - 3x/week.  12/24/2020  Satya Buttram, MOT, OTR/L 

## 2020-12-24 NOTE — Progress Notes (Signed)
Recreation Therapy Notes  Date:  6.13.22 Time: 0930 Location: [address] Dayroom  Group Topic: Stress Management  Goal Area(s) Addresses:  Patient will identify positive stress management techniques. Patient will identify benefits of using stress management post d/c.  Intervention: Stress Managment  Activity :  Meditation.  LRT played a meditation that focused on setting boundaries for your own self care. Patients were to listen and follow as meditation was read to fully engage in activity.  Education:  Stress Management, Discharge Planning.   Education Outcome: Acknowledges Education  Clinical Observations/Feedback:  Pt did not attend group session.    Talaysia Pinheiro, LRT/CTRS         Nadine Ryle A 12/24/2020 11:31 AM 

## 2020-12-24 NOTE — Progress Notes (Signed)
   12/24/20 0800  Psych Admission Type (Psych Patients Only)  Admission Status Voluntary  Psychosocial Assessment  Patient Complaints None  Eye Contact Fair  Facial Expression Animated  Affect Appropriate to circumstance  Speech Logical/coherent  Interaction Assertive  Motor Activity Other (Comment) (WDL)  Appearance/Hygiene Unremarkable  Behavior Characteristics Appropriate to situation  Mood Pleasant  Thought Process  Coherency WDL  Content WDL  Delusions None reported or observed  Perception WDL  Hallucination None reported or observed  Judgment Impaired  Confusion None  Danger to Self  Current suicidal ideation? Denies  Danger to Others  Danger to Others None reported or observed  [place] NOVEL CORONAVIRUS (COVID-19) DAILY CHECK-OFF SYMPTOMS - answer yes or no to each - every day NO YES  Have you had a fever in the past 24 hours?  Fever (Temp > 37.80C / 100F) X   Have you had any of these symptoms in the past 24 hours? New Cough  Sore Throat   Shortness of Breath  Difficulty Breathing  Unexplained Body Aches   X   Have you had any one of these symptoms in the past 24 hours not related to allergies?   Runny Nose  Nasal Congestion  Sneezing   X   If you have had runny nose, nasal congestion, sneezing in the past 24 hours, has it worsened?  X   EXPOSURES - check yes or no X   Have you traveled outside the state in the past 14 days?  X   Have you been in contact with someone with a confirmed diagnosis of COVID-19 or PUI in the past 14 days without wearing appropriate PPE?  X   Have you been living in the same home as a person with confirmed diagnosis of COVID-19 or a PUI (household contact)?    X   Have you been diagnosed with COVID-19?    X              What to do next: Answered NO to all: Answered YES to anything:   Proceed with unit schedule Follow the [facility] Inpatient Flowsheet.

## 2020-12-25 MED ORDER — RISPERIDONE 2 MG PO TBDP
5.0000 mg | ORAL_TABLET | Freq: Every day | ORAL | Status: DC
  Administered 2020-12-26 – 2021-01-01 (×7): 5 mg via ORAL
  Filled 2020-12-25 (×10): qty 1

## 2020-12-25 MED ORDER — RISPERIDONE 2 MG PO TBDP
3.0000 mg | ORAL_TABLET | Freq: Every day | ORAL | Status: AC
  Administered 2020-12-25: 3 mg via ORAL
  Filled 2020-12-25: qty 1

## 2020-12-25 NOTE — Progress Notes (Signed)
Adult Psychoeducational Group Note  Date:  12/25/2020 Time:  2:51 PM  Group Topic/Focus:  Goals Group:   The focus of this group is to help patients establish daily goals to achieve during treatment and discuss how the patient can incorporate goal setting into their daily lives to aide in recovery.  Participation Level:  Minimal  Participation Quality:  Appropriate  Affect:  Flat  Cognitive:  Appropriate  Insight: Limited  Engagement in Group:  Engaged  Modes of Intervention:  Discussion  Additional Comments:  Pt attended morning goals group.Pt had no goal to share.   Deforest Hoyles Mounir Skipper 12/25/2020, 2:51 PM

## 2020-12-25 NOTE — Progress Notes (Signed)
[facility] MD Progress Note  12/25/2020 5:12 PM Anthony Knox  MRN:  [id number]   Reason for admission: The patient is a 60 year old male with a reported past psychiatric history of schizophrenia who was admitted because "my brother felt I was not taking my mental health medication and brought me here."  Chart notes document that he was irritable, disorganized, tangential and paranoid on admission.  Objective: Medical record reviewed.  Patient's case discussed in detail with members of the treatment team.  I met with and evaluated the patient today for follow-up on the unit.  Patient presents with stable but constricted affect and coherent thought processes.  He reports feeling tired during the day from his morning medication and has been napping.  He experienced middle insomnia last night.  His appetite is okay.  He states his mood is okay and he denies depressed mood, passive wish for death, SI, AI, AH, VH or PI.  He denies other medication side effects.  He denies other physical problems.  I discussed with patient where he will live and how he will get to his appointments after discharge.  Patient states that he currently has no driver's license and will need to get that and get his car fixed so that he has transportation after discharge.  We discussed involving his brother in helping patient get to the store, pharmacy and appointments, etc, after discharge until he is set up with an ACT team.  Patient was ambivalent about this idea.  I advised him to undergo a trial of [place] long-acting injectable antipsychotic.  Patient denies long-acting injectable antipsychotic and wishes to continue on oral medications.  I made contact with cardiology consult today to determine follow-up plans for patient.  I was advised that patient needs to call the [facility] health medical group cardiology department at [phone] after discharge to schedule an appointment with cardiology.  The patient slept 6.75 hours last night.   No new labs today.  He is taking scheduled medications as prescribed.  He has not required any PRN medications.  Principal Problem: MDD (major depressive disorder), recurrent episode, severe ([place]) Diagnosis: Principal Problem:   MDD (major depressive disorder), recurrent episode, severe ([place]) Active Problems:   Schizoaffective disorder ([place])  Total Time spent with patient: 20 minutes  Past Psychiatric History: See admission H&P  Past Medical History:  Past Medical History:  Diagnosis Date   Schizophrenia ([place])    History reviewed. No pertinent surgical history. Family History: History reviewed. No pertinent family history. Family Psychiatric  History: See admission H&P Social History:  Social History   Substance and Sexual Activity  Alcohol Use Not Currently     Social History   Substance and Sexual Activity  Drug Use Never    Social History   Socioeconomic History   Marital status: Single    Spouse name: Not on file   Number of children: Not on file   Years of education: Not on file   Highest education level: Not on file  Occupational History   Not on file  Tobacco Use   Smoking status: Never   Smokeless tobacco: Never  Vaping Use   Vaping Use: Never used  Substance and Sexual Activity   Alcohol use: Not Currently   Drug use: Never   Sexual activity: Not Currently  Other Topics Concern   Not on file  Social History Narrative   Not on file   Social Determinants of Health   Financial Resource Strain: Not on file  Food Insecurity: Not on file  Transportation Needs: Not on file  Physical Activity: Not on file  Stress: Not on file  Social Connections: Not on file   Additional Social History:                         Sleep: Fair  Appetite:  Good  Current Medications: Current Facility-Administered Medications  Medication Dose Route Frequency Provider Last Rate Last Admin   acetaminophen (TYLENOL) tablet 650 mg  650 mg Oral Q6H PRN Damita Dunnings B, MD   650 mg at 12/18/20 0959   alum & mag hydroxide-simeth (MAALOX/MYLANTA) 200-200-20 MG/5ML suspension 30 mL  30 mL Oral Q4H PRN Damita Dunnings B, MD       benztropine (COGENTIN) tablet 0.5 mg  0.5 mg Oral Q6H PRN Arthor Captain, MD       benztropine (COGENTIN) tablet 0.5 mg  0.5 mg Oral Daily Arthor Captain, MD   0.5 mg at 12/25/20 0805   benztropine (COGENTIN) tablet 0.5 mg  0.5 mg Oral QHS Arthor Captain, MD   0.5 mg at 12/24/20 2130   diphenhydrAMINE (BENADRYL) injection 50 mg  50 mg Intramuscular Q6H PRN Arthor Captain, MD       divalproex (DEPAKOTE ER) 24 hr tablet 1,250 mg  1,250 mg Oral QHS On[place], Josephine C, NP   1,250 mg at 12/24/20 2130   hydrOXYzine (ATARAX/VISTARIL) tablet 25 mg  25 mg Oral Q6H PRN Arthor Captain, MD   25 mg at 12/20/20 0759   magnesium hydroxide (MILK OF MAGNESIA) suspension 30 mL  30 mL Oral Daily PRN Damita Dunnings B, MD       polyethylene glycol (MIRALAX / GLYCOLAX) packet 17 g  17 g Oral Daily PRN Sharma Covert, MD       ramelteon (ROZEREM) tablet 8 mg  8 mg Oral QHS Suella Broad, FNP   8 mg at 12/24/20 2130   risperiDONE (RISPERDAL M-TABS) disintegrating tablet 3 mg  3 mg Oral QHS Arthor Captain, MD       [START ON 12/26/2020] risperiDONE (RISPERDAL M-TABS) disintegrating tablet 5 mg  5 mg Oral QHS Arthor Captain, MD       senna (SENOKOT) tablet 8.6 mg  1 tablet Oral Daily Suella Broad, FNP   8.6 mg at 12/25/20 0805    Lab Results:  No results found for this or any previous visit (from the past 76 hour(s)).   Blood Alcohol level:  Lab Results  Component Value Date   ETH <10 06/18/2018   ETH <10 94/70/9628    Metabolic Disorder Labs: Lab Results  Component Value Date   HGBA1C 5.9 (H) 12/07/2020   MPG 123 12/07/2020   MPG 122.63 05/08/2018   Lab Results  Component Value Date   PROLACTIN 34.1 (H) 05/08/2018   Lab Results  Component Value Date   CHOL 170 12/07/2020   TRIG 56 12/07/2020   HDL 32 (L)  12/07/2020   CHOLHDL 5.3 12/07/2020   VLDL 11 12/07/2020   LDLCALC 127 (H) 12/07/2020   LDLCALC 87 05/08/2018    Physical Findings: AIMS: Facial and Oral Movements Muscles of Facial Expression: None, normal Lips and Perioral Area: None, normal Jaw: None, normal Tongue: None, normal,Extremity Movements Upper (arms, wrists, hands, fingers): None, normal Lower (legs, knees, ankles, toes): None, normal, Trunk Movements Neck, shoulders, hips: None, normal, Overall Severity Severity of abnormal movements (highest score from questions above):  None, normal Incapacitation due to abnormal movements: None, normal Patient's awareness of abnormal movements (rate only patient's report): No Awareness, Dental Status Current problems with teeth and/or dentures?: No Does patient usually wear dentures?: No  CIWA:    COWS:     Musculoskeletal: Strength & Muscle Tone: within normal limits Gait & Station: Not tested; patient reports he no longer feels off balance when he walks Patient leans: N/A  Psychiatric Specialty Exam:  Presentation  General Appearance: Appropriate for Environment  Eye Contact:Good  Speech:Clear and Coherent; Normal Rate  Speech Volume:Normal  Handedness:Right   Mood and Affect  Mood:Euthymic  Affect:Constricted   Thought Process  Thought Processes:Coherent; Goal Directed  Descriptions of Associations:Intact  Orientation:Full (Time, Place and Person)  Thought Content:Logical  History of Schizophrenia/Schizoaffective disorder:Yes  Duration of Psychotic Symptoms:Greater than six months  Hallucinations:Hallucinations: None  Ideas of Reference:None  Suicidal Thoughts:Suicidal Thoughts: No  Homicidal Thoughts:Homicidal Thoughts: No   Sensorium  Memory:Immediate Fair; Recent Fair  Judgment:Fair  Insight:Shallow   Executive Functions  Concentration:Fair  Attention Span:Fair  [place]  Language:Good   Psychomotor Activity  Psychomotor Activity:Psychomotor Activity: Normal   Assets  Assets:Desire for Improvement; Housing; Resilience; Social Support   Sleep  Sleep:Sleep: Good Number of Hours of Sleep: 6.75    Physical Exam: Physical Exam Vitals and nursing note reviewed.  Constitutional:      General: He is not in acute distress. HENT:     Head: Normocephalic and atraumatic.  Pulmonary:     Effort: Pulmonary effort is normal.  Neurological:     General: No focal deficit present.     Mental Status: He is alert.   Review of Systems  Constitutional: Negative.  Negative for diaphoresis and fever.  HENT: Negative.    Eyes: Negative.   Respiratory: Negative.    Cardiovascular: Negative.   Gastrointestinal: Negative.  Negative for nausea and vomiting.  Genitourinary: Negative.   Musculoskeletal: Negative.   Skin:  Negative for rash.  Neurological: Negative.  Negative for tremors and seizures.  Psychiatric/Behavioral:  Negative for depression, hallucinations and suicidal ideas. The patient does not have insomnia.    Patient refused vital signs this morning Blood pressure 93/70, pulse 60, temperature 97.9 F (36.6 C), temperature source Oral, resp. rate 18, height '5\' 7"'  (1.702 m), weight 79.8 kg, SpO2 98 %. Body mass index is 27.57 kg/m.   Treatment Plan Summary: Daily contact with patient to assess and evaluate symptoms and progress in treatment and Medication management   Continue every 15-minute observation status  Encouraged participation in group therapy and therapeutic milieu  Schizoaffective disorder, currently depressed -Continue Risperdal 3 mg twice daily today.  We will consolidate Risperdal starting tomorrow 12/26/2020 to 5 mg at bedtime.  Risperdal is being used for mood stabilization and psychotic symptoms will monitor for reemergence of psychotic symptoms.   -Continue Depakote 1250 ER at bedtime for mood stabilization.   -Change Cogentin to 0.5 mg each morning and 0.5 mg at bedtime for EPS  Anxiety -Continue hydroxyzine 25 mg every 6 hours PRN anxiety or sleep  Insomnia -Continue Rozerem 8 mg at bedtime for sleep  Constipation -Continue MiraLAX PRN -Continue Senokot 8.6 mg daily  Cardiology outpatient follow-up -Spoke with cardiology consultant today 12/25/2020 and was advised that patient needs to call the [facility] health medical group cardiology department at [phone] after discharge to schedule an appointment with cardiology.    Discharge planning in progress.  Social work is trying to clarify whether patient  has an ACT team and determine housing plan for patient.  Social work is also attempting to connect with patient's brother.   Arthor Captain, MD 12/25/2020, 5:12 PM

## 2020-12-25 NOTE — [facility] Group Notes (Signed)
Patient did not attend group   ADULT GRIEF GROUP NOTE:   Spiritual care group on grief and loss facilitated by chaplain Katy Shermaine Rivet, [facility]   Group Goal:   Support / Education around grief and loss   Members engage in facilitated group support and psycho-social education.   Group Description:   Following introductions and group rules, group members engaged in facilitated group dialog and support around topic of loss, with particular support around experiences of loss in their lives. Group Identified types of loss (relationships / self / things) and identified patterns, circumstances, and changes that precipitate losses. Reflected on thoughts / feelings around loss, normalized grief responses, and recognized variety in grief experience. Group noted Worden's four tasks of grief in discussion.   Group drew on Adlerian / Rogerian, narrative, MI,    

## 2020-12-25 NOTE — Progress Notes (Signed)
Recreation Therapy Notes  Animal-Assisted Activity (AAA) Program Checklist/Progress Notes Patient Eligibility Criteria Checklist & Daily Group note for Rec Tx Intervention  Date: 6.14.22 Time: 1430 Location: [address]   AAA/T Program Assumption of Risk Form signed by Engineer, production or Parent Legal Guardian YES  Patient is free of allergies or severe asthma YES   Patient reports no fear of animals YES   Patient reports no history of cruelty to animals YES  Patient understands his/her participation is voluntary YES  Patient washes hands before animal contact YES  Patient washes hands after animal contact YES   Behavioral Response: Engaged  Education: Charity fundraiser, Appropriate Animal Interaction   Education Outcome: Acknowledges understanding/In group clarification offered/Needs additional education.   Clinical Observations/Feedback: Pt attended and participated in activity.      Caroll Rancher, LRT/CTRS        Caroll Rancher A 12/25/2020 3:24 PM

## 2020-12-25 NOTE — Progress Notes (Signed)
   12/25/20 1819  Psych Admission Type (Psych Patients Only)  Admission Status Voluntary  Psychosocial Assessment  Patient Complaints Anxiety;Depression  Eye Contact Fair  Facial Expression Animated  Affect Appropriate to circumstance  Speech Logical/coherent  Interaction Assertive  Motor Activity Other (Comment) (WDL)  Appearance/Hygiene Unremarkable  Behavior Characteristics Cooperative  Mood Pleasant  Thought Process  Coherency WDL  Content WDL  Delusions None reported or observed  Perception WDL  Hallucination None reported or observed  Judgment Impaired  Confusion None  Danger to Self  Current suicidal ideation? Denies  Danger to Others  Danger to Others None reported or observed

## 2020-12-25 NOTE — Progress Notes (Signed)
Adult Psychoeducational Group Note  Date:  12/25/2020 Time:  4:54 PM  Group Topic/Focus:  Healthy Communication:   The focus of this group is to discuss communication, barriers to communication, as well as healthy ways to communicate with others.  Participation Level:  Active  Participation Quality:  Appropriate  Affect:  Appropriate  Cognitive:  Alert and Appropriate  Insight: Appropriate  Engagement in Group:  Limited  Modes of Intervention:  Discussion  Additional Comments: Pt attended the second group of the day on healthy communication.   Deforest Hoyles Kepler Mccabe 12/25/2020, 4:54 PM

## 2020-12-26 NOTE — Progress Notes (Signed)
Pt rated his anxiety and depression a 0 tonight on a scale of 0-10 (10 being the worse). Pt said that he plans on returning home upon discharge. He said that he is still working on calming his breathing and relaxing. Pt has been attending and participating in group. He denies any side effects from his medications. He denies any paranoia or delusions. Pt denies SI/HI and AVH. Active listening, reassurance, and support provided. Medications administered as ordered by provider. Q 15 min safety checks continue. Pt's safety has been maintained.   12/25/20 2121  Psych Admission Type (Psych Patients Only)  Admission Status Voluntary  Psychosocial Assessment  Patient Complaints Anxiety  Eye Contact Fair  Facial Expression Animated  Affect Appropriate to circumstance  Speech Logical/coherent  Interaction Minimal;Forwards little  Motor Activity Other (Comment) (steady)  Appearance/Hygiene Unremarkable  Behavior Characteristics Cooperative;Appropriate to situation  Mood Anxious;Pleasant  Thought Process  Coherency WDL  Content WDL  Delusions None reported or observed  Perception WDL  Hallucination None reported or observed  Judgment Impaired  Confusion None  Danger to Self  Current suicidal ideation? Denies  Danger to Others  Danger to Others None reported or observed

## 2020-12-26 NOTE — [facility] Counselor (Signed)
CSW spoke with [employer] [phone] and was informed by Amy that that they have received the paperwork from the doctor and are working to see if Mr. Burridge qualifies for services and which services would be best for the patient. Amy states that they may also look at getting [facility] involved in the case.  Amy states that she will contact CSW at a later time to discuss the progress with the case. CSW will contact [facility] again this week to check on the progress.

## 2020-12-26 NOTE — Progress Notes (Signed)
[facility] MD Progress Note  12/26/2020 4:53 PM Anthony Knox  MRN:  [id number]   Reason for admission: The patient is a 60 year old male with a reported past psychiatric history of schizophrenia who was admitted because "my brother felt I was not taking my mental health medication and brought me here."  Chart notes document that he was irritable, disorganized, tangential and paranoid on admission.  Objective: Medical record reviewed.  Patient's case discussed in detail with members of the treatment team.  I met with and evaluated the patient today for follow-up on the unit.  Patient continues to present as improved but superficial.  He displays stable constricted affect and coherent thought processes with paucity of thought content offered.  No overt paranoid or psychotic thought content elicited today.  Patient denies depressed mood or anhedonia.  He denies AH, VH, PI, SI or AI.  He reports that he is eating and sleeping okay.  He denies any side effects to his medication or any physical problems.  Patient reports his energy is better today since his risperidone has been consolidated to single bedtime dose.  He continues to display poor insight into his psychiatric condition.  He is looking forward to discharge but has no current transportation and does not know how he would obtain food, medications or get to appointments.  He feels the relationship with his brother is strained and he is reluctant to have him involved in his care.  He is receptive working with an ACT team and I discussed that social work is attempting to connect him with one.  The patient slept 6.75 hours last night.  Principal Problem: Schizoaffective disorder ([place]) Diagnosis: Principal Problem:   Schizoaffective disorder ([place]) Active Problems:   MDD (major depressive disorder), recurrent episode, severe ([place])  Total Time spent with patient: 20 minutes  Past Psychiatric History: See admission H&P  Past Medical History:  Past Medical  History:  Diagnosis Date   Schizophrenia ([place])    History reviewed. No pertinent surgical history. Family History: History reviewed. No pertinent family history. Family Psychiatric  History: See admission H&P Social History:  Social History   Substance and Sexual Activity  Alcohol Use Not Currently     Social History   Substance and Sexual Activity  Drug Use Never    Social History   Socioeconomic History   Marital status: Single    Spouse name: Not on file   Number of children: Not on file   Years of education: Not on file   Highest education level: Not on file  Occupational History   Not on file  Tobacco Use   Smoking status: Never   Smokeless tobacco: Never  Vaping Use   Vaping Use: Never used  Substance and Sexual Activity   Alcohol use: Not Currently   Drug use: Never   Sexual activity: Not Currently  Other Topics Concern   Not on file  Social History Narrative   Not on file   Social Determinants of Health   Financial Resource Strain: Not on file  Food Insecurity: Not on file  Transportation Needs: Not on file  Physical Activity: Not on file  Stress: Not on file  Social Connections: Not on file   Additional Social History:                         Sleep: Good  Appetite:  Good  Current Medications: Current Facility-Administered Medications  Medication Dose Route Frequency Provider Last Rate Last  Admin   acetaminophen (TYLENOL) tablet 650 mg  650 mg Oral Q6H PRN Damita Dunnings B, MD   650 mg at 12/18/20 0959   alum & mag hydroxide-simeth (MAALOX/MYLANTA) 200-200-20 MG/5ML suspension 30 mL  30 mL Oral Q4H PRN Damita Dunnings B, MD       benztropine (COGENTIN) tablet 0.5 mg  0.5 mg Oral Q6H PRN Arthor Captain, MD       benztropine (COGENTIN) tablet 0.5 mg  0.5 mg Oral Daily Arthor Captain, MD   0.5 mg at 12/26/20 0802   benztropine (COGENTIN) tablet 0.5 mg  0.5 mg Oral QHS Arthor Captain, MD   0.5 mg at 12/25/20 2121   diphenhydrAMINE  (BENADRYL) injection 50 mg  50 mg Intramuscular Q6H PRN Arthor Captain, MD       divalproex (DEPAKOTE ER) 24 hr tablet 1,250 mg  1,250 mg Oral QHS Onuoha, Josephine C, NP   1,250 mg at 12/25/20 2121   hydrOXYzine (ATARAX/VISTARIL) tablet 25 mg  25 mg Oral Q6H PRN Arthor Captain, MD   25 mg at 12/20/20 0759   magnesium hydroxide (MILK OF MAGNESIA) suspension 30 mL  30 mL Oral Daily PRN Damita Dunnings B, MD       polyethylene glycol (MIRALAX / GLYCOLAX) packet 17 g  17 g Oral Daily PRN Sharma Covert, MD       ramelteon (ROZEREM) tablet 8 mg  8 mg Oral QHS Suella Broad, FNP   8 mg at 12/25/20 2121   risperiDONE (RISPERDAL M-TABS) disintegrating tablet 5 mg  5 mg Oral QHS Arthor Captain, MD       senna Donavan Burnet) tablet 8.6 mg  1 tablet Oral Daily Suella Broad, FNP   8.6 mg at 12/26/20 7829    Lab Results:  No results found for this or any previous visit (from the past 56 hour(s)).   Blood Alcohol level:  Lab Results  Component Value Date   ETH <10 06/18/2018   ETH <10 56/21/3086    Metabolic Disorder Labs: Lab Results  Component Value Date   HGBA1C 5.9 (H) 12/07/2020   MPG 123 12/07/2020   MPG 122.63 05/08/2018   Lab Results  Component Value Date   PROLACTIN 34.1 (H) 05/08/2018   Lab Results  Component Value Date   CHOL 170 12/07/2020   TRIG 56 12/07/2020   HDL 32 (L) 12/07/2020   CHOLHDL 5.3 12/07/2020   VLDL 11 12/07/2020   LDLCALC 127 (H) 12/07/2020   LDLCALC 87 05/08/2018    Physical Findings: AIMS: Facial and Oral Movements Muscles of Facial Expression: None, normal Lips and Perioral Area: None, normal Jaw: None, normal Tongue: None, normal,Extremity Movements Upper (arms, wrists, hands, fingers): None, normal Lower (legs, knees, ankles, toes): None, normal, Trunk Movements Neck, shoulders, hips: None, normal, Overall Severity Severity of abnormal movements (highest score from questions above): None, normal Incapacitation due to  abnormal movements: None, normal Patient's awareness of abnormal movements (rate only patient's report): No Awareness, Dental Status Current problems with teeth and/or dentures?: No Does patient usually wear dentures?: No  CIWA:    COWS:     Musculoskeletal: Strength & Muscle Tone: within normal limits Gait & Station: Not tested; patient reports he no longer feels off balance when he walks Patient leans: N/A  Psychiatric Specialty Exam:  Presentation  General Appearance: Appropriate for Environment  Eye Contact:Good  Speech:Clear and Coherent; Normal Rate  Speech Volume:Normal  Handedness:Right   Mood and Affect  Mood:Euthymic  Affect:Constricted   Thought Process  Thought Processes:Coherent; Goal Directed  Descriptions of Associations:Intact  Orientation:Full (Time, Place and Person)  Thought Content:Logical  History of Schizophrenia/Schizoaffective disorder:Yes  Duration of Psychotic Symptoms:Greater than six months  Hallucinations:Hallucinations: None  Ideas of Reference:None  Suicidal Thoughts:Suicidal Thoughts: No  Homicidal Thoughts:Homicidal Thoughts: No   Sensorium  Memory:Immediate Fair; Recent Fair  Judgment:Fair  Insight:Shallow   Executive Functions  Concentration:Fair  Attention Span:Fair  [place]  Language:Good   Psychomotor Activity  Psychomotor Activity:Psychomotor Activity: Normal   Assets  Assets:Desire for Improvement; Housing; Resilience; Social Support   Sleep  Sleep:Sleep: Good Number of Hours of Sleep: 6.75    Physical Exam: Physical Exam Vitals and nursing note reviewed.  Constitutional:      General: He is not in acute distress. HENT:     Head: Normocephalic and atraumatic.  Pulmonary:     Effort: Pulmonary effort is normal.  Neurological:     General: No focal deficit present.     Mental Status: He is alert.   Review of Systems  Constitutional: Negative.   Negative for diaphoresis and fever.  HENT: Negative.    Eyes: Negative.   Respiratory: Negative.    Cardiovascular: Negative.   Gastrointestinal: Negative.  Negative for nausea and vomiting.  Genitourinary: Negative.   Musculoskeletal: Negative.   Skin:  Negative for rash.  Neurological: Negative.  Negative for tremors and seizures.  Psychiatric/Behavioral:  Negative for depression, hallucinations and suicidal ideas. The patient does not have insomnia.    Patient refused vital signs this morning Blood pressure 101/76, pulse (!) 54, temperature (!) 97.5 F (36.4 C), temperature source Oral, resp. rate 18, height _0  (1.702 m), weight 79.8 kg, SpO2 98 %. Body mass index is 27.57 kg/m.   Treatment Plan Summary: Daily contact with patient to assess and evaluate symptoms and progress in treatment and Medication management   Continue every 15-minute observation status  Encouraged participation in group therapy and therapeutic milieu  Schizoaffective disorder, currently depressed -Consolidate Risperdal to single dose of 5 mg at bedtime.  Risperdal is being used for mood stabilization and psychotic symptoms will monitor for reemergence of psychotic symptoms.   -Continue Depakote 1250 ER at bedtime for mood stabilization.  -Continue Cogentin 0.5 mg each morning and 0.5 mg at bedtime for EPS  Anxiety -Continue hydroxyzine 25 mg every 6 hours PRN anxiety or sleep  Insomnia -Continue Rozerem 8 mg at bedtime for sleep  Constipation -Continue MiraLAX PRN -Continue Senokot 8.6 mg daily  Cardiology outpatient follow-up -Spoke with cardiology consultant on 12/25/2020 and was advised that patient needs to call the [facility] cardiology department at [phone] after discharge to schedule an appointment with cardiology.    Discharge planning in progress.  Social work is trying to clarify whether patient has an ACT team and determine housing plan for patient.  Social work is  also attempting to connect with patient's brother.   Arthor Captain, MD 12/26/2020, 4:53 PM

## 2020-12-26 NOTE — Plan of Care (Signed)
  Problem: Safety: Goal: Ability to disclose and discuss suicidal ideas will improve Outcome: Progressing Goal: Ability to identify and utilize support systems that promote safety will improve Outcome: Progressing   Problem: Self-Concept: Goal: Will verbalize positive feelings about self Outcome: Progressing   

## 2020-12-26 NOTE — BHH Counselor (Signed)
CSW contacted [facility] [employer] [phone] and filed a report.  CSW will be informed by mail if the case is accepted and a worker is assigned.

## 2020-12-26 NOTE — Progress Notes (Signed)
Progress note    12/26/20 0802  Psych Admission Type (Psych Patients Only)  Admission Status Voluntary  Psychosocial Assessment  Patient Complaints None  Eye Contact Fair  Facial Expression Animated  Affect Appropriate to circumstance  Speech Logical/coherent  Interaction Assertive  Motor Activity Slow  Appearance/Hygiene Unremarkable  Behavior Characteristics Cooperative;Appropriate to situation  Mood Pleasant  Thought Process  Coherency WDL  Content WDL  Delusions WDL  Perception WDL  Hallucination None reported or observed  Judgment WDL  Confusion None  Danger to Self  Current suicidal ideation? Denies  Danger to Others  Danger to Others None reported or observed

## 2020-12-26 NOTE — Progress Notes (Signed)
Recreation Therapy Notes  Date: 6.15.22 Time: 0930 Location: [address] Dayroom  Group Topic: Stress Management   Goal Area(s) Addresses:  Patient will actively participate in stress management techniques presented during session.  Patient will successfully identify benefit of practicing stress management post d/c.   Intervention: Guided exercise with ambient sound and script  Activity :Guided Imagery  LRT provided education, instruction, and demonstration on practice of visualization via guided imagery. Patient was asked to participate in the technique introduced during session. LRT also debriefed including topics of mindfulness, stress management and specific scenarios each patient could use these techniques. Patients were given suggestions of ways to access scripts post d/c and encouraged to explore Youtube and other apps available on smartphones, tablets, and computers.   Education:  Stress Management, Discharge Planning.   Education Outcome: Acknowledges education  Clinical Observations/Feedback: Patient did not attend group activity.     Caroll Rancher, LRT/CTRS         Caroll Rancher A 12/26/2020 11:36 AM

## 2020-12-26 NOTE — BHH Counselor (Signed)
CSW spoke with Anthony Knox [phone] (Brother) who states that the court date for guardianship is one July 1st.  He states that he is very upset that his brother is being released before that court date and had hoped that the hospital could keep him until after the court date.  Mr. Bjelland states that he will not be involved in his brother's care anymore and will not be coming to the house to check on his medications or give him rides to appointments.  Mr. Colee states that he will be attending the court date to get guardianship to make sure that his brother gets placed into a facility.  Mr. Dority states that his brother has been calling him from the hospital and curing at him. Mr. Buchta states that he does not believe that APS or the ACTT services that have been referred will be helpful either.  CSW encouraged Mr. Purohit to continue with guardianship and to work with APS and the ACTT team to help his brother even if he did not want to be directly involved.

## 2020-12-26 NOTE — Progress Notes (Signed)
Psychoeducational Group Note  Date:  12/26/2020 Time:  2114  Group Topic/Focus:  Wrap-Up Group:   The focus of this group is to help patients review their daily goal of treatment and discuss progress on daily workbooks.  Participation Level: Did Not Attend  Participation Quality:  Not Applicable  Affect:  Not Applicable  Cognitive:  Not Applicable  Insight:  Not Applicable  Engagement in Group: Not Applicable  Additional Comments:  The patient did not attend group this evening.   Hazle Coca S 12/26/2020, 9:14 PM

## 2020-12-27 NOTE — BHH Counselor (Addendum)
CSW was informed by staff that the APS worker came to the hospital, unannounced, to speak with the CSW but the CSW was not informed that a meeting would be taking place today.  CSW was not available to meet with the APS worker during that time.  APS worker left her phone number for the CSW.  The CSW attempted to contact the APS worker at this number but again did not receive any answer and is unable to leave a message due to the mailbox being full.  CSW will continue to attempt to contact the APS worker by phone and will explain that in order for the CSW to be present and organized for future meetings the APS will need to provide the CSW with notice (day and time) that a meeting is taking place.

## 2020-12-27 NOTE — Progress Notes (Signed)
   12/26/20 2118  Psych Admission Type (Psych Patients Only)  Admission Status Voluntary  Psychosocial Assessment  Patient Complaints None  Eye Contact Fair  Facial Expression Animated  Affect Appropriate to circumstance  Speech Logical/coherent  Interaction Assertive  Motor Activity Slow  Appearance/Hygiene Unremarkable  Behavior Characteristics Cooperative;Appropriate to situation  Mood Pleasant  Thought Process  Coherency WDL  Content WDL  Delusions None reported or observed  Perception WDL  Hallucination None reported or observed  Judgment WDL  Confusion None  Danger to Self  Current suicidal ideation? Denies  Danger to Others  Danger to Others None reported or observed

## 2020-12-27 NOTE — Progress Notes (Signed)
Progress note    12/27/20 0755  Psych Admission Type (Psych Patients Only)  Admission Status Voluntary  Psychosocial Assessment  Patient Complaints None  Eye Contact Fair  Facial Expression Animated  Affect Appropriate to circumstance  Speech Logical/coherent  Interaction Assertive  Motor Activity Slow  Appearance/Hygiene Unremarkable  Behavior Characteristics Cooperative;Appropriate to situation  Mood Pleasant  Thought Process  Coherency WDL  Content WDL  Delusions None reported or observed  Perception WDL  Hallucination None reported or observed  Judgment WDL  Confusion WDL  Danger to Self  Current suicidal ideation? Denies  Danger to Others  Danger to Others None reported or observed

## 2020-12-27 NOTE — B[facility] Counselor (Signed)
CSW sent in referrals for [facility] for [employer] and possible meals on wheels support.  CSW also sent in a referral to [facility] and [employer] for transportation to the grocery store and to any other required services that the patient may need transportation for.  The patient may follow up with these services after discharge.

## 2020-12-27 NOTE — [facility] Group Notes (Signed)
[facility] Group Notes: (Nursing/MHT/Case Management/Adjunct)   Date:  12/27/2020  Time:  9:00 AM   Type of Therapy:  Goals group   Participation Level:  Did Not Attend   Participation Quality:     Affect:     Cognitive:     Insight:     Engagement in Group:     Modes of Intervention:  Discussion and Education   Summary of Progress/Problems:  Did not attend despite staff encouragement.     Arend Bahl V Carreen Milius 12/27/2020 9:00 AM 

## 2020-12-27 NOTE — Plan of Care (Signed)
  Problem: Coping: Goal: Coping ability will improve Outcome: Progressing Goal: Will verbalize feelings Outcome: Progressing   Problem: Health Behavior/Discharge Planning: Goal: Ability to make decisions will improve Outcome: Progressing   

## 2020-12-27 NOTE — Progress Notes (Addendum)
[facility] MD Progress Note  12/27/2020 1:53 PM Anthony Knox  MRN:  [id number]  Reason for admission:  Anthony Knox is a 60 year old male who presented voluntarily to the [facility] per recommendation of his brother because of concern for possible  medication non-compliance. On first presentation on 12/06/20, patient became ill (A-fib) and transferred to [facility]. Patient returned to [facility] after medical clearance. Chart notes indicate he was irritable, disorganized, tangential, and paranoid.  Subjective:  Chart was reviewed, Probation officer met with patient. He states he is "doing OK." Evaluation on unit: Patient approached when he was in bed, resting. He states that he is going to groups, and not learning "anything special." Chart notes indicate no participation in groups yesterday. He is alert and oriented. He answers questions readily, but with minimal responses, short "yes or no" answers, except stating that his mood is "laid back."  He has minimal engagement with Probation officer. Patient denies SI/HI/AVH. Denies paranoia. Does not appear to be responding to internal or external stimuli. Endorses adequate sleep and appetite. Takes medication as prescribed.   Principal Problem: Schizoaffective disorder ([place]) Diagnosis: Principal Problem:   Schizoaffective disorder ([place]) Active Problems:   MDD (major depressive disorder), recurrent episode, severe ([place])  Total Time spent with patient: 15 minutes  Past Psychiatric History: Per H&P: " He was previously diagnosed with major depression as well as psychotic illness.  His last hospitalization at our facility was in December 2019.  His diagnosis at that time was psychosis.  His discharge medications included the long-acting Abilify injection, Cogentin, Tegretol, hydroxyzine, Risperdal as well as temazepam.  Prior to that he was hospitalized here in 05/07/2018 and discharged on the long-acting paliperidone injection as well as trazodone."  Past Medical History:   Past Medical History:   Diagnosis Date   Schizophrenia ([place])    History reviewed. No pertinent surgical history. Family History: History reviewed. No pertinent family history. Family Psychiatric  History: Per H&P: Denies family psychiatric history Social History:  Social History   Substance and Sexual Activity  Alcohol Use Not Currently     Social History   Substance and Sexual Activity  Drug Use Never    Social History   Socioeconomic History   Marital status: Single    Spouse name: Not on file   Number of children: Not on file   Years of education: Not on file   Highest education level: Not on file  Occupational History   Not on file  Tobacco Use   Smoking status: Never   Smokeless tobacco: Never  Vaping Use   Vaping Use: Never used  Substance and Sexual Activity   Alcohol use: Not Currently   Drug use: Never   Sexual activity: Not Currently  Other Topics Concern   Not on file  Social History Narrative   Not on file   Social Determinants of Health   Financial Resource Strain: Not on file  Food Insecurity: Not on file  Transportation Needs: Not on file  Physical Activity: Not on file  Stress: Not on file  Social Connections: Not on file   Additional Social History:      Sleep: Good  Appetite:  Good  Current Medications: Current Facility-Administered Medications  Medication Dose Route Frequency Provider Last Rate Last Admin   acetaminophen (TYLENOL) tablet 650 mg  650 mg Oral Q6H PRN Damita Dunnings B, MD   650 mg at 12/18/20 0959   alum & mag hydroxide-simeth (MAALOX/MYLANTA) 200-200-20 MG/5ML suspension 30 mL  30 mL Oral Q4H PRN  Freida Busman, MD       benztropine (COGENTIN) tablet 0.5 mg  0.5 mg Oral Q6H PRN Arthor Captain, MD       benztropine (COGENTIN) tablet 0.5 mg  0.5 mg Oral Daily Arthor Captain, MD   0.5 mg at 12/27/20 0754   benztropine (COGENTIN) tablet 0.5 mg  0.5 mg Oral QHS Arthor Captain, MD   0.5 mg at 12/26/20 2118   diphenhydrAMINE (BENADRYL) injection  50 mg  50 mg Intramuscular Q6H PRN Arthor Captain, MD       divalproex (DEPAKOTE ER) 24 hr tablet 1,250 mg  1,250 mg Oral QHS Onuoha, Josephine C, NP   1,250 mg at 12/26/20 2118   hydrOXYzine (ATARAX/VISTARIL) tablet 25 mg  25 mg Oral Q6H PRN Arthor Captain, MD   25 mg at 12/20/20 0759   magnesium hydroxide (MILK OF MAGNESIA) suspension 30 mL  30 mL Oral Daily PRN Damita Dunnings B, MD       polyethylene glycol (MIRALAX / GLYCOLAX) packet 17 g  17 g Oral Daily PRN Sharma Covert, MD       ramelteon (ROZEREM) tablet 8 mg  8 mg Oral QHS Suella Broad, FNP   8 mg at 12/26/20 2118   risperiDONE (RISPERDAL M-TABS) disintegrating tablet 5 mg  5 mg Oral QHS Arthor Captain, MD   5 mg at 12/26/20 2118   senna (SENOKOT) tablet 8.6 mg  1 tablet Oral Daily Suella Broad, FNP   8.6 mg at 12/27/20 0754    Lab Results: No results found for this or any previous visit (from the past 76 hour(s)).  Blood Alcohol level:  Lab Results  Component Value Date   ETH <10 06/18/2018   ETH <10 90/30/0923    Metabolic Disorder Labs: Lab Results  Component Value Date   HGBA1C 5.9 (H) 12/07/2020   MPG 123 12/07/2020   MPG 122.63 05/08/2018   Lab Results  Component Value Date   PROLACTIN 34.1 (H) 05/08/2018   Lab Results  Component Value Date   CHOL 170 12/07/2020   TRIG 56 12/07/2020   HDL 32 (L) 12/07/2020   CHOLHDL 5.3 12/07/2020   VLDL 11 12/07/2020   LDLCALC 127 (H) 12/07/2020   LDLCALC 87 05/08/2018    Physical Findings: AIMS: Facial and Oral Movements Muscles of Facial Expression: None, normal Lips and Perioral Area: None, normal Jaw: None, normal Tongue: None, normal,Extremity Movements Upper (arms, wrists, hands, fingers): None, normal Lower (legs, knees, ankles, toes): None, normal, Trunk Movements Neck, shoulders, hips: None, normal, Overall Severity Severity of abnormal movements (highest score from questions above): None, normal Incapacitation due to abnormal  movements: None, normal Patient's awareness of abnormal movements (rate only patient's report): No Awareness, Dental Status Current problems with teeth and/or dentures?: No Does patient usually wear dentures?: No  CIWA:    COWS:     Musculoskeletal: Strength & Muscle Tone: within normal limits Gait & Station:  not observed Patient leans: N/A  Psychiatric Specialty Exam:  Presentation  General Appearance: Appropriate for Environment  Eye Contact:Good  Speech:Clear and Coherent; Normal Rate  Speech Volume:Normal  Handedness:Right   Mood and Affect  Mood:Euthymic  Affect:Constricted   Thought Process  Thought Processes:Coherent; Goal Directed  Descriptions of Associations:Intact  Orientation:Full (Time, Place and Person)  Thought Content:Logical  History of Schizophrenia/Schizoaffective disorder:Yes  Duration of Psychotic Symptoms:Greater than six months  Hallucinations:Hallucinations: None  Ideas of Reference:None  Suicidal Thoughts:Suicidal Thoughts: No  Homicidal Thoughts:Homicidal Thoughts: No   Sensorium  Memory:Immediate Fair; Recent Fair  Judgment:Fair  Insight:Shallow   Executive Functions  Concentration:Fair  Attention Span:Fair  [place]  Language:Good   Psychomotor Activity  Psychomotor Activity:Psychomotor Activity: Normal   Assets  Assets:Desire for Improvement; Housing; Resilience; Social Support   Sleep  Sleep:Sleep: Good Number of Hours of Sleep: 6.75    Physical Exam: Physical Exam Vitals and nursing note reviewed.  HENT:     Head: Normocephalic.     Nose: No congestion or rhinorrhea.  Eyes:     General:        Right eye: No discharge.        Left eye: No discharge.  Pulmonary:     Effort: Pulmonary effort is normal.  Musculoskeletal:     Cervical back: Normal range of motion.  Neurological:     Mental Status: He is alert and oriented to person, place, and time.   Review  of Systems  Psychiatric/Behavioral:  Negative for depression, hallucinations, memory loss, substance abuse and suicidal ideas. The patient is not nervous/anxious and does not have insomnia.   Blood pressure 98/70, pulse (!) 59, temperature 98 F (36.7 C), temperature source Oral, resp. rate 18, height '5\' 7"'  (1.702 m), weight 79.8 kg, SpO2 96 %. Body mass index is 27.57 kg/m.   Treatment Plan Summary: Daily contact with patient to assess and evaluate symptoms and progress in treatment and Medication management  12/27/2020 Update: Patient is stable today. States he slept well with change of Risperdal dose given at night. Encouraged patient to attend groups. Social work continues to make attempts at discharge planning, making contacts.    Continue every 15-minute observation status   Encouraged participation in group therapy and therapeutic milieu   Schizoaffective disorder, currently depressed -Consolidate Risperdal to single dose of 5 mg at bedtime.  Risperdal is being used for mood stabilization and psychotic symptoms will monitor for reemergence of psychotic symptoms.   -Continue Depakote 1250 ER at bedtime for mood stabilization. -Continue Cogentin 0.5 mg each morning and 0.5 mg at bedtime for EPS   Anxiety -Continue hydroxyzine 25 mg every 6 hours PRN anxiety or sleep   Insomnia -Continue Rozerem 8 mg at bedtime for sleep   Constipation -Continue MiraLAX PRN -Continue Senokot 8.6 mg daily   Cardiology outpatient follow-up -Spoke with cardiology consultant on 12/25/2020 and was advised that patient needs to call the [facility] cardiology department at [phone] after discharge to schedule an appointment with cardiology.     Discharge planning in progress.  Social work is trying to clarify whether patient has an ACT team and determine housing plan for patient.  Social work is also attempting to connect with patient's brother.    Sherlon Handing, NP,  PMHNP-BC 12/27/2020, 1:53 PM

## 2020-12-27 NOTE — BHH Counselor (Signed)
CSW received a call and voicemail from Anthony Knox, [facility] [address] [phone] (office) and [phone] (Cell) stating that she is the worker that has been assigned to Mr. Angelos case.  CSW attempted to call both numbers back and received no answer.  CSW was unable to leave a voicemail on either phone due to the mailbox being full or unavailable.  CSW will continue to attempt to contact Mrs. Sharp to discuss this case further.

## 2020-12-27 NOTE — Progress Notes (Signed)
Psychoeducational Group Note  Date:  12/27/2020 Time:  2015  Group Topic/Focus:  Wrap up group  Participation Level: Did Not Attend  Participation Quality:  Not Applicable  Affect:  Not Applicable  Cognitive:  Not Applicable  Insight:  Not Applicable  Engagement in Group: Not Applicable  Additional Comments:  Did not attend.   [name] 12/27/2020, 10:42 PM

## 2020-12-28 MED ORDER — PALIPERIDONE PALMITATE ER 156 MG/ML IM SUSY: 156.0000 mg | PREFILLED_SYRINGE | Freq: Once | INTRAMUSCULAR | Status: DC

## 2020-12-28 MED ORDER — PALIPERIDONE PALMITATE ER 234 MG/1.5ML IM SUSY
234.0000 mg | PREFILLED_SYRINGE | Freq: Once | INTRAMUSCULAR | Status: DC
  Filled 2020-12-28: qty 1.5

## 2020-12-28 NOTE — Tx Team (Signed)
Interdisciplinary Treatment and Diagnostic Plan Update  12/28/2020 Time of Session: 1:20pm Anthony Knox MRN: [id number]  Principal Diagnosis: Schizoaffective disorder [facility])  Secondary Diagnoses: Principal Problem:   Schizoaffective disorder (HCC) Active Problems:   MDD (major depressive disorder), recurrent episode, severe (HCC)   Current Medications:  Current Facility-Administered Medications  Medication Dose Route Frequency Provider Last Rate Last Admin   acetaminophen (TYLENOL) tablet 650 mg  650 mg Oral Q6H PRN Eliseo Gum B, MD   650 mg at 12/18/20 0959   alum & mag hydroxide-simeth (MAALOX/MYLANTA) 200-200-20 MG/5ML suspension 30 mL  30 mL Oral Q4H PRN Eliseo Gum B, MD       benztropine (COGENTIN) tablet 0.5 mg  0.5 mg Oral Q6H PRN Claudie Revering, MD       benztropine (COGENTIN) tablet 0.5 mg  0.5 mg Oral Daily Claudie Revering, MD   0.5 mg at 12/28/20 1246   benztropine (COGENTIN) tablet 0.5 mg  0.5 mg Oral QHS Claudie Revering, MD   0.5 mg at 12/27/20 2103   diphenhydrAMINE (BENADRYL) injection 50 mg  50 mg Intramuscular Q6H PRN Claudie Revering, MD       divalproex (DEPAKOTE ER) 24 hr tablet 1,250 mg  1,250 mg Oral QHS Onuoha, Josephine C, NP   1,250 mg at 12/27/20 2103   hydrOXYzine (ATARAX/VISTARIL) tablet 25 mg  25 mg Oral Q6H PRN Claudie Revering, MD   25 mg at 12/20/20 0759   magnesium hydroxide (MILK OF MAGNESIA) suspension 30 mL  30 mL Oral Daily PRN Eliseo Gum B, MD       polyethylene glycol (MIRALAX / GLYCOLAX) packet 17 g  17 g Oral Daily PRN Antonieta Pert, MD       ramelteon (ROZEREM) tablet 8 mg  8 mg Oral QHS [name], FNP   8 mg at 12/27/20 2103   risperiDONE (RISPERDAL M-TABS) disintegrating tablet 5 mg  5 mg Oral QHS Claudie Revering, MD   5 mg at 12/27/20 2103   senna (SENOKOT) tablet 8.6 mg  1 tablet Oral Daily [name], FNP   8.6 mg at 12/27/20 0754   PTA Medications: Medications Prior to Admission  Medication Sig  Dispense Refill Last Dose   benztropine (COGENTIN) 0.5 MG tablet Take 1 tablet (0.5 mg total) by mouth 2 (two) times daily. For EPS 60 tablet 0    divalproex (DEPAKOTE) 500 MG DR tablet Take 2 tablets (1,000 mg total) by mouth 2 (two) times daily.      docusate sodium (COLACE) 100 MG capsule Take 100 mg by mouth daily.      hydrOXYzine (ATARAX/VISTARIL) 50 MG tablet Take 1 tablet (50 mg total) by mouth every 4 (four) hours as needed for anxiety (Sleep). 30 tablet 0    polyethylene glycol (MIRALAX / GLYCOLAX) 17 g packet Take 17 g by mouth daily. 14 each 0    ramelteon (ROZEREM) 8 MG tablet Take 1 tablet (8 mg total) by mouth at bedtime.      risperiDONE (RISPERDAL M-TABS) 2 MG disintegrating tablet Take 1 tablet (2 mg total) by mouth 2 (two) times daily.       Patient Stressors: Health problems Medication change or noncompliance  Patient Strengths: Ability for insight Capable of independent living Motivation for treatment/growth Physical Health Supportive family/friends  Treatment Modalities: Medication Management, Group therapy, Case management,  1 to 1 session with clinician, Psychoeducation, Recreational therapy.   Physician Treatment Plan for Primary Diagnosis: Schizoaffective disorder (  HCC) Long Term Goal(s): Improvement in symptoms so as ready for discharge Improvement in symptoms so as ready for discharge   Short Term Goals: Ability to identify changes in lifestyle to reduce recurrence of condition will improve Ability to verbalize feelings will improve Ability to demonstrate self-control will improve Ability to identify and develop effective coping behaviors will improve Ability to maintain clinical measurements within normal limits will improve Compliance with prescribed medications will improve Ability to identify changes in lifestyle to reduce recurrence of condition will improve Ability to verbalize feelings will improve Ability to demonstrate self-control will  improve Ability to identify and develop effective coping behaviors will improve Ability to maintain clinical measurements within normal limits will improve Compliance with prescribed medications will improve  Medication Management: Evaluate patient's response, side effects, and tolerance of medication regimen.  Therapeutic Interventions: 1 to 1 sessions, Unit Group sessions and Medication administration.  Evaluation of Outcomes: Progressing  Physician Treatment Plan for Secondary Diagnosis: Principal Problem:   Schizoaffective disorder (HCC) Active Problems:   MDD (major depressive disorder), recurrent episode, severe (HCC)  Long Term Goal(s): Improvement in symptoms so as ready for discharge Improvement in symptoms so as ready for discharge   Short Term Goals: Ability to identify changes in lifestyle to reduce recurrence of condition will improve Ability to verbalize feelings will improve Ability to demonstrate self-control will improve Ability to identify and develop effective coping behaviors will improve Ability to maintain clinical measurements within normal limits will improve Compliance with prescribed medications will improve Ability to identify changes in lifestyle to reduce recurrence of condition will improve Ability to verbalize feelings will improve Ability to demonstrate self-control will improve Ability to identify and develop effective coping behaviors will improve Ability to maintain clinical measurements within normal limits will improve Compliance with prescribed medications will improve     Medication Management: Evaluate patient's response, side effects, and tolerance of medication regimen.  Therapeutic Interventions: 1 to 1 sessions, Unit Group sessions and Medication administration.  Evaluation of Outcomes: Progressing   RN Treatment Plan for Primary Diagnosis: Schizoaffective disorder (HCC) Long Term Goal(s): Knowledge of disease and therapeutic regimen  to maintain health will improve  Short Term Goals: Ability to remain free from injury will improve, Ability to participate in decision making will improve and Ability to verbalize feelings will improve  Medication Management: RN will administer medications as ordered by provider, will assess and evaluate patient's response and provide education to patient for prescribed medication. RN will report any adverse and/or side effects to prescribing provider.  Therapeutic Interventions: 1 on 1 counseling sessions, Psychoeducation, Medication administration, Evaluate responses to treatment, Monitor vital signs and CBGs as ordered, Perform/monitor CIWA, COWS, AIMS and Fall Risk screenings as ordered, Perform wound care treatments as ordered.  Evaluation of Outcomes: Progressing   LCSW Treatment Plan for Primary Diagnosis: Schizoaffective disorder (HCC) Long Term Goal(s): Safe transition to appropriate next level of care at discharge, Engage patient in therapeutic group addressing interpersonal concerns.  Short Term Goals: Engage patient in aftercare planning with referrals and resources, Increase social support and Increase ability to appropriately verbalize feelings  Therapeutic Interventions: Assess for all discharge needs, 1 to 1 time with Social worker, Explore available resources and support systems, Assess for adequacy in community support network, Educate family and significant other(s) on suicide prevention, Complete Psychosocial Assessment, Interpersonal group therapy.  Evaluation of Outcomes: Progressing   Progress in Treatment: Attending groups: Yes. Participating in groups: Yes. Taking medication as prescribed: Yes. Toleration medication: Yes.  Family/Significant other contact made: Yes, individual(s) contacted:  Brother  Patient understands diagnosis: Yes. Discussing patient identified problems/goals with staff: No. Medical problems stabilized or resolved: Yes. Denies  suicidal/homicidal ideation: Yes. Issues/concerns per patient self-inventory: No. Other: None  New problem(s) identified: No, Describe:  None  New Short Term/Long Term Goal(s):medication stabilization, elimination of SI thoughts, development of comprehensive mental wellness plan.  Patient Goals:  Did not attend   Discharge Plan or Barriers:  Patient is to return to apartment and has been referred for ACT services.   Reason for Continuation of Hospitalization: Depression Medication stabilization  Estimated Length of Stay: 1-3 days  Attendees: Patient:  12/24/2020   Physician:  12/24/2020   Nursing:  12/24/2020   RN Care Manager: 12/24/2020   Social Worker: Ruthann Cancer, LCSW 12/24/2020   Recreational Therapist:  12/24/2020   Other:  12/24/2020   Other:  12/24/2020   Other: 12/24/2020       Scribe for Treatment Team: Otelia Santee, LCSW 12/28/2020 2:29 PM

## 2020-12-28 NOTE — [facility] Group Notes (Signed)
[facility] Group Notes: (Nursing/MHT/Case Management/Adjunct)   Date:  12/28/2020  Time:  10:00 AM   Type of Therapy:  Group therapy Participation Level:  Did Not Attend   Participation Quality:     Affect:     Cognitive:     Insight:     Engagement in Group:     Modes of Intervention:  Activity and Rapport Building   Summary of Progress/Problems:  Did not attend despite staff encouragement.     Norm Parcel Selma Mink 6/116/2022 10:00 AM

## 2020-12-28 NOTE — [facility] Group Notes (Signed)
[facility] Group Notes:  (Nursing/MHT/Case Management/Adjunct)   Date:  12/28/2020  Time:  11:55 AM   Type of Therapy:   Goals group   Participation Level:  Did Not Attend   Participation Quality:   Did not attend   Affect:       Cognitive:       Insight:     Engagement in Group:   Did not attend   Modes of Intervention:  Discussion and Education   Summary of Progress/Problems:   Anthony Knox [name] 12/28/2020, 11:55 AM 

## 2020-12-28 NOTE — Plan of Care (Signed)
  Problem: Self-Concept: Goal: Level of anxiety will decrease Outcome: Progressing   Problem: Education: Goal: Verbalization of understanding the information provided will improve Outcome: Progressing   Problem: Coping: Goal: Ability to verbalize frustrations and anger appropriately will improve Outcome: Progressing

## 2020-12-28 NOTE — Progress Notes (Signed)
[facility] MD Progress Note  12/28/2020 5:38 PM Anthony Knox  MRN:  [id number]   Reason for admission: The patient is a 60 year old male with a reported past psychiatric history of schizophrenia who was admitted because "my brother felt I was not taking my mental health medication and brought me here."  Chart notes document that he was irritable, disorganized, tangential and paranoid on admission.  Objective: Medical record reviewed.  Patient's case discussed in detail with members of the treatment team.  I met with and evaluated the patient today for follow-up on the unit.  Patient continues to present as improved but superficial.  His affect is constricted and thought processes are coherent.  He reports irritable mood but does not present as significantly irritable.  Paucity of thought content in general is elicited but patient continues to report feeling paranoid about his brother and is worried about his brother "tampering with my medications at home" after discharge.  He expresses concern that he did not do well during an interview that he had with his ACT team today because he stated he did not want his brother involved.  He denies SI, AI, AH or VH.  I discussed with him starting treatment with Kirt Boys LAI and patient stated agreement.  The first loading dose of Invega Sustenna LAI 234 mg to be administered today with a second loading dose of 156 mg to be administered in 4 days.  Patient denies any medication side effects or physical problems.  Patient would like to go home but he acknowledges that he currently has no transportation and would have no way to get to the store or to get his medications or to his appointments.  Social work is working to set up ACT team, hospital transitions team and is much outpatient support is possible for patient in preparation for discharge.  Patient's brother is seeking guardianship but otherwise does not want to be involved in patient's care per social work report.   He slept 6 hours last night.  Principal Problem: Schizoaffective disorder ([place]) Diagnosis: Principal Problem:   Schizoaffective disorder ([place]) Active Problems:   MDD (major depressive disorder), recurrent episode, severe ([place])  Total Time spent with patient: 20 minutes  Past Psychiatric History: See admission H&P  Past Medical History:  Past Medical History:  Diagnosis Date   Schizophrenia ([place])    History reviewed. No pertinent surgical history. Family History: History reviewed. No pertinent family history. Family Psychiatric  History: See admission H&P Social History:  Social History   Substance and Sexual Activity  Alcohol Use Not Currently     Social History   Substance and Sexual Activity  Drug Use Never    Social History   Socioeconomic History   Marital status: Single    Spouse name: Not on file   Number of children: Not on file   Years of education: Not on file   Highest education level: Not on file  Occupational History   Not on file  Tobacco Use   Smoking status: Never   Smokeless tobacco: Never  Vaping Use   Vaping Use: Never used  Substance and Sexual Activity   Alcohol use: Not Currently   Drug use: Never   Sexual activity: Not Currently  Other Topics Concern   Not on file  Social History Narrative   Not on file   Social Determinants of Health   Financial Resource Strain: Not on file  Food Insecurity: Not on file  Transportation Needs: Not on file  Physical Activity: Not on file  Stress: Not on file  Social Connections: Not on file   Additional Social History:                         Sleep: Good  Appetite:  Good  Current Medications: Current Facility-Administered Medications  Medication Dose Route Frequency Provider Last Rate Last Admin   acetaminophen (TYLENOL) tablet 650 mg  650 mg Oral Q6H PRN Damita Dunnings B, MD   650 mg at 12/18/20 0959   alum & mag hydroxide-simeth (MAALOX/MYLANTA) 200-200-20 MG/5ML suspension 30  mL  30 mL Oral Q4H PRN Damita Dunnings B, MD       benztropine (COGENTIN) tablet 0.5 mg  0.5 mg Oral Q6H PRN Arthor Captain, MD       benztropine (COGENTIN) tablet 0.5 mg  0.5 mg Oral Daily Arthor Captain, MD   0.5 mg at 12/28/20 1246   benztropine (COGENTIN) tablet 0.5 mg  0.5 mg Oral QHS Arthor Captain, MD   0.5 mg at 12/27/20 2103   diphenhydrAMINE (BENADRYL) injection 50 mg  50 mg Intramuscular Q6H PRN Arthor Captain, MD       divalproex (DEPAKOTE ER) 24 hr tablet 1,250 mg  1,250 mg Oral QHS [place], Josephine C, NP   1,250 mg at 12/27/20 2103   hydrOXYzine (ATARAX/VISTARIL) tablet 25 mg  25 mg Oral Q6H PRN Arthor Captain, MD   25 mg at 12/20/20 0759   magnesium hydroxide (MILK OF MAGNESIA) suspension 30 mL  30 mL Oral Daily PRN Freida Busman, MD       [START ON 01/01/2021] paliperidone (INVEGA SUSTENNA) injection 156 mg  156 mg Intramuscular Once Arthor Captain, MD       paliperidone (INVEGA SUSTENNA) injection 234 mg  234 mg Intramuscular Once Arthor Captain, MD       polyethylene glycol (MIRALAX / GLYCOLAX) packet 17 g  17 g Oral Daily PRN Sharma Covert, MD       ramelteon (ROZEREM) tablet 8 mg  8 mg Oral QHS Suella Broad, FNP   8 mg at 12/27/20 2103   risperiDONE (RISPERDAL M-TABS) disintegrating tablet 5 mg  5 mg Oral QHS Arthor Captain, MD   5 mg at 12/27/20 2103   senna (SENOKOT) tablet 8.6 mg  1 tablet Oral Daily Suella Broad, FNP   8.6 mg at 12/27/20 0754    Lab Results:  No results found for this or any previous visit (from the past 30 hour(s)).   Blood Alcohol level:  Lab Results  Component Value Date   ETH <10 06/18/2018   ETH <10 32/44/0102    Metabolic Disorder Labs: Lab Results  Component Value Date   HGBA1C 5.9 (H) 12/07/2020   MPG 123 12/07/2020   MPG 122.63 05/08/2018   Lab Results  Component Value Date   PROLACTIN 34.1 (H) 05/08/2018   Lab Results  Component Value Date   CHOL 170 12/07/2020   TRIG 56 12/07/2020   HDL 32  (L) 12/07/2020   CHOLHDL 5.3 12/07/2020   VLDL 11 12/07/2020   LDLCALC 127 (H) 12/07/2020   LDLCALC 87 05/08/2018    Physical Findings: AIMS: Facial and Oral Movements Muscles of Facial Expression: None, normal Lips and Perioral Area: None, normal Jaw: None, normal Tongue: None, normal,Extremity Movements Upper (arms, wrists, hands, fingers): None, normal Lower (legs, knees, ankles, toes): None, normal, Trunk Movements Neck, shoulders, hips: None, normal,  Overall Severity Severity of abnormal movements (highest score from questions above): None, normal Incapacitation due to abnormal movements: None, normal Patient's awareness of abnormal movements (rate only patient's report): No Awareness, Dental Status Current problems with teeth and/or dentures?: No Does patient usually wear dentures?: No  CIWA:    COWS:     Musculoskeletal: Strength & Muscle Tone: within normal limits Gait & Station: Not tested; patient reports he no longer feels off balance when he walks Patient leans: N/A  Psychiatric Specialty Exam:  Presentation  General Appearance: Appropriate for Environment  Eye Contact:Good  Speech:Clear and Coherent; Normal Rate  Speech Volume:Normal  Handedness:Right   Mood and Affect  Mood:Irritable  Affect:Constricted   Thought Process  Thought Processes:Coherent; Goal Directed  Descriptions of Associations:Intact  Orientation:Full (Time, Place and Person)  Thought Content:Paranoid Ideation  History of Schizophrenia/Schizoaffective disorder:Yes  Duration of Psychotic Symptoms:Greater than six months  Hallucinations:Hallucinations: None  Ideas of Reference:None  Suicidal Thoughts:Suicidal Thoughts: No  Homicidal Thoughts:Homicidal Thoughts: No   Sensorium  Memory:Immediate Good; Recent Fair; Remote Fair  Judgment:Fair  Insight:Shallow   Executive Functions  Concentration:Fair  Attention Span:Fair  [place]  Language:Good   Psychomotor Activity  Psychomotor Activity:Psychomotor Activity: Normal   Assets  Assets:Desire for Improvement; Housing; Resilience; Social Support   Sleep  Sleep:Sleep: Good Number of Hours of Sleep: 6    Physical Exam: Physical Exam Vitals and nursing note reviewed.  Constitutional:      General: He is not in acute distress. HENT:     Head: Normocephalic and atraumatic.  Pulmonary:     Effort: Pulmonary effort is normal.  Neurological:     General: No focal deficit present.     Mental Status: He is alert.   Review of Systems  Constitutional: Negative.  Negative for diaphoresis and fever.  HENT: Negative.    Eyes: Negative.   Respiratory: Negative.    Cardiovascular: Negative.   Gastrointestinal: Negative.  Negative for nausea and vomiting.  Genitourinary: Negative.   Musculoskeletal: Negative.   Skin:  Negative for rash.  Neurological: Negative.  Negative for tremors and seizures.  Psychiatric/Behavioral:  Negative for depression, hallucinations and suicidal ideas. The patient does not have insomnia.    Patient refused vital signs this morning Blood pressure 109/76, pulse (!) 59, temperature 97.8 F (36.6 C), temperature source Oral, resp. rate 17, height '5\' 7"'  (1.702 m), weight 79.8 kg, SpO2 96 %. Body mass index is 27.57 kg/m.   Treatment Plan Summary: Daily contact with patient to assess and evaluate symptoms and progress in treatment and Medication management   Continue every 15-minute observation status  Encouraged participation in group therapy and therapeutic milieu  Schizoaffective disorder, currently depressed -Start Lorayne Bender Sustenna LAI first loading dose of 234 mg IM ordered to be administered today 12/28/2020 with plan to administer second loading dose of 156 mg IM on 12/28/2020. -Continue Risperdal 5 mg at bedtime.  Risperdal is being used for mood stabilization and psychotic symptoms.  Plan is for eventual  taper and discontinuation of oral Risperdal after Kirt Boys second loading dose administered. -Continue Depakote 1250 ER at bedtime for mood stabilization.  -Continue Cogentin 0.5 mg each morning and 0.5 mg at bedtime for EPS  Anxiety -Continue hydroxyzine 25 mg every 6 hours PRN anxiety or sleep  Insomnia -Continue Rozerem 8 mg at bedtime for sleep  Constipation -Continue MiraLAX PRN -Continue Senokot 8.6 mg daily  Cardiology outpatient follow-up -Spoke with cardiology consultant on 12/25/2020 and was  advised that patient needs to call the [facility] health medical group cardiology department at [phone] after discharge to schedule an appointment with cardiology.    Discharge planning in progress.  Social work is trying to clarify whether patient has an ACT team and determine housing plan for patient.  Social work is also attempting to set up hospital transitions team and other support and resources outside the hospital for patient in preparation for discharge   Arthor Captain, MD 12/28/2020, 5:38 PM

## 2020-12-28 NOTE — B[facility] Counselor (Signed)
CSW attempted to contact the [facility] APS worker Mrs. Sharp that is assigned to Anthony Knox case.  CSW was unable to leave a message at [phone] because the mailbox was full.  CSW was unable to leave a message on the office phone [phone] because a voicemail did not pick up after several rings.  CSW will attempt to contact the APS worker again this afternoon.

## 2020-12-28 NOTE — Care Management Important Message (Signed)
Medicare IM given to Anthony Webster, LCSW to give to the patient. 

## 2020-12-28 NOTE — B[facility] Counselor (Signed)
CSW spoke with [employer] Customer service manager and was told that they no longer work with [employer].  CSW was transferred to [facility] and spoke again with Amy [phone] who stated that Dr. Maryruth Bun had asked that Mr. Dykema receive a phone call after his discharge to see if he needs anything once he is back at home.  Amy states that she will be updated information sent to her today for a review by the case manager.  Amy states that Mr. Demmer has not been approved for any other services through [facility].  CSW will send this paperwork by fax today.

## 2020-12-28 NOTE — Progress Notes (Signed)
Recreation Therapy Notes  Date:  6.17.22 Time: 0930 Location: [address] Dayroom  Group Topic: Stress Management  Goal Area(s) Addresses:  Patient will identify positive stress management techniques. Patient will identify benefits of using stress management post d/c.  Intervention: Stress Management  Activity :  Meditation.  LRT played on meditation that focused on looking at each day as Knox new opportunity to do something new and be productive.  Education:  Stress Management, Discharge Planning.   Education Outcome: Acknowledges Education  Clinical Observations/Feedback:  Pt did not attend group session.    Anthony Knox, LRT/CTRS         Anthony Knox 12/28/2020 11:25 AM 

## 2020-12-28 NOTE — Progress Notes (Signed)
   12/27/20 2103  Psych Admission Type (Psych Patients Only)  Admission Status Voluntary  Psychosocial Assessment  Patient Complaints None  Eye Contact Fair  Facial Expression Animated  Affect Appropriate to circumstance  Speech Logical/coherent  Interaction Assertive  Motor Activity Slow  Appearance/Hygiene Unremarkable  Behavior Characteristics Cooperative;Appropriate to situation  Mood Pleasant  Thought Process  Coherency WDL  Content WDL  Delusions None reported or observed  Perception WDL  Hallucination None reported or observed  Judgment WDL  Confusion WDL  Danger to Self  Current suicidal ideation? Denies  Danger to Others  Danger to Others None reported or observed

## 2020-12-28 NOTE — Progress Notes (Signed)
Progress note    12/28/20 0813  Psych Admission Type (Psych Patients Only)  Admission Status Voluntary  Psychosocial Assessment  Patient Complaints None  Eye Contact Fair  Facial Expression Animated  Affect Appropriate to circumstance  Speech Logical/coherent  Interaction Assertive  Motor Activity Slow  Appearance/Hygiene Unremarkable  Behavior Characteristics Cooperative;Appropriate to situation  Mood Pleasant  Thought Process  Coherency WDL  Content WDL  Delusions WDL  Perception WDL  Hallucination None reported or observed  Judgment WDL  Confusion WDL  Danger to Self  Current suicidal ideation? Denies  Danger to Others  Danger to Others None reported or observed

## 2020-12-29 LAB — TSH: TSH: 15.009 u[IU]/mL — ABNORMAL HIGH (ref 0.350–4.500)

## 2020-12-29 MED ORDER — PALIPERIDONE PALMITATE ER 234 MG/1.5ML IM SUSY
234.0000 mg | PREFILLED_SYRINGE | Freq: Once | INTRAMUSCULAR | Status: AC
  Administered 2020-12-29: 234 mg via INTRAMUSCULAR

## 2020-12-29 MED ORDER — PALIPERIDONE PALMITATE ER 156 MG/ML IM SUSY: 156.0000 mg | PREFILLED_SYRINGE | Freq: Once | INTRAMUSCULAR | Status: DC

## 2020-12-29 NOTE — B[facility] Group Notes (Signed)
  [facility] LCSW Group Therapy Note  Date/Time:  12/29/2020 10-11am  Type of Therapy and Topic:  Group Therapy:  Feelings About Hospitalization  Participation Level:  Minimal   Description of Group This process group involved patients discussing their feelings related to being hospitalized, as well as the benefits they see to being in the hospital.  These feelings and benefits were itemized.  The group then brainstormed specific ways in which they could seek those same benefits when they discharge and return home.  Therapeutic Goals Patient will identify and describe positive and negative feelings related to hospitalization Patient will verbalize benefits of hospitalization to themselves personally Patients will brainstorm together ways they can obtain similar benefits in the outpatient setting, identify barriers to wellness and possible solutions  Summary of Patient Progress:  The patient expressed his primary feelings about being hospitalized are "hopeful" that he will get out soon.  He feels his brother put him here, and stated he does not understand why he is here because he does not have a drinking or drug problem.  Therapeutic Modalities Cognitive Behavioral Therapy Motivational Interviewing    [name], LCSW 12/29/2020, 1:55 PM

## 2020-12-29 NOTE — Progress Notes (Signed)
[facility] MD Progress Note  12/29/2020 12:02 PM HODGES TREIBER  MRN:  [id number] Subjective: Patient is a 60 year old male with a reported past psychiatric history significant for schizophrenia who originally presented to the behavioral health center assessment service on 12/07/2020.  The patient apparently had been off medications for sometime, secondary to a period of incarceration.  He was admitted to the medical hospital for atrial fibrillation, and then transferred to our facility secondary to psychosis.  Objective: Patient is seen and examined.  Patient is a 60 year old male with the above-stated past psychiatric history who is seen in follow-up.  I am covering for Dr. Fayrene Fearing today, but I was on the day the patient had been admitted to the hospital.  He looks good today.  He denied any auditory or visual hallucinations.  He denied any suicidal or homicidal ideation.  He denied any side effects to his current medications.  His vital signs are stable, he is afebrile.  His pulse was between 56 and 57.  Sleep was reported at 5 hours.  No recent laboratories.  Review of his laboratories revealed a TSH at 9.441 on 5/26, but a normal T3 and T4.  Valproic acid level from 6/9 was 69.  His CBC from 6/9 was completely normal with a platelet count of 191,000.  This is approximately where he has been over the last 3 weeks.  Liver function enzymes from 6/9 were normal.  Creatinine from 6/2 was 0.99.  Principal Problem: Schizoaffective disorder (HCC) Diagnosis: Principal Problem:   Schizoaffective disorder (HCC) Active Problems:   MDD (major depressive disorder), recurrent episode, severe (HCC)  Total Time spent with patient: 15 minutes  Past Psychiatric History: See admission H&P  Past Medical History:  Past Medical History:  Diagnosis Date   Schizophrenia (HCC)    History reviewed. No pertinent surgical history. Family History: History reviewed. No pertinent family history. Family Psychiatric  History: See  admission H&P Social History:  Social History   Substance and Sexual Activity  Alcohol Use Not Currently     Social History   Substance and Sexual Activity  Drug Use Never    Social History   Socioeconomic History   Marital status: Single    Spouse name: Not on file   Number of children: Not on file   Years of education: Not on file   Highest education level: Not on file  Occupational History   Not on file  Tobacco Use   Smoking status: Never   Smokeless tobacco: Never  Vaping Use   Vaping Use: Never used  Substance and Sexual Activity   Alcohol use: Not Currently   Drug use: Never   Sexual activity: Not Currently  Other Topics Concern   Not on file  Social History Narrative   Not on file   Social Determinants of Health   Financial Resource Strain: Not on file  Food Insecurity: Not on file  Transportation Needs: Not on file  Physical Activity: Not on file  Stress: Not on file  Social Connections: Not on file   Additional Social History:                         Sleep: Fair  Appetite:  Good  Current Medications: Current Facility-Administered Medications  Medication Dose Route Frequency Provider Last Rate Last Admin   acetaminophen (TYLENOL) tablet 650 mg  650 mg Oral Q6H PRN Eliseo Gum B, MD   650 mg at 12/18/20 0959   alum &  mag hydroxide-simeth (MAALOX/MYLANTA) 200-200-20 MG/5ML suspension 30 mL  30 mL Oral Q4H PRN Eliseo Gum B, MD       benztropine (COGENTIN) tablet 0.5 mg  0.5 mg Oral Q6H PRN Claudie Revering, MD       benztropine (COGENTIN) tablet 0.5 mg  0.5 mg Oral Daily Claudie Revering, MD   0.5 mg at 12/29/20 2633   benztropine (COGENTIN) tablet 0.5 mg  0.5 mg Oral QHS Claudie Revering, MD   0.5 mg at 12/28/20 2113   diphenhydrAMINE (BENADRYL) injection 50 mg  50 mg Intramuscular Q6H PRN Claudie Revering, MD       divalproex (DEPAKOTE ER) 24 hr tablet 1,250 mg  1,250 mg Oral QHS Onuoha, Josephine C, NP   1,250 mg at 12/28/20 2113    hydrOXYzine (ATARAX/VISTARIL) tablet 25 mg  25 mg Oral Q6H PRN Claudie Revering, MD   25 mg at 12/20/20 0759   magnesium hydroxide (MILK OF MAGNESIA) suspension 30 mL  30 mL Oral Daily PRN Bobbye Morton, MD       [START ON 01/01/2021] paliperidone (INVEGA SUSTENNA) injection 156 mg  156 mg Intramuscular Once Claudie Revering, MD       polyethylene glycol (MIRALAX / GLYCOLAX) packet 17 g  17 g Oral Daily PRN Antonieta Pert, MD       ramelteon (ROZEREM) tablet 8 mg  8 mg Oral QHS [name], FNP   8 mg at 12/28/20 2114   risperiDONE (RISPERDAL M-TABS) disintegrating tablet 5 mg  5 mg Oral QHS Claudie Revering, MD   5 mg at 12/28/20 2114   senna (SENOKOT) tablet 8.6 mg  1 tablet Oral Daily [name], FNP   8.6 mg at 12/29/20 3545    Lab Results: No results found for this or any previous visit (from the past 48 hour(s)).  Blood Alcohol level:  Lab Results  Component Value Date   ETH <10 06/18/2018   ETH <10 05/05/2018    Metabolic Disorder Labs: Lab Results  Component Value Date   HGBA1C 5.9 (H) 12/07/2020   MPG 123 12/07/2020   MPG 122.63 05/08/2018   Lab Results  Component Value Date   PROLACTIN 34.1 (H) 05/08/2018   Lab Results  Component Value Date   CHOL 170 12/07/2020   TRIG 56 12/07/2020   HDL 32 (L) 12/07/2020   CHOLHDL 5.3 12/07/2020   VLDL 11 12/07/2020   LDLCALC 127 (H) 12/07/2020   LDLCALC 87 05/08/2018    Physical Findings: AIMS: Facial and Oral Movements Muscles of Facial Expression: None, normal Lips and Perioral Area: None, normal Jaw: None, normal Tongue: None, normal,Extremity Movements Upper (arms, wrists, hands, fingers): None, normal Lower (legs, knees, ankles, toes): None, normal, Trunk Movements Neck, shoulders, hips: None, normal, Overall Severity Severity of abnormal movements (highest score from questions above): None, normal Incapacitation due to abnormal movements: None, normal Patient's awareness of abnormal  movements (rate only patient's report): No Awareness, Dental Status Current problems with teeth and/or dentures?: No Does patient usually wear dentures?: No  CIWA:    COWS:     Musculoskeletal: Strength & Muscle Tone: within normal limits Gait & Station: normal Patient leans: N/A  Psychiatric Specialty Exam:  Presentation  General Appearance: Appropriate for Environment  Eye Contact:Good  Speech:Clear and Coherent; Normal Rate  Speech Volume:Normal  Handedness:Right   Mood and Affect  Mood:Irritable  Affect:Constricted   Thought Process  Thought Processes:Coherent; Goal Directed  Descriptions of  Associations:Intact  Orientation:Full (Time, Place and Person)  Thought Content:Paranoid Ideation  History of Schizophrenia/Schizoaffective disorder:Yes  Duration of Psychotic Symptoms:Greater than six months  Hallucinations:Hallucinations: None  Ideas of Reference:None  Suicidal Thoughts:Suicidal Thoughts: No  Homicidal Thoughts:Homicidal Thoughts: No   Sensorium  Memory:Immediate Good; Recent Fair; Remote Fair  Judgment:Fair  Insight:Shallow   Executive Functions  Concentration:Fair  Attention Span:Fair  Recall:Fair  Fund of Knowledge:Fair  Language:Good   Psychomotor Activity  Psychomotor Activity:Psychomotor Activity: Normal   Assets  Assets:Desire for Improvement; Housing; Resilience; Social Support   Sleep  Sleep:Sleep: Good Number of Hours of Sleep: 6    Physical Exam: Physical Exam Vitals and nursing note reviewed.  Constitutional:      Appearance: Normal appearance.  HENT:     Head: Normocephalic and atraumatic.  Pulmonary:     Effort: Pulmonary effort is normal.  Neurological:     General: No focal deficit present.     Mental Status: He is alert and oriented to person, place, and time.   ROS Blood pressure 94/71, pulse (!) 57, temperature 97.6 F (36.4 C), temperature source Oral, resp. rate 17, height 5\' 7"  (1.702  m), weight 79.8 kg, SpO2 96 %. Body mass index is 27.57 kg/m.   Treatment Plan Summary: Daily contact with patient to assess and evaluate symptoms and progress in treatment, Medication management, and Plan patient is seen and examined.  Patient is a 61 year old male with the above-stated past psychiatric history who is seen in follow-up.  Diagnosis: 1.  Schizophrenia versus schizoaffective disorder; depressive type 2.  Recent onset atrial fibrillation which has been absent during the course of his psychiatric hospitalization. 3.  Abnormal thyroid function studies  Pertinent findings on examination today: 1.  Patient received the long-acting paliperidone injection 234 mg IM today.  This is for psychosis. 2.  Patient is to receive the long-acting paliperidone injection 156 mg IM x1 on 01/01/2021.  This is as well for psychosis. 3.  Continue Cogentin 0.5 mg p.o. daily and 0.5 mg p.o. nightly secondary to side effects of current medications. 4.  Continue Depakote ER 1250 mg p.o. nightly for mood stability. 5.  Continue MiraLAX 17 g 8 ounces of water p.o. daily as needed constipation. 6.  Continue Rozerem 8 mg p.o. nightly for insomnia. 7.  Continue Risperdal 5 mg p.o. nightly for psychosis. 8.  Continue Senokot 8.6 mg p.o. daily for constipation. 9.  Repeat TSH. 10.  Disposition planning-issues with regard to housing and guardianship are still in process, and the patient just received the long-acting Invega injection today.  01/03/2021, MD 12/29/2020, 12:02 PM

## 2020-12-29 NOTE — [facility] Group Notes (Signed)
[facility] Group Notes:  (Nursing/MHT/Case Management/Adjunct)  Date:  12/29/2020  Time:  10:23 AM  Type of Therapy:   Goals group  Participation Level:  Active  Participation Quality:  Appropriate, Attentive, and Sharing  Affect:  Depressed  Cognitive:  Alert and Appropriate  Insight:  Improving  Engagement in Group:  Engaged  Modes of Intervention:  Discussion and Education  Summary of Progress/Problems:  He reported his goal for today is "to think of peaceful things."  He stated he slept ok with medications.  One positive thing about himself is "I'm going to be happy when I go home."  Levin Bacon 12/29/2020, 10:23 AM

## 2020-12-29 NOTE — Progress Notes (Signed)
[facility] Group Notes:  (Nursing/MHT/Case Management/Adjunct)  Date:  12/29/2020  Time:  2015  Type of Therapy:   wrap up group  Participation Level:  Active  Participation Quality:  Appropriate, Attentive, Sharing, and Supportive  Affect:  Appropriate  Cognitive:  Alert  Insight:  Improving  Engagement in Group:  Supportive  Modes of Intervention:  Clarification, Education, and Support  Summary of Progress/Problems: Positive thinking and self-care were discussed.   [name] 12/29/2020, 9:21 PM

## 2020-12-29 NOTE — Progress Notes (Signed)
[place] NOVEL CORONAVIRUS (COVID-19) DAILY CHECK-OFF SYMPTOMS - answer yes or no to each - every day NO YES  Have you had a fever in the past 24 hours?  . Fever (Temp > 37.80C / 100F) X   Have you had any of these symptoms in the past 24 hours? . New Cough .  Sore Throat  .  Shortness of Breath .  Difficulty Breathing .  Unexplained Body Aches   X   Have you had any one of these symptoms in the past 24 hours not related to allergies?   . Runny Nose .  Nasal Congestion .  Sneezing   X   If you have had runny nose, nasal congestion, sneezing in the past 24 hours, has it worsened?  X   EXPOSURES - check yes or no X   Have you traveled outside the state in the past 14 days?  X   Have you been in contact with someone with a confirmed diagnosis of COVID-19 or PUI in the past 14 days without wearing appropriate PPE?  X   Have you been living in the same home as a person with confirmed diagnosis of COVID-19 or a PUI (household contact)?    X   Have you been diagnosed with COVID-19?    X              What to do next: Answered NO to all: Answered YES to anything:   Proceed with unit schedule Follow the [facility] Inpatient Flowsheet.   

## 2020-12-29 NOTE — Progress Notes (Addendum)
   12/29/20 1100  Psych Admission Type (Psych Patients Only)  Admission Status Voluntary  Psychosocial Assessment  Patient Complaints None  Eye Contact Fair  Facial Expression Flat  Affect Appropriate to circumstance  Speech Logical/coherent  Interaction Assertive  Motor Activity Slow  Appearance/Hygiene Unremarkable  Behavior Characteristics Cooperative;Appropriate to situation  Mood Pleasant  Thought Process  Coherency WDL  Content WDL  Delusions None reported or observed  Perception WDL  Hallucination None reported or observed  Judgment WDL  Confusion None  Danger to Self  Current suicidal ideation? Denies  Danger to Others  Danger to Others None reported or observed  D. Pt is friendly during interactions- calm and cooperative on the unit- observed attending groups. Per pt's self inventory, pt rated his depression, hopelessness and anxiety all 0's. Pt wrote that his goal today is to work on "pleasant thoughts", and "work on breathing".  Pt currently denies SI/HI and AVH  A. Labs and vitals monitored. Pt received LAI Invega today.  Pt supported emotionally and encouraged to express concerns and ask questions.   R. Pt remains safe with 15 minute checks. Will continue POC.

## 2020-12-29 NOTE — Progress Notes (Signed)
D: Patient presents with appropriate affect at time of assessment. Patient denies SI/HI at this time. Patient also denies AH/VH at this time. Patient contracts for safety. A: Provided positive reinforcement and encouragement.  R: Patient cooperative and receptive to efforts. Patient remains safe on the unit.   12/28/20 2113  Psych Admission Type (Psych Patients Only)  Admission Status Voluntary  Psychosocial Assessment  Patient Complaints None  Eye Contact Fair  Facial Expression Flat  Affect Appropriate to circumstance  Speech Logical/coherent  Interaction Assertive  Motor Activity Slow  Appearance/Hygiene Unremarkable  Behavior Characteristics Cooperative;Appropriate to situation  Mood Pleasant  Thought Process  Coherency WDL  Content WDL  Delusions None reported or observed  Perception WDL  Hallucination None reported or observed  Judgment WDL  Confusion None  Danger to Self  Current suicidal ideation? Denies  Danger to Others  Danger to Others None reported or observed

## 2020-12-30 MED ORDER — LEVOTHYROXINE SODIUM 50 MCG PO TABS
50.0000 ug | ORAL_TABLET | Freq: Every day | ORAL | Status: DC
  Administered 2020-12-31 – 2021-01-04 (×5): 50 ug via ORAL
  Filled 2020-12-30: qty 7
  Filled 2020-12-30 (×3): qty 1
  Filled 2020-12-30: qty 2
  Filled 2020-12-30 (×2): qty 1
  Filled 2020-12-30 (×2): qty 7

## 2020-12-30 NOTE — [facility] Group Notes (Signed)
[facility] LCSW Group Therapy Note  12/30/2020  10:00-11:00AM  Type of Therapy and Topic:  Group Therapy:  Acknowledging and Resolving Issues with Fathers  Participation Level:  Minimal   Description of Group:   Patients in this group were asked to briefly describe their experience with the father figure(s) in their lives, both in childhood and adulthood.  Different types of support provided by these individuals were identified.   Patients were then encouraged to determine whether their father figure was or is a healthy or unhealthy support.  The manner in which that early relationship has shaped patient's feelings and life decisions was pointed out and acknowledged.  Group members gave support to each other.  CSW led a discussion on how helpful it can be to resolve past issues, and how this can be done whether the father figure is now alive or already deceased.  An emphasis was placed on continuing to work with a therapist on these issues  when patients leave the hospital in order to be able to focus on the future instead of the past, to continue becoming healthier and happier.   Therapeutic Goals: 1)  discuss the possibility of father figure(s) being positive and/or negative in one's life, normalizing that some people never had positive experiences with "paternal" persons  2)  describe patient's specific example of father figure(s), allowing time to vent  3)  identify the patient's current need for resolution in the relationship with the aforementioned person  4)  elicit commitments to work on resolving feelings about father figure(s) in order to move forward in life and wellness   Summary of Patient Progress:  The patient expressed good comprehension of the concepts presented, and did not talk during the group discussion, but did share his own story as we went around the room.  He stated that his father was "not around much."  He remembers one time going fishing with his father who promised a monetary  reward for the number of fish, biggest fish, and such, and he won $3.  Another time he was supposed to take patient to the circus but did not show up.  He found out later his father was opposed to the "cruelty" of the circus, so now he thinks maybe it was a good thing that his father did not take him.  Finally, there was a time his father was supposed to pick up from school to take him to the dentist but did not show up, so he walked 10 miles to get home.  He did not react emotionally to any of the things he shared, but seemed fairly matter-of-fact about them.   Therapeutic Modalities:   Processing Brief Solution-Focused Therapy  [name]

## 2020-12-30 NOTE — Progress Notes (Addendum)
   12/30/20 1355  Psych Admission Type (Psych Patients Only)  Admission Status Voluntary  Psychosocial Assessment  Patient Complaints None  Eye Contact Fair  Facial Expression Flat  Affect Appropriate to circumstance  Speech Logical/coherent  Interaction Assertive  Motor Activity Slow  Appearance/Hygiene Unremarkable  Behavior Characteristics Cooperative;Appropriate to situation  Mood Pleasant  Thought Process  Coherency WDL  Content WDL  Delusions None reported or observed  Perception WDL  Hallucination None reported or observed  Judgment WDL  Confusion None  Danger to Self  Current suicidal ideation? Denies  Danger to Others  Danger to Others None reported or observed   D. Pt has been calm and cooperative on the unit- observed attending group led by SW. Per pt's self inventory, pt rated his depression, hopelessness and anxiety all 0's. Pt reported that his goal today to work on was "positive affirmations", and "do breathing exercises". Pt currently denies SI/HI and AVH  A. Labs and vitals monitored. Pt compliant with medications. Pt supported emotionally and encouraged to express concerns and ask questions.   R. Pt remains safe with 15 minute checks. Will continue POC.

## 2020-12-30 NOTE — Progress Notes (Signed)
[place] NOVEL CORONAVIRUS (COVID-19) DAILY CHECK-OFF SYMPTOMS - answer yes or no to each - every day NO YES  Have you had a fever in the past 24 hours?  . Fever (Temp > 37.80C / 100F) X   Have you had any of these symptoms in the past 24 hours? . New Cough .  Sore Throat  .  Shortness of Breath .  Difficulty Breathing .  Unexplained Body Aches   X   Have you had any one of these symptoms in the past 24 hours not related to allergies?   . Runny Nose .  Nasal Congestion .  Sneezing   X   If you have had runny nose, nasal congestion, sneezing in the past 24 hours, has it worsened?  X   EXPOSURES - check yes or no X   Have you traveled outside the state in the past 14 days?  X   Have you been in contact with someone with a confirmed diagnosis of COVID-19 or PUI in the past 14 days without wearing appropriate PPE?  X   Have you been living in the same home as a person with confirmed diagnosis of COVID-19 or a PUI (household contact)?    X   Have you been diagnosed with COVID-19?    X              What to do next: Answered NO to all: Answered YES to anything:   Proceed with unit schedule Follow the [facility] Inpatient Flowsheet.   

## 2020-12-30 NOTE — Progress Notes (Signed)
   12/29/20 2158  Psych Admission Type (Psych Patients Only)  Admission Status Voluntary  Psychosocial Assessment  Patient Complaints None  Eye Contact Fair  Facial Expression Flat  Affect Appropriate to circumstance  Speech Logical/coherent  Interaction Assertive  Motor Activity Slow  Appearance/Hygiene Unremarkable  Behavior Characteristics Cooperative;Appropriate to situation  Mood Pleasant  Thought Process  Coherency WDL  Content WDL  Delusions None reported or observed  Perception WDL  Hallucination None reported or observed  Judgment WDL  Confusion None  Danger to Self  Current suicidal ideation? Denies  Danger to Others  Danger to Others None reported or observed

## 2020-12-30 NOTE — Progress Notes (Signed)
[facility] MD Progress Note  12/30/2020 2:09 PM TARON MONDOR  MRN:  [id number] Subjective:  Patient is a 60 year old male with a reported past psychiatric history significant for schizophrenia who originally presented to the behavioral health center assessment service on 12/07/2020.  The patient apparently had been off medications for sometime, secondary to a period of incarceration.  He was admitted to the medical hospital for atrial fibrillation, and then transferred to our facility secondary to psychosis.  Objective: Patient is seen and examined.  Patient is a 60 year old male with the above-stated past psychiatric history who is seen in follow-up.  He is basically the same as he was yesterday.  He denied any auditory or visual hallucinations.  He denied any suicidal or homicidal ideation.  He denied any side effects to his current medications.  He remains in the hospital because of issues with regard to housing and guardianship.  His vital signs are stable, he is afebrile.  He slept approximately 5-1/2 hours last night.  Review of laboratories revealed a TSH at 15.009.  3 weeks ago it was 9.441.  Principal Problem: Schizoaffective disorder (HCC) Diagnosis: Principal Problem:   Schizoaffective disorder (HCC) Active Problems:   MDD (major depressive disorder), recurrent episode, severe (HCC)  Total Time spent with patient: 15 minutes  Past Psychiatric History: See admission H&P  Past Medical History:  Past Medical History:  Diagnosis Date   Schizophrenia (HCC)    History reviewed. No pertinent surgical history. Family History: History reviewed. No pertinent family history. Family Psychiatric  History: See admission H&P Social History:  Social History   Substance and Sexual Activity  Alcohol Use Not Currently     Social History   Substance and Sexual Activity  Drug Use Never    Social History   Socioeconomic History   Marital status: Single    Spouse name: Not on file   Number of  children: Not on file   Years of education: Not on file   Highest education level: Not on file  Occupational History   Not on file  Tobacco Use   Smoking status: Never   Smokeless tobacco: Never  Vaping Use   Vaping Use: Never used  Substance and Sexual Activity   Alcohol use: Not Currently   Drug use: Never   Sexual activity: Not Currently  Other Topics Concern   Not on file  Social History Narrative   Not on file   Social Determinants of Health   Financial Resource Strain: Not on file  Food Insecurity: Not on file  Transportation Needs: Not on file  Physical Activity: Not on file  Stress: Not on file  Social Connections: Not on file   Additional Social History:                         Sleep: Fair  Appetite:  Good  Current Medications: Current Facility-Administered Medications  Medication Dose Route Frequency Provider Last Rate Last Admin   acetaminophen (TYLENOL) tablet 650 mg  650 mg Oral Q6H PRN Eliseo Gum B, MD   650 mg at 12/18/20 0959   alum & mag hydroxide-simeth (MAALOX/MYLANTA) 200-200-20 MG/5ML suspension 30 mL  30 mL Oral Q4H PRN Eliseo Gum B, MD       benztropine (COGENTIN) tablet 0.5 mg  0.5 mg Oral Q6H PRN Claudie Revering, MD       benztropine (COGENTIN) tablet 0.5 mg  0.5 mg Oral Daily Claudie Revering, MD   0.5  mg at 12/30/20 0808   benztropine (COGENTIN) tablet 0.5 mg  0.5 mg Oral QHS Claudie Revering, MD   0.5 mg at 12/29/20 2158   diphenhydrAMINE (BENADRYL) injection 50 mg  50 mg Intramuscular Q6H PRN Claudie Revering, MD       divalproex (DEPAKOTE ER) 24 hr tablet 1,250 mg  1,250 mg Oral QHS Onuoha, Josephine C, NP   1,250 mg at 12/29/20 2158   hydrOXYzine (ATARAX/VISTARIL) tablet 25 mg  25 mg Oral Q6H PRN Claudie Revering, MD   25 mg at 12/20/20 0759   magnesium hydroxide (MILK OF MAGNESIA) suspension 30 mL  30 mL Oral Daily PRN Bobbye Morton, MD       [START ON 01/01/2021] paliperidone (INVEGA SUSTENNA) injection 156 mg  156 mg  Intramuscular Once Claudie Revering, MD       polyethylene glycol (MIRALAX / GLYCOLAX) packet 17 g  17 g Oral Daily PRN Antonieta Pert, MD       ramelteon (ROZEREM) tablet 8 mg  8 mg Oral QHS [name], FNP   8 mg at 12/29/20 2158   risperiDONE (RISPERDAL M-TABS) disintegrating tablet 5 mg  5 mg Oral QHS Claudie Revering, MD   5 mg at 12/29/20 2158   senna (SENOKOT) tablet 8.6 mg  1 tablet Oral Daily [name], FNP   8.6 mg at 12/30/20 5038    Lab Results:  Results for orders placed or performed during the hospital encounter of 12/13/20 (from the past 48 hour(s))  TSH     Status: Abnormal   Collection Time: 12/29/20  6:23 PM  Result Value Ref Range   TSH 15.009 (H) 0.350 - 4.500 uIU/mL    Comment: Performed by a 3rd Generation assay with a functional sensitivity of <=0.01 uIU/mL. Performed at [facility], [address] [place], [place] [address]     Blood Alcohol level:  Lab Results  Component Value Date   ETH <10 06/18/2018   ETH <10 05/05/2018    Metabolic Disorder Labs: Lab Results  Component Value Date   HGBA1C 5.9 (H) 12/07/2020   MPG 123 12/07/2020   MPG 122.63 05/08/2018   Lab Results  Component Value Date   PROLACTIN 34.1 (H) 05/08/2018   Lab Results  Component Value Date   CHOL 170 12/07/2020   TRIG 56 12/07/2020   HDL 32 (L) 12/07/2020   CHOLHDL 5.3 12/07/2020   VLDL 11 12/07/2020   LDLCALC 127 (H) 12/07/2020   LDLCALC 87 05/08/2018    Physical Findings: AIMS: Facial and Oral Movements Muscles of Facial Expression: None, normal Lips and Perioral Area: None, normal Jaw: None, normal Tongue: None, normal,Extremity Movements Upper (arms, wrists, hands, fingers): None, normal Lower (legs, knees, ankles, toes): None, normal, Trunk Movements Neck, shoulders, hips: None, normal, Overall Severity Severity of abnormal movements (highest score from questions above): None, normal Incapacitation due to abnormal  movements: None, normal Patient's awareness of abnormal movements (rate only patient's report): No Awareness, Dental Status Current problems with teeth and/or dentures?: No Does patient usually wear dentures?: No  CIWA:    COWS:     Musculoskeletal: Strength & Muscle Tone: within normal limits Gait & Station: normal Patient leans: N/A  Psychiatric Specialty Exam:  Presentation  General Appearance: Appropriate for Environment  Eye Contact:Good  Speech:Clear and Coherent; Normal Rate  Speech Volume:Normal  Handedness:Right   Mood and Affect  Mood:Irritable  Affect:Constricted   Thought Process  Thought Processes:Coherent;  Goal Directed  Descriptions of Associations:Intact  Orientation:Full (Time, Place and Person)  Thought Content:Paranoid Ideation  History of Schizophrenia/Schizoaffective disorder:Yes  Duration of Psychotic Symptoms:Greater than six months  Hallucinations:No data recorded Ideas of Reference:None  Suicidal Thoughts:No data recorded Homicidal Thoughts:No data recorded  Sensorium  Memory:Immediate Good; Recent Fair; Remote Fair  Judgment:Fair  Insight:Shallow   Executive Functions  Concentration:Fair  Attention Span:Fair  Recall:Fair  Fund of Knowledge:Fair  Language:Good   Psychomotor Activity  Psychomotor Activity: No data recorded  Assets  Assets:Desire for Improvement; Housing; Resilience; Social Support   Sleep  Sleep: No data recorded   Physical Exam: Physical Exam Vitals and nursing note reviewed.  Constitutional:      Appearance: Normal appearance.  Pulmonary:     Effort: Pulmonary effort is normal.  Neurological:     General: No focal deficit present.     Mental Status: He is alert and oriented to person, place, and time.   Review of Systems  All other systems reviewed and are negative. Blood pressure 102/72, pulse 61, temperature 97.9 F (36.6 C), temperature source Oral, resp. rate 16, height 5'  7" (1.702 m), weight 79.8 kg, SpO2 96 %. Body mass index is 27.57 kg/m.   Treatment Plan Summary: Daily contact with patient to assess and evaluate symptoms and progress in treatment, Medication management, and Plan patient is seen and examined.  Patient is a 60 year old male with the above-stated past psychiatric history who is seen in follow-up.  Diagnosis: 1.  Schizophrenia versus schizoaffective disorder; depressive type 2.  Recent onset atrial fibrillation which has been absent during the course of his psychiatric hospitalization. 3.  Abnormal thyroid function studies  Pertinent findings on examination today: 1.  Patient denied suicidal or homicidal ideation. 2.  Patient denied auditory or visual hallucinations. 3.  Sleep is relatively okay.  Plan: 1.  TSH is abnormal and appears to be hypothyroidism.  Free T3 is normal as well as free T4. 2.  We will start levothyroxine 50 mcg p.o. daily for hypothyroidism. 3.  Continue Cogentin 0.5 mg p.o. daily, nightly and as needed.  This is for side effects of medications. 4.  Continue Depakote ER 1250 mg p.o. nightly for mood stability. 5.  Continue hydroxyzine 25 mg p.o. every 6 hours as needed anxiety. 6.  Patient has received his first paliperidone long-acting injectable medication at 234 mg IM, and will receive the 156 mg dose on 01/01/2021.  This is for psychosis. 7.  Continue MiraLAX 17 g 8 ounces water p.o. daily as needed for constipation. 8.  Continue Rozerem 8 mg p.o. nightly for insomnia. 9.  Continue Risperdal 5 mg p.o. nightly for psychosis, mood stability and sleep. 10.  Continue Senokot 8.6 mg p.o. daily for constipation. 11.  Disposition planning-issues with guardianship and housing as well as awaiting his second paliperidone injection.  Antonieta Pert, MD 12/30/2020, 2:09 PM

## 2020-12-30 NOTE — Progress Notes (Signed)
[facility] Group Notes:  (Nursing/MHT/Case Management/Adjunct)  Date:  12/30/2020  Time:  2015  Type of Therapy:   wrap up group  Participation Level:  Active  Participation Quality:  Appropriate, Attentive, Sharing, and Supportive  Affect:  Depressed and Flat  Cognitive:  Alert  Insight:  Improving  Engagement in Group:  Engaged  Modes of Intervention:  Clarification, Education, and Support  Summary of Progress/Problems: Positive thinking and self-care were discussed.   [name] 12/30/2020, 10:38 PM

## 2020-12-30 NOTE — [facility] Group Notes (Signed)
Patient did not go to morning orientation and goal setting group, although he was made aware of it.

## 2020-12-31 MED ORDER — PALIPERIDONE PALMITATE ER 156 MG/ML IM SUSY
156.0000 mg | PREFILLED_SYRINGE | Freq: Once | INTRAMUSCULAR | Status: AC
  Administered 2021-01-02: 156 mg via INTRAMUSCULAR

## 2020-12-31 NOTE — [facility] Counselor (Signed)
CSW attempted to contact the APS at both numbers [phone] and [phone].  There are were no available voicemail boxes to leave a message for the APS worker.  CSW will continue to try to reach the APS worker by phone.

## 2020-12-31 NOTE — Progress Notes (Signed)
[facility] MD Progress Note  12/31/2020 4:33 PM Anthony Knox  MRN:  [id number]   Reason for admission: The patient is a 60 year old male with a reported past psychiatric history of schizophrenia who was admitted because "my brother felt I was not taking my mental health medication and brought me here."  Chart notes document that he was irritable, disorganized, tangential and paranoid on admission.  Objective: Medical record reviewed.  Patient's case discussed in detail with members of the treatment team.  I met with and evaluated the patient today for follow-up on the unit.  Patient was reclining in bed when approached to meet and presents as much the same with constricted affect, coherent thought processes and no psychotic content elicited other than some paranoid ideation regarding his brother.  It is unclear how much of this if any is reality based.  Patient reports worries about his house while he is gone and is concerned his brother may have taken trash from outside and put it back in the house.  He continues to experience fatigue during the day but states that he is sleeping and eating okay.  He denies depressed mood, passive wish for death, SI, AI, AH or VH.  He denies PI except the above-mentioned concerns about his brother.  The patient denies any physical problems.  He denies ataxia slurred speech or possible medication side effects.  I spoke with him about his hypothyroidism and the importance of taking Synthroid as not doing so will likely result in worsening fatigue, problems concentrating, low mood and difficulty functioning.  Patient stated willingness to take Synthroid after we spoke and did in fact take it later this morning.  I discussed with him the possibility of going to ALF after discharge.  Patient states he will consider ALF.  He reports sleeping well last night but hours of sleep were not recorded in the chart.  There are no new labs today but TSH drawn evening of 12/29/2020 was elevated at  15.009.  Patient has been attending and participating in some groups today.  He is taking scheduled medications as prescribed and has not required any PRN medications.  Principal Problem: Schizoaffective disorder ([place]) Diagnosis: Principal Problem:   Schizoaffective disorder ([place]) Active Problems:   MDD (major depressive disorder), recurrent episode, severe ([place])  Total Time spent with patient: 20 minutes  Past Psychiatric History: See admission H&P  Past Medical History:  Past Medical History:  Diagnosis Date   Schizophrenia ([place])    History reviewed. No pertinent surgical history. Family History: History reviewed. No pertinent family history. Family Psychiatric  History: See admission H&P Social History:  Social History   Substance and Sexual Activity  Alcohol Use Not Currently     Social History   Substance and Sexual Activity  Drug Use Never    Social History   Socioeconomic History   Marital status: Single    Spouse name: Not on file   Number of children: Not on file   Years of education: Not on file   Highest education level: Not on file  Occupational History   Not on file  Tobacco Use   Smoking status: Never   Smokeless tobacco: Never  Vaping Use   Vaping Use: Never used  Substance and Sexual Activity   Alcohol use: Not Currently   Drug use: Never   Sexual activity: Not Currently  Other Topics Concern   Not on file  Social History Narrative   Not on file   Social Determinants of  Health   Financial Resource Strain: Not on file  Food Insecurity: Not on file  Transportation Needs: Not on file  Physical Activity: Not on file  Stress: Not on file  Social Connections: Not on file   Additional Social History:                         Sleep: Good  Appetite:  Good  Current Medications: Current Facility-Administered Medications  Medication Dose Route Frequency Provider Last Rate Last Admin   acetaminophen (TYLENOL) tablet 650 mg  650 mg  Oral Q6H PRN Damita Dunnings B, MD   650 mg at 12/18/20 0959   alum & mag hydroxide-simeth (MAALOX/MYLANTA) 200-200-20 MG/5ML suspension 30 mL  30 mL Oral Q4H PRN Damita Dunnings B, MD       benztropine (COGENTIN) tablet 0.5 mg  0.5 mg Oral Q6H PRN Arthor Captain, MD       benztropine (COGENTIN) tablet 0.5 mg  0.5 mg Oral Daily Arthor Captain, MD   0.5 mg at 12/31/20 0813   benztropine (COGENTIN) tablet 0.5 mg  0.5 mg Oral QHS Arthor Captain, MD   0.5 mg at 12/30/20 2100   diphenhydrAMINE (BENADRYL) injection 50 mg  50 mg Intramuscular Q6H PRN Arthor Captain, MD       divalproex (DEPAKOTE ER) 24 hr tablet 1,250 mg  1,250 mg Oral QHS Onuoha, Josephine C, NP   1,250 mg at 12/30/20 2116   hydrOXYzine (ATARAX/VISTARIL) tablet 25 mg  25 mg Oral Q6H PRN Arthor Captain, MD   25 mg at 12/20/20 0759   levothyroxine (SYNTHROID) tablet 50 mcg  50 mcg Oral Q0600 Sharma Covert, MD   50 mcg at 12/31/20 1047   magnesium hydroxide (MILK OF MAGNESIA) suspension 30 mL  30 mL Oral Daily PRN Freida Busman, MD       [START ON 01/02/2021] paliperidone (INVEGA SUSTENNA) injection 156 mg  156 mg Intramuscular Once Arthor Captain, MD       polyethylene glycol (MIRALAX / GLYCOLAX) packet 17 g  17 g Oral Daily PRN Sharma Covert, MD       ramelteon (ROZEREM) tablet 8 mg  8 mg Oral QHS Suella Broad, FNP   8 mg at 12/30/20 2116   risperiDONE (RISPERDAL M-TABS) disintegrating tablet 5 mg  5 mg Oral QHS Arthor Captain, MD   5 mg at 12/30/20 2100   senna (SENOKOT) tablet 8.6 mg  1 tablet Oral Daily Suella Broad, FNP   8.6 mg at 12/31/20 6333    Lab Results:  Results for orders placed or performed during the hospital encounter of 12/13/20 (from the past 48 hour(s))  TSH     Status: Abnormal   Collection Time: 12/29/20  6:23 PM  Result Value Ref Range   TSH 15.009 (H) 0.350 - 4.500 uIU/mL    Comment: Performed by a 3rd Generation assay with a functional sensitivity of <=0.01 uIU/mL. Performed  at [facility], [place] [address], [place],  [address]      Blood Alcohol level:  Lab Results  Component Value Date   [facility] <10 06/18/2018   ETH <10 56/38/9373    Metabolic Disorder Labs: Lab Results  Component Value Date   HGBA1C 5.9 (H) 12/07/2020   MPG 123 12/07/2020   MPG 122.63 05/08/2018   Lab Results  Component Value Date   PROLACTIN 34.1 (H) 05/08/2018   Lab Results  Component Value Date   CHOL 170 12/07/2020   TRIG 56 12/07/2020   HDL 32 (L) 12/07/2020   CHOLHDL 5.3 12/07/2020   VLDL 11 12/07/2020   LDLCALC 127 (H) 12/07/2020   LDLCALC 87 05/08/2018    Physical Findings: AIMS: Facial and Oral Movements Muscles of Facial Expression: None, normal Lips and Perioral Area: None, normal Jaw: None, normal Tongue: None, normal,Extremity Movements Upper (arms, wrists, hands, fingers): None, normal Lower (legs, knees, ankles, toes): None, normal, Trunk Movements Neck, shoulders, hips: None, normal, Overall Severity Severity of abnormal movements (highest score from questions above): None, normal Incapacitation due to abnormal movements: None, normal Patient's awareness of abnormal movements (rate only patient's report): No Awareness, Dental Status Current problems with teeth and/or dentures?: No Does patient usually wear dentures?: No  CIWA:    COWS:     Musculoskeletal: Strength & Muscle Tone: within normal limits Gait & Station: Not tested; patient reports he no longer feels off balance when he walks Patient leans: N/A  Psychiatric Specialty Exam:  Presentation  General Appearance: Appropriate for Environment  Eye Contact:Fair  Speech:Clear and Coherent  Speech Volume:Normal  Handedness:Right   Mood and Affect  Mood:Euthymic  Affect:Constricted   Thought Process  Thought Processes:Coherent; Goal Directed  Descriptions of Associations:Intact  Orientation:Full (Time, Place and Person)  Thought Content:Paranoid  Ideation (Some paranoid ideation regarding brother; unclear whether may in part be reality based)  History of Schizophrenia/Schizoaffective disorder:Yes  Duration of Psychotic Symptoms:Greater than six months  Hallucinations:Hallucinations: None  Ideas of Reference:None  Suicidal Thoughts:Suicidal Thoughts: No  Homicidal Thoughts:Homicidal Thoughts: No   Sensorium  Memory:Immediate Fair; Recent Fair; Remote Fair  Judgment:Fair  Insight:Shallow   Executive Functions  Concentration:Fair  Attention Span:Fair  [place]  Language:Good   Psychomotor Activity  Psychomotor Activity:Psychomotor Activity: Normal   Assets  Assets:Communication Skills; Desire for Improvement; Housing; Resilience; Social Support   Sleep  Sleep:Sleep: Good    Physical Exam: Physical Exam Vitals and nursing note reviewed.  Constitutional:      General: He is not in acute distress. HENT:     Head: Normocephalic and atraumatic.  Pulmonary:     Effort: Pulmonary effort is normal.  Neurological:     General: No focal deficit present.     Mental Status: He is alert.   Review of Systems  Constitutional:  Positive for malaise/fatigue. Negative for diaphoresis and fever.  HENT: Negative.    Eyes: Negative.   Respiratory: Negative.    Cardiovascular: Negative.   Gastrointestinal: Negative.  Negative for nausea and vomiting.  Genitourinary: Negative.   Musculoskeletal: Negative.   Skin:  Negative for rash.  Neurological: Negative.  Negative for tremors and seizures.  Psychiatric/Behavioral:  Negative for depression, hallucinations and suicidal ideas. The patient does not have insomnia.    Patient refused vital signs this morning Blood pressure 104/79, pulse (!) 57, temperature 97.7 F (36.5 C), temperature source Oral, resp. rate 16, height '5\' 7"'  (1.702 m), weight 79.8 kg, SpO2 98 %. Body mass index is 27.57 kg/m.   Treatment Plan Summary: Daily  contact with patient to assess and evaluate symptoms and progress in treatment and Medication management   Continue every 15-minute observation status  Encouraged participation in group therapy and therapeutic milieu  Schizoaffective disorder, currently depressed -Continue Invega Sustenna LAI with second loading dose of 156 mg IM on 01/02/2021.  First loading dose of 234 mg IM was administered today 12/29/2020.  We will continue q. 28-day dosing after  01/02/2021 dose. -Continue Risperdal 5 mg at bedtime.  Risperdal is being used for mood stabilization and psychotic symptoms.  Plan is for eventual taper and discontinuation of oral Risperdal after Kirt Boys second loading dose administered. -Continue Depakote 1250 ER at bedtime for mood stabilization.  -Continue Cogentin 0.5 mg each morning and 0.5 mg at bedtime for EPS  Anxiety -Continue hydroxyzine 25 mg every 6 hours PRN anxiety or sleep  Insomnia -Continue Rozerem 8 mg at bedtime for sleep  Hypothyroidism -Continue Synthroid 50 mcg daily  Constipation -Continue MiraLAX PRN -Continue Senokot 8.6 mg daily  Cardiology outpatient follow-up -Spoke with cardiology consultant on 12/25/2020 and was advised that patient needs to call the [facility] health medical group cardiology department at [phone] after discharge to schedule an appointment with cardiology.    Discharge planning in progress.  Social work is trying to clarify whether patient has an ACT team and determine housing plan for patient.  Social work is also attempting to set up hospital transitions team and other support and resources outside the hospital for patient in preparation for discharge.  Patient may be willing to go to ALF.  I spoke with him today and he will consider.   Arthor Captain, MD 12/31/2020, 4:33 PM

## 2020-12-31 NOTE — [facility] Counselor (Signed)
CSW attempted again to contact the APS worker on Anthony Knox case.  The phone went straight to voicemail but there continues to be no way to leave a voicemail since the mailbox is full.  CSW attempted the office number but no voicemail box was available.  CSW will attempt again tomorrow.

## 2020-12-31 NOTE — [facility] Group Notes (Signed)
Type of Therapy and Topic:  Group Therapy - Healthy vs Unhealthy Coping Skills  Participation Level:  Active   Description of Group The focus of this group was to determine what unhealthy coping techniques typically are used by group members and what healthy coping techniques would be helpful in coping with various problems. Patients were guided in becoming aware of the differences between healthy and unhealthy coping techniques. Patients were asked to identify 2-3 healthy coping skills they would like to learn to use more effectively.  Therapeutic Goals Patients learned that coping is what human beings do all day long to deal with various situations in their lives Patients defined and discussed healthy vs unhealthy coping techniques Patients identified their preferred coping techniques and identified whether these were healthy or unhealthy Patients determined 2-3 healthy coping skills they would like to become more familiar with and use more often. Patients provided support and ideas to each other   Summary of Patient Progress:  During group, [place] expressed that he likes to "look, learn, and listen" as a coping skill. Patient proved open to input from peers and feedback from CSW. Patient demonstrated insight into the subject matter, was respectful of peers, and participated throughout the entire session.   Therapeutic Modalities Cognitive Behavioral Therapy Motivational Interviewing

## 2020-12-31 NOTE — B[facility] Group Notes (Signed)
Occupational Therapy Group Note Date: 12/31/2020 Group Topic/Focus: Self-Esteem  Group Description: Group encouraged increased engagement and participation through discussion and activity focused on self-esteem. Patients explored and discussed the differences between healthy and low self-esteem and how it affects our daily lives and occupations with a focus on relationships, work, school, self-care, and personal leisure interests. Group discussion then transitioned into identifying specific strategies to boost self-esteem and engaged in a collaborative and independent activity looking at positive thoughts and affirmations.   Therapeutic Goal(s): Understand and recognize the differences between healthy and low self-esteem Identify healthy strategies to improve/build self-esteem Participation Level: Patient did not attend OT group session despite personal invitation.    Plan: Continue to engage patient in OT groups 2 - 3x/week.  12/31/2020  Eulalia Ellerman, MOT, OTR/L 

## 2020-12-31 NOTE — Progress Notes (Signed)
   12/31/20 0605  Vital Signs  Temp 97.7 F (36.5 C)  Temp Source Oral  Pulse Rate (!) 56  Pulse Rate Source Monitor  BP 107/72  BP Location Right Arm  BP Method Automatic  Patient Position (if appropriate) Sitting  Oxygen Therapy  SpO2 98 %   D: Patient denies SI/HI/AVH. Patient denies anxiety and depression.  A:  Patient took scheduled medicine.  Support and encouragement provided Routine safety checks conducted every 15 minutes. Patient  Informed to notify staff with any concerns.   R: Safety maintained.

## 2020-12-31 NOTE — Progress Notes (Signed)
Recreation Therapy Notes  Date:  6.20.22 Time: 0930 Location: [address] Dayroom   Group Topic: Stress Management   Goal Area(s) Addresses: Patient will identify positive stress management techniques. Patient will identify benefits of using stress management post d/c.   Behavioral Response: Attentive   Intervention: Stress Management   Activity :  Meditation.  LRT played a meditation that focused on taking note of any tension that may be in the body or any sensations that may be felt such as cool, warmth, tingles or any slight sensations on the body.   Education:  Stress Management, Discharge Planning.   Education Outcome: Acknowledges Education   Clinical Observations/Feedback: Pt attended and participated.  Pt had no questions and expressed no concerns.       Montia Haslip, LRT/CTRS         Anastasya Jewell A 12/31/2020 12:40 PM 

## 2021-01-01 NOTE — [facility] Group Notes (Signed)
ADULT GRIEF GROUP NOTE:   Spiritual care group on grief and loss facilitated by chaplain Dyanne Carrel, [facility]   Group Goal:   Support / Education around grief and loss   Members engage in facilitated group support and psycho-social education.   Group Description:   Following introductions and group rules, group members engaged in facilitated group dialog and support around topic of loss, with particular support around experiences of loss in their lives. Group Identified types of loss (relationships / self / things) and identified patterns, circumstances, and changes that precipitate losses. Reflected on thoughts / feelings around loss, normalized grief responses, and recognized variety in grief experience. Group noted Worden's four tasks of grief in discussion.   Group drew on Adlerian / Rogerian, narrative, MI,   Patient Progress: Anthony Knox participated in group and showed attentive listening though he did not share verbally in the conversation.  Chaplain Dyanne Carrel, B[facility] Pager, [phone] 11:47 AM

## 2021-01-01 NOTE — Progress Notes (Signed)
[facility] MD Progress Note  01/01/2021 5:39 PM [facility]  MRN:  [id number]   Reason for admission: The patient is a 60 year old male with a reported past psychiatric history of schizophrenia who was admitted because "my brother felt I was not taking my mental health medication and brought me here."  Chart notes document that he was irritable, disorganized, tangential and paranoid on admission.  Objective: Medical record reviewed.  Patient's case discussed in detail with members of the treatment team.  I met with and evaluated the patient today for follow-up on the unit.  Team social worker was present during our conversation to participate in discussion about living arrangements after discharge.  Patient appears much the same although appears less tired with less motor slowing.  He continues to report euthymic mood, displays constricted affect and has coherent thought processes with some paranoid ideation that appears limited to his brother.  It is unclear to what degree patient's concerns about his brother are reality based if at all since the 2 of them appear to have a difficult relationship.  Patient states that he does not want his brother involved in his care after discharge.  Patient's brother has filed for guardianship and patient should be served with the papers regarding the guardianship hearing within the next day or 2.  Team social worker discussed this matter with patient.  He denies SI, AI, PI, AH or VH.  He denies any problems with his medication.  He denies any physical problems.  Patient stated desire to go to skilled nursing facility after discharge or to live independently at home without the involvement of his brother but with the assistance of community agencies who can help him with transportation and meals until he gets an ACT team.  He will receive his second [place] loading dose tomorrow.  Social work is attempting to get patient connected with an ACT team but there is a waiting  list and direct connection may not be able to be made until after patient leaves the hospital.  The patient slept 6.75 hours last night.  No new labs.  Patient is taking scheduled medications as prescribed and has not required any PRN medications.  Patient continues with bradycardia which he has had throughout this hospital course.  His pulse this morning was 59, BP was 112/68 and temperature was 97.7.  Principal Problem: Schizoaffective disorder ([place]) Diagnosis: Principal Problem:   Schizoaffective disorder ([employer]) Active Problems:   MDD (major depressive disorder), recurrent episode, severe ([place])  Total Time spent with patient: 20 minutes  Past Psychiatric History: See admission H&P  Past Medical History:  Past Medical History:  Diagnosis Date   Schizophrenia ([place])    History reviewed. No pertinent surgical history. Family History: History reviewed. No pertinent family history. Family Psychiatric  History: See admission H&P Social History:  Social History   Substance and Sexual Activity  Alcohol Use Not Currently     Social History   Substance and Sexual Activity  Drug Use Never    Social History   Socioeconomic History   Marital status: Single    Spouse name: Not on file   Number of children: Not on file   Years of education: Not on file   Highest education level: Not on file  Occupational History   Not on file  Tobacco Use   Smoking status: Never   Smokeless tobacco: Never  Vaping Use   Vaping Use: Never used  Substance and Sexual Activity   Alcohol use:  Not Currently   Drug use: Never   Sexual activity: Not Currently  Other Topics Concern   Not on file  Social History Narrative   Not on file   Social Determinants of Health   Financial Resource Strain: Not on file  Food Insecurity: Not on file  Transportation Needs: Not on file  Physical Activity: Not on file  Stress: Not on file  Social Connections: Not on file   Additional Social History:                          Sleep: Good  Appetite:  Good  Current Medications: Current Facility-Administered Medications  Medication Dose Route Frequency Provider Last Rate Last Admin   acetaminophen (TYLENOL) tablet 650 mg  650 mg Oral Q6H PRN Damita Dunnings B, MD   650 mg at 12/18/20 0959   alum & mag hydroxide-simeth (MAALOX/MYLANTA) 200-200-20 MG/5ML suspension 30 mL  30 mL Oral Q4H PRN Damita Dunnings B, MD       benztropine (COGENTIN) tablet 0.5 mg  0.5 mg Oral Q6H PRN Arthor Captain, MD       benztropine (COGENTIN) tablet 0.5 mg  0.5 mg Oral Daily Arthor Captain, MD   0.5 mg at 01/01/21 9798   benztropine (COGENTIN) tablet 0.5 mg  0.5 mg Oral QHS Arthor Captain, MD   0.5 mg at 12/31/20 2100   diphenhydrAMINE (BENADRYL) injection 50 mg  50 mg Intramuscular Q6H PRN Arthor Captain, MD       divalproex (DEPAKOTE ER) 24 hr tablet 1,250 mg  1,250 mg Oral QHS Onuoha, Josephine C, NP   1,250 mg at 12/31/20 2106   hydrOXYzine (ATARAX/VISTARIL) tablet 25 mg  25 mg Oral Q6H PRN Arthor Captain, MD   25 mg at 12/20/20 0759   levothyroxine (SYNTHROID) tablet 50 mcg  50 mcg Oral Q0600 Sharma Covert, MD   50 mcg at 01/01/21 9211   magnesium hydroxide (MILK OF MAGNESIA) suspension 30 mL  30 mL Oral Daily PRN Freida Busman, MD       [START ON 01/02/2021] paliperidone (INVEGA SUSTENNA) injection 156 mg  156 mg Intramuscular Once Arthor Captain, MD       polyethylene glycol (MIRALAX / GLYCOLAX) packet 17 g  17 g Oral Daily PRN Sharma Covert, MD       ramelteon (ROZEREM) tablet 8 mg  8 mg Oral QHS Suella Broad, FNP   8 mg at 12/31/20 2105   risperiDONE (RISPERDAL M-TABS) disintegrating tablet 5 mg  5 mg Oral QHS Arthor Captain, MD   5 mg at 12/31/20 2105   senna (SENOKOT) tablet 8.6 mg  1 tablet Oral Daily Suella Broad, FNP   8.6 mg at 01/01/21 9417    Lab Results:  No results found for this or any previous visit (from the past 78 hour(s)).    Blood Alcohol level:  Lab  Results  Component Value Date   ETH <10 06/18/2018   ETH <10 40/81/4481    Metabolic Disorder Labs: Lab Results  Component Value Date   HGBA1C 5.9 (H) 12/07/2020   MPG 123 12/07/2020   MPG 122.63 05/08/2018   Lab Results  Component Value Date   PROLACTIN 34.1 (H) 05/08/2018   Lab Results  Component Value Date   CHOL 170 12/07/2020   TRIG 56 12/07/2020   HDL 32 (L) 12/07/2020   CHOLHDL 5.3 12/07/2020   VLDL 11 12/07/2020  LDLCALC 127 (H) 12/07/2020   LDLCALC 87 05/08/2018    Physical Findings: AIMS: Facial and Oral Movements Muscles of Facial Expression: None, normal Lips and Perioral Area: None, normal Jaw: None, normal Tongue: None, normal,Extremity Movements Upper (arms, wrists, hands, fingers): None, normal Lower (legs, knees, ankles, toes): None, normal, Trunk Movements Neck, shoulders, hips: None, normal, Overall Severity Severity of abnormal movements (highest score from questions above): None, normal Incapacitation due to abnormal movements: None, normal Patient's awareness of abnormal movements (rate only patient's report): No Awareness, Dental Status Current problems with teeth and/or dentures?: No Does patient usually wear dentures?: No  CIWA:    COWS:     Musculoskeletal: Strength & Muscle Tone: within normal limits Gait & Station: Not tested; patient reports he no longer feels off balance when he walks Patient leans: N/A  Psychiatric Specialty Exam:  Presentation  General Appearance: Appropriate for Environment  Eye Contact:Fair  Speech:Clear and Coherent  Speech Volume:Normal  Handedness:Right   Mood and Affect  Mood:Euthymic  Affect:Constricted   Thought Process  Thought Processes:Coherent; Goal Directed  Descriptions of Associations:Intact  Orientation:Full (Time, Place and Person)  Thought Content:Paranoid Ideation (Patient has some paranoid ideation regarding brother; unclear to what degree his concerns about brother may  be reality based)  History of Schizophrenia/Schizoaffective disorder:Yes  Duration of Psychotic Symptoms:Greater than six months  Hallucinations:Hallucinations: None  Ideas of Reference:None  Suicidal Thoughts:Suicidal Thoughts: No  Homicidal Thoughts:Homicidal Thoughts: No   Sensorium  Memory:Immediate Fair; Recent Fair; Remote Fair  Judgment:Fair  Insight:Shallow   Executive Functions  Concentration:Fair  Attention Span:Fair  [place]  Language:Good   Psychomotor Activity  Psychomotor Activity:Psychomotor Activity: Normal   Assets  Assets:Communication Skills; Desire for Improvement; Housing; Resilience; Social Support   Sleep  Sleep:Sleep: Good Number of Hours of Sleep: 6.75    Physical Exam: Physical Exam Vitals and nursing note reviewed.  Constitutional:      General: He is not in acute distress. HENT:     Head: Normocephalic and atraumatic.  Pulmonary:     Effort: Pulmonary effort is normal.  Neurological:     General: No focal deficit present.     Mental Status: He is alert.   Review of Systems  Constitutional:  Negative for diaphoresis and fever.  HENT: Negative.    Eyes: Negative.   Respiratory: Negative.    Cardiovascular: Negative.   Gastrointestinal: Negative.  Negative for nausea and vomiting.  Genitourinary: Negative.   Musculoskeletal: Negative.   Skin:  Negative for rash.  Neurological: Negative.  Negative for tremors and seizures.  Psychiatric/Behavioral:  Negative for depression, hallucinations and suicidal ideas. The patient does not have insomnia.    Patient refused vital signs this morning Blood pressure 112/68, pulse (!) 59, temperature 97.7 F (36.5 C), temperature source Oral, resp. rate 16, height _0  (1.702 m), weight 79.8 kg, SpO2 97 %. Body mass index is 27.57 kg/m.   Treatment Plan Summary: Daily contact with patient to assess and evaluate symptoms and progress in treatment and  Medication management   Continue every 15-minute observation status  Encouraged participation in group therapy and therapeutic milieu  Schizoaffective disorder, currently depressed -Continue Invega Sustenna LAI with second loading dose of 156 mg IM to be given tomorrow 01/02/2021.  First loading dose of 234 mg IM was administered today 12/29/2020.  We will continue q. 28-day dosing after 01/02/2021 dose. -Continue Risperdal 5 mg at bedtime.  Risperdal is being used for mood stabilization and psychotic symptoms.  Plan is for eventual taper and discontinuation of oral Risperdal after Kirt Boys second loading dose administered. -Continue Depakote 1250 ER at bedtime for mood stabilization.  -Continue Cogentin 0.5 mg each morning and 0.5 mg at bedtime for EPS  Anxiety -Continue hydroxyzine 25 mg every 6 hours PRN anxiety or sleep  Insomnia -Continue Rozerem 8 mg at bedtime for sleep  Hypothyroidism -Continue Synthroid 50 mcg daily  Constipation -Continue MiraLAX PRN -Continue Senokot 8.6 mg daily  Cardiology outpatient follow-up -Spoke with cardiology consultant on 12/25/2020 and was advised that patient needs to call the [facility] cardiology department at [phone] after discharge to schedule an appointment with cardiology.    Discharge planning in progress.  Social work is trying to clarify whether patient has an ACT team and determine housing plan for patient.  There may be a waiting list for ACT team and patient may not get connected directly with ACT team prior to discharge.  Social work is also attempting to set up hospital transitions team and other support and resources outside the hospital for patient in preparation for discharge.  Patient is considering ALF or SNF.     Arthor Captain, MD 01/01/2021, 5:39 PM

## 2021-01-01 NOTE — BHH Counselor (Signed)
CSW spoke with Mrs. Sharp at [employer] ([facility]) [phone] who states that she just received the case last week and that there have been no changes since then.  She states that she will be reaching out to the brother, Mr. Eythan Jayne, today or tomorrow to discuss services that the patient has already been referred to by the hospital and to discuss what Guardianship looks like for both the patient and the brother.  Mrs. Lambert Mody states that if the brother wants to have Guardianship then he will have to be more involved.  Mrs. Lambert Mody states that she will follow up with the CSW in a few days to let her know what happens after speaking with the brother.

## 2021-01-01 NOTE — BHH Counselor (Signed)
CSW spoke with [facility] [phone] and was informed that the patient is currently 7th on the waitlist.  CSW was also informed by the receptionist that it will be at least 6 months before the patient could be accepted for ACTT services due to the high number of patients already being served and the amount of individuals on the current waiting list.  This is the only currently known ACTT provider that will accept the patient's insurance.

## 2021-01-01 NOTE — BHH Counselor (Signed)
CSW spoke with the brother, Minoru Chap, on 12/31/2020 at 3:00pm.  Mr. Buskey states that his brother will be served papers at the hospital by the [facility] for the up-coming court date for Guardianship.  Mr. Hurwitz states that he would like his brother placed somewhere that he cannot walk out of and that is not voluntary. CSW explained that there is not a service (other then jail) that can hold an individual and not allow them to leave.  Mr. Goodenow states that he is not happy about this information.  Mr. Kissoon states that he is also not happy about Adult Protective Services being contacted because he states that "they will just give him his money back and let him go and then what"?  CSW explained why APS was contacted and that if they did seek Guardianship at any point they would check with family first and then if no family was available would seek Guardianship and put things into place that would benefit his brother.  Mr. Sthilaire states that he does not believe this and then shouted at the CSW for several minutes about how the system is "corrupt".  CSW share with Mr. Hassell how his brother is doing at the hospital and any updates that were available.  Mr. Loiseau asked if there was visitation for him to come and see his brother.  CSW explained that due to Covid-19 there is no visitation in person but that he is welcome to use the virtual facetime to meet with him.  Mr. Radin stated "Do not use the C-word with me.  I don't use that word and I would rather die and burn in hell then to use virtual anything".  CSW stated that this was Mr. Biernat choice and that he did not have to use the service but that the only other service available at this time is by phone.  Mr. Texidor then thanked the CSW and stated that the CSW had been helpful and he looked forward to working with the CSW on getting help for his brother.  CSW then ended the call with Mr. Deruiter.

## 2021-01-01 NOTE — BHH Counselor (Signed)
CSW and Dr. Jeneen Rinks met with the patient to discuss possibilities of Skilled Nursing Facilities or Assisted Living Facilities.  The patient was open to these services but states that he is also open to living back at his home.  The patient states that when he is at home he walks to local restaurants to get food and states that his brother helps him get his medications.  The patient reports that his brother is his power of attorney at the bank only and states that he does not want his brother to be on that account.  The patient states that his brother was only on this account while he was in prison and now his brother refuses to take his name off the account.  The patient states that he is also open to receiving ACTT services and transportation services.  The patient states that he does not have a phone or license and that these 2 things are the only thing he feels is keeping him from living on his own.  The patient also states that he believes that his brother has been tampering with his medications and has used the bathroom on his floor to get back at him.  The patient does not want his brother to become his guardian and wishes to have limited contact with his brother.  CSW will continue to work with the patient to find services that will benefit the patient and improve his quality of life.

## 2021-01-01 NOTE — Progress Notes (Signed)
   01/01/21 0612  Vital Signs  Temp 97.7 F (36.5 C)  Temp Source Oral  Pulse Rate (!) 59  BP 112/68  BP Location Left Arm  BP Method Automatic  Patient Position (if appropriate) Standing  Oxygen Therapy  SpO2 97 %   D: Patient denies SI/HI/AV. Pt. Denied anxiety and depression..  A:  Patient took scheduled medicine.  Support and encouragement provided Routine safety checks conducted every 15 minutes. Patient  Informed to notify staff with any concerns.   R: Safety maintained.

## 2021-01-01 NOTE — Progress Notes (Signed)
   12/31/20 2000  Psych Admission Type (Psych Patients Only)  Admission Status Voluntary  Psychosocial Assessment  Patient Complaints None  Eye Contact Fair  Facial Expression Flat  Affect Appropriate to circumstance  Speech Logical/coherent  Interaction Assertive  Motor Activity Slow  Appearance/Hygiene Unremarkable  Behavior Characteristics Cooperative  Mood Pleasant  Thought Process  Coherency WDL  Content WDL  Delusions None reported or observed  Perception WDL  Hallucination None reported or observed  Judgment WDL  Confusion None  Danger to Self  Current suicidal ideation? Denies  Danger to Others  Danger to Others None reported or observed

## 2021-01-01 NOTE — BHH Counselor (Signed)
CSW attempted to contact Amy [phone] with [employer] to discuss any new details and possible benefits for the patient.  Amy was not available.  CSW left a voicemail asking for a return call.  CSW will attempt to contact Amy tomorrow to follow up on possible patient benefits.

## 2021-01-02 MED ORDER — RISPERIDONE 2 MG PO TBDP
4.0000 mg | ORAL_TABLET | Freq: Every day | ORAL | Status: DC
  Filled 2021-01-02: qty 2

## 2021-01-02 MED ORDER — RISPERIDONE 2 MG PO TBDP
3.0000 mg | ORAL_TABLET | Freq: Every day | ORAL | Status: DC
  Administered 2021-01-02: 3 mg via ORAL
  Filled 2021-01-02 (×2): qty 1

## 2021-01-02 NOTE — BHH Group Notes (Signed)
Type of Therapy and Topic:  Group Therapy:  Healthy and Unhealthy Supports   Participation Level:  Active    Description of Group:  Patients in this group were introduced to the idea of adding a variety of healthy supports to address the various needs in their lives. Patients discussed what additional healthy supports could be helpful in their recovery and wellness after discharge in order to prevent future hospitalizations.   An emphasis was placed on using counselor, doctor, therapy groups, 12-step groups, and problem-specific support groups to expand supports.  They also worked as a group on developing a specific plan for several patients to deal with unhealthy supports through boundary-setting, psychoeducation with loved ones, and even termination of relationships.   Therapeutic Goals:               1)  discuss importance of adding supports to stay well once out of the hospital             2)  compare healthy versus unhealthy supports and identify some examples of each             3)  generate ideas and descriptions of healthy supports that can be added             4)  offer mutual support about how to address unhealthy supports             5)  encourage active participation in and adherence to discharge plan               Summary of Patient Progress: Worksheets were provided and patient was given the opportunity to ask questions.    Therapeutic Modalities:   Motivational Interviewing Brief Solution-Focused Therapy 

## 2021-01-02 NOTE — Progress Notes (Signed)
Nurse discussed coping skills with patient.  

## 2021-01-02 NOTE — Progress Notes (Signed)
[facility] MD Progress Note  01/02/2021 3:20 PM Anthony Knox  MRN:  [id number]   Subjective: Anthony Knox reports, "I'm doing fine".  Reason for admission: The patient is a 60 year old male with a reported past psychiatric history of schizophrenia who was admitted because "my brother felt I was not taking my mental health medication and brought me here."  Chart notes document that he was irritable, disorganized, tangential and paranoid on admission. Daily notes: Patient is seen, chart reviewed. The chart findings discussed with the treatment team. Anthony Knox is sitting on his bed in his room. He is making a good eye contact. He says he is doing fine. He adds that he has been in this hospital for a long time, hoping he will get discharged soon. Anthony Knox is informed that the SW is working on his discharge disposition plans which is currently in progress. He currently denies any symptoms of depression or anxiety. He denies any SIHI, AVH, delusional thoughts or paranoia. Anthony Knox received his second dose of the invega shot today. His Risperdal M-tabs is decreased from 5 mg to 3 mg today as this was intended to start after he has received his second dose of the Invega 156 mg shot today. This Risperdal M-tab is currently being tapered down to eventual discontinuation. Patient tolerated Invega injection today. Denies any adverse effects or reactions so far.   (Per previous notes)Objective: Medical record reviewed.  Patient's case discussed in detail with members of the treatment team.  I met with and evaluated the patient today for follow-up on the unit.  Team social worker was present during our conversation to participate in discussion about living arrangements after discharge.  Patient appears much the same although appears less tired with less motor slowing.  He continues to report euthymic mood, displays constricted affect and has coherent thought processes with some paranoid ideation that appears limited to his brother.  It  is unclear to what degree patient's concerns about his brother are reality based if at all since the 2 of them appear to have a difficult relationship.  Patient states that he does not want his brother involved in his care after discharge.  Patient's brother has filed for guardianship and patient should be served with the papers regarding the guardianship hearing within the next day or 2.  Team social worker discussed this matter with patient.  He denies SI, AI, PI, AH or VH.  He denies any problems with his medication.  He denies any physical problems.  Patient stated desire to go to skilled nursing facility after discharge or to live independently at home without the involvement of his brother but with the assistance of community agencies who can help him with transportation and meals until he gets an ACT team.  He will receive his second [place] loading dose tomorrow.  Social work is attempting to get patient connected with an ACT team but there is a waiting list and direct connection may not be able to be made until after patient leaves the hospital.  The patient slept 6.75 hours last night.  No new labs.  Patient is taking scheduled medications as prescribed and has not required any PRN medications.  Patient continues with bradycardia which he has had throughout this hospital course.  His pulse this morning was 59, BP was 112/68 and temperature was 97.7.  Principal Problem: Schizoaffective disorder ([place])  Diagnosis: Principal Problem:   Schizoaffective disorder ([place]) Active Problems:   MDD (major depressive disorder), recurrent episode, severe ([place])  Total  Time spent with patient: 15 minutes  Past Psychiatric History: See admission H&P  Past Medical History:  Past Medical History:  Diagnosis Date   Schizophrenia ([place])    History reviewed. No pertinent surgical history.  Family History: History reviewed. No pertinent family history.  Family Psychiatric  History: See admission  H&P  Social History:  Social History   Substance and Sexual Activity  Alcohol Use Not Currently     Social History   Substance and Sexual Activity  Drug Use Never    Social History   Socioeconomic History   Marital status: Single    Spouse name: Not on file   Number of children: Not on file   Years of education: Not on file   Highest education level: Not on file  Occupational History   Not on file  Tobacco Use   Smoking status: Never   Smokeless tobacco: Never  Vaping Use   Vaping Use: Never used  Substance and Sexual Activity   Alcohol use: Not Currently   Drug use: Never   Sexual activity: Not Currently  Other Topics Concern   Not on file  Social History Narrative   Not on file   Social Determinants of Health   Financial Resource Strain: Not on file  Food Insecurity: Not on file  Transportation Needs: Not on file  Physical Activity: Not on file  Stress: Not on file  Social Connections: Not on file   Additional Social History:   Sleep: Good  Appetite:  Good  Current Medications: Current Facility-Administered Medications  Medication Dose Route Frequency Provider Last Rate Last Admin   acetaminophen (TYLENOL) tablet 650 mg  650 mg Oral Q6H PRN Damita Dunnings B, MD   650 mg at 12/18/20 0959   alum & mag hydroxide-simeth (MAALOX/MYLANTA) 200-200-20 MG/5ML suspension 30 mL  30 mL Oral Q4H PRN Damita Dunnings B, MD       benztropine (COGENTIN) tablet 0.5 mg  0.5 mg Oral Q6H PRN Arthor Captain, MD       benztropine (COGENTIN) tablet 0.5 mg  0.5 mg Oral Daily Arthor Captain, MD   0.5 mg at 01/02/21 0820   benztropine (COGENTIN) tablet 0.5 mg  0.5 mg Oral QHS Arthor Captain, MD   0.5 mg at 01/01/21 1948   diphenhydrAMINE (BENADRYL) injection 50 mg  50 mg Intramuscular Q6H PRN Arthor Captain, MD       divalproex (DEPAKOTE ER) 24 hr tablet 1,250 mg  1,250 mg Oral QHS Onuoha, Josephine C, NP   1,250 mg at 01/01/21 2105   hydrOXYzine (ATARAX/VISTARIL) tablet 25 mg   25 mg Oral Q6H PRN Arthor Captain, MD   25 mg at 12/20/20 0759   levothyroxine (SYNTHROID) tablet 50 mcg  50 mcg Oral Q0600 Sharma Covert, MD   50 mcg at 01/02/21 0539   magnesium hydroxide (MILK OF MAGNESIA) suspension 30 mL  30 mL Oral Daily PRN Damita Dunnings B, MD       polyethylene glycol (MIRALAX / GLYCOLAX) packet 17 g  17 g Oral Daily PRN Sharma Covert, MD       ramelteon (ROZEREM) tablet 8 mg  8 mg Oral QHS Suella Broad, FNP   8 mg at 01/01/21 2106   risperiDONE (RISPERDAL M-TABS) disintegrating tablet 5 mg  5 mg Oral QHS Arthor Captain, MD   5 mg at 01/01/21 1949   senna (SENOKOT) tablet 8.6 mg  1 tablet Oral Daily Suella Broad, FNP  8.6 mg at 01/02/21 5631   Lab Results:  No results found for this or any previous visit (from the past 48 hour(s)).  Blood Alcohol level:  Lab Results  Component Value Date   ETH <10 06/18/2018   ETH <10 49/70/2637   Metabolic Disorder Labs: Lab Results  Component Value Date   HGBA1C 5.9 (H) 12/07/2020   MPG 123 12/07/2020   MPG 122.63 05/08/2018   Lab Results  Component Value Date   PROLACTIN 34.1 (H) 05/08/2018   Lab Results  Component Value Date   CHOL 170 12/07/2020   TRIG 56 12/07/2020   HDL 32 (L) 12/07/2020   CHOLHDL 5.3 12/07/2020   VLDL 11 12/07/2020   LDLCALC 127 (H) 12/07/2020   LDLCALC 87 05/08/2018   Physical Findings: AIMS: Facial and Oral Movements Muscles of Facial Expression: None, normal Lips and Perioral Area: None, normal Jaw: None, normal Tongue: None, normal,Extremity Movements Upper (arms, wrists, hands, fingers): None, normal Lower (legs, knees, ankles, toes): None, normal, Trunk Movements Neck, shoulders, hips: None, normal, Overall Severity Severity of abnormal movements (highest score from questions above): None, normal Incapacitation due to abnormal movements: None, normal Patient's awareness of abnormal movements (rate only patient's report): No Awareness, Dental  Status Current problems with teeth and/or dentures?: No Does patient usually wear dentures?: No  CIWA:    COWS:     Musculoskeletal: Strength & Muscle Tone: within normal limits Gait & Station: Not tested; patient reports he no longer feels off balance when he walks Patient leans: N/A  Psychiatric Specialty Exam:  Presentation  General Appearance: Appropriate for Environment  Eye Contact:Fair  Speech:Clear and Coherent  Speech Volume:Normal  Handedness:Right   Mood and Affect  Mood:Euthymic  Affect:Constricted  Thought Process  Thought Processes:Coherent; Goal Directed  Descriptions of Associations:Intact  Orientation:Full (Time, Place and Person)  Thought Content:Paranoid Ideation (Patient has some paranoid ideation regarding brother; unclear to what degree his concerns about brother may be reality based)  History of Schizophrenia/Schizoaffective disorder:Yes  Duration of Psychotic Symptoms:Greater than six months  Hallucinations:Hallucinations: None  Ideas of Reference:None  Suicidal Thoughts:Suicidal Thoughts: No  Homicidal Thoughts:Homicidal Thoughts: No  Sensorium  Memory:Immediate Fair; Recent Fair; Remote Fair  Judgment:Fair  Insight:Shallow  Executive Functions  Concentration:Fair  Attention Span:Fair  [place]  Language:Good  Psychomotor Activity  Psychomotor Activity:Psychomotor Activity: Normal  Assets  Assets:Communication Skills; Desire for Improvement; Housing; Resilience; Social Support  Sleep  Sleep:Sleep: Good Number of Hours of Sleep: 6.75  Physical Exam: Physical Exam Vitals and nursing note reviewed.  Constitutional:      General: He is not in acute distress. HENT:     Head: Normocephalic and atraumatic.     Mouth/Throat:     Pharynx: Oropharynx is clear.  Eyes:     Pupils: Pupils are equal, round, and reactive to light.  Cardiovascular:     Rate and Rhythm: Normal rate.      Pulses: Normal pulses.  Pulmonary:     Effort: Pulmonary effort is normal.  Genitourinary:    Comments: Deferred Musculoskeletal:        General: Normal range of motion.     Cervical back: Normal range of motion.  Skin:    General: Skin is warm and dry.  Neurological:     General: No focal deficit present.     Mental Status: He is alert and oriented to person, place, and time.   Review of Systems  Constitutional:  Negative for diaphoresis and fever.  HENT: Negative.  Negative for congestion and sore throat.   Eyes: Negative.   Respiratory: Negative.    Cardiovascular: Negative.   Gastrointestinal: Negative.  Negative for nausea and vomiting.  Genitourinary: Negative.   Musculoskeletal: Negative.   Skin:  Negative for rash.  Neurological: Negative.  Negative for tremors and seizures.  Psychiatric/Behavioral:  Negative for depression, hallucinations and suicidal ideas. The patient does not have insomnia.    Patient refused vital signs this morning Blood pressure 110/75, pulse (!) 58, temperature 97.9 F (36.6 C), temperature source Oral, resp. rate 16, height _0  (1.702 m), weight 79.8 kg, SpO2 96 %. Body mass index is 27.57 kg/m.   Treatment Plan Summary: Daily contact with patient to assess and evaluate symptoms and progress in treatment and Medication management.   Continue inpatient hospitalization.  Will continue today 01/02/2021 plan as below except where it is noted.   Continue every 15-minute observation status Encouraged participation in group therapy and therapeutic milieu  Schizoaffective disorder, currently depressed -Continue Invega Sustenna LAI with second loading dose of 156 mg IM to be given tomorrow 01/02/2021.  First loading dose of 234 mg IM was administered today 12/29/2020.  We will continue q. 28-day dosing after 01/02/2021 dose. -Decreased Risperdal from 5 mg to 3 mg po at bedtime.  Risperdal is being used for mood stabilization and psychotic symptoms.   Plan is for eventual taper and discontinuation of oral Risperdal after Kirt Boys second loading dose administered.  -Continue Depakote 1250 ER at bedtime for mood stabilization.  -Continue Cogentin 0.5 mg each morning and 0.5 mg at bedtime for EPS  Anxiety -Continue hydroxyzine 25 mg every 6 hours PRN anxiety or sleep  Insomnia -Continue Rozerem 8 mg at bedtime for sleep  Hypothyroidism -Continue Synthroid 50 mcg daily  Constipation -Continue MiraLAX PRN -Continue Senokot 8.6 mg daily  Cardiology outpatient follow-up -Spoke with cardiology consultant on 12/25/2020 and was advised that patient needs to call the [facility] health medical group cardiology department at [phone] after discharge to schedule an appointment with cardiology.    Discharge planning in progress.  Social work is trying to clarify whether patient has an ACT team and determine housing plan for patient.  There may be a waiting list for ACT team and patient may not get connected directly with ACT team prior to discharge.  Social work is also attempting to set up hospital transitions team and other support and resources outside the hospital for patient in preparation for discharge.  Patient is considering ALF or SNF.     Lindell Spar, NP, pmhnp, fnp-bc 01/02/2021, 3:20 PM

## 2021-01-02 NOTE — Progress Notes (Signed)
Patient states that he had a good day overall since he ate and slept. His goal for tomorrow is to "keep things simple".

## 2021-01-02 NOTE — Progress Notes (Addendum)
D:  Patient denied SI and HI, contracts for safety.  Denied A/V hallucinations.  Denied pain. A:  Medications administered per MD orders.  Emotional support and encouragement given patient. R:  Safety maintained with 15 minute checks.  

## 2021-01-02 NOTE — Progress Notes (Signed)
Daryll was resting in bed when I arrived but did go to group and had snack in dayroom with his peers. No interaction with peers and minimal interaction with staff. Denies S.I. and denies hallucinations. Reports he is here at the hospital after his brother made false accusations.

## 2021-01-02 NOTE — Tx Team (Signed)
Interdisciplinary Treatment and Diagnostic Plan Update  01/02/2021 Time of Session: 1:20pm Anthony Knox MRN: [id number]  Principal Diagnosis: Schizoaffective disorder [facility])  Secondary Diagnoses: Principal Problem:   Schizoaffective disorder (HCC) Active Problems:   MDD (major depressive disorder), recurrent episode, severe (HCC)   Current Medications:  Current Facility-Administered Medications  Medication Dose Route Frequency Provider Last Rate Last Admin   acetaminophen (TYLENOL) tablet 650 mg  650 mg Oral Q6H PRN Eliseo Gum B, MD   650 mg at 12/18/20 0959   alum & mag hydroxide-simeth (MAALOX/MYLANTA) 200-200-20 MG/5ML suspension 30 mL  30 mL Oral Q4H PRN Eliseo Gum B, MD       benztropine (COGENTIN) tablet 0.5 mg  0.5 mg Oral Q6H PRN Claudie Revering, MD       benztropine (COGENTIN) tablet 0.5 mg  0.5 mg Oral Daily Claudie Revering, MD   0.5 mg at 01/02/21 0820   benztropine (COGENTIN) tablet 0.5 mg  0.5 mg Oral QHS Claudie Revering, MD   0.5 mg at 01/01/21 1948   diphenhydrAMINE (BENADRYL) injection 50 mg  50 mg Intramuscular Q6H PRN Claudie Revering, MD       divalproex (DEPAKOTE ER) 24 hr tablet 1,250 mg  1,250 mg Oral QHS Onuoha, Josephine C, NP   1,250 mg at 01/01/21 2105   hydrOXYzine (ATARAX/VISTARIL) tablet 25 mg  25 mg Oral Q6H PRN Claudie Revering, MD   25 mg at 12/20/20 0759   levothyroxine (SYNTHROID) tablet 50 mcg  50 mcg Oral Q0600 Antonieta Pert, MD   50 mcg at 01/02/21 0539   magnesium hydroxide (MILK OF MAGNESIA) suspension 30 mL  30 mL Oral Daily PRN Eliseo Gum B, MD       paliperidone (INVEGA SUSTENNA) injection 156 mg  156 mg Intramuscular Once Claudie Revering, MD       polyethylene glycol (MIRALAX / GLYCOLAX) packet 17 g  17 g Oral Daily PRN Antonieta Pert, MD       ramelteon (ROZEREM) tablet 8 mg  8 mg Oral QHS [name], FNP   8 mg at 01/01/21 2106   risperiDONE (RISPERDAL M-TABS) disintegrating tablet 5 mg  5 mg Oral QHS Claudie Revering, MD   5 mg at 01/01/21 1949   senna (SENOKOT) tablet 8.6 mg  1 tablet Oral Daily [name], FNP   8.6 mg at 01/02/21 1638   PTA Medications: Medications Prior to Admission  Medication Sig Dispense Refill Last Dose   benztropine (COGENTIN) 0.5 MG tablet Take 1 tablet (0.5 mg total) by mouth 2 (two) times daily. For EPS 60 tablet 0    divalproex (DEPAKOTE) 500 MG DR tablet Take 2 tablets (1,000 mg total) by mouth 2 (two) times daily.      docusate sodium (COLACE) 100 MG capsule Take 100 mg by mouth daily.      hydrOXYzine (ATARAX/VISTARIL) 50 MG tablet Take 1 tablet (50 mg total) by mouth every 4 (four) hours as needed for anxiety (Sleep). 30 tablet 0    polyethylene glycol (MIRALAX / GLYCOLAX) 17 g packet Take 17 g by mouth daily. 14 each 0    ramelteon (ROZEREM) 8 MG tablet Take 1 tablet (8 mg total) by mouth at bedtime.      risperiDONE (RISPERDAL M-TABS) 2 MG disintegrating tablet Take 1 tablet (2 mg total) by mouth 2 (two) times daily.       Patient Stressors: Health problems Medication change or noncompliance  Patient  Strengths: Ability for insight Capable of independent living Motivation for treatment/growth Physical Health Supportive family/friends  Treatment Modalities: Medication Management, Group therapy, Case management,  1 to 1 session with clinician, Psychoeducation, Recreational therapy.   Physician Treatment Plan for Primary Diagnosis: Schizoaffective disorder (HCC) Long Term Goal(s): Improvement in symptoms so as ready for discharge Improvement in symptoms so as ready for discharge   Short Term Goals: Ability to identify changes in lifestyle to reduce recurrence of condition will improve Ability to verbalize feelings will improve Ability to demonstrate self-control will improve Ability to identify and develop effective coping behaviors will improve Ability to maintain clinical measurements within normal limits will improve Compliance with prescribed  medications will improve Ability to identify changes in lifestyle to reduce recurrence of condition will improve Ability to verbalize feelings will improve Ability to demonstrate self-control will improve Ability to identify and develop effective coping behaviors will improve Ability to maintain clinical measurements within normal limits will improve Compliance with prescribed medications will improve  Medication Management: Evaluate patient's response, side effects, and tolerance of medication regimen.  Therapeutic Interventions: 1 to 1 sessions, Unit Group sessions and Medication administration.  Evaluation of Outcomes: Progressing  Physician Treatment Plan for Secondary Diagnosis: Principal Problem:   Schizoaffective disorder (HCC) Active Problems:   MDD (major depressive disorder), recurrent episode, severe (HCC)  Long Term Goal(s): Improvement in symptoms so as ready for discharge Improvement in symptoms so as ready for discharge   Short Term Goals: Ability to identify changes in lifestyle to reduce recurrence of condition will improve Ability to verbalize feelings will improve Ability to demonstrate self-control will improve Ability to identify and develop effective coping behaviors will improve Ability to maintain clinical measurements within normal limits will improve Compliance with prescribed medications will improve Ability to identify changes in lifestyle to reduce recurrence of condition will improve Ability to verbalize feelings will improve Ability to demonstrate self-control will improve Ability to identify and develop effective coping behaviors will improve Ability to maintain clinical measurements within normal limits will improve Compliance with prescribed medications will improve     Medication Management: Evaluate patient's response, side effects, and tolerance of medication regimen.  Therapeutic Interventions: 1 to 1 sessions, Unit Group sessions and  Medication administration.  Evaluation of Outcomes: Progressing   RN Treatment Plan for Primary Diagnosis: Schizoaffective disorder (HCC) Long Term Goal(s): Knowledge of disease and therapeutic regimen to maintain health will improve  Short Term Goals: Ability to remain free from injury will improve, Ability to participate in decision making will improve and Ability to verbalize feelings will improve  Medication Management: RN will administer medications as ordered by provider, will assess and evaluate patient's response and provide education to patient for prescribed medication. RN will report any adverse and/or side effects to prescribing provider.  Therapeutic Interventions: 1 on 1 counseling sessions, Psychoeducation, Medication administration, Evaluate responses to treatment, Monitor vital signs and CBGs as ordered, Perform/monitor CIWA, COWS, AIMS and Fall Risk screenings as ordered, Perform wound care treatments as ordered.  Evaluation of Outcomes: Progressing   LCSW Treatment Plan for Primary Diagnosis: Schizoaffective disorder (HCC) Long Term Goal(s): Safe transition to appropriate next level of care at discharge, Engage patient in therapeutic group addressing interpersonal concerns.  Short Term Goals: Engage patient in aftercare planning with referrals and resources, Increase social support and Increase ability to appropriately verbalize feelings  Therapeutic Interventions: Assess for all discharge needs, 1 to 1 time with Social worker, Explore available resources and support systems, Assess for  adequacy in community support network, Educate family and significant other(s) on suicide prevention, Complete Psychosocial Assessment, Interpersonal group therapy.  Evaluation of Outcomes: Progressing   Progress in Treatment: Attending groups: Yes. Participating in groups: Yes. Taking medication as prescribed: Yes. Toleration medication: Yes. Family/Significant other contact made:  Yes, individual(s) contacted:  Brother  Patient understands diagnosis: Yes. Discussing patient identified problems/goals with staff: No. Medical problems stabilized or resolved: Yes. Denies suicidal/homicidal ideation: Yes. Issues/concerns per patient self-inventory: No. Other: None  New problem(s) identified: No, Describe:  None  New Short Term/Long Term Goal(s):medication stabilization, elimination of SI thoughts, development of comprehensive mental wellness plan.  Patient Goals:  Did not attend   Discharge Plan or Barriers:  Patient is to return to apartment and has been referred for ACT services.   Reason for Continuation of Hospitalization: Depression Medication stabilization  Estimated Length of Stay: 1-3 days  Attendees: Patient:  01/02/2021   Physician:  01/02/2021   Nursing:  01/02/2021   RN Care Manager: 01/02/2021   Social Worker: Ruthann Cancer, LCSW 01/02/2021   Recreational Therapist:  01/02/2021   Other:  01/02/2021   Other:  01/02/2021   Other: 01/02/2021       Scribe for Treatment Team: Felizardo Hoffmann, LCSWA 01/02/2021 10:01 AM

## 2021-01-02 NOTE — Progress Notes (Signed)
Recreation Therapy Notes  Date: 6.22.22 Time: 0930 Location: [address] Dayroom   Group Topic: Stress Management   Goal Area(s) Addresses: Patient will actively participate in stress management techniques presented during session. Patient will successfully identify benefit of practicing stress management post d/c.   Intervention: Guided exercise with ambient sound and script   Activity :Guided Imagery   LRT provided education, instruction, and demonstration on practice of visualization via guided imagery. Patient was asked to participate in the technique introduced during session. Patients were given suggestions of ways to access scripts post d/c and encouraged to explore Youtube and other apps available on smartphones, tablets, and computers.   Education:  Stress Management, Discharge Planning.   Education Outcome: Acknowledges education   Clinical Observations/Feedback: Patient did not attend group session.      Caroll Rancher, LRT/CTRS         Caroll Rancher A 01/02/2021 11:42 AM

## 2021-01-03 MED ORDER — BENZTROPINE MESYLATE 0.5 MG PO TABS
0.5000 mg | ORAL_TABLET | ORAL | Status: DC
  Administered 2021-01-03 – 2021-01-04 (×2): 0.5 mg via ORAL
  Filled 2021-01-03 (×6): qty 14

## 2021-01-03 MED ORDER — RISPERIDONE 3 MG PO TBDP
3.0000 mg | ORAL_TABLET | Freq: Every day | ORAL | Status: DC
  Administered 2021-01-03: 3 mg via ORAL
  Filled 2021-01-03 (×2): qty 7

## 2021-01-03 NOTE — BHH Counselor (Signed)
CSW spoke with Mrs. Lambert Mody at [facility] ([facility]) and informed her that Mr. Strupp would be discharging from the hospital tomorrow 01/04/21.  Mrs. Lambert Mody states that she will be completing a home visit with Mr. Gorniak tomorrow and will be attempting to contact the brother again today.  She states that she attempted to contact Mr. Hustead brother earlier this week and received no answer or return call from the voicemail that she left for him.  Mrs. Lambert Mody states that Mr. Derosia case will continue to be open and that they will be working to provide him with additional services from his home.

## 2021-01-03 NOTE — Progress Notes (Signed)
[facility] MD Progress Note  01/03/2021 4:19 PM PRESS [place]  MRN:  [id number]   Subjective: Anthony Knox reports, "I'm doing alright. My mood is great".  Reason for admission: The patient is a 60 year old male with a reported past psychiatric history of schizophrenia who was admitted because "my brother felt I was not taking my mental health medication and brought me here."  Chart notes document that he was irritable, disorganized, tangential and paranoid on admission. Daily notes: Patient is seen, chart reviewed. The chart findings discussed with the treatment team. Anthony Knox is lying down in his bed in his room. He is making a good eye contact. He says he is doing alright. He adds that he has been in this hospital for a long time, hoping he will get discharged soon. Anthony Knox is informed that the SW is working on his discharge disposition plans which is currently in progress. He currently denies any symptoms of depression or anxiety. He denies any SIHI, AVH, delusional thoughts or paranoia. Anthony Knox received his second dose of the invega shot today. His Risperdal M-tabs is decreased from 5 mg to 3 mg today as this was intended to start after he has received his second dose of the Invega 156 mg shot today. This Risperdal M-tab is currently being tapered down to eventual discontinuation. Patient tolerated Invega injection today. Denies any adverse effects or reactions so far. His pulse this morning was 52, BP was 131/96 and temperature was 98.1  (Per previous notes)Objective: Medical record reviewed.  Patient's case discussed in detail with members of the treatment team.  I met with and evaluated the patient today for follow-up on the unit.  Team social worker was present during our conversation to participate in discussion about living arrangements after discharge.  Patient appears much the same although appears less tired with less motor slowing.  He continues to report euthymic mood, displays constricted affect and has  coherent thought processes with some paranoid ideation that appears limited to his brother.  It is unclear to what degree patient's concerns about his brother are reality based if at all since the 2 of them appear to have a difficult relationship.  Patient states that he does not want his brother involved in his care after discharge.  Patient's brother has filed for guardianship and patient should be served with the papers regarding the guardianship hearing within the next day or 2.  Team social worker discussed this matter with patient.  He denies SI, AI, PI, AH or VH.  He denies any problems with his medication.  He denies any physical problems.  Patient stated desire to go to skilled nursing facility after discharge or to live independently at home without the involvement of his brother but with the assistance of community agencies who can help him with transportation and meals until he gets an ACT team.  He will receive his second [place] loading dose tomorrow.  Social work is attempting to get patient connected with an ACT team but there is a waiting list and direct connection may not be able to be made until after patient leaves the hospital.  The patient slept 6.75 hours last night.  No new labs.  Patient is taking scheduled medications as prescribed and has not required any PRN medications.  Patient continues with bradycardia which he has had throughout this hospital course.  His pulse this morning was 59, BP was 131/96 and temperature was 98.1  Principal Problem: Schizoaffective disorder (HCC)  Diagnosis: Principal Problem:  Schizoaffective disorder ([place]) Active Problems:   MDD (major depressive disorder), recurrent episode, severe ([place])  Total Time spent with patient: 15 minutes  Past Psychiatric History: See admission H&P  Past Medical History:  Past Medical History:  Diagnosis Date   Schizophrenia ([place])    History reviewed. No pertinent surgical history.  Family History:  History reviewed. No pertinent family history.  Family Psychiatric  History: See admission H&P  Social History:  Social History   Substance and Sexual Activity  Alcohol Use Not Currently     Social History   Substance and Sexual Activity  Drug Use Never    Social History   Socioeconomic History   Marital status: Single    Spouse name: Not on file   Number of children: Not on file   Years of education: Not on file   Highest education level: Not on file  Occupational History   Not on file  Tobacco Use   Smoking status: Never   Smokeless tobacco: Never  Vaping Use   Vaping Use: Never used  Substance and Sexual Activity   Alcohol use: Not Currently   Drug use: Never   Sexual activity: Not Currently  Other Topics Concern   Not on file  Social History Narrative   Not on file   Social Determinants of Health   Financial Resource Strain: Not on file  Food Insecurity: Not on file  Transportation Needs: Not on file  Physical Activity: Not on file  Stress: Not on file  Social Connections: Not on file   Additional Social History:   Sleep: Good  Appetite:  Good  Current Medications: Current Facility-Administered Medications  Medication Dose Route Frequency Provider Last Rate Last Admin   acetaminophen (TYLENOL) tablet 650 mg  650 mg Oral Q6H PRN Damita Dunnings B, MD   650 mg at 12/18/20 0959   alum & mag hydroxide-simeth (MAALOX/MYLANTA) 200-200-20 MG/5ML suspension 30 mL  30 mL Oral Q4H PRN Damita Dunnings B, MD       benztropine (COGENTIN) tablet 0.5 mg  0.5 mg Oral Q6H PRN Arthor Captain, MD       benztropine (COGENTIN) tablet 0.5 mg  0.5 mg Oral Haroldine Laws, MD       diphenhydrAMINE (BENADRYL) injection 50 mg  50 mg Intramuscular Q6H PRN Arthor Captain, MD       divalproex (DEPAKOTE ER) 24 hr tablet 1,250 mg  1,250 mg Oral QHS [place], Josephine C, NP   1,250 mg at 01/02/21 2145   hydrOXYzine (ATARAX/VISTARIL) tablet 25 mg  25 mg Oral Q6H PRN Arthor Captain, MD   25 mg at 12/20/20 0759   levothyroxine (SYNTHROID) tablet 50 mcg  50 mcg Oral Q0600 Sharma Covert, MD   50 mcg at 01/03/21 5643   magnesium hydroxide (MILK OF MAGNESIA) suspension 30 mL  30 mL Oral Daily PRN Damita Dunnings B, MD       polyethylene glycol (MIRALAX / GLYCOLAX) packet 17 g  17 g Oral Daily PRN Sharma Covert, MD       ramelteon (ROZEREM) tablet 8 mg  8 mg Oral QHS Suella Broad, FNP   8 mg at 01/02/21 2145   risperiDONE (RISPERDAL M-TABS) disintegrating tablet 3 mg  3 mg Oral QHS Arthor Captain, MD       senna (SENOKOT) tablet 8.6 mg  1 tablet Oral Daily Suella Broad, FNP   8.6 mg at 01/03/21 0900   Lab Results:  No results found for this or any previous visit (from the past 48 hour(s)).  Blood Alcohol level:  Lab Results  Component Value Date   ETH <10 06/18/2018   ETH <10 63/78/5885   Metabolic Disorder Labs: Lab Results  Component Value Date   HGBA1C 5.9 (H) 12/07/2020   MPG 123 12/07/2020   MPG 122.63 05/08/2018   Lab Results  Component Value Date   PROLACTIN 34.1 (H) 05/08/2018   Lab Results  Component Value Date   CHOL 170 12/07/2020   TRIG 56 12/07/2020   HDL 32 (L) 12/07/2020   CHOLHDL 5.3 12/07/2020   VLDL 11 12/07/2020   LDLCALC 127 (H) 12/07/2020   LDLCALC 87 05/08/2018   Physical Findings: AIMS: Facial and Oral Movements Muscles of Facial Expression: None, normal Lips and Perioral Area: None, normal Jaw: None, normal Tongue: None, normal,Extremity Movements Upper (arms, wrists, hands, fingers): None, normal Lower (legs, knees, ankles, toes): None, normal, Trunk Movements Neck, shoulders, hips: None, normal, Overall Severity Severity of abnormal movements (highest score from questions above): None, normal Incapacitation due to abnormal movements: None, normal Patient's awareness of abnormal movements (rate only patient's report): No Awareness, Dental Status Current problems with teeth and/or  dentures?: No Does patient usually wear dentures?: No  CIWA:    COWS:     Musculoskeletal: Strength & Muscle Tone: within normal limits Gait & Station: Not tested; patient reports he no longer feels off balance when he walks Patient leans: N/A  Psychiatric Specialty Exam:  Presentation  General Appearance: Appropriate for Environment  Eye Contact:Fair  Speech:Clear and Coherent  Speech Volume:Normal  Handedness:Right   Mood and Affect  Mood:Euthymic  Affect:Constricted  Thought Process  Thought Processes:Coherent; Goal Directed  Descriptions of Associations:Intact  Orientation:Full (Time, Place and Person)  Thought Content:Paranoid Ideation (Patient has some paranoid ideation regarding brother; unclear to what degree his concerns about brother may be reality based)  History of Schizophrenia/Schizoaffective disorder:Yes  Duration of Psychotic Symptoms:Greater than six months  Hallucinations:No data recorded  Ideas of Reference:None  Suicidal Thoughts:No data recorded  Homicidal Thoughts:No data recorded  Sensorium  Memory:Immediate Fair; Recent Fair; Remote Fair  Judgment:Fair  Insight:Shallow  Executive Functions  Concentration:Fair  Attention Span:Fair  [place]  Language:Good  Psychomotor Activity  Psychomotor Activity:No data recorded  Assets  Assets:Communication Skills; Desire for Improvement; Housing; Resilience; Social Support  Sleep  Sleep:No data recorded  Physical Exam: Physical Exam Vitals and nursing note reviewed.  Constitutional:      General: He is not in acute distress. HENT:     Head: Normocephalic and atraumatic.     Mouth/Throat:     Pharynx: Oropharynx is clear.  Eyes:     Pupils: Pupils are equal, round, and reactive to light.  Cardiovascular:     Rate and Rhythm: Normal rate.     Pulses: Normal pulses.  Pulmonary:     Effort: Pulmonary effort is normal.  Genitourinary:     Comments: Deferred Musculoskeletal:        General: Normal range of motion.     Cervical back: Normal range of motion.  Skin:    General: Skin is warm and dry.  Neurological:     General: No focal deficit present.     Mental Status: He is alert and oriented to person, place, and time.   Review of Systems  Constitutional:  Negative for diaphoresis and fever.  HENT: Negative.  Negative for congestion and sore throat.   Eyes: Negative.  Respiratory: Negative.    Cardiovascular: Negative.   Gastrointestinal: Negative.  Negative for nausea and vomiting.  Genitourinary: Negative.   Musculoskeletal: Negative.   Skin:  Negative for rash.  Neurological: Negative.  Negative for tremors and seizures.  Psychiatric/Behavioral:  Negative for depression, hallucinations and suicidal ideas. The patient does not have insomnia.    Patient refused vital signs this morning Blood pressure (!) 131/96, pulse (!) 52, temperature 98.1 F (36.7 C), temperature source Oral, resp. rate 16, height '5\' 7"'  (1.702 m), weight 79.8 kg, SpO2 100 %. Body mass index is 27.57 kg/m.   Treatment Plan Summary: Daily contact with patient to assess and evaluate symptoms and progress in treatment and Medication management.   Continue inpatient hospitalization.  Will continue today 01/03/2021 plan as below except where it is noted.   Continue every 15-minute observation status Encouraged participation in group therapy and therapeutic milieu  Schizoaffective disorder, currently depressed -Continue Invega Sustenna LAI with second loading dose of 156 mg IM to be given tomorrow 01/02/2021.  First loading dose of 234 mg IM was administered today 12/29/2020.  We will continue q. 28-day dosing after 01/02/2021 dose. -Decreased Risperdal from 5 mg to 2 mg po at bedtime.  Risperdal is being used for mood stabilization and psychotic symptoms.  Plan is for eventual taper and discontinuation of oral Risperdal after Kirt Boys  second loading dose administered.  -Continue Depakote 1250 ER at bedtime for mood stabilization.  -Continue Cogentin 0.5 mg each morning and 0.5 mg at bedtime for EPS  Anxiety -Continue hydroxyzine 25 mg every 6 hours PRN anxiety or sleep  Insomnia -Continue Rozerem 8 mg at bedtime for sleep  Hypothyroidism -Continue Synthroid 50 mcg daily  Constipation -Continue MiraLAX PRN -Continue Senokot 8.6 mg daily  Cardiology outpatient follow-up -Spoke with cardiology consultant on 12/25/2020 and was advised that patient needs to call the [facility] cardiology department at [phone] after discharge to schedule an appointment with cardiology.    Discharge planning in progress.  Social work is trying to clarify whether patient has an ACT team and determine housing plan for patient.  There may be a waiting list for ACT team and patient may not get connected directly with ACT team prior to discharge.  Social work is also attempting to set up hospital transitions team and other support and resources outside the hospital for patient in preparation for discharge.  Patient is considering ALF or SNF.    Lindell Spar, NP, pmhnp, fnp-bc 01/03/2021, 4:19 PM

## 2021-01-03 NOTE — Progress Notes (Signed)
The patient's positive event for the day is that he didn't do "anything stupid". He did not explain any further. His goal for tomorrow is to be more awake and to be more social with his peers.

## 2021-01-03 NOTE — Progress Notes (Addendum)
   01/03/21 1300  Psych Admission Type (Psych Patients Only)  Admission Status Voluntary  Psychosocial Assessment  Patient Complaints None  Eye Contact Brief  Facial Expression Flat  Affect Depressed  Speech Logical/coherent  Interaction Cautious  Motor Activity Other (Comment) (WNL)  Appearance/Hygiene Unremarkable  Behavior Characteristics Cooperative;Anxious  Mood Anxious;Pleasant  Thought Process  Coherency WDL  Content WDL  Delusions None reported or observed  Perception WDL  Hallucination None reported or observed  Judgment Limited  Confusion None  Danger to Self  Current suicidal ideation? Denies  Danger to Others  Danger to Others None reported or observed    D. Pt has been calm and cooperative on the unit- per pt's self inventory, pt rated his depression, hopelessness and anxiety all 0's. Pt wrote that his goal today is "to be relaxed ", and "to apply positive thoughts". Pt currently denies SI/HI and AVH  A. Labs and vitals monitored. Pt compliant with medications. Pt supported emotionally and encouraged to express concerns and ask questions.   R. Pt remains safe with 15 minute checks. Will continue POC.

## 2021-01-03 NOTE — BHH Counselor (Signed)
CSW spoke with [facility] and was informed that Anthony Knox application was accepted and that they will be doing a home visit within the next week or 2 to see what services they can offer Anthony Knox.  They state that they will also be helping Anthony Knox apply for Medicaid.  [facility] also confirmed that they have spoken with Anthony Knox brother and will be calling to speak with him again about setting up services.

## 2021-01-03 NOTE — BHH Group Notes (Signed)
BHH Group Notes:  (Nursing)  Date:  01/03/2021  Time: 1600  Type of Therapy:  Psychoeducational Skills  Participation Level:  Active  Participation Quality:  Appropriate  Affect:  Appropriate  Cognitive:  Alert and Appropriate  Insight:  Appropriate  Engagement in Group:  Engaged  Modes of Intervention:  Discussion, Exploration, Rapport Building, Socialization, and Support  Summary of Progress/Problems: Group played a non competitive learning/communication game that fosters listening skills as well as self expression  Anthony Knox 01/03/2021, 6:15 PM

## 2021-01-03 NOTE — BHH Counselor (Addendum)
CSW spoke with [facility] and was informed that Anthony Knox (brother) spoke with them and told them that he did not need their services for his brother.  They stated that they will no longer be attempting to reach out to Mr. Anthony Knox since he does not have his own phone number but states that they are willing to work with Anthony Knox in the future if he feels that he still needs services on his own.  CSW attempted to contact Anthony Knox at both available number and did not receive any answer.  CSW left a voicemail informing Mr. Anthony Knox that his brother would be discharging from the hospital tomorrow and asking him to please let the CSW know what services he did in fact want his brother to receive since he Anthony Knox) refused all referred services.   At this time Anthony Knox is not the patient's legal guardian but the [facility] organization is unable to contact Anthony Knox due to his brother being in control of the bank account and is not allowing the patient to purchase his own telephone.     CSW also received the following information via email from [facility], along with a brochure to provide to Anthony Knox:   Anthony Knox,  As per my voice mail, the brother was not interested in hearing details about [facility] when we reached out. He could have been distracted at the time, but he did not allow explanation.  Would you share the attached information? We are happy to schedule a tour or discuss further if he is interested.  Thank you,  Anthony Knox

## 2021-01-04 ENCOUNTER — Ambulatory Visit: Payer: PPO | Admitting: Physician Assistant

## 2021-01-04 MED ORDER — RISPERIDONE 2 MG PO TABS: 2.0000 mg | ORAL_TABLET | Freq: Every day | ORAL | 0 refills | Status: DC

## 2021-01-04 MED ORDER — PALIPERIDONE PALMITATE ER 156 MG/ML IM SUSY: 156.0000 mg | PREFILLED_SYRINGE | INTRAMUSCULAR | 0 refills | Status: DC

## 2021-01-04 MED ORDER — SENNA 8.6 MG PO TABS: 1.0000 | ORAL_TABLET | Freq: Every day | ORAL | 0 refills | Status: DC

## 2021-01-04 MED ORDER — DIVALPROEX SODIUM ER 250 MG PO TB24: 1250.0000 mg | ORAL_TABLET | Freq: Every day | ORAL | 0 refills | Status: DC

## 2021-01-04 MED ORDER — HYDROXYZINE HCL 25 MG PO TABS: 25.0000 mg | ORAL_TABLET | Freq: Four times a day (QID) | ORAL | 0 refills | Status: DC | PRN

## 2021-01-04 MED ORDER — LEVOTHYROXINE SODIUM 50 MCG PO TABS: 50.0000 ug | ORAL_TABLET | Freq: Every day | ORAL | 0 refills | Status: DC

## 2021-01-04 MED ORDER — DIVALPROEX SODIUM ER 250 MG PO TB24
1250.0000 mg | ORAL_TABLET | Freq: Every day | ORAL | Status: DC
  Filled 2021-01-04 (×2): qty 35

## 2021-01-04 MED ORDER — RAMELTEON 8 MG PO TABS: 8.0000 mg | ORAL_TABLET | Freq: Every day | ORAL | 0 refills | Status: DC

## 2021-01-04 MED ORDER — PALIPERIDONE PALMITATE ER 156 MG/ML IM SUSY: 156.0000 mg | PREFILLED_SYRINGE | INTRAMUSCULAR | Status: DC

## 2021-01-04 MED ORDER — RISPERIDONE 2 MG PO TABS
2.0000 mg | ORAL_TABLET | Freq: Every day | ORAL | Status: DC
  Filled 2021-01-04 (×2): qty 7

## 2021-01-04 MED ORDER — BENZTROPINE MESYLATE 0.5 MG PO TABS: 0.5000 mg | ORAL_TABLET | ORAL | 0 refills | Status: DC

## 2021-01-04 NOTE — Progress Notes (Signed)
Adult Psychoeducational Group Note  Date:  01/04/2021 Time:  10:34 AM  Group Topic/Focus:  Goals Group:   The focus of this group is to help patients establish daily goals to achieve during treatment and discuss how the patient can incorporate goal setting into their daily lives to aide in recovery.  Participation Level:  Active  Participation Quality:  Appropriate  Affect:  Appropriate  Cognitive:  Appropriate  Insight: Appropriate  Engagement in Group:  Engaged  Modes of Intervention:  Discussion  Additional Comments:  Pt stated his goal for the day is to go slow and grow meaning take his time and listen to others.  [name] D 01/04/2021, 10:34 AM

## 2021-01-04 NOTE — [facility] Suicide Risk Assessment (Signed)
[facility] Discharge Suicide Risk Assessment   Principal Problem: Schizoaffective disorder [facility]) Discharge Diagnoses: Principal Problem:   Schizoaffective disorder (HCC) Active Problems:   MDD (major depressive disorder), recurrent episode, severe (HCC)   Total Time spent with patient: 20 minutes  Musculoskeletal: Strength & Muscle Tone: within normal limits Gait & Station: normal Patient leans: N/A  Psychiatric Specialty Exam  Presentation  General Appearance: Appropriate for Environment; Fairly Groomed  Eye Contact:Good  Speech:Clear and Coherent; Normal Rate  Speech Volume:Normal  Handedness:Right   Mood and Affect  Mood:Euthymic  Duration of Depression Symptoms: No data recorded Affect:Constricted   Thought Process  Thought Processes:Coherent; Goal Directed; Linear  Descriptions of Associations:Intact  Orientation:Full (Time, Place and Person)  Thought Content:Logical  History of Schizophrenia/Schizoaffective disorder:Yes  Duration of Psychotic Symptoms:Greater than six months  Hallucinations:Hallucinations: None  Ideas of Reference:None  Suicidal Thoughts:Suicidal Thoughts: No  Homicidal Thoughts:Homicidal Thoughts: No   Sensorium  Memory:Immediate Fair; Recent Fair; Remote Fair  Judgment:Fair  Insight:Shallow   Executive Functions  Concentration:Good  Attention Span:Good  Recall:Fair  Fund of Knowledge:Fair  Language:Good   Psychomotor Activity  Psychomotor Activity:Psychomotor Activity: Normal   Assets  Assets:Communication Skills; Desire for Improvement; Financial Resources/Insurance; Housing; Resilience   Sleep  Sleep:Sleep: Fair   Physical Exam: Physical Exam Vitals and nursing note reviewed.  Constitutional:      General: He is not in acute distress.    Appearance: Normal appearance. He is not diaphoretic.  HENT:     Head: Normocephalic and atraumatic.  Pulmonary:     Effort: Pulmonary effort is normal.   Neurological:     General: No focal deficit present.     Mental Status: He is alert and oriented to person, place, and time.   Review of Systems  Constitutional: Negative.   Respiratory: Negative.    Cardiovascular: Negative.   Gastrointestinal: Negative.   Musculoskeletal: Negative.   Neurological: Negative.   Psychiatric/Behavioral:  Negative for depression, hallucinations and suicidal ideas. The patient is not nervous/anxious.   Blood pressure 112/75, pulse (!) 55, temperature (!) 97.4 F (36.3 C), temperature source Oral, resp. rate 16, height 5\' 7"  (1.702 m), weight 79.8 kg, SpO2 97 %. Body mass index is 27.57 kg/m.  Mental Status Per Nursing Assessment::   On Admission:  NA  Demographic Factors:  Male, Caucasian, Living alone, and Unemployed  Loss Factors: NA  Historical Factors: Impulsivity  Risk Reduction Factors:   Positive social support, Positive therapeutic relationship; Support services in place to assist with transportation, obtaining medications and food, etc, after discharge; Pending ACTT Team referral  Continued Clinical Symptoms:  Schizoaffective Disorder vs Schizophrenia - good control of symptoms currently Previous Psychiatric Diagnoses and Treatments  Cognitive Features That Contribute To Risk:  Thought constriction (tunnel vision)    Suicide Risk:  Minimal: No identifiable suicidal ideation.  Patients presenting with no risk factors but with morbid ruminations; may be classified as minimal risk based on the severity of the depressive symptoms   Follow-up Information     [facility]. Go to.   Specialty: Behavioral Health Why: Please go to this provider for therapy and medication management services during walk in hours for new patients:  M - W, 8 am to 11 am for therapy and M - TH, 8 am to 11 am for medication management.   Contact information: [address] [place] [phone]        [facility] Follow up.  Why: Please contact this agency to schedule an appointment with your Cardiologist. Contact information: [address] [place] [address] [phone]        [facility]. Call.   Why: Please call this provider to schedule an appointment for primary care services. Contact information: [address] [place] [address] [phone]        [facility]. Call.   Why: Please call to secure transportation for medical services and help with food and other services. Contact information: Address: [address], [place], [place] [address]  Phone: [phone]        [facility]. Call.   Why: Please call to secure transportation services through this provider. Contact information: Address: [address] [place] [address]  Phone: [phone]        [facility]. Call.   Why: Drue Novel is your APS worker.  Please contact her to discuss your case. Contact information: Address: [address], [place], [place] [address]  Phone: [phone] and [phone].                Plan Of Care/Follow-up recommendations:  Activity:  as tolerated  Tests:  You will periodically need to have blood drawn for lab work to monitor cholesterol, blood sugar, liver function, blood count and Depakote level while taking your medications.  Your outpatient doctor will let you know when lab work needs to be performed.  Other:   -Take medications as prescribed.   -Go to outpatient mental health clinic every month to get your monthly injection of [place].  Your next Gean Birchwood dose is due approximately 01/30/2021.   -Do not drink alcohol.  Do not use marijuana/cannabis or other drugs.   -Keep outpatient mental health follow-up appointments with therapist and  psychiatrist.  -Call cardiology clinic at the number above to schedule an appointment and keep outpatient follow-up appointment with cardiology.   -Call the primary care clinic at the number above and schedule an appointment.  See your primary care provider for treatment of thyroid condition and other medical problems.  Claudie Revering, MD 01/04/2021, 9:55 AM

## 2021-01-04 NOTE — Progress Notes (Signed)
Pt discharged to lobby.Safe Transport present to take pt to his residence. Pt was stable and appreciative at that time. All papers, samples,and prescriptions were given and valuables returned. Verbal understanding expressed. Denies SI/HI and A/VH. Pt given opportunity to express concerns and ask questions.

## 2021-01-04 NOTE — Progress Notes (Signed)
Recreation Therapy Notes  Date:  6.24.22 Time: 0930 Location: [address] Dayroom   Group Topic: Stress Management   Goal Area(s) Addresses: Patient will identify positive stress management techniques. Patient will identify benefits of using stress management post d/c.   Intervention: Stress Management    Activity: Meditation.  LRT play a meditation for patients to calm and relax the body.     Education:  Stress Management, Discharge Planning.   Education Outcome: Acknowledges Education   Clinical Observations/Feedback:  LRT unable to do group due to fire drill and goals group running over time.     Caroll Rancher, LRT/CTRS         Caroll Rancher A 01/04/2021 10:51 AM

## 2021-01-04 NOTE — Progress Notes (Addendum)
   01/04/21 1020  Psych Admission Type (Psych Patients Only)  Admission Status Voluntary  Psychosocial Assessment  Patient Complaints None  Eye Contact Brief  Facial Expression Other (Comment) (WNL)  Affect Other (Comment) (WNL)  Speech Logical/coherent  Interaction No initiation  Motor Activity Other (Comment) (WNL)  Appearance/Hygiene Unremarkable  Behavior Characteristics Cooperative;Appropriate to situation  Mood Pleasant  Thought Process  Coherency WDL  Content WDL  Delusions None reported or observed  Perception WDL  Hallucination None reported or observed  Judgment Limited  Confusion None  Danger to Self  Current suicidal ideation? Denies  Danger to Others  Danger to Others None reported or observed   D. Pt has been appropriate on the unit- observed in the dayroom attending group. Per pt's self inventory, pt rated his depression, hopelessness and anxiety all 0's. Pt reports that his goal today is to work on "positive thoughts". Pt currently denies SI/HI and AVH and does not appear to be responding to internal stimuli. A. Labs and vitals monitored. Pt supported emotionally and encouraged to express concerns and ask questions.   R. Pt remains safe with 15 minute checks. Will continue POC.

## 2021-01-04 NOTE — Progress Notes (Signed)
  [facility] Adult Case Management Discharge Plan :  Will you be returning to the same living situation after discharge:  Yes,  Home At discharge, do you have transportation home?: Yes,  Safe Transport  Do you have the ability to pay for your medications: Yes,  Medicare   Release of information consent forms completed and in the chart;  Patient's signature needed at discharge.  Patient to Follow up at:  Follow-up Information     [facility]. Go to.   Specialty: Behavioral Health Why: Please go to this provider for therapy and medication management services during walk in hours for new patients:  M - W, 8 am to 11 am for therapy and M - TH, 8 am to 11 am for medication management.   Contact information: [address] [place] [address] [phone]        [facility] Follow up.   Why: Please contact this agency to schedule an appointment with your Cardiologist. Contact information: [address] [place] [address] [phone]        [facility]. Call.   Why: Please call this provider to schedule an appointment for primary care services. Contact information: [address] [place] [address] [phone]        [facility]. Call.   Why: Please call to secure transportation for medical services and help with food and other services. Contact information: Address: [address] [place], [place] [address]  Phone: [phone]        [facility]. Call.   Why: Please call to secure transportation services through this provider. Contact information: Address: [address] [place] [address]  Phone: [phone]        [facility]. Call.   Why: Drue Novel is your APS worker.  Please contact her to discuss your case. Contact  information: Address: [address] [place], [place] [address]  Phone: [phone] and [phone].                Next level of care provider has access to [facility] Link:yes  Safety Planning and Suicide Prevention discussed: Yes,  with patient and brother   Have you used any form of tobacco in the last 30 days? (Cigarettes, Smokeless Tobacco, Cigars, and/or Pipes): No  Has patient been referred to the Quitline?: N/A patient is not a smoker  Patient has been referred for addiction treatment: N/A  Aram Beecham, LCSWA 01/04/2021, 9:48 AM

## 2021-01-04 NOTE — Discharge Summary (Signed)
Physician Discharge Summary Note  Patient:  Anthony Knox is an 60 y.o., male MRN:  [id number] DOB:  1960-11-11 Patient phone:  [phone] (home)  Patient address:   [address] [place] [address],  Total Time spent with patient: 20 minutes  Date of Admission:  12/13/2020 Date of Discharge: 01/04/21  Reason for Admission: Worsening symptoms of Schizoaffective disorder/disorganization.  Principal Problem: Schizoaffective disorder [facility])  Discharge Diagnoses: Principal Problem:   Schizoaffective disorder (HCC) Active Problems:   MDD (major depressive disorder), recurrent episode, severe (HCC)  Past Psychiatric History: Schizoaffective disorder, Major depressive disorder Past Medical HistNonory:  Past Medical History:  Diagnosis Date   Schizophrenia (HCC)    History reviewed. No pertinent surgical history.  Family History: History reviewed. No pertinent family history.  Family Psychiatric  History: See H&P  Social History:  Social History   Substance and Sexual Activity  Alcohol Use Not Currently     Social History   Substance and Sexual Activity  Drug Use Never    Social History   Socioeconomic History   Marital status: Single    Spouse name: Not on file   Number of children: Not on file   Years of education: Not on file   Highest education level: Not on file  Occupational History   Not on file  Tobacco Use   Smoking status: Never   Smokeless tobacco: Never  Vaping Use   Vaping Use: Never used  Substance and Sexual Activity   Alcohol use: Not Currently   Drug use: Never   Sexual activity: Not Currently  Other Topics Concern   Not on file  Social History Narrative   Not on file   Social Determinants of Health   Financial Resource Strain: Not on file  Food Insecurity: Not on file  Transportation Needs: Not on file  Physical Activity: Not on file  Stress: Not on file  Social Connections: Not on file   Hospital Course: (Per Md's  admission evaluation notes): Patient is seen and examined.  Patient is a 60 year old male with a reported past psychiatric history significant for schizophrenia who originally presented to the behavioral health center assessment service on 12/07/2020.  The patient initially presented, but the patient stated at that time "I am sick".  He was noted to be irritable and stated he was leaving.  He attempted to exit the building, but returned in a matter of minutes with his brother Aidon Klemens.  The patient was noted to be alert, oriented, calm, cooperative and attentive.  He was noted to be clammy and diaphoretic.  The decision was made to transport him to the local emergency department at [facility].  On 12/06/2020 he was seen in the [facility] emergency department.  It was noted that he had been released from a period of incarceration.  He had presented to the behavioral health urgent care center for evaluation, and had a syncopal episode and was transferred to the emergency department.  In the emergency department he was found to be in atrial fibrillation with rapid ventricular response, and been placed on a diltiazem drip.  Unfortunately he became hypotensive.  Consultation to cardiology as well as the psychiatry were made.  Cardiology consultation recommended anticoagulation at least in the short run, and a transthoracic echocardiogram was ordered.  He was also seen by the critical care service.  His medications from a psychiatric perspective were taken from a list of medicines from December 2019.  He had been previously treated  with Tegretol, Risperdal and temazepam.  His echocardiogram was read, and there was no presence of clot.  It was felt that the patient was a poor candidate for rate control as well as anticoagulation.  Psychiatric consultation was obtained on 12/07/2020.  The patient was a poor historian, and was noted to be disorganized.  Review of the electronic medical record revealed that his last  psychiatric hospitalization had been in 2019 at our facility.  Recommendations were to start Risperdal, Depakote as well as Cogentin.  He was seen again by the consultation service on 12/09/2020.  It was noted that he had not made much improvement from a psychiatric perspective.  He was felt to be disorganized and tangential.  It was felt that he was still paranoid.  It was recommended to increase his Risperdal but no other medication changes.  It was also recommended to transfer to a psychiatric facility when a bed was available.  He was seen again on consultation on 12/10/2020.  His Risperdal was increased to 2 mg p.o. twice daily, but no other recommendations were made outside of inpatient admission.  Attempts to place the patient in an outside psychiatric facilities were not effective.  On 12/12/2020 recommendations were to increase Depakote 2000 mg p.o. twice daily.  No other changes were made.  He was transferred to our facility on 12/13/2020 for continued treatment.  On evaluation today, the patient is a poor historian.  Very vague.  He denied auditory or visual loose Nations.  He denied suicidal or homicidal ideation.  He stated that he had been released from prison for unknown circumstances 2 to 3 days prior to his admission to the medical hospital.  He stated he did have a home in the area.  He stated that for some reason his brother had not paid the bills on his living circumstances.  He was unsure what medications he had received in prison.  Review of the electronic medical record revealed his last admission in our facility was on 06/18/2018.  He was hospitalized for 6 days.  His discharge medications at that time included the long-acting Abilify injection, Cogentin, Tegretol, Benadryl, hydroxyzine, Risperdal 3 mg p.o. twice daily and temazepam.  He was admitted to our hospital for evaluation and stabilization.  His only complaint today is that the MiraLAX is leading to diarrhea.  This is one of several  psychiatric admissions[facility] this 60 year old male with hx of chronic mental illness, multiple psychiatric admissions & status post legal issues (has served a lot of years in prison). [facility] worsening symptoms of his mental illness. [facility] this [facility] around with complain of worsening symptoms of Schizoaffective disorder. [facility], Marcial Pacasimothy was noted to be disorganized & a poor historian. His UDS/BAL were all negative. Per chart review of [facility], it appears Marcial Pacasimothy may not have been compliant with his treatment regimen leading to exacerbation of his symptoms as it was noted that he has been tried on multiple psychotropic medications for his symptoms including a combination of two antipsychotic therapies.   After evaluation of his presenting symptoms, Marcial Pacasimothy was recommended for mood stabilization treatments. The medication regimen for his presenting symptoms were discussed & with his consent initiated. He received, stabilized & was discharged on the medications as listed below on his discharge medication lists. He was also enrolled & participated in the group counseling sessions  being offered & held on this unit. He learned coping skills. He presented on this admission, other other chronic medical conditions that required treatments & monitoring. He tolerated his treatment regimen without any adverse effects or reactions reported. As his treatment progressed, patient's brother indicated Jemario was not capable of taking care of himself as a result, an adult protective services (APS) reports were made to assure that Isaiyah will be able to have assistance to survive/thrive after discharge from this hospital. This case is currently ongoing.  And because of the chronic nature of his psychiatric symptoms, their resistance to the treatment regimen in the past & hx of medication non-compliance, Noble was  treated, stabilized & discharged on two separate antipsychotic medications (Risperdal tabs & Invega injectable). This is because he has not been able to achieve symptoms control under an antipsychotic monotherapy. However, a combination of two antipsychotic therapies seem effective in stabilizing his symptoms in the past & this time around. It will benefit him to continue on these combination antipsychotic therapies as recommended till his symptoms completely subside. He then can be titrated down to an antipsychotic monotherapy to help decrease the chance for development of metabolic syndrome usually associated with use of multiple antipsychotic therapies. This has to be done within the proper evaluation, judgement & discretion of his outpatient psychiatric provider.  During the course of this hospitalization, the 15-minute checks were adequate to ensure Pacer's safety.  Patient did not display any dangerous, violent or suicidal behavior on the unit. He interacted with patients & staff appropriately. He participated appropriately in the group sessions/therapies. His medications were addressed & adjusted to meet his needs. He was recommended for outpatient follow-up care & medication management upon discharge to assure his continuity of care.  At the time of discharge, patient is not reporting any acute suicidal/homicidal ideations. He currently denies any new issues or concerns. Education and supportive counseling provided throughout her hospital stay & upon discharge.  Today upon his discharge evaluation with the attending psychiatrist, Massie presents mentally & medically stable. He denies any other specific concerns. He is sleeping well. His appetite is good. He denies other physical complaints. He denies AH/VH, delusional thoughts or paranoia. He feels that his medications have been helpful & is in agreement to continue his current treatment regimen as recommended. He was able to engage in safety planning  including plan to return to [facility] or contact emergency services if he feels unable to maintain his own safety or the safety of others. Pt had no further questions, comments, or concerns. He left [facility] with all personal belongings in no apparent distress. Transportation per the safe transportation services.   Physical Findings: AIMS: Facial and Oral Movements Muscles of Facial Expression: None, normal Lips and Perioral Area: None, normal Jaw: None, normal Tongue: None, normal,Extremity Movements Upper (arms, wrists, hands, fingers): None, normal Lower (legs, knees, ankles, toes): None, normal, Trunk Movements Neck, shoulders, hips: None, normal, Overall Severity Severity of abnormal movements (highest score from questions above): None, normal Incapacitation due to abnormal movements: None, normal Patient's awareness of abnormal movements (rate only patient's report): No Awareness, Dental Status Current problems with teeth and/or dentures?: No Does patient usually wear dentures?: No  CIWA:    COWS:     Musculoskeletal: Strength & Muscle Tone: within normal limits Gait & Station: normal Patient leans: N/A  Psychiatric Specialty Exam: Physical Exam Vitals and nursing note reviewed.  Constitutional:      Appearance: He is well-developed.  HENT:  Head: Normocephalic.     Nose: Nose normal.     Mouth/Throat:     Pharynx: Oropharynx is clear.  Eyes:     Pupils: Pupils are equal, round, and reactive to light.  Cardiovascular:     Rate and Rhythm: Normal rate.     Pulses: Normal pulses.  Pulmonary:     Effort: Pulmonary effort is normal.  Genitourinary:    Comments: Deferred Musculoskeletal:        General: Normal range of motion.     Cervical back: Normal range of motion.  Skin:    General: Skin is warm.  Neurological:     General: No focal deficit present.     Mental Status: He is alert and oriented to person, place, and time. Mental status is at baseline.    Review of  Systems  Constitutional: Negative.   HENT: Negative.    Eyes: Negative.   Respiratory: Negative.    Cardiovascular: Negative.   Gastrointestinal: Negative.   Genitourinary: Negative.   Musculoskeletal: Negative.   Skin: Negative.   Neurological: Negative.   Endo/Heme/Allergies: Negative.   Psychiatric/Behavioral:  Positive for depression (Hx, of ((stable on medication)) and hallucinations (Hx of (stable on medication).). Negative for memory loss, substance abuse and suicidal ideas. The patient has insomnia (Hx (stable on medication).). The patient is not nervous/anxious (Stable upon discharge).    Blood pressure 112/75, pulse (!) 55, temperature (!) 97.4 F (36.3 C), temperature source Oral, resp. rate 16, height 5\' 7"  (1.702 m), weight 79.8 kg, SpO2 97 %.Body mass index is 27.57 kg/m.  General Appearance: See Md's discharge SRA  Sleep:  Number of Hours: 4.25   Have you used any form of tobacco in the last 30 days? (Cigarettes, Smokeless Tobacco, Cigars, and/or Pipes): No  Has this patient used any form of tobacco in the last 30 days? (Cigarettes, Smokeless Tobacco, Cigars, and/or Pipes): N/A  Blood Alcohol level:  Lab Results  Component Value Date   ETH <10 06/18/2018   ETH <10 05/05/2018    Metabolic Disorder Labs:  Lab Results  Component Value Date   HGBA1C 5.9 (H) 12/07/2020   MPG 123 12/07/2020   MPG 122.63 05/08/2018   Lab Results  Component Value Date   PROLACTIN 34.1 (H) 05/08/2018   Lab Results  Component Value Date   CHOL 170 12/07/2020   TRIG 56 12/07/2020   HDL 32 (L) 12/07/2020   CHOLHDL 5.3 12/07/2020   VLDL 11 12/07/2020   LDLCALC 127 (H) 12/07/2020   LDLCALC 87 05/08/2018   See Psychiatric Specialty Exam and Suicide Risk Assessment completed by Attending Physician prior to discharge.  Discharge destination:  Home  Is patient on multiple antipsychotic therapies at discharge:  Yes,   Do you recommend tapering to monotherapy for antipsychotics?  Yes    Has Patient had three or more failed trials of antipsychotic monotherapy by history: Yes (Invega, Olanzapine, Risperdal) Recommended Plan for Multiple Antipsychotic Therapies: Taper to monotherapy as described:   Per the outpatient psychiatric provider when his symptoms improve or stabilize. This is to prevent metabolic syndrome usually associated with use of multiple antipsychotic medications.  Allergies as of 01/04/2021   No Known Allergies      Medication List     STOP taking these medications    divalproex 500 MG DR tablet Commonly known as: DEPAKOTE Replaced by: divalproex 250 MG 24 hr tablet   docusate sodium 100 MG capsule Commonly known as: COLACE   risperiDONE 2 MG disintegrating  tablet Commonly known as: RISPERDAL M-TABS Replaced by: risperiDONE 2 MG tablet       TAKE these medications      Indication  benztropine 0.5 MG tablet Commonly known as: COGENTIN Take 1 tablet (0.5 mg total) by mouth 2 (two) times daily in the am and at bedtime.. For prevention of drug induced tremors What changed:  when to take this additional instructions  Indication: Extrapyramidal Reaction caused by Medications   divalproex 250 MG 24 hr tablet Commonly known as: DEPAKOTE ER Take 5 tablets (1,250 mg total) by mouth at bedtime. For mood stabilization Replaces: divalproex 500 MG DR tablet  Indication: Mood stabilization   hydrOXYzine 25 MG tablet Commonly known as: ATARAX/VISTARIL Take 1 tablet (25 mg total) by mouth every 6 (six) hours as needed for anxiety (Sleep). What changed:  medication strength how much to take when to take this  Indication: Feeling Anxious   levothyroxine 50 MCG tablet Commonly known as: SYNTHROID Take 1 tablet (50 mcg total) by mouth daily at 6 (six) AM. For thyroid hormone replacement Start taking on: January 05, 2021  Indication: Underactive Thyroid   paliperidone 156 MG/ML Susy injection Commonly known as: INVEGA SUSTENNA Inject 1 mL (156 mg  total) into the muscle every 28 (twenty-eight) days. (Due 01-30-21): For mood control Start taking on: January 30, 2021  Indication: Mood control   polyethylene glycol 17 g packet Commonly known as: MIRALAX / GLYCOLAX Take 17 g by mouth daily.  Indication: Constipation   ramelteon 8 MG tablet Commonly known as: ROZEREM Take 1 tablet (8 mg total) by mouth at bedtime. For sleep What changed: additional instructions  Indication: Trouble Sleeping   risperiDONE 2 MG tablet Commonly known as: RISPERDAL Take 1 tablet (2 mg total) by mouth at bedtime. For mood control Replaces: risperiDONE 2 MG disintegrating tablet  Indication: Mood control   senna 8.6 MG Tabs tablet Commonly known as: SENOKOT Take 1 tablet (8.6 mg total) by mouth daily. (May buy from over the counter): For constipation Start taking on: January 05, 2021  Indication: Constipation        Follow-up Information     [facility]. Go to.   Specialty: Behavioral Health Why: Please go to this provider for therapy and medication management services during walk in hours for new patients:  M - W, 8 am to 11 am for therapy and M - TH, 8 am to 11 am for medication management.   Contact information: [address] [place] [address] [phone]        [facility] Follow up.   Why: Please contact this agency to schedule an appointment with your Cardiologist. Contact information: [address] [place] [address] [phone]        [facility]. Call.   Why: Please call this provider to schedule an appointment for primary care services. Contact information: [address] [place] [address] [phone]        [facility]. Call.   Why: Please call to secure transportation for medical services and help with food and other services. Contact  information: Address: [address] [place], [place] [address]  Phone: [phone]        [facility]. Call.   Why: Please call to secure transportation services through this provider. Contact information: Address: [address] [place] [address]  Phone: [phone]  [facility] [employer] [facility]. Call.   Why: Drue Novel is your APS worker.  Please contact her to discuss your case. Contact information: Address: [address], [place], [place] [address]  Phone: [phone] and [phone].               Follow-up recommendations: Activity:  As tolerated Diet: As recommended by your primary care doctor. Keep all scheduled follow-up appointments as recommended.   Comments: Prescriptions given at discharge.  Patient agreeable to plan.  Given opportunity to ask questions.  Appears to feel comfortable with discharge denies any current suicidal or homicidal thought. Patient is also instructed prior to discharge to: Take all medications as prescribed by his/her mental healthcare provider. Report any adverse effects and or reactions from the medicines to his/her outpatient provider promptly. Patient has been instructed & cautioned: To not engage in alcohol and or illegal drug use while on prescription medicines. In the event of worsening symptoms, patient is instructed to call the crisis hotline, 911 and or go to the nearest ED for appropriate evaluation and treatment of symptoms. To follow-up with his/her primary care provider for your other medical issues, concerns and or health care needs.    Signed: Armandina Stammer, NP,pmhnp, fnp-bc 01/04/2021, 11:21 AM

## 2021-01-31 ENCOUNTER — Ambulatory Visit (HOSPITAL_COMMUNITY)
Admission: EM | Admit: 2021-01-31 | Discharge: 2021-02-01 | Disposition: A | Discharge status: Home / Self Care | Payer: PPO | Attending: Family | Admitting: Family

## 2021-01-31 ENCOUNTER — Ambulatory Visit (HOSPITAL_COMMUNITY)
Admission: AD | Admit: 2021-01-31 | Discharge: 2021-01-31 | Disposition: A | Discharge status: Home / Self Care | Payer: PPO | Source: Home / Self Care | Attending: Psychiatry | Admitting: Psychiatry

## 2021-01-31 ENCOUNTER — Other Ambulatory Visit: Payer: Self-pay

## 2021-01-31 DIAGNOSIS — Z20822 Contact with and (suspected) exposure to covid-19: Secondary | ICD-10-CM | POA: Diagnosis not present

## 2021-01-31 DIAGNOSIS — F251 Schizoaffective disorder, depressive type: Secondary | ICD-10-CM | POA: Diagnosis not present

## 2021-01-31 DIAGNOSIS — F32A Depression, unspecified: Secondary | ICD-10-CM | POA: Insufficient documentation

## 2021-01-31 DIAGNOSIS — Z9114 Patient's other noncompliance with medication regimen: Secondary | ICD-10-CM | POA: Insufficient documentation

## 2021-01-31 DIAGNOSIS — F250 Schizoaffective disorder, bipolar type: Secondary | ICD-10-CM | POA: Insufficient documentation

## 2021-01-31 DIAGNOSIS — Z79899 Other long term (current) drug therapy: Secondary | ICD-10-CM | POA: Insufficient documentation

## 2021-01-31 LAB — RESP PANEL BY RT-PCR (FLU A&B, COVID) ARPGX2
Influenza A by PCR: NEGATIVE
Influenza B by PCR: NEGATIVE
SARS Coronavirus 2 by RT PCR: NEGATIVE

## 2021-01-31 LAB — POC SARS CORONAVIRUS 2 AG -  ED: SARS Coronavirus 2 Ag: NEGATIVE

## 2021-01-31 MED ORDER — ACETAMINOPHEN 325 MG PO TABS: 650.0000 mg | ORAL_TABLET | Freq: Four times a day (QID) | ORAL | Status: DC | PRN

## 2021-01-31 MED ORDER — LEVOTHYROXINE SODIUM 25 MCG PO TABS
50.0000 ug | ORAL_TABLET | Freq: Every day | ORAL | Status: DC
  Administered 2021-02-01: 50 ug via ORAL
  Filled 2021-01-31: qty 2

## 2021-01-31 MED ORDER — POLYETHYLENE GLYCOL 3350 17 G PO PACK
17.0000 g | PACK | Freq: Every day | ORAL | Status: DC
  Administered 2021-01-31 – 2021-02-01 (×2): 17 g via ORAL
  Filled 2021-01-31 (×2): qty 1

## 2021-01-31 MED ORDER — RAMELTEON 8 MG PO TABS: 8.0000 mg | ORAL_TABLET | Freq: Every day | ORAL | Status: DC

## 2021-01-31 MED ORDER — ALUM & MAG HYDROXIDE-SIMETH 200-200-20 MG/5ML PO SUSP: 30.0000 mL | ORAL | Status: DC | PRN

## 2021-01-31 MED ORDER — RISPERIDONE 2 MG PO TABS: 2.0000 mg | ORAL_TABLET | Freq: Every day | ORAL | Status: DC

## 2021-01-31 MED ORDER — HYDROXYZINE HCL 25 MG PO TABS: 25.0000 mg | ORAL_TABLET | Freq: Three times a day (TID) | ORAL | Status: DC | PRN

## 2021-01-31 MED ORDER — MAGNESIUM HYDROXIDE 400 MG/5ML PO SUSP: 30.0000 mL | Freq: Every day | ORAL | Status: DC | PRN

## 2021-01-31 MED ORDER — BENZTROPINE MESYLATE 0.5 MG PO TABS
0.5000 mg | ORAL_TABLET | ORAL | Status: DC
  Administered 2021-02-01: 0.5 mg via ORAL
  Filled 2021-01-31: qty 1

## 2021-01-31 MED ORDER — PALIPERIDONE PALMITATE ER 156 MG/ML IM SUSY
156.0000 mg | PREFILLED_SYRINGE | INTRAMUSCULAR | Status: DC
  Administered 2021-01-31: 156 mg via INTRAMUSCULAR

## 2021-01-31 MED ORDER — TRAZODONE HCL 50 MG PO TABS: 50.0000 mg | ORAL_TABLET | Freq: Every evening | ORAL | Status: DC | PRN

## 2021-01-31 MED ORDER — DIVALPROEX SODIUM ER 500 MG PO TB24: 1250.0000 mg | ORAL_TABLET | Freq: Every day | ORAL | Status: DC

## 2021-01-31 MED ORDER — SENNA 8.6 MG PO TABS
1.0000 | ORAL_TABLET | Freq: Every day | ORAL | Status: DC
Start: 2021-01-31 — End: 2021-02-01
  Administered 2021-02-01: 8.6 mg via ORAL
  Filled 2021-01-31 (×2): qty 1
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
## 2021-01-31 NOTE — ED Notes (Signed)
Pt sleeping@this time. Breathing even and unlabored. Will continue to monitor for safety 

## 2021-01-31 NOTE — ED Notes (Signed)
Pt alert and oriented during [facility] admission. Pt denies SI/HI, A/VH and he is calm and cooperative. Pt is cooperative. Education, support, reassurance, and encouragement provided. Pt's belongings in locker. Pt denies any concerns at this time, and verbally contracts for safety. Pt ambulating on the unit with no issues. Pt remains safe on the unit.

## 2021-01-31 NOTE — ED Notes (Signed)
Pt tried contacting family for his ankle monitor charger cord no one answered. Ankle monitor is beeping

## 2021-01-31 NOTE — Progress Notes (Signed)
Medication given. Invega injection given in left deltoid. Patient refused Senakot tablet.

## 2021-01-31 NOTE — Progress Notes (Signed)
Received a call from Lillia Carmel, [facility] lab informing this writer that the lab drawn (lavender tube) on pt was clotted. Lab tech was informed that nursing staff will attempt to draw labs after pt drink some fluids. Nursing staff will continue to monitor.

## 2021-01-31 NOTE — ED Provider Notes (Signed)
Behavioral Health Admission [facility]&P [facility] & OBS)  Date: 01/31/21 Patient Name: Anthony Knox MRN: [id number] Chief Complaint: No chief complaint on file.     Diagnoses:  Final diagnoses:  Schizoaffective disorder, depressive type (HCC)    HPI:  Anthony Knox accepted to [facility] behavioral health for overnight observation after walk-in assessment completed at [facility] behavioral health. He  is assessed face-to-face by nurse practitioner.  He is seated in assessment area, no acute distress.   He is alert and oriented to self and situation. He believes the current city is [place], [place].  He states the current president is [place], patient is reoriented by this provider.  He is pleasant and cooperative during assessment. He reports depressed mood with congruent affect.  He denies suicidal and homicidal ideations.  Denies history of self-harm.  He contracts verbally for safety with this Clinical research associate.  He has normal speech and behavior.  He endorses both auditory and visual hallucinations.  He states "I see images, like things staring at me, mean looking eyes and heads, like a monster or something."  He endorses auditory hallucinations, states "I hear the wind and people walking around in my yard."  Denies command hallucinations.  Patient is able to converse coherently with goal-directed thoughts and no distractibility or preoccupation.  He endorses  paranoia, states he felt he heard "something on my patio last night, but I was afraid to look."  Has been diagnosed with schizoaffective disorder.  He reports he has not taken his medications since discharge from the hospital as they "made me agitated and sleepy."  He denies any current outpatient psychiatry follow-up.  He reports "I have had a bunch of people over the years, but they were not effective and there was a conflict of interest."  No explanation as to nature of outpatient providers "conflict of interest."  He presents with tangential conversation states  "I am trying to reach retrace history, and do maps, I want to be a geologist."  Continues with tangential conversation states "things are not out of place, I think it is just the wrong spelling."  Anthony Knox lives alone in [place].  He denies access to weapons.  He reports feeling hungry states he has not eaten today at all, ate very little yesterday.  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  He feels "when I put food in the refrigerator, it does not keep, it gets too cold."  He reports he would like something to eat.  Anthony Knox reports average appetite and decreased sleep.  He denies alcohol and substance use.  He reports history of alcohol use disorder, states "I used to love beer, I love the taste but I was not addicted."  He reports last use of alcohol 4 months ago.  Anthony Knox is noted to be wearing an ankle monitor.  He reports this is required by his parole officer, [name] phone number [phone], he meets with Ms. Turner 2 times per week when she visits his home.  Patient provided support and encouragement.  Discussed admission to overnight observation area and initiation of medications, patient agrees with treatment plan.   PHQ 2-9:  Flowsheet Row OP [facility] from [facility]  Thoughts that you would be better off dead, or of hurting yourself in some way Nearly every day  PHQ-9 Total Score 27       Flowsheet Row Admission (Discharged) [facility] ED to Hosp-Admission (Discharged) from 12/06/2020 in [facility] PROGRESSIVE CARE Admission (Discharged) from 06/18/2018 [facility]  C-SSRS RISK CATEGORY Error: Q3, 4, or 5 should not be populated when Q2 is No High Risk No Risk        Total Time spent with patient: 20 minutes  Musculoskeletal  Strength & Muscle Tone: within normal limits Gait & Station: normal Patient leans: N/A  Psychiatric  Specialty Exam  Presentation General Appearance: Appropriate for Environment; Casual  Eye Contact:Good  Speech:Clear and Coherent; Normal Rate  Speech Volume:Normal  Handedness:Right   Mood and Affect  Mood:Depressed  Affect:Depressed   Thought Process  Thought Processes:Goal Directed; Coherent  Descriptions of Associations:Intact  Orientation:Partial  Thought Content:Tangential  Diagnosis of Schizophrenia or Schizoaffective disorder in past: Yes  Duration of Psychotic Symptoms: Less than six months  Hallucinations:Hallucinations: Visual; Auditory Description of Auditory Hallucinations: "I hear the wind and people walking in my yard" Description of Visual Hallucinations: "I see images like things staring at me, mean looking eyes in his, like a monster or something."  Ideas of Reference:None  Suicidal Thoughts:Suicidal Thoughts: No  Homicidal Thoughts:Homicidal Thoughts: No   Sensorium  Memory:Immediate Fair; Recent Poor; Remote Poor  Judgment:Fair  Insight:Lacking   Executive Functions  Concentration:Fair  Attention Span:Fair  Recall:Fair  Fund of Knowledge:Good  Language:Good   Psychomotor Activity  Psychomotor Activity:Psychomotor Activity: Normal   Assets  Assets:Communication Skills; Desire for Improvement; Financial Resources/Insurance; Housing; Intimacy; Physical Health; Social Support; Resilience   Sleep  Sleep:Sleep: Poor Number of Hours of Sleep: 6   Nutritional Assessment (For OBS and FBC admissions only) Has the patient had a weight loss or gain of 10 pounds or more in the last 3 months?: No Has the patient had a decrease in food intake/or appetite?: Yes Does the patient have dental problems?: No Does the patient have eating habits or behaviors that may be indicators of an eating disorder including binging or inducing vomiting?: No Has the patient recently lost weight without trying?: No Has the patient been eating poorly  because of a decreased appetite?: No Malnutrition Screening Tool Score: 0   Physical Exam Vitals and nursing note reviewed.  Constitutional:      Appearance: Normal appearance. He is well-developed and normal weight.  HENT:     Head: Normocephalic and atraumatic.     Nose: Nose normal.  Cardiovascular:     Rate and Rhythm: Normal rate.  Pulmonary:     Effort: Pulmonary effort is normal.  Musculoskeletal:        General: Normal range of motion.  Neurological:     Mental Status: He is alert. Mental status is at baseline.  Psychiatric:        Attention and Perception: Attention normal. He perceives auditory hallucinations.        Mood and Affect: Mood and affect normal.        Speech: Speech is tangential.        Behavior: Behavior normal. Behavior is cooperative.        Thought Content: Thought content is paranoid.        Cognition and Memory: Cognition normal. He exhibits impaired recent memory.   Review of Systems  Constitutional: Negative.   HENT: Negative.    Eyes: Negative.   Respiratory: Negative.    Cardiovascular: Negative.   Gastrointestinal: Negative.   Genitourinary: Negative.   Musculoskeletal: Negative.   Skin: Negative.   Neurological: Negative.   Endo/Heme/Allergies: Negative.   Psychiatric/Behavioral:  Positive for depression and  hallucinations. The patient has insomnia.    There were no vitals taken for this visit. There is no height or weight on file to calculate BMI.  Past Psychiatric History: Schizoaffective disorder, major depressive disorder  Is the patient at risk to self? No  Has the patient been a risk to self in the past 6 months? No .    Has the patient been a risk to self within the distant past? No   Is the patient a risk to others? No   Has the patient been a risk to others in the past 6 months? No   Has the patient been a risk to others within the distant past? No   Past Medical History:  Past Medical History:  Diagnosis Date    Schizophrenia (HCC)    No past surgical history on file.  Family History: No family history on file.  Social History:  Social History   Socioeconomic History   Marital status: Single    Spouse name: Not on file   Number of children: Not on file   Years of education: Not on file   Highest education level: Not on file  Occupational History   Not on file  Tobacco Use   Smoking status: Never   Smokeless tobacco: Never  Vaping Use   Vaping Use: Never used  Substance and Sexual Activity   Alcohol use: Not Currently   Drug use: Never   Sexual activity: Not Currently  Other Topics Concern   Not on file  Social History Narrative   Not on file   Social Determinants of Health   Financial Resource Strain: Not on file  Food Insecurity: Not on file  Transportation Needs: Not on file  Physical Activity: Not on file  Stress: Not on file  Social Connections: Not on file  Intimate Partner Violence: Not on file    SDOH:  SDOH Screenings   Alcohol Screen: Low Risk    Last Alcohol Screening Score (AUDIT): 0  Depression (PHQ2-9): Medium Risk   PHQ-2 Score: 27  Financial Resource Strain: Not on file  Food Insecurity: Not on file  Housing: Not on file  Physical Activity: Not on file  Social Connections: Not on file  Stress: Not on file  Tobacco Use: Low Risk    Smoking Tobacco Use: Never   Smokeless Tobacco Use: Never  Transportation Needs: Not on file    Last Labs:  Admission on 12/13/2020, Discharged on 01/04/2021  Component Date Value Ref Range Status   Valproic Acid Lvl 12/14/2020 89  50.0 - 100.0 ug/mL Final   Performed at [facility], [address], [place], [place] [address]   Valproic Acid Lvl 12/20/2020 69  50.0 - 100.0 ug/mL Final   Performed at [facility], [address], [place], [place] [address]   WBC 12/20/2020 5.7  4.0 - 10.5 K/uL Final   RBC 12/20/2020 4.66  4.22 - 5.81 MIL/uL Final   Hemoglobin 12/20/2020 13.9   13.0 - 17.0 g/dL Final   HCT 82/95/6213 42.8  39.0 - 52.0 % Final   MCV 12/20/2020 91.8  80.0 - 100.0 fL Final   MCH 12/20/2020 29.8  26.0 - 34.0 pg Final   MCHC 12/20/2020 32.5  30.0 - 36.0 g/dL Final   RDW 08/65/7846 14.8  11.5 - 15.5 % Final   Platelets 12/20/2020 191  150 - 400 K/uL Final   nRBC 12/20/2020 0.0  0.0 - 0.2 % Final   Neutrophils Relative %  12/20/2020 53  % Final   Neutro Abs 12/20/2020 3.0  1.7 - 7.7 K/uL Final   Lymphocytes Relative 12/20/2020 35  % Final   Lymphs Abs 12/20/2020 2.0  0.7 - 4.0 K/uL Final   Monocytes Relative 12/20/2020 9  % Final   Monocytes Absolute 12/20/2020 0.5  0.1 - 1.0 K/uL Final   Eosinophils Relative 12/20/2020 1  % Final   Eosinophils Absolute 12/20/2020 0.1  0.0 - 0.5 K/uL Final   Basophils Relative 12/20/2020 1  % Final   Basophils Absolute 12/20/2020 0.0  0.0 - 0.1 K/uL Final   Immature Granulocytes 12/20/2020 1  % Final   Abs Immature Granulocytes 12/20/2020 0.06  0.00 - 0.07 K/uL Final   Performed at [facility], [address], [place], [place] [address]   Total Protein 12/20/2020 6.0 (A) 6.5 - 8.1 g/dL Final   Albumin 60/45/4098 3.4 (A) 3.5 - 5.0 g/dL Final   AST 11/91/4782 16  15 - 41 U/L Final   ALT 12/20/2020 8  0 - 44 U/L Final   Alkaline Phosphatase 12/20/2020 54  38 - 126 U/L Final   Total Bilirubin 12/20/2020 0.7  0.3 - 1.2 mg/dL Final   Bilirubin, Direct 12/20/2020 0.2  0.0 - 0.2 mg/dL Final   Indirect Bilirubin 12/20/2020 0.5  0.3 - 0.9 mg/dL Final   Performed at [facility], [address] [place], [place] [address]   TSH 12/29/2020 15.009 (A) 0.350 - 4.500 uIU/mL Final   Comment: Performed by a 3rd Generation assay with a functional sensitivity of <=0.01 uIU/mL. Performed at [facility], [address], [place], [place] [address]   Admission on 12/06/2020, Discharged on 12/13/2020  Component Date Value Ref Range Status   Sodium 12/06/2020 139  135 - 145  mmol/L Final   Potassium 12/06/2020 3.3 (A) 3.5 - 5.1 mmol/L Final   Chloride 12/06/2020 109  98 - 111 mmol/L Final   CO2 12/06/2020 22  22 - 32 mmol/L Final   Glucose, Bld 12/06/2020 150 (A) 70 - 99 mg/dL Final   Glucose reference range applies only to samples taken after fasting for at least 8 hours.   BUN 12/06/2020 12  6 - 20 mg/dL Final   Creatinine, Ser 12/06/2020 1.06  0.61 - 1.24 mg/dL Final   Calcium 78/46/9629 8.1 (A) 8.9 - 10.3 mg/dL Final   GFR, Estimated 12/06/2020 >60  >60 mL/min Final   Comment: (NOTE) Calculated using the CKD-EPI Creatinine Equation (2021)    Anion gap 12/06/2020 8  5 - 15 Final   Performed at [facility], [address], [place], [place] [address]   Magnesium 12/06/2020 1.9  1.7 - 2.4 mg/dL Final   Performed at [facility], [address], [place], [place] [address]   WBC 12/06/2020 6.6  4.0 - 10.5 K/uL Final   RBC 12/06/2020 5.02  4.22 - 5.81 MIL/uL Final   Hemoglobin 12/06/2020 15.0  13.0 - 17.0 g/dL Final   HCT 05/10/2535 45.1  39.0 - 52.0 % Final   MCV 12/06/2020 89.8  80.0 - 100.0 fL Final   MCH 12/06/2020 29.9  26.0 - 34.0 pg Final   MCHC 12/06/2020 33.3  30.0 - 36.0 g/dL Final   RDW 64/40/3474 14.1  11.5 - 15.5 % Final   Platelets 12/06/2020 155  150 - 400 K/uL Final   nRBC 12/06/2020 0.0  0.0 - 0.2 % Final   Performed at [facility], [address], [place], [place] [address]   TSH 12/06/2020 9.441 (A) 0.350 - 4.500 uIU/mL Final   Comment: Performed by a 3rd Generation assay with a functional sensitivity of <=0.01 uIU/mL. Performed at [facility], [address] [place], [place] [address]    Prothrombin Time 12/06/2020 13.6  11.4 - 15.2 seconds Final   INR 12/06/2020 1.0  0.8 - 1.2 Final   Comment: (NOTE) INR goal varies based on device and disease states. Performed at [facility], [address] [place], [place] [address]    SARS Coronavirus 2 by RT PCR 12/06/2020 NEGATIVE  NEGATIVE Final    Comment: (NOTE) SARS-CoV-2 target nucleic acids are NOT DETECTED.  The SARS-CoV-2 RNA is generally detectable in upper respiratory specimens during the acute phase of infection. The lowest concentration of SARS-CoV-2 viral copies this assay can detect is 138 copies/mL. A negative result does not preclude SARS-Cov-2 infection and should not be used as the sole basis for treatment or other patient management decisions. A negative result may occur with  improper specimen collection/handling, submission of specimen other than nasopharyngeal swab, presence of viral mutation(s) within the areas targeted by this assay, and inadequate number of viral copies(<138 copies/mL). A negative result must be combined with clinical observations, patient history, and epidemiological information. The expected result is Negative.  Fact Sheet for Patients:  BloggerCourse.com  Fact Sheet for Healthcare Providers:  SeriousBroker.it  This test is no                          t yet approved or cleared by the [place] FDA and  has been authorized for detection and/or diagnosis of SARS-CoV-2 by FDA under an Emergency Use Authorization (EUA). This EUA will remain  in effect (meaning this test can be used) for the duration of the COVID-19 declaration under Section 564(b)(1) of the Act, 21 U.S.C.section 360bbb-3(b)(1), unless the authorization is terminated  or revoked sooner.       Influenza A by PCR 12/06/2020 NEGATIVE  NEGATIVE Final   Influenza B by PCR 12/06/2020 NEGATIVE  NEGATIVE Final   Comment: (NOTE) The Xpert Xpress SARS-CoV-2/FLU/RSV plus assay is intended as an aid in the diagnosis of influenza from Nasopharyngeal swab specimens and should not be used as a sole basis for treatment. Nasal washings and aspirates are unacceptable for Xpert Xpress SARS-CoV-2/FLU/RSV testing.  Fact Sheet for  Patients: BloggerCourse.com  Fact Sheet for Healthcare Providers: SeriousBroker.it  This test is not yet approved or cleared by the [place] FDA and has been authorized for detection and/or diagnosis of SARS-CoV-2 by FDA under an Emergency Use Authorization (EUA). This EUA will remain in effect (meaning this test can be used) for the duration of the COVID-19 declaration under Section 564(b)(1) of the Act, 21 U.S.C. section 360bbb-3(b)(1), unless the authorization is terminated or revoked.  Performed at [facility], [address] [place], [place] [address]    Color, Urine 12/06/2020 STRAW (A) YELLOW Final   APPearance 12/06/2020 CLEAR  CLEAR Final   Specific Gravity, Urine 12/06/2020 1.005  1.005 - 1.030 Final   pH 12/06/2020 6.0  5.0 - 8.0 Final   Glucose, UA 12/06/2020 NEGATIVE  NEGATIVE mg/dL Final   Hgb urine dipstick 12/06/2020 NEGATIVE  NEGATIVE Final   Bilirubin Urine 12/06/2020 NEGATIVE  NEGATIVE Final   Ketones, ur 12/06/2020 5 (A) NEGATIVE mg/dL Final   Protein, ur 65/78/4696 NEGATIVE  NEGATIVE mg/dL  Final   Nitrite 12/06/2020 NEGATIVE  NEGATIVE Final   Leukocytes,Ua 12/06/2020 NEGATIVE  NEGATIVE Final   Performed at [facility], [address], [place], [place] [address]   Opiates 12/06/2020 NONE DETECTED  NONE DETECTED Final   Cocaine 12/06/2020 NONE DETECTED  NONE DETECTED Final   Benzodiazepines 12/06/2020 NONE DETECTED  NONE DETECTED Final   Amphetamines 12/06/2020 NONE DETECTED  NONE DETECTED Final   Tetrahydrocannabinol 12/06/2020 NONE DETECTED  NONE DETECTED Final   Barbiturates 12/06/2020 NONE DETECTED  NONE DETECTED Final   Comment: (NOTE) DRUG SCREEN FOR MEDICAL PURPOSES ONLY.  IF CONFIRMATION IS NEEDED FOR ANY PURPOSE, NOTIFY LAB WITHIN 5 DAYS.  LOWEST DETECTABLE LIMITS FOR URINE DRUG SCREEN Drug Class                     Cutoff (ng/mL) Amphetamine and metabolites     1000 Barbiturate and metabolites    200 Benzodiazepine                 200 Tricyclics and metabolites     300 Opiates and metabolites        300 Cocaine and metabolites        300 THC                            50 Performed at [facility], [address] [place], [place] [address]    Acetaminophen (Tylenol), Serum 12/06/2020 <10 (A) 10 - 30 ug/mL Final   Comment: (NOTE) Therapeutic concentrations vary significantly. A range of 10-30 ug/mL  may be an effective concentration for many patients. However, some  are best treated at concentrations outside of this range. Acetaminophen concentrations >150 ug/mL at 4 hours after ingestion  and >50 ug/mL at 12 hours after ingestion are often associated with  toxic reactions.  Performed at [facility], [address] [place], [place] [address]    Salicylate Lvl 12/06/2020 <7.0 (A) 7.0 - 30.0 mg/dL Final   Performed at [facility], [address], [place], [place] [address]   Troponin I (High Sensitivity) 12/06/2020 3  <18 ng/L Final   Comment: (NOTE) Elevated high sensitivity troponin I (hsTnI) values and significant  changes across serial measurements may suggest ACS but many other  chronic and acute conditions are known to elevate hsTnI results.  Refer to the "Links" section for chest pain algorithms and additional  guidance. Performed at [facility], [address], [place], [place] [address]    Carbamazepine Lvl 12/06/2020 <2.0 (A) 4.0 - 12.0 ug/mL Final   Performed at [facility], [address] [place], [place] [address]   Troponin I (High Sensitivity) 12/06/2020 3  <18 ng/L Final   Comment: (NOTE) Elevated high sensitivity troponin I (hsTnI) values and significant  changes across serial measurements may suggest ACS but many other  chronic and acute conditions are known to elevate hsTnI results.  Refer to the "Links" section for chest pain algorithms and additional  guidance. Performed at  [facility], [address] [place], [place] [address]    Heparin Unfractionated 12/07/2020 0.45  0.30 - 0.70 IU/mL Final   Comment: (NOTE) The clinical reportable range upper limit is being lowered to >1.10 to align with the FDA approved guidance for the current laboratory assay.  If heparin results are below expected values, and patient dosage has  been confirmed, suggest  follow up testing of antithrombin III levels. Performed at [facility], [address], [place], [place] [address]    WBC 12/07/2020 8.1  4.0 - 10.5 K/uL Final   RBC 12/07/2020 5.07  4.22 - 5.81 MIL/uL Final   Hemoglobin 12/07/2020 15.1  13.0 - 17.0 g/dL Final   HCT 19/14/7829 45.3  39.0 - 52.0 % Final   MCV 12/07/2020 89.3  80.0 - 100.0 fL Final   MCH 12/07/2020 29.8  26.0 - 34.0 pg Final   MCHC 12/07/2020 33.3  30.0 - 36.0 g/dL Final   RDW 56/21/3086 14.3  11.5 - 15.5 % Final   Platelets 12/07/2020 150  150 - 400 K/uL Final   nRBC 12/07/2020 0.0  0.0 - 0.2 % Final   Performed at [facility], [address], [place], [place] [address]   Weight 12/08/2020 9,629.52  oz Final   Height 12/08/2020 67  in Final   BP 12/08/2020 104/71  mmHg Final   S' Lateral 12/08/2020 2.80  cm Final   AR max vel 12/08/2020 4.15  cm2 Final   AV Area VTI 12/08/2020 4.34  cm2 Final   AV Mean grad 12/08/2020 1.0  mmHg Final   AV Peak grad 12/08/2020 2.4  mmHg Final   Ao pk vel 12/08/2020 0.77  m/s Final   Area-P 1/2 12/08/2020 1.94  cm2 Final   AV Area mean vel 12/08/2020 4.54  cm2 Final   HIV Screen 4th Generation wRfx 12/06/2020 Non Reactive  Non Reactive Final   Performed at [facility], [address], [place], [place] [address]   Cholesterol 12/07/2020 170  0 - 200 mg/dL Final   Triglycerides 44/07/270 56  <150 mg/dL Final   HDL 53/66/4403 32 (A) >40 mg/dL Final   Total CHOL/HDL Ratio 12/07/2020 5.3  RATIO Final   VLDL 12/07/2020 11  0 - 40 mg/dL Final   LDL Cholesterol 12/07/2020 127 (A) 0 -  99 mg/dL Final   Comment:        Total Cholesterol/HDL:CHD Risk Coronary Heart Disease Risk Table                     Men   Women  1/2 Average Risk   3.4   3.3  Average Risk       5.0   4.4  2 X Average Risk   9.6   7.1  3 X Average Risk  23.4   11.0        Use the calculated Patient Ratio above and the CHD Risk Table to determine the patient's CHD Risk.        ATP III CLASSIFICATION (LDL):  <100     mg/dL   Optimal  474-259  mg/dL   Near or Above                    Optimal  130-159  mg/dL   Borderline  563-875  mg/dL   High  >643     mg/dL   Very High Performed at [facility], [address], [place], [place] [address]    Hgb A1c MFr Bld 12/07/2020 5.9 (A) 4.8 - 5.6 % Final   Comment: (NOTE)         Prediabetes: 5.7 - 6.4         Diabetes: >6.4         Glycemic control for adults with diabetes: <7.0    Mean Plasma  Glucose 12/07/2020 123  mg/dL Final   Comment: (NOTE) Performed At: [facility] [address] [place], [place] [id number] Jolene Schimke MD WU:[id number]    T3, Free 12/07/2020 2.7  2.0 - 4.4 pg/mL Final   Comment: (NOTE) Performed At: [facility] [address] [place], [place] [id number] Jolene Schimke MD VH:[id number]    Free T4 12/07/2020 0.99  0.61 - 1.12 ng/dL Final   Comment: (NOTE) Biotin ingestion may interfere with free T4 tests. If the results are inconsistent with the TSH level, previous test results, or the clinical presentation, then consider biotin interference. If needed, order repeat testing after stopping biotin. Performed at [facility], [address] [place], [place] [address]    Sodium 12/07/2020 143  135 - 145 mmol/L Final   Potassium 12/07/2020 3.9  3.5 - 5.1 mmol/L Final   Chloride 12/07/2020 114 (A) 98 - 111 mmol/L Final   CO2 12/07/2020 21 (A) 22 - 32 mmol/L Final   Glucose, Bld 12/07/2020 109 (A) 70 - 99 mg/dL Final   Glucose reference range applies only to samples taken after fasting for  at least 8 hours.   BUN 12/07/2020 8  6 - 20 mg/dL Final   Creatinine, Ser 12/07/2020 0.87  0.61 - 1.24 mg/dL Final   Calcium 40/04/2724 8.5 (A) 8.9 - 10.3 mg/dL Final   Total Protein 36/64/4034 5.7 (A) 6.5 - 8.1 g/dL Final   Albumin 74/25/9563 3.3 (A) 3.5 - 5.0 g/dL Final   AST 87/56/4332 13 (A) 15 - 41 U/L Final   ALT 12/07/2020 12  0 - 44 U/L Final   Alkaline Phosphatase 12/07/2020 44  38 - 126 U/L Final   Total Bilirubin 12/07/2020 0.6  0.3 - 1.2 mg/dL Final   GFR, Estimated 12/07/2020 >60  >60 mL/min Final   Comment: (NOTE) Calculated using the CKD-EPI Creatinine Equation (2021)    Anion gap 12/07/2020 8  5 - 15 Final   Performed at [facility], [address], [place], [place] [address]   Lactic Acid, Venous 12/06/2020 1.2  0.5 - 1.9 mmol/L Final   Performed at [facility], [address] [place], [place] [address]   Lactic Acid, Venous 12/07/2020 1.1  0.5 - 1.9 mmol/L Final   Performed at [facility], [address] [place], [place] [address]   pH, Ven 12/06/2020 7.374  7.250 - 7.430 Final   pCO2, Ven 12/06/2020 35.7 (A) 44.0 - 60.0 mmHg Final   pO2, Ven 12/06/2020 53.0 (A) 32.0 - 45.0 mmHg Final   Bicarbonate 12/06/2020 20.8  20.0 - 28.0 mmol/L Final   TCO2 12/06/2020 22  22 - 32 mmol/L Final   O2 Saturation 12/06/2020 86.0  % Final   Acid-base deficit 12/06/2020 4.0 (A) 0.0 - 2.0 mmol/L Final   Sodium 12/06/2020 146 (A) 135 - 145 mmol/L Final   Potassium 12/06/2020 4.1  3.5 - 5.1 mmol/L Final   Calcium, Ion 12/06/2020 1.11 (A) 1.15 - 1.40 mmol/L Final   HCT 12/06/2020 43.0  39.0 - 52.0 % Final   Hemoglobin 12/06/2020 14.6  13.0 - 17.0 g/dL Final   Sample type 07/22/3233 VENOUS   Final   MRSA by PCR 12/07/2020 NEGATIVE  NEGATIVE Final   Comment:        The GeneXpert MRSA Assay (FDA approved for NASAL specimens only), is one component of a comprehensive MRSA colonization surveillance program. It is not intended to diagnose MRSA infection nor to  guide  or monitor treatment for MRSA infections. Performed at [facility], [address], [place], [place] [address]    Heparin Unfractionated 12/07/2020 0.33  0.30 - 0.70 IU/mL Final   Comment: (NOTE) The clinical reportable range upper limit is being lowered to >1.10 to align with the FDA approved guidance for the current laboratory assay.  If heparin results are below expected values, and patient dosage has  been confirmed, suggest follow up testing of antithrombin III levels. Performed at [facility], [address], [place], [place] [address]    Valproic Acid Lvl 12/07/2020 <10 (A) 50.0 - 100.0 ug/mL Final   Comment: RESULTS CONFIRMED BY MANUAL DILUTION Performed at [facility], [address], [place], [place] [address]    WBC 12/08/2020 7.3  4.0 - 10.5 K/uL Final   RBC 12/08/2020 4.96  4.22 - 5.81 MIL/uL Final   Hemoglobin 12/08/2020 14.8  13.0 - 17.0 g/dL Final   HCT 91/47/8295 44.0  39.0 - 52.0 % Final   MCV 12/08/2020 88.7  80.0 - 100.0 fL Final   MCH 12/08/2020 29.8  26.0 - 34.0 pg Final   MCHC 12/08/2020 33.6  30.0 - 36.0 g/dL Final   RDW 62/13/0865 14.3  11.5 - 15.5 % Final   Platelets 12/08/2020 169  150 - 400 K/uL Final   nRBC 12/08/2020 0.0  0.0 - 0.2 % Final   Performed at [facility], [address] [place], [place] [address]   Amphetamines, Urine 12/09/2020 Negative  Cutoff=1000 ng/mL Final   Amphetamine test includes Amphetamine and Methamphetamine.   Barbiturate, Ur 12/09/2020 Negative  Cutoff=300 ng/mL Final   Benzodiazepine Quant, Ur 12/09/2020 Negative  Cutoff=300 ng/mL Final   Cannabinoid Quant, Ur 12/09/2020 Negative  Cutoff=50 ng/mL Final   Cocaine (Metab.) 12/09/2020 Negative  Cutoff=300 ng/mL Final   Opiate Quant, Ur 12/09/2020 Negative  Cutoff=300 ng/mL Final   Opiate test includes Codeine and Morphine only.   Phencyclidine, Ur 12/09/2020 Negative  Cutoff=25 ng/mL Final   Methadone Screen, Urine 12/09/2020 Negative   Cutoff=300 ng/mL Final   Propoxyphene, Urine 12/09/2020 Negative  Cutoff=300 ng/mL Final   Ethanol U, Cloretta Ned 12/09/2020 Negative  Cutoff=0.020 % Final   Comment: (NOTE) Performed At: [facility] [address] [place], [place] [id number] Avis Epley PhD [id number]    WBC 12/10/2020 6.1  4.0 - 10.5 K/uL Final   RBC 12/10/2020 4.85  4.22 - 5.81 MIL/uL Final   Hemoglobin 12/10/2020 14.4  13.0 - 17.0 g/dL Final   HCT 64/40/3474 42.9  39.0 - 52.0 % Final   MCV 12/10/2020 88.5  80.0 - 100.0 fL Final   MCH 12/10/2020 29.7  26.0 - 34.0 pg Final   MCHC 12/10/2020 33.6  30.0 - 36.0 g/dL Final   RDW 25/95/6387 14.0  11.5 - 15.5 % Final   Platelets 12/10/2020 208  150 - 400 K/uL Final   nRBC 12/10/2020 0.0  0.0 - 0.2 % Final   Performed at [facility], [address], [place], [place] [address]   Valproic Acid Lvl 12/11/2020 54  50.0 - 100.0 ug/mL Final   Performed at [facility], [address], [place], [place] [address]   Sodium 12/11/2020 137  135 - 145 mmol/L Final   Potassium 12/11/2020 3.9  3.5 - 5.1 mmol/L Final   Chloride 12/11/2020 105  98 - 111 mmol/L Final   CO2 12/11/2020 24  22 - 32 mmol/L Final  Glucose, Bld 12/11/2020 100 (A) 70 - 99 mg/dL Final   Glucose reference range applies only to samples taken after fasting for at least 8 hours.   BUN 12/11/2020 9  6 - 20 mg/dL Final   Creatinine, Ser 12/11/2020 0.89  0.61 - 1.24 mg/dL Final   Calcium 14/78/2956 8.6 (A) 8.9 - 10.3 mg/dL Final   Total Protein 21/30/8657 5.6 (A) 6.5 - 8.1 g/dL Final   Albumin 84/69/6295 3.1 (A) 3.5 - 5.0 g/dL Final   AST 28/41/3244 13 (A) 15 - 41 U/L Final   ALT 12/11/2020 12  0 - 44 U/L Final   Alkaline Phosphatase 12/11/2020 53  38 - 126 U/L Final   Total Bilirubin 12/11/2020 0.4  0.3 - 1.2 mg/dL Final   GFR, Estimated 12/11/2020 >60  >60 mL/min Final   Comment: (NOTE) Calculated using the CKD-EPI Creatinine Equation (2021)    Anion gap 12/11/2020 8  5 - 15 Final   Performed at  [facility], [address], [place], [place] [address]   Sodium 12/12/2020 138  135 - 145 mmol/L Final   Potassium 12/12/2020 4.3  3.5 - 5.1 mmol/L Final   Chloride 12/12/2020 103  98 - 111 mmol/L Final   CO2 12/12/2020 26  22 - 32 mmol/L Final   Glucose, Bld 12/12/2020 101 (A) 70 - 99 mg/dL Final   Glucose reference range applies only to samples taken after fasting for at least 8 hours.   BUN 12/12/2020 7  6 - 20 mg/dL Final   Creatinine, Ser 12/12/2020 0.91  0.61 - 1.24 mg/dL Final   Calcium 25/36/6440 9.0  8.9 - 10.3 mg/dL Final   GFR, Estimated 12/12/2020 >60  >60 mL/min Final   Comment: (NOTE) Calculated using the CKD-EPI Creatinine Equation (2021)    Anion gap 12/12/2020 9  5 - 15 Final   Performed at [facility], [address], [place], [place] [address]   Sodium 12/13/2020 136  135 - 145 mmol/L Final   Potassium 12/13/2020 3.9  3.5 - 5.1 mmol/L Final   Chloride 12/13/2020 101  98 - 111 mmol/L Final   CO2 12/13/2020 27  22 - 32 mmol/L Final   Glucose, Bld 12/13/2020 104 (A) 70 - 99 mg/dL Final   Glucose reference range applies only to samples taken after fasting for at least 8 hours.   BUN 12/13/2020 10  6 - 20 mg/dL Final   Creatinine, Ser 12/13/2020 0.99  0.61 - 1.24 mg/dL Final   Calcium 59/56/3875 8.5 (A) 8.9 - 10.3 mg/dL Final   GFR, Estimated 12/13/2020 >60  >60 mL/min Final   Comment: (NOTE) Calculated using the CKD-EPI Creatinine Equation (2021)    Anion gap 12/13/2020 8  5 - 15 Final   Performed at [facility], [address] [place], [place] [address]   WBC 12/13/2020 7.2  4.0 - 10.5 K/uL Final   RBC 12/13/2020 4.83  4.22 - 5.81 MIL/uL Final   Hemoglobin 12/13/2020 14.3  13.0 - 17.0 g/dL Final   HCT 95/18/8416 43.7  39.0 - 52.0 % Final   MCV 12/13/2020 90.5  80.0 - 100.0 fL Final   MCH 12/13/2020 29.6  26.0 - 34.0 pg Final   MCHC 12/13/2020 32.7  30.0 - 36.0 g/dL Final   RDW 60/63/0160 14.0  11.5 - 15.5 % Final   Platelets 12/13/2020 251   150 - 400 K/uL Final   nRBC 12/13/2020 0.0  0.0 - 0.2 % Final  Performed at [facility], [address] [place], [place] [address]   Valproic Acid Lvl 12/13/2020 119 (A) 50.0 - 100.0 ug/mL Final   Performed at [facility], [address], [place], [place] [address]   SARS Coronavirus 2 by RT PCR 12/13/2020 NEGATIVE  NEGATIVE Final   Comment: (NOTE) SARS-CoV-2 target nucleic acids are NOT DETECTED.  The SARS-CoV-2 RNA is generally detectable in upper respiratory specimens during the acute phase of infection. The lowest concentration of SARS-CoV-2 viral copies this assay can detect is 138 copies/mL. A negative result does not preclude SARS-Cov-2 infection and should not be used as the sole basis for treatment or other patient management decisions. A negative result may occur with  improper specimen collection/handling, submission of specimen other than nasopharyngeal swab, presence of viral mutation(s) within the areas targeted by this assay, and inadequate number of viral copies(<138 copies/mL). A negative result must be combined with clinical observations, patient history, and epidemiological information. The expected result is Negative.  Fact Sheet for Patients:  BloggerCourse.com  Fact Sheet for Healthcare Providers:  SeriousBroker.it  This test is no                          t yet approved or cleared by the [place] FDA and  has been authorized for detection and/or diagnosis of SARS-CoV-2 by FDA under an Emergency Use Authorization (EUA). This EUA will remain  in effect (meaning this test can be used) for the duration of the COVID-19 declaration under Section 564(b)(1) of the Act, 21 U.S.C.section 360bbb-3(b)(1), unless the authorization is terminated  or revoked sooner.       Influenza A by PCR 12/13/2020 NEGATIVE  NEGATIVE Final   Influenza B by PCR 12/13/2020 NEGATIVE  NEGATIVE Final   Comment:  (NOTE) The Xpert Xpress SARS-CoV-2/FLU/RSV plus assay is intended as an aid in the diagnosis of influenza from Nasopharyngeal swab specimens and should not be used as a sole basis for treatment. Nasal washings and aspirates are unacceptable for Xpert Xpress SARS-CoV-2/FLU/RSV testing.  Fact Sheet for Patients: BloggerCourse.com  Fact Sheet for Healthcare Providers: SeriousBroker.it  This test is not yet approved or cleared by the [place] FDA and has been authorized for detection and/or diagnosis of SARS-CoV-2 by FDA under an Emergency Use Authorization (EUA). This EUA will remain in effect (meaning this test can be used) for the duration of the COVID-19 declaration under Section 564(b)(1) of the Act, 21 U.S.C. section 360bbb-3(b)(1), unless the authorization is terminated or revoked.  Performed at [facility], [address], [place], [place] [address]     Allergies: Patient has no known allergies.  PTA Medications: (Not in a hospital admission)   Medical Decision Making  Discussed restarting home medications to target current symptoms, patient in agreement with plan.  Patient remains voluntary at this time. Medications: -Benztropine 0.5 mg twice daily -Hydroxyzine 25 mg 3 times daily as needed/anxiety -Levothyroxine 50 mcg daily -Invega sustenna 156 mg IM q. 28 days -MiraLAX 17 g daily -Ramelteon 8 mg nightly -Risperidone 2 mg nightly -Senna 8.6 mg daily  -Depakote ER 1250 mg nightly discontinued, awaiting valproic acid level result.  Patient laboratory studies not completed at this time.  QTC currently measures 447 on 01/31/2021.    Recommendations  Based on my evaluation the patient does not appear to have an emergency medical condition. Patient reviewed with Dr. Bronwen Betters. [facility] and [facility].  He will be  reevaluated on 02/01/2021, disposition will be determined at that time.    Lenard Lanceina L Michela Herst, FNP 01/31/21  12:31 PM

## 2021-01-31 NOTE — Progress Notes (Signed)
Patient is alert and oriented, RN made attempt to draw lab, lavender tube drawn, before blood flow stopped. RN informed patient to drink fluids in order for next lab draw to be successful. Specimen sent to [facility]. denies SI, and AVH. Patient oriented to the unit.  Nursing staff will continue to monitor.

## 2021-01-31 NOTE — H&P (Signed)
Behavioral Health Medical Screening Exam  Anthony Knox is a 60 y.o. male.  Total Time spent with patient: 30 minutes  Anthony Knox is a 60 y.o male, seen face to face by this provider. Presents to [facility] as voluntary walk-in with his brother Anthony Knox. Anthony Knox reports that patient has not taken his medications since discharge from inpatient stay at [facility] (6-08-19-22) . Patient is sitting calmly in exam room. Reports he does not know why he is here. Partial orientation to person and year. Thought processes tangential. Denies suicidal ideation, denies homicidal ideation, denies auditory hallucinations, endorses visual hallucinations: "images and feels like people are watching me". Brother reports patient lives alone, he checks on him most days, brother attempts to get him to take medications, but he does not take it. Brother has court date on Aug 4 to start the process of becoming legal guardian. Brother also trying to get him into a residential/ group home.    Psychiatric Specialty Exam:  Presentation  General Appearance: Appropriate for Environment; Casual  Eye Contact:Good  Speech:Clear and Coherent; Normal Rate  Speech Volume:Normal  Handedness:Right   Mood and Affect  Mood:Depressed  Affect:Depressed   Thought Process  Thought Processes:Goal Directed; Coherent  Descriptions of Associations:Intact  Orientation:Partial  Thought Content:Tangential  History of Schizophrenia/Schizoaffective disorder:Yes  Duration of Psychotic Symptoms:Less than six months  Hallucinations:Hallucinations: Visual; Auditory Description of Auditory Hallucinations: "I hear the wind and people walking in my yard" Description of Visual Hallucinations: "I see images like things staring at me, mean looking eyes in his, like a monster or something." Ideas of Reference:None  Suicidal Thoughts:Suicidal Thoughts: No Homicidal Thoughts:Homicidal Thoughts: No  Sensorium  Memory:Immediate Fair; Recent Poor;  Remote Poor  Judgment:Fair  Insight:Lacking   Executive Functions  Concentration:Fair  Attention Span:Fair  Recall:Fair  Fund of Knowledge:Good  Language:Good   Psychomotor Activity  Psychomotor Activity: Psychomotor Activity: Normal  Assets  Assets:Communication Skills; Desire for Improvement; Financial Resources/Insurance; Housing; Intimacy; Physical Health; Social Support; Resilience   Sleep  Sleep: Sleep: Poor Number of Hours of Sleep: 6   Physical Exam: Physical Exam Cardiovascular:     Rate and Rhythm: Normal rate and regular rhythm.     Comments: History of a-fib, regular upon auscultation.  Pulmonary:     Effort: Pulmonary effort is normal.     Breath sounds: Normal breath sounds.  Skin:    General: Skin is warm and dry.  Neurological:     Mental Status: He is alert. Mental status is at baseline.  Psychiatric:        Attention and Perception: Attention normal.        Mood and Affect: Affect is flat.        Speech: Speech is delayed.        Behavior: Behavior is slowed. Behavior is not agitated or aggressive. Behavior is cooperative.        Thought Content: Thought content is not paranoid or delusional. Thought content does not include homicidal or suicidal ideation. Thought content does not include homicidal or suicidal plan.   Review of Systems  Constitutional:  Negative for chills and fever.  Respiratory:  Negative for cough and shortness of breath.   Cardiovascular:  Negative for chest pain and palpitations.  Gastrointestinal:  Negative for abdominal pain, nausea and vomiting.  Neurological:  Negative for weakness and headaches.  Psychiatric/Behavioral:  Positive for hallucinations (sees images- do not command him to take action). Negative for substance abuse and suicidal ideas.   Blood pressure  111/87, pulse 66, temperature 98.2 F (36.8 C), temperature source Oral, resp. rate 18, SpO2 99 %. There is no height or weight on file to calculate  BMI.  Musculoskeletal: Strength & Muscle Tone: within normal limits Gait & Station: normal Patient leans: N/A   Recommendations:  Based on my evaluation the patient does not appear to have an emergency medical condition. Recommend continuous observation at [facility].Doran Heater, NP accepts patient. Will transport via safe transport. EMTALA completed. Doran Heater, NP will notify nursing staff at [facility]. Patient and brother agree with this plan.   Novella Olive, NP 01/31/2021, 3:52 PM

## 2021-01-31 NOTE — ED Notes (Signed)
Patient arrived on unit.

## 2021-02-01 DIAGNOSIS — F251 Schizoaffective disorder, depressive type: Secondary | ICD-10-CM | POA: Diagnosis not present

## 2021-02-01 MED ORDER — PALIPERIDONE PALMITATE ER 156 MG/ML IM SUSY: 156.0000 mg | PREFILLED_SYRINGE | INTRAMUSCULAR | 0 refills | Status: DC

## 2021-02-01 NOTE — ED Notes (Signed)
Pt resting in no acute distress. RR even and unlabored. Safety maintained. 

## 2021-02-01 NOTE — ED Notes (Signed)
Pt talking with provider. No acute distress noted. Safety maintained. 

## 2021-02-01 NOTE — ED Notes (Signed)
Patient received AVS with follow up community services.

## 2021-02-01 NOTE — ED Notes (Signed)
RN went to attempt blood draw from pt and pt stated, "Y'all are making me become an asshole. I refuse. If I give this bloodwork, you're going to discharge me, right? Well, I refuse. Now leave me alone". Pt returned to a lying position away from RN. Inetta Fermo, NP notified of pt's defiant behaviors. Will continue to monitor for safety.

## 2021-02-01 NOTE — ED Notes (Signed)
Sandwich given upon request

## 2021-02-01 NOTE — ED Provider Notes (Signed)
FBC/OBS ASAP Discharge Summary  Date and Time: 02/01/2021 12:56 PM  Name: Anthony Knox  MRN:  [id number]   Discharge Diagnoses:  Final diagnoses:  Schizoaffective disorder, depressive type [facility])    Subjective: Anthony Knox reports readiness to discharge home.  He states "I feel better."  He reports that his brother picked him up at his home yesterday and did not make him aware he would be seeking psychiatric assessment.  Patient is assessed face-to-face by nurse practitioner. He is seated in assessment area, no acute distress.  He is alert and partially oriented, pleasant and cooperative during assessment.  He denies suicidal and homicidal ideations.  He denies any history of suicide attempts, denies history of self-harm.  He contracts verbally for safety with this Clinical research associate.  He has normal speech and behavior.  He endorses auditory hallucinations, states "I keep hearing things squeaking."  He denies command hallucinations.  He denies visual hallucinations.  Patient is able to converse coherently with goal-directed thoughts and no distractibility or preoccupation.  He denies paranoia.  Objectively there is no evidence of psychosis/mania or delusional thinking.  Patient resides in [place], alone.  Patient's brother visits the home daily to assist with medication management and transportation needs.  He is currently not employed.  He denies alcohol and substance use.  He endorses average sleep and appetite.  He is currently not followed by outpatient psychiatry with plans to possibly join [facility] services.  Patient offered support and encouragement.  He gives verbal consent to speak with his brother, [name] phone number [phone].  Spoke with patient's brother who denies concern for patient safety, reports patient would be able to access his home as it was left unlocked if he can get transportation otherwise brother will pick him up around 4:00 today.  His brother states he is currently working  with Medicaid and care manager to have Anthony Knox placed states "he needs to be somewhere where he can be made to take his medications, I would like for him to be in an assisted living."   Stay Summary:  HPI from 01/31/2021 [name] accepted to [facility] behavioral health for overnight observation after walk-in assessment completed at [facility] behavioral health. He  is assessed face-to-face by nurse practitioner.  He is seated in assessment area, no acute distress.   He is alert and oriented to self and situation. He believes the current city is [place], [place].  He states the current president is [place], patient is reoriented by this provider.  He is pleasant and cooperative during assessment. He reports depressed mood with congruent affect.  He denies suicidal and homicidal ideations.  Denies history of self-harm.  He contracts verbally for safety with this Clinical research associate.  He has normal speech and behavior.  He endorses both auditory and visual hallucinations.  He states "I see images, like things staring at me, mean looking eyes and heads, like a monster or something."  He endorses auditory hallucinations, states "I hear the wind and people walking around in my yard."  Denies command hallucinations.  Patient is able to converse coherently with goal-directed thoughts and no distractibility or preoccupation.  He endorses  paranoia, states he felt he heard "something on my patio last night, but I was afraid to look."   Has been diagnosed with schizoaffective disorder.  He reports he has not taken his medications since discharge from the hospital as they "made me agitated and sleepy."  He denies any current outpatient psychiatry follow-up.  He reports "I have  had a bunch of people over the years, but they were not effective and there was a conflict of interest."  No explanation as to nature of outpatient providers "conflict of interest."   He presents with tangential conversation states "I am trying to reach  retrace history, and do maps, I want to be a geologist."  Continues with tangential conversation states "things are not out of place, I think it is just the wrong spelling."   Anthony Knox lives alone in [place].  He denies access to weapons.  He reports feeling hungry states he has not eaten today at all, ate very little yesterday.  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  He feels "when I put food in the refrigerator, it does not keep, it gets too cold."  He reports he would like something to eat.  Anthony Knox reports average appetite and decreased sleep.  He denies alcohol and substance use.  He reports history of alcohol use disorder, states "I used to love beer, I love the taste but I was not addicted."  He reports last use of alcohol 4 months ago.   Anthony Knox is noted to be wearing an ankle monitor.  He reports this is required by his parole officer, [name] phone number [phone], he meets with Ms. Turner 2 times per week when she visits his home.   Patient provided support and encouragement.  Discussed admission to overnight observation area and initiation of medications, patient agrees with treatment plan.   Total Time spent with patient: 30 minutes  Past Psychiatric History:  Past Medical History:  Past Medical History:  Diagnosis Date   Schizophrenia (HCC)    No past surgical history on file. Family History: No family history on file. Family Psychiatric History: none reported Social History:  Social History   Substance and Sexual Activity  Alcohol Use Not Currently     Social History   Substance and Sexual Activity  Drug Use Never    Social History   Socioeconomic History   Marital status: Single    Spouse name: Not on file   Number of children: Not on file   Years of education: Not on file   Highest education level: Not on file  Occupational History   Not on file  Tobacco Use   Smoking status: Never   Smokeless tobacco: Never  Vaping Use   Vaping Use:  Never used  Substance and Sexual Activity   Alcohol use: Not Currently   Drug use: Never   Sexual activity: Not Currently  Other Topics Concern   Not on file  Social History Narrative   Not on file   Social Determinants of Health   Financial Resource Strain: Not on file  Food Insecurity: Not on file  Transportation Needs: Not on file  Physical Activity: Not on file  Stress: Not on file  Social Connections: Not on file   SDOH:  SDOH Screenings   Alcohol Screen: Low Risk    Last Alcohol Screening Score (AUDIT): 0  Depression (PHQ2-9): Medium Risk   PHQ-2 Score: 27  Financial Resource Strain: Not on file  Food Insecurity: Not on file  Housing: Not on file  Physical Activity: Not on file  Social Connections: Not on file  Stress: Not on file  Tobacco Use: Low Risk    Smoking Tobacco Use: Never   Smokeless Tobacco Use: Never  Transportation Needs: Not on file    Tobacco Cessation:  A prescription for an FDA-approved tobacco cessation medication was offered  at discharge and the patient refused  Current Medications:  Current Facility-Administered Medications  Medication Dose Route Frequency Provider Last Rate Last Admin   acetaminophen (TYLENOL) tablet 650 mg  650 mg Oral Q6H PRN Lenard LanceAllen, Rhett Najera L, FNP       alum & mag hydroxide-simeth (MAALOX/MYLANTA) 200-200-20 MG/5ML suspension 30 mL  30 mL Oral Q4H PRN Lenard LanceAllen, Emmajo Bennette L, FNP       benztropine (COGENTIN) tablet 0.5 mg  0.5 mg Oral Nilsa NuttingBH-qamhs Mehlani Blankenburg L, FNP   0.5 mg at 02/01/21 [id number]   hydrOXYzine (ATARAX/VISTARIL) tablet 25 mg  25 mg Oral TID PRN Lenard LanceAllen, Aairah Negrette L, FNP       levothyroxine (SYNTHROID) tablet 50 mcg  50 mcg Oral Q0600 Lenard LanceAllen, Satya Bohall L, FNP   50 mcg at 02/01/21 0800   magnesium hydroxide (MILK OF MAGNESIA) suspension 30 mL  30 mL Oral Daily PRN Lenard LanceAllen, Glendy Barsanti L, FNP       paliperidone (INVEGA SUSTENNA) injection 156 mg  156 mg Intramuscular Q28 days Lenard LanceAllen, Jahmeir Geisen L, FNP   156 mg at 01/31/21 1421   polyethylene glycol (MIRALAX  / GLYCOLAX) packet 17 g  17 g Oral Daily Lenard LanceAllen, Tyshae Stair L, FNP   17 g at 02/01/21 0931   ramelteon (ROZEREM) tablet 8 mg  8 mg Oral QHS Lenard LanceAllen, Jayanth Szczesniak L, FNP       risperiDONE (RISPERDAL) tablet 2 mg  2 mg Oral QHS Lenard LanceAllen, Emrys Mceachron L, FNP       senna (SENOKOT) tablet 8.6 mg  1 tablet Oral Daily Lenard LanceAllen, Shania Bjelland L, FNP   8.6 mg at 02/01/21 [id number]   Current Outpatient Medications  Medication Sig Dispense Refill   benztropine (COGENTIN) 0.5 MG tablet Take 1 tablet (0.5 mg total) by mouth 2 (two) times daily in the am and at bedtime.. For prevention of drug induced tremors (Patient not taking: Reported on 01/31/2021) 60 tablet 0   divalproex (DEPAKOTE ER) 250 MG 24 hr tablet Take 5 tablets (1,250 mg total) by mouth at bedtime. For mood stabilization (Patient not taking: Reported on 01/31/2021) 150 tablet 0   hydrOXYzine (ATARAX/VISTARIL) 25 MG tablet Take 1 tablet (25 mg total) by mouth every 6 (six) hours as needed for anxiety (Sleep). (Patient not taking: Reported on 01/31/2021) 75 tablet 0   levothyroxine (SYNTHROID) 50 MCG tablet Take 1 tablet (50 mcg total) by mouth daily at 6 (six) AM. For thyroid hormone replacement (Patient not taking: Reported on 01/31/2021) 15 tablet 0   paliperidone (INVEGA SUSTENNA) 156 MG/ML SUSY injection Inject 1 mL (156 mg total) into the muscle every 28 (twenty-eight) days. (Due 01-30-21): For mood control (Patient not taking: Reported on 01/31/2021) 1.2 mL 0   polyethylene glycol (MIRALAX / GLYCOLAX) 17 g packet Take 17 g by mouth daily. (Patient not taking: Reported on 01/31/2021) 14 each 0   ramelteon (ROZEREM) 8 MG tablet Take 1 tablet (8 mg total) by mouth at bedtime. For sleep (Patient not taking: Reported on 01/31/2021) 15 tablet 0   risperiDONE (RISPERDAL) 2 MG tablet Take 1 tablet (2 mg total) by mouth at bedtime. For mood control (Patient not taking: Reported on 01/31/2021) 30 tablet 0   senna (SENOKOT) 8.6 MG TABS tablet Take 1 tablet (8.6 mg total) by mouth daily. (May buy from over  the counter): For constipation (Patient not taking: Reported on 01/31/2021) 1 tablet 0    PTA Medications: (Not in a hospital admission)   Musculoskeletal  Strength & Muscle Tone: within normal limits Gait &  Station: normal Patient leans: N/A  Psychiatric Specialty Exam  Presentation  General Appearance: Appropriate for Environment; Casual  Eye Contact:Good  Speech:Clear and Coherent; Normal Rate  Speech Volume:Normal  Handedness:Right   Mood and Affect  Mood:Euthymic  Affect:Appropriate; Congruent   Thought Process  Thought Processes:Coherent; Goal Directed  Descriptions of Associations:Intact  Orientation:Partial  Thought Content:Logical; WDL  Diagnosis of Schizophrenia or Schizoaffective disorder in past: Yes  Duration of Psychotic Symptoms: Less than six months   Hallucinations:Hallucinations: None Description of Auditory Hallucinations: "I hear the wind and people walking in my yard" Description of Visual Hallucinations: "I see images like things staring at me, mean looking eyes in his, like a monster or something."  Ideas of Reference:None  Suicidal Thoughts:Suicidal Thoughts: No  Homicidal Thoughts:Homicidal Thoughts: No   Sensorium  Memory:Immediate Fair; Recent Poor; Remote Poor  Judgment:Fair  Insight:Present   Executive Functions  Concentration:Fair  Attention Span:Fair  Recall:Fair  Fund of Knowledge:Good  Language:Good   Psychomotor Activity  Psychomotor Activity:Psychomotor Activity: Normal   Assets  Assets:Communication Skills; Desire for Improvement; Financial Resources/Insurance; Housing; Intimacy; Leisure Time; Physical Health; Resilience; Social Support   Sleep  Sleep:Sleep: Fair Number of Hours of Sleep: 6   Nutritional Assessment (For OBS and FBC admissions only) Has the patient had a weight loss or gain of 10 pounds or more in the last 3 months?: No Has the patient had a decrease in food intake/or appetite?:  Yes Does the patient have dental problems?: No Does the patient have eating habits or behaviors that may be indicators of an eating disorder including binging or inducing vomiting?: No Has the patient recently lost weight without trying?: No Has the patient been eating poorly because of a decreased appetite?: No Malnutrition Screening Tool Score: 0    Physical Exam  Physical Exam Vitals and nursing note reviewed.  Constitutional:      Appearance: Normal appearance. He is well-developed and normal weight.  HENT:     Head: Normocephalic and atraumatic.     Nose: Nose normal.  Cardiovascular:     Rate and Rhythm: Normal rate.  Pulmonary:     Effort: Pulmonary effort is normal.  Musculoskeletal:        General: Normal range of motion.     Cervical back: Normal range of motion.  Neurological:     Mental Status: He is alert. Mental status is at baseline.  Psychiatric:        Attention and Perception: Attention and perception normal.        Mood and Affect: Mood and affect normal.        Speech: Speech normal.        Behavior: Behavior normal. Behavior is cooperative.        Thought Content: Thought content normal.        Cognition and Memory: Cognition normal. Memory is impaired.        Judgment: Judgment normal.   Review of Systems  Constitutional: Negative.   HENT: Negative.    Eyes: Negative.   Respiratory: Negative.    Cardiovascular: Negative.   Gastrointestinal: Negative.   Genitourinary: Negative.   Musculoskeletal: Negative.   Skin: Negative.   Neurological: Negative.   Endo/Heme/Allergies: Negative.   Psychiatric/Behavioral: Negative.    Blood pressure 103/73, pulse 63, temperature 98.4 F (36.9 C), temperature source Oral, resp. rate 16, SpO2 96 %. There is no height or weight on file to calculate BMI.  Demographic Factors:  Male, Caucasian, and Living alone  Loss Factors:  NA  Historical Factors: NA  Risk Reduction Factors:   Positive social support,  Positive therapeutic relationship, and Positive coping skills or problem solving skills  Continued Clinical Symptoms:  Previous Psychiatric Diagnoses and Treatments  Cognitive Features That Contribute To Risk:  None    Suicide Risk:  Minimal: No identifiable suicidal ideation.  Patients presenting with no risk factors but with morbid ruminations; may be classified as minimal risk based on the severity of the depressive symptoms  Plan Of Care/Follow-up recommendations:  Follow-up with outpatient psychiatry at [facility], an appointment has been scheduled for you. Continue current medications.  Disposition: Discharge  Lenard Lance, FNP 02/01/2021, 12:56 PM

## 2021-02-01 NOTE — ED Notes (Signed)
Pt sitting up on bed eating breakfast. Denies SI/HI/AVH. Calm, cooperative with staff. Denies concerns at present. Informed pt to notify staff with any needs. Safety maintained.

## 2021-02-01 NOTE — Progress Notes (Signed)
CSW spoke with the patient to obtain verbal permission to schedule an appointment with a provider at [facility], NP request.   Patient provided verbal consent for CSW to schedule a follow up appointment.  CSW utilized the [employer] to identify local providers that accept pt's insurance.  CSW contacted the following:  [facility]: Office closed. [facility]: New patient's are scheduled out to October, no emergency appointments, could put name on referral list and would receive a call within 7-14 business days to get scheduled [facility]: Left HIPAA compliant voicemail [facility]: CSW left HIPAA compliant voicemail.  [facility]: Patient needs to be registered to be scheduled.  There is a $150 registration fee that needs to be paid at time of registration.  Patient's brother can call and have this taken care of. [facility]: Only accepting TMS patient's no longer providing medication management.   [facility]: 02/19/2021 at 3:30PM Dr. Wynelle Link at the [place] location [facility]: Do not provide injections.  Penni Homans, MSW, LCSW 02/01/2021 2:54 PM

## 2021-02-01 NOTE — ED Notes (Signed)
Pt sleeping in no acute distress. RR even and unlabored. Safety maintained. 

## 2021-02-01 NOTE — ED Notes (Signed)
Pt sleeping@this time. Breathing even and unlabored. Will continue to monitor for safety 

## 2021-02-01 NOTE — Discharge Instructions (Addendum)

## 2021-02-01 NOTE — ED Notes (Signed)
Pt given meal

## 2021-02-01 NOTE — ED Notes (Signed)
Snack provided upon request

## 2021-11-23 IMAGING — CT CT HEAD W/O CM
3 series · 16 of 47 positions shown, 19 images · non-contrast
Comparison: None.

CLINICAL DATA: Delirium.

EXAM:
CT HEAD WITHOUT CONTRAST
TECHNIQUE: Contiguous axial images were obtained from the base of the skull
through the vertex without intravenous contrast.

[Series 3: head 5.0 h30s · axial · 0.43mm/px · z∈[-90,+50]mm · 10 of 34 slices shown, 13 images]
[im 3/34  brain]
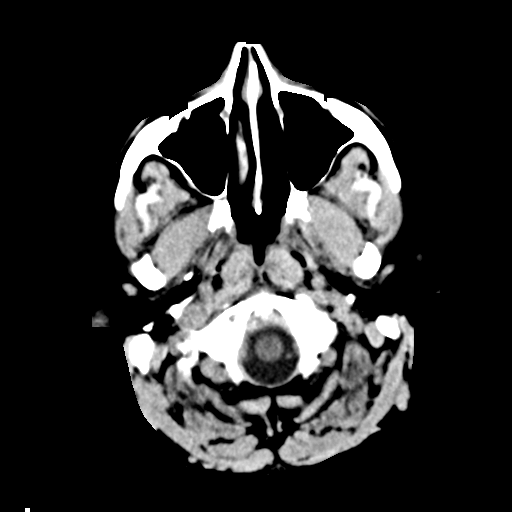
[im 3/34  bone]
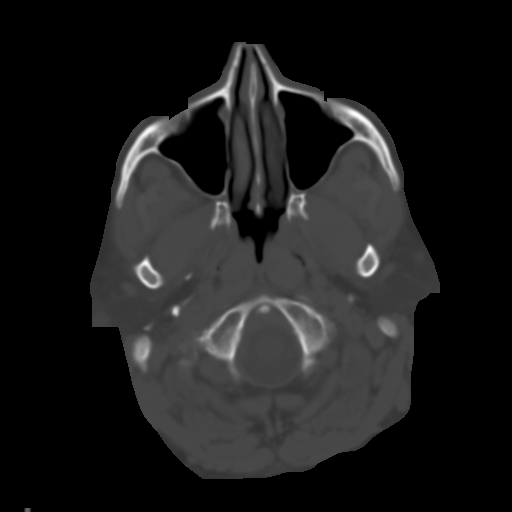
[im 6/34  brain]
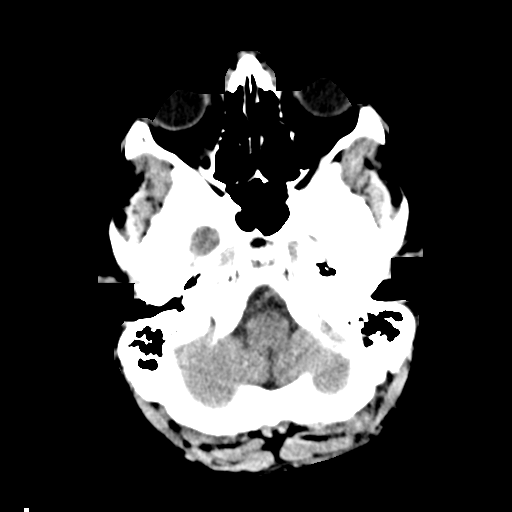
[im 10/34  brain]
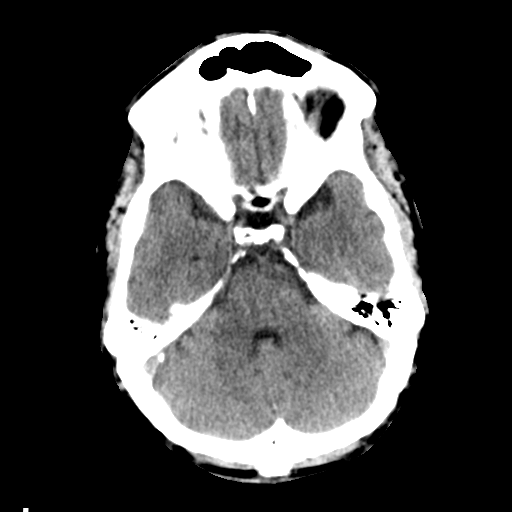
[im 12/34  brain]
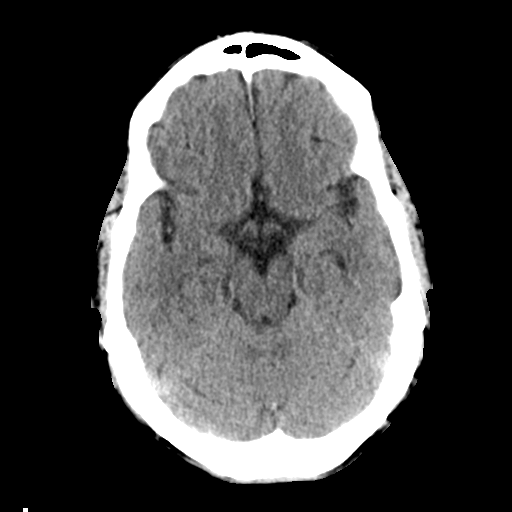
[im 15/34  brain]
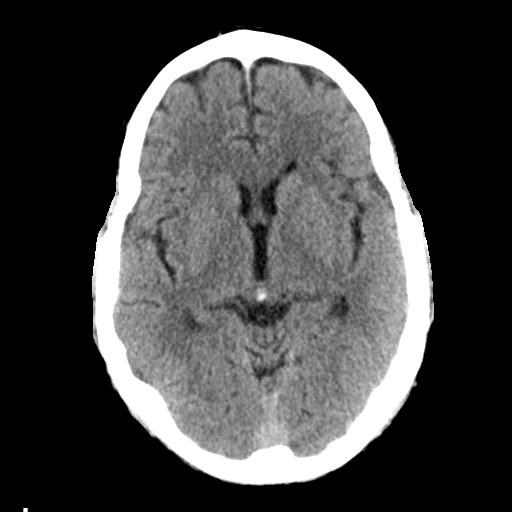
[im 15/34  bone]
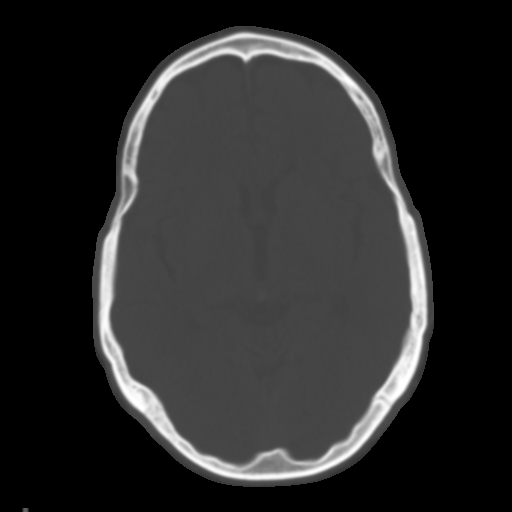
[im 19/34  brain]
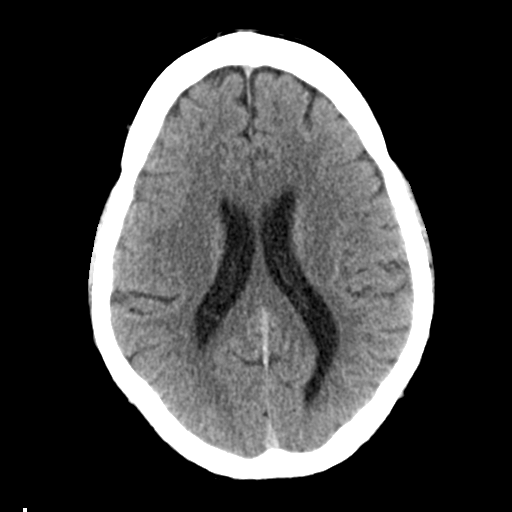
[im 22/34  brain]
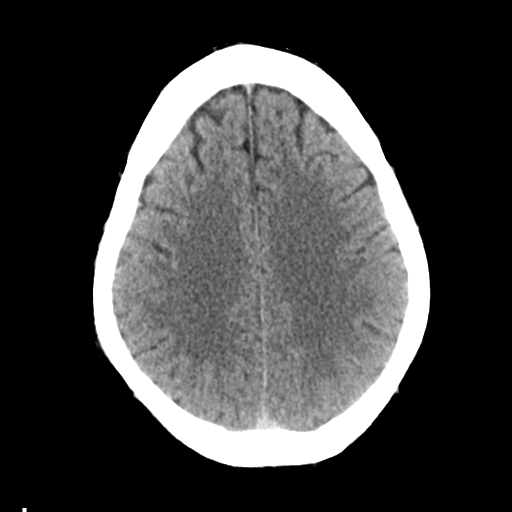
[im 26/34  brain]
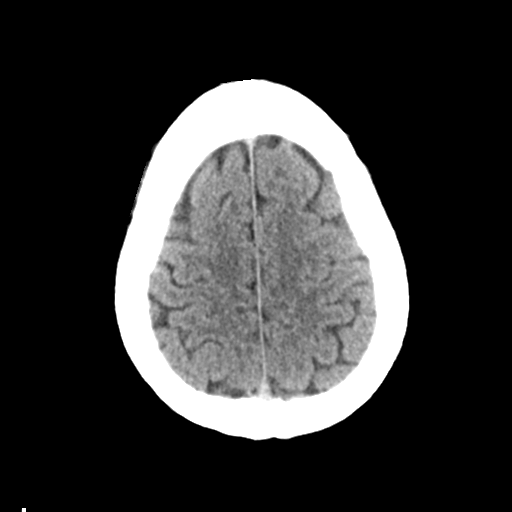
[im 28/34  brain]
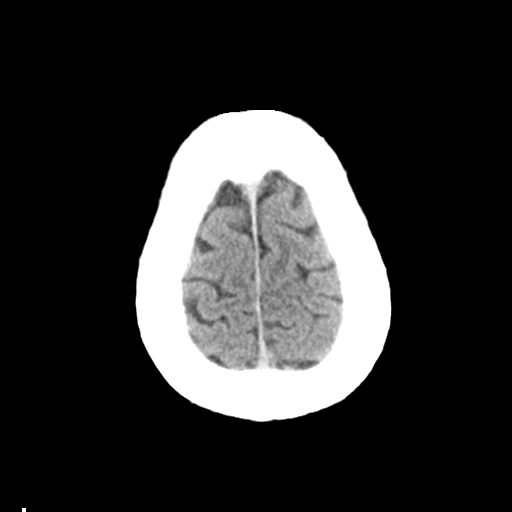
[im 28/34  bone]
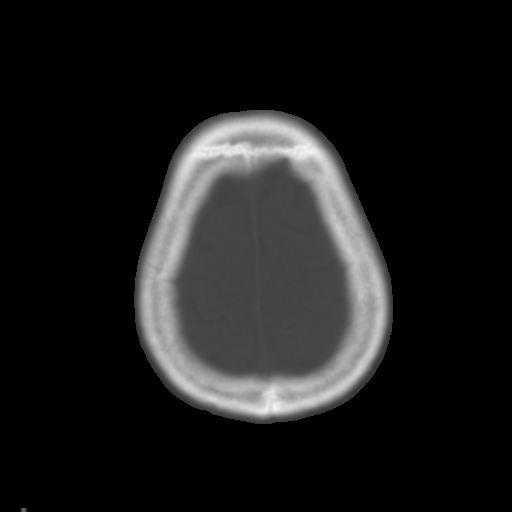
[im 31/34  brain]
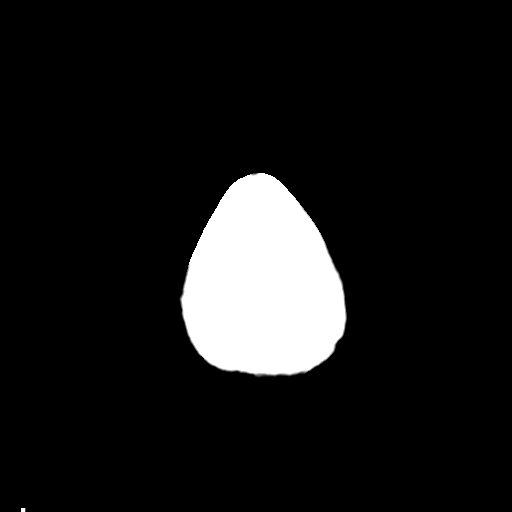

[Series 5: head 3.0 mpr cor · coronal · 0.33mm/px · 3 of 74 slices shown]
[im 25/74  brain]
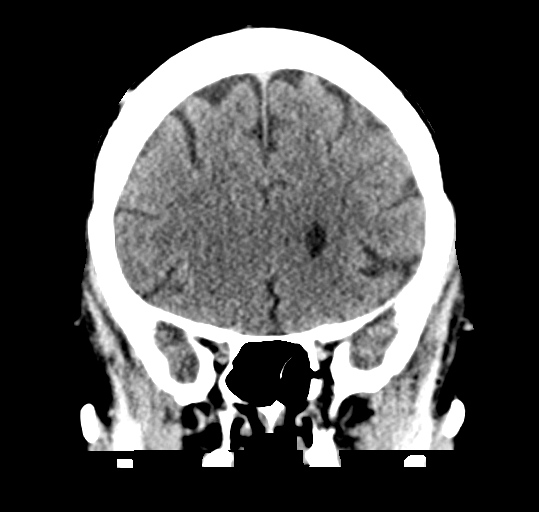
[im 33/74  brain]
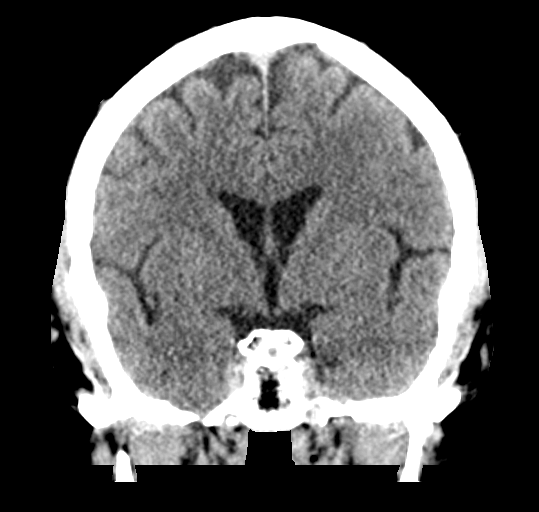
[im 41/74  brain]
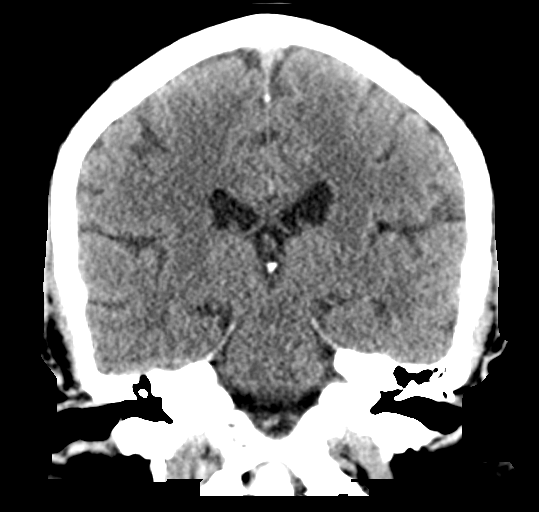

[Series 6: head 3.0 mpr sag · sagittal · 0.33mm/px · 3 of 62 slices shown]
[im 21/62  brain]
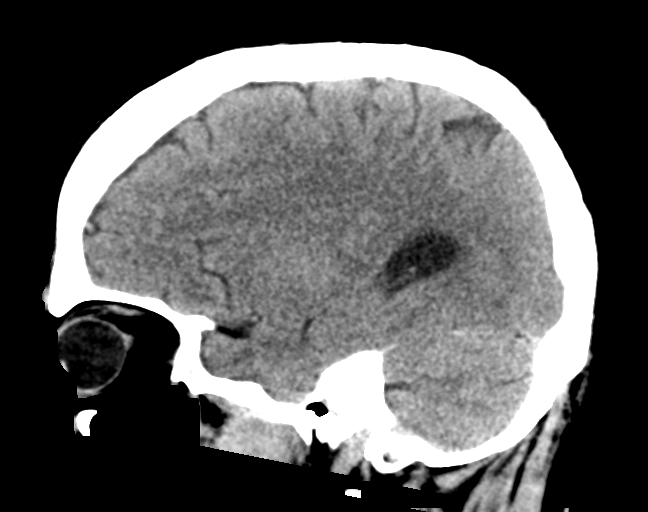
[im 31/62  brain]
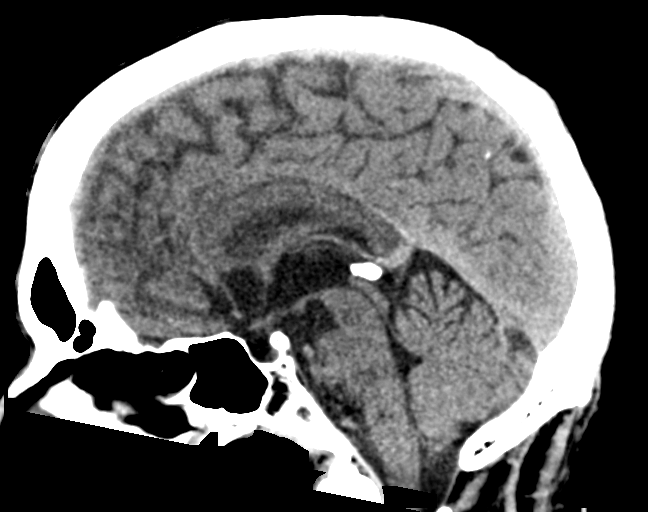
[im 41/62  brain]
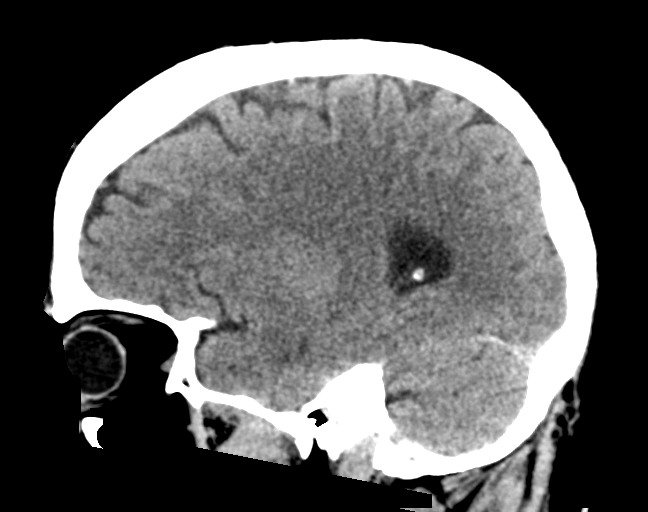

[16 of 47 positions shown; findings below may reference images not displayed]

FINDINGS: Brain: No evidence of acute large vascular territory infarction,
hemorrhage, hydrocephalus, extra-axial collection or mass
lesion/mass effect. Partially empty sella.

Vascular: No hyperdense vessel identified.

Skull: No acute fracture.

Sinuses/Orbits: Visualized sinuses are largely clear. Unremarkable
orbits.

Other: No mastoid effusions.
IMPRESSION: 1. No evidence of acute intracranial abnormality.
2. Partially empty sella. This finding is nonspecific and may be
incidental, but can be seen with idiopathic intracranial
hypertension in the correct clinical setting.

## 2021-11-28 ENCOUNTER — Encounter (HOSPITAL_COMMUNITY): Payer: Self-pay | Admitting: Emergency Medicine

## 2021-11-28 ENCOUNTER — Emergency Department (HOSPITAL_COMMUNITY)
Admission: EM | Admit: 2021-11-28 | Discharge: 2021-11-29 | Disposition: A | Discharge status: Home / Self Care | Payer: PPO | Attending: Emergency Medicine | Admitting: Emergency Medicine

## 2021-11-28 DIAGNOSIS — Z20822 Contact with and (suspected) exposure to covid-19: Secondary | ICD-10-CM | POA: Insufficient documentation

## 2021-11-28 DIAGNOSIS — F329 Major depressive disorder, single episode, unspecified: Secondary | ICD-10-CM | POA: Diagnosis not present

## 2021-11-28 DIAGNOSIS — F509 Eating disorder, unspecified: Secondary | ICD-10-CM | POA: Insufficient documentation

## 2021-11-28 DIAGNOSIS — Z79899 Other long term (current) drug therapy: Secondary | ICD-10-CM | POA: Diagnosis not present

## 2021-11-28 DIAGNOSIS — F22 Delusional disorders: Secondary | ICD-10-CM | POA: Diagnosis not present

## 2021-11-28 DIAGNOSIS — E860 Dehydration: Secondary | ICD-10-CM | POA: Diagnosis not present

## 2021-11-28 DIAGNOSIS — Z008 Encounter for other general examination: Secondary | ICD-10-CM

## 2021-11-28 DIAGNOSIS — F250 Schizoaffective disorder, bipolar type: Secondary | ICD-10-CM | POA: Diagnosis present

## 2021-11-28 DIAGNOSIS — F209 Schizophrenia, unspecified: Secondary | ICD-10-CM

## 2021-11-28 DIAGNOSIS — F32A Depression, unspecified: Secondary | ICD-10-CM | POA: Diagnosis present

## 2021-11-28 DIAGNOSIS — Z724 Inappropriate diet and eating habits: Secondary | ICD-10-CM | POA: Diagnosis not present

## 2021-11-28 LAB — CBC WITH DIFFERENTIAL/PLATELET
Abs Immature Granulocytes: 0.02 10*3/uL (ref 0.00–0.07)
Basophils Absolute: 0 10*3/uL (ref 0.0–0.1)
Basophils Relative: 0 %
Eosinophils Absolute: 0 10*3/uL (ref 0.0–0.5)
Eosinophils Relative: 0 %
HCT: 47.5 % (ref 39.0–52.0)
Hemoglobin: 16 g/dL (ref 13.0–17.0)
Immature Granulocytes: 0 %
Lymphocytes Relative: 16 %
Lymphs Abs: 1.5 10*3/uL (ref 0.7–4.0)
MCH: 31.1 pg (ref 26.0–34.0)
MCHC: 33.7 g/dL (ref 30.0–36.0)
MCV: 92.2 fL (ref 80.0–100.0)
Monocytes Absolute: 1.4 10*3/uL — ABNORMAL HIGH (ref 0.1–1.0)
Monocytes Relative: 14 %
Neutro Abs: 6.6 10*3/uL (ref 1.7–7.7)
Neutrophils Relative %: 70 %
Platelets: 214 10*3/uL (ref 150–400)
RBC: 5.15 MIL/uL (ref 4.22–5.81)
RDW: 13.2 % (ref 11.5–15.5)
WBC: 9.5 10*3/uL (ref 4.0–10.5)
nRBC: 0 % (ref 0.0–0.2)

## 2021-11-28 LAB — COMPREHENSIVE METABOLIC PANEL
ALT: 11 U/L (ref 0–44)
AST: 21 U/L (ref 15–41)
Albumin: 4 g/dL (ref 3.5–5.0)
Alkaline Phosphatase: 51 U/L (ref 38–126)
Anion gap: 10 (ref 5–15)
BUN: 22 mg/dL — ABNORMAL HIGH (ref 6–20)
CO2: 22 mmol/L (ref 22–32)
Calcium: 9.1 mg/dL (ref 8.9–10.3)
Chloride: 105 mmol/L (ref 98–111)
Creatinine, Ser: 1.21 mg/dL (ref 0.61–1.24)
GFR, Estimated: >60 mL/min (ref >60)
Glucose, Bld: 154 mg/dL — ABNORMAL HIGH (ref 70–99)
Potassium: 4.3 mmol/L (ref 3.5–5.1)
Sodium: 137 mmol/L (ref 135–145)
Total Bilirubin: 0.8 mg/dL (ref 0.3–1.2)
Total Protein: 6.5 g/dL (ref 6.5–8.1)

## 2021-11-28 LAB — RESP PANEL BY RT-PCR (FLU A&B, COVID) ARPGX2
Influenza A by PCR: NEGATIVE
Influenza B by PCR: NEGATIVE
SARS Coronavirus 2 by RT PCR: NEGATIVE

## 2021-11-28 LAB — VALPROIC ACID LEVEL: Valproic Acid Lvl: <10 ug/mL — ABNORMAL LOW (ref 50.0–100.0)

## 2021-11-28 LAB — ACETAMINOPHEN LEVEL: Acetaminophen (Tylenol), Serum: <10 ug/mL — ABNORMAL LOW (ref 10–30)

## 2021-11-28 LAB — ETHANOL: Alcohol, Ethyl (B): <10 mg/dL (ref <10)

## 2021-11-28 LAB — TSH: TSH: 3.263 u[IU]/mL (ref 0.350–4.500)

## 2021-11-28 LAB — SALICYLATE LEVEL: Salicylate Lvl: <7 mg/dL — ABNORMAL LOW (ref 7.0–30.0)

## 2021-11-28 MED ORDER — LEVOTHYROXINE SODIUM 50 MCG PO TABS: 50.0000 ug | ORAL_TABLET | Freq: Every day | ORAL | Status: DC

## 2021-11-28 MED ORDER — RAMELTEON 8 MG PO TABS
8.0000 mg | ORAL_TABLET | Freq: Every day | ORAL | Status: DC
  Filled 2021-11-28: qty 1

## 2021-11-28 MED ORDER — HYDROXYZINE HCL 25 MG PO TABS: 25.0000 mg | ORAL_TABLET | Freq: Four times a day (QID) | ORAL | Status: DC | PRN

## 2021-11-28 MED ORDER — BENZTROPINE MESYLATE 0.5 MG PO TABS
0.5000 mg | ORAL_TABLET | ORAL | Status: DC
  Administered 2021-11-29: 0.5 mg via ORAL
  Filled 2021-11-28 (×2): qty 1

## 2021-11-28 MED ORDER — RISPERIDONE 2 MG PO TABS
2.0000 mg | ORAL_TABLET | Freq: Every day | ORAL | Status: DC
  Filled 2021-11-28: qty 1

## 2021-11-28 MED ORDER — SODIUM CHLORIDE 0.9 % IV BOLUS
1000.0000 mL | Freq: Once | INTRAVENOUS | Status: AC
  Administered 2021-11-28: 1000 mL via INTRAVENOUS

## 2021-11-28 MED ORDER — POLYETHYLENE GLYCOL 3350 17 G PO PACK: 17.0000 g | PACK | Freq: Every day | ORAL | Status: DC | PRN

## 2021-11-28 MED ORDER — SENNA 8.6 MG PO TABS: 1.0000 | ORAL_TABLET | Freq: Every day | ORAL | Status: DC | PRN

## 2021-11-28 NOTE — ED Provider Notes (Signed)
[facility] EMERGENCY DEPARTMENT Provider Note   CSN: [id number] Arrival date & time: 11/28/21  1804     History  Chief Complaint  Patient presents with   Medical Clearance    Anthony Knox is a 61 y.o. male.  Pt is a 61 yo male with a pmhx significant for schizophrenia and afib.  Pt was brought here by the sheriff from the jail for eval.  He has been refusing to eat/drink for the past 5 days.  He said he did not like the way the food tasted.  Pt is willing to eat here, but the sheriff is unwilling to uncuff him.      Home Medications Prior to Admission medications   Medication Sig Start Date End Date Taking? Authorizing Provider  benztropine (COGENTIN) 0.5 MG tablet Take 1 tablet (0.5 mg total) by mouth 2 (two) times daily in the am and at bedtime.. For prevention of drug induced tremors 01/04/21   Lindell Spar I, NP  hydrOXYzine (ATARAX/VISTARIL) 25 MG tablet Take 1 tablet (25 mg total) by mouth every 6 (six) hours as needed for anxiety (Sleep). 01/04/21   Lindell Spar I, NP  levothyroxine (SYNTHROID) 50 MCG tablet Take 1 tablet (50 mcg total) by mouth daily at 6 (six) AM. For thyroid hormone replacement 01/05/21   Lindell Spar I, NP  paliperidone (INVEGA SUSTENNA) 156 MG/ML SUSY injection Inject 1 mL (156 mg total) into the muscle every 28 (twenty-eight) days. Next dose due 02/28/2021. 02/28/21   Lucky Rathke, FNP  polyethylene glycol (MIRALAX / GLYCOLAX) 17 g packet Take 17 g by mouth daily. 12/13/20   Dagar, Meredith Staggers, MD  ramelteon (ROZEREM) 8 MG tablet Take 1 tablet (8 mg total) by mouth at bedtime. For sleep 01/04/21   Lindell Spar I, NP  risperiDONE (RISPERDAL) 2 MG tablet Take 1 tablet (2 mg total) by mouth at bedtime. For mood control 01/04/21   Lindell Spar I, NP  senna (SENOKOT) 8.6 MG TABS tablet Take 1 tablet (8.6 mg total) by mouth daily. (May buy from over the counter): For constipation 01/05/21   Lindell Spar I, NP  divalproex (DEPAKOTE ER) 250 MG 24 hr tablet  Take 5 tablets (1,250 mg total) by mouth at bedtime. For mood stabilization Patient not taking: Reported on 01/31/2021 01/04/21 02/01/21  Encarnacion Slates, NP      Allergies    Geodon [ziprasidone hcl]    Review of Systems   Review of Systems  All other systems reviewed and are negative.  Physical Exam Updated Vital Signs BP 110/85   Pulse (!) 57   Temp 98.7 F (37.1 C) (Oral)   Resp 13   SpO2 99%  Physical Exam Vitals and nursing note reviewed.  Constitutional:      Appearance: Normal appearance.  HENT:     Head: Normocephalic and atraumatic.     Right Ear: External ear normal.     Left Ear: External ear normal.     Nose: Nose normal.     Mouth/Throat:     Mouth: Mucous membranes are dry.  Eyes:     Extraocular Movements: Extraocular movements intact.     Conjunctiva/sclera: Conjunctivae normal.     Pupils: Pupils are equal, round, and reactive to light.  Cardiovascular:     Rate and Rhythm: Normal rate and regular rhythm.     Pulses: Normal pulses.     Heart sounds: Normal heart sounds.  Pulmonary:     Effort: Pulmonary effort  is normal.     Breath sounds: Normal breath sounds.  Abdominal:     General: Abdomen is flat. Bowel sounds are normal.     Palpations: Abdomen is soft.  Musculoskeletal:        General: Normal range of motion.     Cervical back: Normal range of motion and neck supple.  Skin:    General: Skin is warm.     Capillary Refill: Capillary refill takes less than 2 seconds.  Neurological:     General: No focal deficit present.     Mental Status: He is alert and oriented to person, place, and time.  Psychiatric:        Speech: Speech is tangential.        Thought Content: Thought content is paranoid.    ED Results / Procedures / Treatments   Labs (all labs ordered are listed, but only abnormal results are displayed) Labs Reviewed  COMPREHENSIVE METABOLIC PANEL - Abnormal; Notable for the following components:      Result Value   Glucose, Bld  154 (*)    BUN 22 (*)    All other components within normal limits  CBC WITH DIFFERENTIAL/PLATELET - Abnormal; Notable for the following components:   Monocytes Absolute 1.4 (*)    All other components within normal limits  SALICYLATE LEVEL - Abnormal; Notable for the following components:   Salicylate Lvl Q000111Q (*)    All other components within normal limits  ACETAMINOPHEN LEVEL - Abnormal; Notable for the following components:   Acetaminophen (Tylenol), Serum <10 (*)    All other components within normal limits  VALPROIC ACID LEVEL - Abnormal; Notable for the following components:   Valproic Acid Lvl <10 (*)    All other components within normal limits  RESP PANEL BY RT-PCR (FLU A&B, COVID) ARPGX2  ETHANOL  TSH  RAPID URINE DRUG SCREEN, HOSP PERFORMED    EKG EKG Interpretation  Date/Time:  Thursday Nov 28 2021 19:45:36 EDT Ventricular Rate:  84 PR Interval:    QRS Duration: 98 QT Interval:  391 QTC Calculation: 463 R Axis:   21 Text Interpretation: nsr with pvcs Confirmed by Isla Pence [phone]) on 11/28/2021 9:09:49 PM  Radiology No results found.  Procedures Procedures    Medications Ordered in ED Medications  sodium chloride 0.9 % bolus 1,000 mL (0 mLs Intravenous Stopped 11/28/21 2120)    ED Course/ Medical Decision Making/ A&P                           Medical Decision Making Amount and/or Complexity of Data Reviewed Labs: ordered.   This patient presents to the ED for concern of dehydration, this involves an extensive number of treatment options, and is a complaint that carries with it a high risk of complications and morbidity.  The differential diagnosis includes aki, electrolyte abn, infection   Co morbidities that complicate the patient evaluation  schizophrenia and afib   Additional history obtained:  Additional history obtained from epic chart review External records from outside source obtained and reviewed including sheriff  deputy   Lab Tests:  I Ordered, and personally interpreted labs.  The pertinent results include:  cbc is nl, cmp nl other than glucose elevated at 154; depakote level negative, sal neg, acet neg, etoh neg   Cardiac Monitoring:  The patient was maintained on a cardiac monitor.  I personally viewed and interpreted the cardiac monitored which showed an underlying rhythm of:  nsr   Medicines ordered and prescription drug management:   I have reviewed the patients home medicines and have made adjustments as needed    Consultations Obtained:  I requested consultation with the tts,  and discussed lab and imaging findings as well as pertinent plan - consult pending.  Pt is medically clear.   Problem List / ED Course:  Hunger strike:  pt has no evidence of aki or electrolyte abn.  Pt given 1L NS.  Depakote level neg.  It is unclear if the jail is giving the correct meds.  Jail requests a TTS consult.  If they feel pt can go back to jail, there is no reason to keep him in the hospital.    Reevaluation:  After the interventions noted above, I reevaluated the patient and found that they have :improved   Social Determinants of Health:  jail   Dispostion:  Pending at shift change.        Final Clinical Impression(s) / ED Diagnoses Final diagnoses:  Schizophrenia, unspecified type ([place])  Dehydration    Rx / DC Orders ED Discharge Orders     None         Isla Pence, MD 11/28/21 2256

## 2021-11-28 NOTE — ED Triage Notes (Signed)
Patient BIB Sheriff from jail for evaluation, patient has refused to eat and drink for the last five days. History of schizoaffective disorder, patient uncoopertive during triage answering only "that is everything" to questions (I.e. "Are you having chest pain?" "Chest pain is everything"   "Are you vomiting?" "Vomiting is everything"). Patient is in no apparent distress at this time.

## 2021-11-28 NOTE — ED Provider Triage Note (Signed)
Emergency Medicine Provider Triage Evaluation Note  Anthony Knox , a 61 y.o. male  was evaluated in triage.  Pt complains of everything hurting.  Patient sent to the ER from jail for refusing to eat or drink for 5 days and for a "wellness check." States everything is wrong.  Review of Systems  Positive: Decreased p.o. intake Negative:   Physical Exam  There were no vitals taken for this visit. Gen:   Awake, no distress   Resp:  Normal effort  MSK:   Moves extremities without difficulty  Other:  Appears calm  Medical Decision Making  Medically screening exam initiated at 6:12 PM.  Appropriate orders placed.  Anthony Knox was informed that the remainder of the evaluation will be completed by another provider, this initial triage assessment does not replace that evaluation, and the importance of remaining in the ED until their evaluation is complete.     Anthony Pates, PA-C 11/28/21 1814

## 2021-11-28 NOTE — ED Notes (Signed)
On arrival patient in hand cuffs and leg chains accompanied by [facility].  Upon talking with him he refused to answer questions as to his alertness and mental status answering "what difference does it make".  He appears alert and oriented. Initially agreed to have vitals taken, but then refused as he didn't like the way the Med Tech placed the cuff on his arm.  When ask if he would take his meds that are ordered he flatly refused.

## 2021-11-29 DIAGNOSIS — Z008 Encounter for other general examination: Secondary | ICD-10-CM

## 2021-11-29 DIAGNOSIS — Z724 Inappropriate diet and eating habits: Secondary | ICD-10-CM
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
## 2021-11-29 NOTE — ED Notes (Signed)
Awake and alert watching tv

## 2021-11-29 NOTE — ED Notes (Signed)
Refused vitals this morning.

## 2021-11-29 NOTE — ED Notes (Signed)
TTS in process 

## 2021-11-29 NOTE — ED Notes (Signed)
Escorted to restroom by staff and deputy in attendance, however, he refused  to use the restroom because he was not allowed to close the door completely.

## 2021-11-29 NOTE — ED Notes (Signed)
Per TTS, pt is psych cleared. EDP made aware

## 2021-11-29 NOTE — Consult Note (Cosign Needed Addendum)
Telepsych Consultation   Reason for Consult:  Psychiatric Assessment for not eating Referring Physician:  Jacalyn Lefevre, MD Location of Patient:    [facility] Location of Provider: Other: virtual home office  Patient Identification: Anthony Knox MRN:  [id number] Principal Diagnosis: Eating problem Diagnosis:  Principal Problem:   Eating problem Active Problems:   Schizoaffective disorder, bipolar type (HCC)   Evaluation by psychiatric service required   Total Time spent with patient: 30 minutes  Subjective:   Anthony Knox is a 61 y.o. male patient who is currently incarcerated, and has a hx of schizoaffective disorder, bipolar type.  Patient was brought to the hospital accompanied by police for evaluation of afib and refusing to eat.  HPI:   Patient seen via telepsych by this provider; chart reviewed and consulted with Dr. Lucianne Muss on 11/29/21.  On evaluation Anthony Knox is seen sitting upright in hospital gurney, dressed in orange prison scrubs and handcuffed to the hospital gurney.  When greeted by this writer her says hello.    Patient does not appear to be limited by psychiatric concerns, but is rather guarded, argumentative and appears unwilling to participate in the assessment today.  There is no negativism observed, he is appropriately interactive with his environment, does not appear to be responding to internal stimulus and does not appear confused.  Thus, calling into question secondary gain.  Anticipatory guidance provided and several meaningful attempts to engage him were made.  When asked specifically about safety concerns, if he's having any thoughts of wanting to harm yourself or harm others; hearing things or seeing things are aren't there he responds, "I'm not doing this and looks away from the camera." He does not offer responses to questions about his medications or appetite and cites the television as a barrier, despite the TV being turned down fully.    As it  seems his primary reason for evaluation is for afib and not eating, the patient has already been medically cleared.  Per review of chart, on admission he shared with the ED provider he was not eating because he did not like the food selection at jail but was open to eating while admitted.      Since admission, he's been pretty irritated and not as polite towards hospital staff. Per notes, he's refused vitals and did not want to use the restroom as the deputy did not allow him to fully close the door. He has not required any medications for behavioral concerns.  LFTs, TSH all WNL today to continue psych meds. No prolonged QT/QTC intervals seen on EKG.   Per EDP Admission Assessment 11/28/2021: Chief Complaint  Patient presents with   Medical Clearance      KENAI FLUEGEL is a 61 y.o. male.   Pt is a 61 yo male with a pmhx significant for schizophrenia and afib.  Pt was brought here by the sheriff from the jail for eval.  He has been refusing to eat/drink for the past 5 days.  He said he did not like the way the food tasted.  Pt is willing to eat here, but the sheriff is unwilling to uncuff him.    Past Psychiatric History: schizoaffective disorder  Risk to Self:  no Risk to Others:  no Prior Inpatient Therapy: unknown  Prior Outpatient Therapy:  unknown  Past Medical History:  Past Medical History:  Diagnosis Date   Schizophrenia (HCC)    No past surgical history on file. Family History: No  family history on file. Family Psychiatric  History: unknown Social History:  Social History   Substance and Sexual Activity  Alcohol Use Not Currently     Social History   Substance and Sexual Activity  Drug Use Never    Social History   Socioeconomic History   Marital status: Single    Spouse name: Not on file   Number of children: Not on file   Years of education: Not on file   Highest education level: Not on file  Occupational History   Not on file  Tobacco Use   Smoking status:  Never   Smokeless tobacco: Never  Vaping Use   Vaping Use: Never used  Substance and Sexual Activity   Alcohol use: Not Currently   Drug use: Never   Sexual activity: Not Currently  Other Topics Concern   Not on file  Social History Narrative   Not on file   Social Determinants of Health   Financial Resource Strain: Not on file  Food Insecurity: Not on file  Transportation Needs: Not on file  Physical Activity: Not on file  Stress: Not on file  Social Connections: Not on file   Additional Social History:    Allergies:   Allergies  Allergen Reactions   Geodon [Ziprasidone Hcl] Other (See Comments)    Arrythmia    Labs:  Results for orders placed or performed during the hospital encounter of 11/28/21 (from the past 48 hour(s))  Resp Panel by RT-PCR (Flu A&B, Covid) Nasopharyngeal Swab     Status: None   Collection Time: 11/28/21  6:04 PM   Specimen: Nasopharyngeal Swab; Nasopharyngeal(NP) swabs in vial transport medium  Result Value Ref Range   SARS Coronavirus 2 by RT PCR NEGATIVE NEGATIVE    Comment: (NOTE) SARS-CoV-2 target nucleic acids are NOT DETECTED.  The SARS-CoV-2 RNA is generally detectable in upper respiratory specimens during the acute phase of infection. The lowest concentration of SARS-CoV-2 viral copies this assay can detect is 138 copies/mL. A negative result does not preclude SARS-Cov-2 infection and should not be used as the sole basis for treatment or other patient management decisions. A negative result may occur with  improper specimen collection/handling, submission of specimen other than nasopharyngeal swab, presence of viral mutation(s) within the areas targeted by this assay, and inadequate number of viral copies(<138 copies/mL). A negative result must be combined with clinical observations, patient history, and epidemiological information. The expected result is Negative.  Fact Sheet for Patients:   BloggerCourse.comhttps://www.fda.gov/media/152166/download  Fact Sheet for Healthcare Providers:  SeriousBroker.ithttps://www.fda.gov/media/152162/download  This test is no t yet approved or cleared by the [place] FDA and  has been authorized for detection and/or diagnosis of SARS-CoV-2 by FDA under an Emergency Use Authorization (EUA). This EUA will remain  in effect (meaning this test can be used) for the duration of the COVID-19 declaration under Section 564(b)(1) of the Act, 21 U.S.C.section 360bbb-3(b)(1), unless the authorization is terminated  or revoked sooner.       Influenza A by PCR NEGATIVE NEGATIVE   Influenza B by PCR NEGATIVE NEGATIVE    Comment: (NOTE) The Xpert Xpress SARS-CoV-2/FLU/RSV plus assay is intended as an aid in the diagnosis of influenza from Nasopharyngeal swab specimens and should not be used as a sole basis for treatment. Nasal washings and aspirates are unacceptable for Xpert Xpress SARS-CoV-2/FLU/RSV testing.  Fact Sheet for Patients: BloggerCourse.comhttps://www.fda.gov/media/152166/download  Fact Sheet for Healthcare Providers: SeriousBroker.ithttps://www.fda.gov/media/152162/download  This test is not yet approved or cleared by the  [place] [employer] trader and has been authorized for detection and/or diagnosis of SARS-CoV-2 by FDA under an TEFL teacher (EUA). This EUA will remain in effect (meaning this test can be used) for the duration of the COVID-19 declaration under Section 564(b)(1) of the Act, 21 U.S.C. section 360bbb-3(b)(1), unless the authorization is terminated or revoked.  Performed at [facility], [address], [place], [place] [address]   Comprehensive metabolic panel     Status: Abnormal   Collection Time: 11/28/21  7:23 PM  Result Value Ref Range   Sodium 137 135 - 145 mmol/L   Potassium 4.3 3.5 - 5.1 mmol/L    Comment: SLIGHT HEMOLYSIS   Chloride 105 98 - 111 mmol/L   CO2 22 22 - 32 mmol/L   Glucose, Bld 154 (H) 70 - 99 mg/dL    Comment: Glucose reference  range applies only to samples taken after fasting for at least 8 hours.   BUN 22 (H) 6 - 20 mg/dL   Creatinine, Ser 9.14 0.61 - 1.24 mg/dL   Calcium 9.1 8.9 - 78.2 mg/dL   Total Protein 6.5 6.5 - 8.1 g/dL   Albumin 4.0 3.5 - 5.0 g/dL   AST 21 15 - 41 U/L   ALT 11 0 - 44 U/L   Alkaline Phosphatase 51 38 - 126 U/L   Total Bilirubin 0.8 0.3 - 1.2 mg/dL   GFR, Estimated >95 >62 mL/min    Comment: (NOTE) Calculated using the CKD-EPI Creatinine Equation (2021)    Anion gap 10 5 - 15    Comment: Performed at [facility], [address], [place], [place] [address]  Ethanol     Status: None   Collection Time: 11/28/21  7:23 PM  Result Value Ref Range   Alcohol, Ethyl (B) <10 <10 mg/dL    Comment: (NOTE) Lowest detectable limit for serum alcohol is 10 mg/dL.  For medical purposes only. Performed at [facility], [address] [place], [place] [address]   CBC with Diff     Status: Abnormal   Collection Time: 11/28/21  7:23 PM  Result Value Ref Range   WBC 9.5 4.0 - 10.5 K/uL   RBC 5.15 4.22 - 5.81 MIL/uL   Hemoglobin 16.0 13.0 - 17.0 g/dL   HCT 96.2 95.2 - 84.1 %   MCV 92.2 80.0 - 100.0 fL   MCH 31.1 26.0 - 34.0 pg   MCHC 33.7 30.0 - 36.0 g/dL   RDW 32.4 40.1 - 02.7 %   Platelets 214 150 - 400 K/uL   nRBC 0.0 0.0 - 0.2 %   Neutrophils Relative % 70 %   Neutro Abs 6.6 1.7 - 7.7 K/uL   Lymphocytes Relative 16 %   Lymphs Abs 1.5 0.7 - 4.0 K/uL   Monocytes Relative 14 %   Monocytes Absolute 1.4 (H) 0.1 - 1.0 K/uL   Eosinophils Relative 0 %   Eosinophils Absolute 0.0 0.0 - 0.5 K/uL   Basophils Relative 0 %   Basophils Absolute 0.0 0.0 - 0.1 K/uL   Immature Granulocytes 0 %   Abs Immature Granulocytes 0.02 0.00 - 0.07 K/uL    Comment: Performed at [facility], [address], [place], [place] [address]  Salicylate level     Status: Abnormal   Collection Time: 11/28/21  7:23 PM  Result Value Ref Range   Salicylate Lvl <7.0 (L) 7.0 - 30.0 mg/dL    Comment:  Performed at [facility]  [facility], [address] [place], [place] [address]  Acetaminophen level     Status: Abnormal   Collection Time: 11/28/21  7:23 PM  Result Value Ref Range   Acetaminophen (Tylenol), Serum <10 (L) 10 - 30 ug/mL    Comment: (NOTE) Therapeutic concentrations vary significantly. A range of 10-30 ug/mL  may be an effective concentration for many patients. However, some  are best treated at concentrations outside of this range. Acetaminophen concentrations >150 ug/mL at 4 hours after ingestion  and >50 ug/mL at 12 hours after ingestion are often associated with  toxic reactions.  Performed at [facility], [address], [place], [place] [address]   TSH     Status: None   Collection Time: 11/28/21  9:28 PM  Result Value Ref Range   TSH 3.263 0.350 - 4.500 uIU/mL    Comment: Performed by a 3rd Generation assay with a functional sensitivity of <=0.01 uIU/mL. Performed at [facility], [address], [place], [place] [address]   Valproic acid level     Status: Abnormal   Collection Time: 11/28/21  9:28 PM  Result Value Ref Range   Valproic Acid Lvl <10 (L) 50.0 - 100.0 ug/mL    Comment: RESULTS CONFIRMED BY MANUAL DILUTION Performed at [facility], [address] [place], [place] [address]     Medications:  Current Facility-Administered Medications  Medication Dose Route Frequency Provider Last Rate Last Admin   benztropine (COGENTIN) tablet 0.5 mg  0.5 mg Oral Fatima Sanger, MD   0.5 mg at 11/29/21 2956   hydrOXYzine (ATARAX) tablet 25 mg  25 mg Oral Q6H PRN Jacalyn Lefevre, MD       levothyroxine (SYNTHROID) tablet 50 mcg  50 mcg Oral Q0600 Jacalyn Lefevre, MD       polyethylene glycol (MIRALAX / GLYCOLAX) packet 17 g  17 g Oral Daily PRN Jacalyn Lefevre, MD       ramelteon (ROZEREM) tablet 8 mg  8 mg Oral QHS Jacalyn Lefevre, MD       risperiDONE (RISPERDAL) tablet 2 mg  2 mg Oral QHS Jacalyn Lefevre, MD       senna Mancel Parsons)  tablet 8.6 mg  1 tablet Oral Daily PRN Jacalyn Lefevre, MD       Current Outpatient Medications  Medication Sig Dispense Refill   prazosin (MINIPRESS) 2 MG capsule Take 2 mg by mouth at bedtime.     valproic acid (DEPAKENE) 250 MG/5ML solution Take by mouth See admin instructions. Give 15 ml by mouth twice daily     benztropine (COGENTIN) 0.5 MG tablet Take 1 tablet (0.5 mg total) by mouth 2 (two) times daily in the am and at bedtime.. For prevention of drug induced tremors (Patient not taking: Reported on 11/28/2021) 60 tablet 0   hydrOXYzine (ATARAX/VISTARIL) 25 MG tablet Take 1 tablet (25 mg total) by mouth every 6 (six) hours as needed for anxiety (Sleep). (Patient not taking: Reported on 11/28/2021) 75 tablet 0   levothyroxine (SYNTHROID) 50 MCG tablet Take 1 tablet (50 mcg total) by mouth daily at 6 (six) AM. For thyroid hormone replacement (Patient not taking: Reported on 11/28/2021) 15 tablet 0   paliperidone (INVEGA SUSTENNA) 156 MG/ML SUSY injection Inject 1 mL (156 mg total) into the muscle every 28 (twenty-eight) days. Next dose due 02/28/2021. (Patient not taking: Reported on 11/28/2021) 1.2 mL 0   polyethylene glycol (MIRALAX / GLYCOLAX) 17 g packet Take 17 g by  mouth daily. (Patient not taking: Reported on 11/28/2021) 14 each 0   ramelteon (ROZEREM) 8 MG tablet Take 1 tablet (8 mg total) by mouth at bedtime. For sleep (Patient not taking: Reported on 11/28/2021) 15 tablet 0   risperiDONE (RISPERDAL) 2 MG tablet Take 1 tablet (2 mg total) by mouth at bedtime. For mood control (Patient not taking: Reported on 11/28/2021) 30 tablet 0   senna (SENOKOT) 8.6 MG TABS tablet Take 1 tablet (8.6 mg total) by mouth daily. (May buy from over the counter): For constipation (Patient not taking: Reported on 11/28/2021) 1 tablet 0    Musculoskeletal: did not see patient ambulating but he's seen moving all extremities without concerns.  Strength & Muscle Tone: within normal limits Gait & Station:   deferred Patient leans: N/A   Psychiatric Specialty Exam:  Presentation  General Appearance: Casual; Appropriate for Environment (dressed in orange jail attire)  Eye Contact:Good  Speech:Clear and Coherent  Speech Volume:Normal  Handedness:Right   Mood and Affect  Mood:Irritable  Affect:Congruent; Constricted   Thought Process  Thought Processes:Coherent  Descriptions of Associations:Tangential  Orientation:Full (Time, Place and Person)  Thought Content:Logical  History of Schizophrenia/Schizoaffective disorder:Yes  Duration of Psychotic Symptoms:Less than six months  Hallucinations:Hallucinations: None (unable to assess as pt would not participate in assessment)  Ideas of Reference:None  Suicidal Thoughts:Suicidal Thoughts: -- (unable to assess as pt does not answer questions)  Homicidal Thoughts:Homicidal Thoughts: -- (unable to assess as pt does not answer questions)   Sensorium  Memory:-- (unable to determine as pt does not respond to questions)  Judgment:Fair (believe this may be baseline as pt currently incarcerated; pt there are no acute safety concerns as he is under the care of 24 staff)  Insight:Fair   Executive Functions  Concentration:Fair Pt does not want to participate in assessment, he over talks with Clinical research associate and does not consistently answer questions-appears elective and is selectively muted.  Attention Span:Fair  Recall:Fair  Fund of Knowledge:Fair (appears pt has secondary gain and unwilling to answer questions)  Language:Good   Psychomotor Activity  Psychomotor Activity:Psychomotor Activity: Normal   Assets  Assets:Financial Resources/Insurance   Sleep  Sleep:Sleep: Fair Number of Hours of Sleep: 6    Physical Exam: Physical Exam Constitutional:      Appearance: Normal appearance.  Cardiovascular:     Rate and Rhythm: Normal rate.     Pulses: Normal pulses.  Pulmonary:     Effort: Pulmonary effort is normal.   Musculoskeletal:        General: Normal range of motion.     Cervical back: Normal range of motion.  Neurological:     General: No focal deficit present.     Mental Status: He is alert and oriented to person, place, and time.  Psychiatric:        Attention and Perception: Perception normal.        Mood and Affect: Affect is blunt.        Speech: Speech is tangential.        Behavior: Behavior is uncooperative and agitated.        Thought Content: Thought content is not paranoid or delusional.        Cognition and Memory: Cognition normal.        Judgment: Judgment is impulsive.   Review of Systems  Constitutional: Negative.   HENT: Negative.    Eyes: Negative.   Respiratory: Negative.    Cardiovascular: Negative.   Genitourinary: Negative.   Musculoskeletal: Negative.  Skin: Negative.   Neurological: Negative.   Endo/Heme/Allergies: Negative.   Psychiatric/Behavioral:  Negative for hallucinations, memory loss and substance abuse. The patient is not nervous/anxious and does not have insomnia.   Blood pressure (!) 143/89, pulse 65, temperature 98.4 F (36.9 C), temperature source Oral, resp. rate 16, SpO2 97 %. There is no height or weight on file to calculate BMI.  Treatment Plan Summary: Patient is psych cleared. There are no s/sx of mental decompensation seen today.  Based on assessment, patient was open to eating while hospitalized, thus refusal of food while incarcerated appears elective and not mentally driven. Based on above, there are no grounds for involuntary commitment or inpatient psychiatric treatment.   Patient with hx of schizoaffective disorder, currently takes risperidone 2mg  po qhs; benztropine 0.5mg  qam and qhs; hydroxyzine 25mg  po q6h as needed for anxiety. Recommend he continue with home medications, no changes made today.   Patient is currently under 24 hour surveillance by jail/prison saff; medical and mental health services are available there as well.   Patient is released back to the care of the correctional facility.    Disposition: No evidence of imminent risk to self or others at present.   Patient does not meet criteria for psychiatric inpatient admission. Supportive therapy provided about ongoing stressors.  This service was provided via telemedicine using a 2-way, interactive audio and video technology.  Notified Dr. Tegeler, Maragaret RN, Cristal Deer, LCSW all informed of above status via secure chat.  Names of all persons participating in this telemedicine service and their role in this encounter. Name: Shah Insley. Damita Dunnings Role: Patient  Name: Tollie Pizza Role: PMHNP    Clent Ridges, NP 11/29/2021 1:31 PM

## 2021-11-29 NOTE — ED Notes (Signed)
TTS complete 

## 2021-11-29 NOTE — ED Notes (Signed)
Continues to have an angry disposition towards staff and deputy.  Refuses vitals and meds.

## 2021-11-29 NOTE — ED Notes (Signed)
At patient's request cbg was checked.

## 2023-09-29 ENCOUNTER — Emergency Department
Admission: EM | Admit: 2023-09-29 | Discharge: 2023-10-03 | Disposition: A | Discharge status: Other Acute Inpatient Hospital | Attending: Emergency Medicine | Admitting: Emergency Medicine

## 2023-09-29 ENCOUNTER — Encounter: Payer: Self-pay | Admitting: Emergency Medicine

## 2023-09-29 DIAGNOSIS — R451 Restlessness and agitation: Secondary | ICD-10-CM | POA: Insufficient documentation

## 2023-09-29 DIAGNOSIS — F259 Schizoaffective disorder, unspecified: Secondary | ICD-10-CM | POA: Diagnosis not present

## 2023-09-29 DIAGNOSIS — Z23 Encounter for immunization: Secondary | ICD-10-CM | POA: Diagnosis not present

## 2023-09-29 DIAGNOSIS — X58XXXA Exposure to other specified factors, initial encounter: Secondary | ICD-10-CM | POA: Diagnosis not present

## 2023-09-29 DIAGNOSIS — S0081XA Abrasion of other part of head, initial encounter: Secondary | ICD-10-CM | POA: Diagnosis not present

## 2023-09-29 DIAGNOSIS — F250 Schizoaffective disorder, bipolar type: Secondary | ICD-10-CM | POA: Insufficient documentation

## 2023-09-29 DIAGNOSIS — Z79899 Other long term (current) drug therapy: Secondary | ICD-10-CM | POA: Insufficient documentation

## 2023-09-29 LAB — COMPREHENSIVE METABOLIC PANEL
ALT: 12 U/L (ref 0–44)
AST: 18 U/L (ref 15–41)
Albumin: 4.8 g/dL (ref 3.5–5.0)
Alkaline Phosphatase: 56 U/L (ref 38–126)
Anion gap: 17 — ABNORMAL HIGH (ref 5–15)
BUN: 14 mg/dL (ref 6–20)
CO2: 17 mmol/L — ABNORMAL LOW (ref 22–32)
Calcium: 9.6 mg/dL (ref 8.9–10.3)
Chloride: 107 mmol/L (ref 98–111)
Creatinine, Ser: 0.95 mg/dL (ref 0.61–1.24)
GFR, Estimated: >60 mL/min (ref >60)
Glucose, Bld: 78 mg/dL (ref 70–99)
Potassium: 3.5 mmol/L (ref 3.5–5.1)
Sodium: 141 mmol/L (ref 135–145)
Total Bilirubin: 1.4 mg/dL — ABNORMAL HIGH (ref 0.0–1.2)
Total Protein: 7.6 g/dL (ref 6.5–8.1)

## 2023-09-29 LAB — CBC
HCT: 44.8 % (ref 39.0–52.0)
Hemoglobin: 14.7 g/dL (ref 13.0–17.0)
MCH: 29.9 pg (ref 26.0–34.0)
MCHC: 32.8 g/dL (ref 30.0–36.0)
MCV: 91.2 fL (ref 80.0–100.0)
Platelets: 237 10*3/uL (ref 150–400)
RBC: 4.91 MIL/uL (ref 4.22–5.81)
RDW: 13.7 % (ref 11.5–15.5)
WBC: 7.8 10*3/uL (ref 4.0–10.5)
nRBC: 0 % (ref 0.0–0.2)

## 2023-09-29 LAB — CBG MONITORING, ED
Glucose-Capillary: 70 mg/dL (ref 70–99)
Glucose-Capillary: 118 mg/dL — ABNORMAL HIGH (ref 70–99)

## 2023-09-29 LAB — SALICYLATE LEVEL: Salicylate Lvl: <7 mg/dL — ABNORMAL LOW (ref 7.0–30.0)

## 2023-09-29 LAB — ETHANOL: Alcohol, Ethyl (B): <10 mg/dL (ref <10)

## 2023-09-29 LAB — ACETAMINOPHEN LEVEL: Acetaminophen (Tylenol), Serum: <10 ug/mL — ABNORMAL LOW (ref 10–30)

## 2023-09-29 MED ORDER — TETANUS-DIPHTH-ACELL PERTUSSIS 5-2.5-18.5 LF-MCG/0.5 IM SUSY
0.5000 mL | PREFILLED_SYRINGE | Freq: Once | INTRAMUSCULAR | Status: AC
  Administered 2023-09-29: 0.5 mL via INTRAMUSCULAR
  Filled 2023-09-29: qty 0.5

## 2023-09-29 NOTE — ED Triage Notes (Signed)
 PT BIB [facility] from [facility] IVC for  being combative with staff and threatening suicide as well as threatening staff. Pt has been defecating in furniture drawers and using hands to spread on furniture. Pt refusing to answer any questions during triage.

## 2023-09-29 NOTE — ED Notes (Signed)
Patient received dinner tray at this time.  

## 2023-09-29 NOTE — ED Notes (Signed)
 Clothing:  Black Media planner Orange tshirt Engineer, site

## 2023-09-29 NOTE — ED Provider Notes (Signed)
 [facility] Provider Note    Event Date/Time   First MD Initiated Contact with Patient 09/29/23 1743     (approximate)   History   IVC   HPI  Anthony Knox is a 63 y.o. male past medical history significant for schizophrenia who was presenting to the emergency department from group home with involuntary commitment paperwork.  Patient was sent in from group home under IVC for suicidal and agitated behavior at the group home.  Reports of him being combative.  Defecating ensures.  Patient just states that there was a misunderstanding.  He is uncertain of his last tetanus shot.  Denies any active suicidal ideation at this time.     Physical Exam   Triage Vital Signs: ED Triage Vitals [09/29/23 1731]  Encounter Vitals Group     BP (!) 144/93     Systolic BP Percentile      Diastolic BP Percentile      Pulse Rate (!) 103     Resp 18     Temp 98.3 F (36.8 C)     Temp Source Oral     SpO2 98 %     Weight      Height      Head Circumference      Peak Flow      Pain Score      Pain Loc      Pain Education      Exclude from Growth Chart     Most recent vital signs: Vitals:   09/29/23 1731  BP: (!) 144/93  Pulse: (!) 103  Resp: 18  Temp: 98.3 F (36.8 C)  SpO2: 98%    Physical Exam Constitutional:      Appearance: He is well-developed.  HENT:     Head:     Comments: Superficial abrasions to his face. Eyes:     Extraocular Movements: Extraocular movements intact.     Conjunctiva/sclera: Conjunctivae normal.     Pupils: Pupils are equal, round, and reactive to light.  Cardiovascular:     Rate and Rhythm: Regular rhythm.  Pulmonary:     Effort: No respiratory distress.  Musculoskeletal:     Cervical back: Normal range of motion. No tenderness.  Skin:    General: Skin is warm.  Neurological:     Mental Status: He is alert. Mental status is at baseline.  Psychiatric:        Mood and Affect: Affect is flat.        Speech: Speech  normal.        Thought Content: Thought content includes suicidal ideation. Thought content does not include homicidal or suicidal plan.        Judgment: Judgment is impulsive.     IMPRESSION / MDM / ASSESSMENT AND PLAN / ED COURSE  I reviewed the triage vital signs and the nursing notes.  Differential diagnosis including psychosis, electrolyte abnormality, intoxication  IVC paperwork was sent with the patient.  Upheld IVC paperwork and plan for psychiatry evaluation.  On chart review has a history of schizophrenia and has been seen and evaluated by psychiatry in the past with prior inpatient hospitalizations.   Tetanus updated  LABS (all labs ordered are listed, but only abnormal results are displayed) Labs interpreted as -    Labs Reviewed  COMPREHENSIVE METABOLIC PANEL - Abnormal; Notable for the following components:      Result Value   CO2 17 (*)    Total Bilirubin 1.4 (*)  Anion gap 17 (*)    All other components within normal limits  CBC  ETHANOL  SALICYLATE LEVEL  ACETAMINOPHEN LEVEL  URINE DRUG SCREEN, QUALITATIVE ([facility] ONLY)  BASIC METABOLIC PANEL    MDM  No leukocytosis.  CO2 is low at 17 and did have an elevated anion gap of 17.  Glucose was 78.  Patient was provided with a diet.  Tetanus was updated.  Tolerating p.o.  Will recheck blood work.     The patient has been placed in psychiatric observation due to the need to provide a safe environment for the patient while obtaining psychiatric consultation and evaluation, as well as ongoing medical and medication management to treat the patient's condition.  The patient has been placed under full IVC at this time.   PROCEDURES:  Critical Care performed: No  Procedures  Patient's presentation is most consistent with acute presentation with potential threat to life or bodily function.   MEDICATIONS ORDERED IN ED: Medications  Tdap (BOOSTRIX) injection 0.5 mL (has no administration in time range)     FINAL CLINICAL IMPRESSION(S) / ED DIAGNOSES   Final diagnoses:  Agitated     Rx / DC Orders   ED Discharge Orders     None        Note:  This document was prepared using Dragon voice recognition software and may include unintentional dictation errors.   Corena Herter, MD 09/29/23 [phone]

## 2023-09-29 NOTE — ED Notes (Signed)
 IVC PENDING  CONSULT ?

## 2023-09-29 NOTE — BH Assessment (Signed)
 Comprehensive Clinical Assessment (CCA) Screening, Triage and Referral Note  09/29/2023 Anthony Knox [id number] Recommendations for Services/Supports/Treatments: Psych NP Marzella Schlein. determined pt. meets psychiatric inpatient criteria. Anthony Knox is a 63 year old Caucasian male with a hx of psychosis and Schizoaffective disorder, bipolar type. Pt is IVC'd. Per triage note: PT BIB [facility] from [facility] IVC for being combative with staff and threatening suicide as well as threatening staff. Pt has been defecating in furniture drawers and using hands to spread on furniture. Pt refusing to answer any questions during triage.  Upon assessment, Pt was resting, quietly.  When asked why he'd presented to the ER pt. stated, "I'm Choking. I have 2 bilateral; 1 pole. Someone was right all along". Pt was unable to answer any assessment questions appropriately. Pt presented with a euthymic mood and a responsive affect. Pt's thoughts were loose and irrelevant. Pt had circumstantial and nonsensical speech. When asked what he needed from the ED, the pt began rambling with word salad. The pt. had normal psychomotor activity. Patient unable to confirm/deny current SI, HI or AV/H. Pt's BAL is unremarkable; UDS is pending.   Chief Complaint:  Chief Complaint  Patient presents with   IVC   Visit Diagnosis: Schizoaffective disorder, bipolar type  Patient Reported Information How did you hear about [place]? Legal System  What Is the Reason for Your Visit/Call Today? No data recorded How Long Has This Been Causing You Problems? No data recorded What Do You Feel Would Help You the Most Today? No data recorded  Have You Recently Had Any Thoughts About Hurting Yourself? No data recorded Are You Planning to Commit Suicide/Harm Yourself At This time? No data recorded  Have you Recently Had Thoughts About Hurting Someone Anthony Knox? No data recorded Are You Planning to Harm Someone at This Time? No data  recorded Explanation: No data recorded  Have You Used Any Alcohol or Drugs in the Past 24 Hours? No data recorded How Long Ago Did You Use Drugs or Alcohol? No data recorded What Did You Use and How Much? No data recorded  Do You Currently Have a Therapist/Psychiatrist? No data recorded Name of Therapist/Psychiatrist: No data recorded  Have You Been Recently Discharged From Any Office Practice or Programs? No data recorded Explanation of Discharge From Practice/Program: No data recorded   CCA Screening Triage Referral Assessment Type of Contact: No data recorded Telemedicine Service Delivery:   Is this Initial or Reassessment?   Date Telepsych consult ordered in [facility]:    Time Telepsych consult ordered in [facility]:    Location of Assessment: No data recorded Provider Location: No data recorded   Collateral Involvement: No data recorded  Does Patient Have a Court Appointed Legal Guardian? No data recorded Name and Contact of Legal Guardian: No data recorded If Minor and Not Living with Parent(s), Who has Custody? No data recorded Is CPS involved or ever been involved? No data recorded Is APS involved or ever been involved? No data recorded  Patient Determined To Be At Risk for Harm To Self or Others Based on Review of Patient Reported Information or Presenting Complaint? No data recorded Method: No data recorded Availability of Means: No data recorded Intent: No data recorded Notification Required: No data recorded Additional Information for Danger to Others Potential: No data recorded Additional Comments for Danger to Others Potential: No data recorded Are There Guns or Other Weapons in Your Home? No data recorded Types of Guns/Weapons: No data recorded Are These Weapons Safely  Secured?                            No data recorded Who Could Verify You Are Able To Have These Secured: No data recorded Do You Have any Outstanding Charges, Pending Court Dates, Parole/Probation? No data  recorded Contacted To Inform of Risk of Harm To Self or Others: No data recorded  Does Patient Present under Involuntary Commitment? No data recorded   [place] of Residence: No data recorded  Patient Currently Receiving the Following Services: No data recorded  Determination of Need: No data recorded  Options For Referral: No data recorded  Disposition Recommendation per psychiatric provider: Pt is recommended for inpatient treatment.   Dava Rensch R Floria Brandau, LCAS

## 2023-09-29 NOTE — Consult Note (Signed)
 [facility] Psychiatric Consult Initial  Patient Name: .Anthony Knox  MRN: [id number]  DOB: July 06, 1961  Consult Order details:  Orders (From admission, onward)     Start     Ordered   09/29/23 1812  IP CONSULT TO PSYCHIATRY       Ordering Provider: Corena Herter, MD  Provider:  (Not yet assigned)  Question Answer Comment  Consult Timeframe URGENT - requires response within 12 hours   URGENT timeframe requires provider to provider communication, has the provider to provider communication been completed Yes   Reason for Consult? Consult for medication management   Contact phone number where the requesting provider can be reached [phone]      09/29/23 1811   09/29/23 1727  CONSULT TO CALL ACT TEAM       Ordering Provider: Corena Herter, MD  Provider:  (Not yet assigned)  Question:  Reason for Consult?  Answer:  IVC SI/HI   09/29/23 1728             Mode of Visit: Tele-visit Virtual Statement:TELE PSYCHIATRY ATTESTATION & CONSENT As the provider for this telehealth consult, I attest that I verified the patient's identity using two separate identifiers, introduced myself to the patient, provided my credentials, disclosed my location, and performed this encounter via a HIPAA-compliant, real-time, face-to-face, two-way, interactive audio and video platform and with the full consent and agreement of the patient (or guardian as applicable.) Patient physical location: [facility]. Telehealth provider physical location: home office in state of [place].   Video start time:   Video end time:      Psychiatry Consult Evaluation  Service Date: September 29, 2023 LOS:  LOS: 0 days  Chief Complaint Agitation  Primary Psychiatric Diagnoses  Schizophrenia 2.  Agitation  Assessment  Anthony Knox is a 63 y.o. male admitted: Presented to the [facility] 09/29/2023  5:41 PM for agitation. He carries the psychiatric diagnoses of schizophrenia.   Patient presents with severe  psychotic decompensation, characterized by bizarre behavior, agitation, and disorganized speech, consistent with an acute exacerbation of schizophrenia. The patient meets criteria for inpatient psychiatric admission due to:  Acute psychotic symptoms with disorganization Threatening behavior placing himself and others at risk Likely medication noncompliance contributing to decompensation  Diagnoses:  Active Hospital problems: Active Problems:   * No active hospital problems. *    Plan   ## Medical Decision Making Capacity: Patient has a guardian and has thus been adjudicated incompetent; please involve patients guardian in medical decision making   ## Disposition:-- We recommend inpatient psychiatric hospitalization after medical hospitalization. Patient has been involuntarily committed on 09/29/2023.   ## Behavioral / Environmental: - No specific recommendations at this time.     ## Safety and Observation Level:  - Based on my clinical evaluation, I estimate the patient to be at low risk of self harm in the current setting. - At this time, we recommend  routine. This decision is based on my review of the chart including patient's history and current presentation, interview of the patient, mental status examination, and consideration of suicide risk including evaluating suicidal ideation, plan, intent, suicidal or self-harm behaviors, risk factors, and protective factors. This judgment is based on our ability to directly address suicide risk, implement suicide prevention strategies, and develop a safety plan while the patient is in the clinical setting. Please contact our team if there is a concern that risk level has changed.  CSSR Risk Category:C-SSRS RISK CATEGORY: No Risk  Suicide Risk Assessment: Patient has following modifiable risk factors for suicide: recklessness and medication noncompliance, which we are addressing by an inpatient admission. Patient has following non-modifiable  or demographic risk factors for suicide: male gender Patient has the following protective factors against suicide: Supportive friends  Thank you for this consult request. Recommendations have been communicated to the primary team.  We will recommend inpatient at this time.   De Burrs, NP       History of Present Illness  Relevant Aspects of Hospital ED Course:  Admitted on 09/29/2023 for exacerbation of schizophrenia..   Patient Report:  Anthony Knox, a 63 year old male with a history of schizophrenia, was brought to the Emergency Department due to threatening and bizarre behavior at his group home. Staff reported that the patient had been defecating in drawers and smearing feces on the walls, exhibiting disorganized and aggressive behavior toward others. Given the escalating nature of his symptoms, emergency intervention was required.  The patient has a history of schizophrenia and has been receiving outpatient psychiatric care. However, his level of medication compliance is uncertain. He has no reported recent substance use.  Psych ROS:  Depression: Unknown Anxiety: Unknown Mania (lifetime and current): Yes Psychosis: (lifetime and current): Yes   Review of Systems  Constitutional: Negative.   HENT: Negative.    Eyes: Negative.   Respiratory: Negative.    Cardiovascular: Negative.   Gastrointestinal: Negative.   Genitourinary: Negative.   Musculoskeletal: Negative.   Neurological: Negative.      Psychiatric and Social History  Psychiatric History:   Prev Dx/Sx: Schizophrenia Current Psych Provider: Unknown Home Meds (current): Unknown Previous Med Trials: Unknown Therapy: Unknown  Social History:  Developmental Hx: Unknown Educational Hx: Unknown Occupational Hx: None Legal Hx: Unknown Living Situation: Patient resides at group home Spiritual Hx: Unknown Access to weapons/lethal means: Unknown   Exam Findings  Physical Exam:  Vital Signs:  Temp:   [98.3 F (36.8 C)-99.8 F (37.7 C)] 99.8 F (37.7 C) (03/18 1952) Pulse Rate:  [70-103] 70 (03/18 1952) Resp:  [18] 18 (03/18 1952) BP: (100-144)/(75-93) 100/75 (03/18 1952) SpO2:  [97 %-98 %] 97 % (03/18 1952) Blood pressure 100/75, pulse 70, temperature 99.8 F (37.7 C), temperature source Oral, resp. rate 18, SpO2 97%. There is no height or weight on file to calculate BMI.  Physical Exam HENT:     Head: Normocephalic.     Nose: Nose normal.     Mouth/Throat:     Pharynx: Oropharynx is clear.  Pulmonary:     Effort: Pulmonary effort is normal.  Musculoskeletal:        General: Normal range of motion.     Cervical back: Normal range of motion.  Skin:    General: Skin is dry.  Neurological:     Mental Status: He is alert.     Other History   These have been pulled in through the EMR, reviewed, and updated if appropriate.  Family History:  The patient's family history is not on file.  Medical History: Past Medical History:  Diagnosis Date   Schizophrenia [facility])     Surgical History: History reviewed. No pertinent surgical history.   Medications:  No current facility-administered medications for this encounter.  Current Outpatient Medications:    benztropine (COGENTIN) 0.5 MG tablet, Take 1 tablet (0.5 mg total) by mouth 2 (two) times daily in the am and at bedtime.. For prevention of drug induced tremors (Patient not taking: Reported on 11/28/2021), Disp: 60 tablet,  Rfl: 0   hydrOXYzine (ATARAX/VISTARIL) 25 MG tablet, Take 1 tablet (25 mg total) by mouth every 6 (six) hours as needed for anxiety (Sleep). (Patient not taking: Reported on 11/28/2021), Disp: 75 tablet, Rfl: 0   levothyroxine (SYNTHROID) 50 MCG tablet, Take 1 tablet (50 mcg total) by mouth daily at 6 (six) AM. For thyroid hormone replacement (Patient not taking: Reported on 11/28/2021), Disp: 15 tablet, Rfl: 0   paliperidone (INVEGA SUSTENNA) 156 MG/ML SUSY injection, Inject 1 mL (156 mg total) into the  muscle every 28 (twenty-eight) days. Next dose due 02/28/2021. (Patient not taking: Reported on 11/28/2021), Disp: 1.2 mL, Rfl: 0   polyethylene glycol (MIRALAX / GLYCOLAX) 17 g packet, Take 17 g by mouth daily. (Patient not taking: Reported on 11/28/2021), Disp: 14 each, Rfl: 0   prazosin (MINIPRESS) 2 MG capsule, Take 2 mg by mouth at bedtime., Disp: , Rfl:    ramelteon (ROZEREM) 8 MG tablet, Take 1 tablet (8 mg total) by mouth at bedtime. For sleep (Patient not taking: Reported on 11/28/2021), Disp: 15 tablet, Rfl: 0   risperiDONE (RISPERDAL) 2 MG tablet, Take 1 tablet (2 mg total) by mouth at bedtime. For mood control (Patient not taking: Reported on 11/28/2021), Disp: 30 tablet, Rfl: 0   senna (SENOKOT) 8.6 MG TABS tablet, Take 1 tablet (8.6 mg total) by mouth daily. (May buy from over the counter): For constipation (Patient not taking: Reported on 11/28/2021), Disp: 1 tablet, Rfl: 0   valproic acid (DEPAKENE) 250 MG/5ML solution, Take by mouth See admin instructions. Give 15 ml by mouth twice daily, Disp: , Rfl:   Allergies: Allergies  Allergen Reactions   Geodon [Ziprasidone Hcl] Other (See Comments)    Arrythmia    De Burrs, NP

## 2023-09-30 NOTE — ED Notes (Signed)
 Pt refusing to provide urine sample or metabolic panel that was ordered last shift. Pt becoming agitated with asking to provide sample. MD Tan aware.

## 2023-09-30 NOTE — ED Notes (Signed)
Refused CBG check 

## 2023-09-30 NOTE — ED Notes (Signed)
Refused shower. 

## 2023-09-30 NOTE — ED Notes (Signed)
 IVC/ rec inpt psych treatment

## 2023-09-30 NOTE — ED Provider Notes (Signed)
 Emergency Medicine Observation Re-evaluation Note  Anthony Knox is a 63 y.o. male, seen on rounds today.  Pt initially presented to the ED for complaints of IVC Currently, the patient is resting, voices no medical complaints.  Physical Exam  BP 100/75 (BP Location: Left Arm)   Pulse 70   Temp 99.8 F (37.7 C) (Oral)   Resp 18   SpO2 97%  Physical Exam General: Resting in no acute distress Cardiac: No cyanosis Lungs: Equal rise and fall Psych: Not agitated  ED Course / MDM  EKG:   I have reviewed the labs performed to date as well as medications administered while in observation.  Recent changes in the last 24 hours include no events overnight.  Plan  Current plan is for psychiatric disposition.    Irean Hong, MD 09/30/23 [phone]

## 2023-09-30 NOTE — ED Notes (Signed)
Pt given PM snack at this time.  

## 2023-09-30 NOTE — ED Notes (Addendum)
 Pt refused to allow staff to check BG level. No signs of hypoglycemia.

## 2023-09-30 NOTE — ED Notes (Signed)
 IVC/  PENDING  PLACEMENT

## 2023-09-30 NOTE — ED Notes (Signed)
 Pt refused to transfer to new bed spot.

## 2023-09-30 NOTE — BH Assessment (Signed)
 Per [facility] Nehemiah Settle), patient to be referred out of system.  Referral information for Psychiatric Hospitalization faxed to:   [facility] [phone])  [facility] [phone]),   [facility] [phone])   [facility] [phone] phone--[phone])  [facility] [phone]  [place] [facility] [phone]),   [facility] [phone] -or- [phone]),   [facility] [phone]),  [place] [phone])  [facility] [phone])

## 2023-09-30 NOTE — ED Notes (Signed)
 IVC pending placement

## 2023-09-30 NOTE — ED Notes (Signed)
 Pt provided with dinner tray.

## 2023-10-01 LAB — BASIC METABOLIC PANEL
Anion gap: 9 (ref 5–15)
BUN: 12 mg/dL (ref 6–20)
CO2: 19 mmol/L — ABNORMAL LOW (ref 22–32)
Calcium: 9 mg/dL (ref 8.9–10.3)
Chloride: 112 mmol/L — ABNORMAL HIGH (ref 98–111)
Creatinine, Ser: 0.79 mg/dL (ref 0.61–1.24)
GFR, Estimated: >60 mL/min (ref >60)
Glucose, Bld: 114 mg/dL — ABNORMAL HIGH (ref 70–99)
Potassium: 3.4 mmol/L — ABNORMAL LOW (ref 3.5–5.1)
Sodium: 140 mmol/L (ref 135–145)

## 2023-10-01 LAB — CBG MONITORING, ED: Glucose-Capillary: 93 mg/dL (ref 70–99)

## 2023-10-01 MED ORDER — VALPROIC ACID 250 MG/5ML PO SOLN
250.0000 mg | Freq: Two times a day (BID) | ORAL | Status: DC
  Administered 2023-10-03 (×2): 250 mg via ORAL
  Filled 2023-10-01 (×5): qty 5

## 2023-10-01 MED ORDER — PRAZOSIN HCL 1 MG PO CAPS
2.0000 mg | ORAL_CAPSULE | Freq: Every day | ORAL | Status: DC
  Administered 2023-10-03: 2 mg via ORAL
  Filled 2023-10-01 (×2): qty 2

## 2023-10-01 MED ORDER — VALPROIC ACID 250 MG/5ML PO SOLN
250.0000 mg | Freq: Two times a day (BID) | ORAL | Status: DC
  Filled 2023-10-01: qty 5

## 2023-10-01 NOTE — ED Notes (Signed)
 Pt not agreeable to VS at this time.

## 2023-10-01 NOTE — ED Notes (Addendum)
 Pt complaining of hair in blanket and demanded that this RN "bathe in the hair and apologize". I declined and offered to get the pt a new blanket. Pt stated, " I'll take one from anyone but you!" RN acknowledged pts request. At this time, there are no available personnel to retrieve blanket for pt. Pt refusing to allow this RN to obtain BMP panel. RN will attempt to find secondary RN to try when available.

## 2023-10-01 NOTE — ED Notes (Signed)
 Patient REFUSED HIS LUNCH.

## 2023-10-01 NOTE — ED Notes (Addendum)
 Pt moved to [facility] via wheelchair with ED tech and security

## 2023-10-01 NOTE — ED Notes (Signed)
 Staff attempted to obtain an EKG, pt did not want to comply, was becoming visibly upset was unable to articulate thoughts.  Pt making statements "your being disobedient" staff exited room to not upset pt any further.

## 2023-10-01 NOTE — ED Notes (Signed)
 Attempted to obtain CBG, pt refused.  Stated "you're not on the [place], you can't perform in those shoes"  Staff did not want to upset pt further, there are no s/s or indication of hypoglycemia.  Cont to closely monitor

## 2023-10-01 NOTE — ED Provider Notes (Signed)
 Emergency Medicine Observation Re-evaluation Note  Anthony Knox is a 63 y.o. male, seen on rounds today.  Pt initially presented to the ED for complaints of IVC Currently, the patient is resting, voices no medical complaints.  Physical Exam  BP 114/70 (BP Location: Right Arm)   Pulse 78   Temp 98.4 F (36.9 C) (Oral)   Resp 18   SpO2 99%  Physical Exam General: Resting in no acute distress Cardiac: No cyanosis Lungs: Equal rise and fall Psych: Not agitated  ED Course / MDM  EKG:   I have reviewed the labs performed to date as well as medications administered while in observation.  Recent changes in the last 24 hours include no events overnight.  Plan  Current plan is for psychiatric disposition.    Irean Hong, MD 10/01/23 [phone]

## 2023-10-01 NOTE — ED Notes (Signed)
 IVC/  PENDING  PLACEMENT

## 2023-10-01 NOTE — BH Assessment (Signed)
  Referral information for Psychiatric Hospitalization re-faxed to;   Service Provider Phone  [facility] [phone]  [facility] [phone]  [facility] [phone]  [facility] [phone]  [facility] [phone]  [facility] [phone]  [facility] [phone]  [facility] [phone]  [facility] [phone]  [facility] [phone]  [facility] [phone]  [facility] [phone]  [facility] [phone]

## 2023-10-01 NOTE — ED Notes (Signed)
 IVC/Pending Placement

## 2023-10-01 NOTE — ED Notes (Signed)
 Hospital meal provided, pt tolerated w/o complaints.  Waste discarded appropriately.

## 2023-10-01 NOTE — ED Notes (Signed)
 Delivered pt breakfast tray, pt making disorganized speech "you can put some petroleum jelly on there, lets spice it up.  Got to be brave from [place].  Pt did allow vitals to be taken however continues to refuse CBG

## 2023-10-02 LAB — CBG MONITORING, ED
Glucose-Capillary: 110 mg/dL — ABNORMAL HIGH (ref 70–99)
Glucose-Capillary: 105 mg/dL — ABNORMAL HIGH (ref 70–99)
Glucose-Capillary: 161 mg/dL — ABNORMAL HIGH (ref 70–99)
Glucose-Capillary: 97 mg/dL (ref 70–99)

## 2023-10-02 NOTE — ED Notes (Signed)
Patient received dinner tray at this time.  

## 2023-10-02 NOTE — ED Notes (Signed)
 IVC/Pending Placement

## 2023-10-02 NOTE — ED Notes (Signed)
 IVC/Pending placement

## 2023-10-02 NOTE — ED Notes (Signed)
 Pt. Refused shower at this time.

## 2023-10-02 NOTE — ED Provider Notes (Signed)
 Emergency Medicine Observation Re-evaluation Note  Anthony Knox is a 63 y.o. male, seen on rounds today.  Pt initially presented to the ED for complaints of IVC  Currently, the patient is no acute distress. No issues per bhu nurse   Physical Exam  Blood pressure 102/84, pulse 72, temperature 99.1 F (37.3 C), temperature source Oral, resp. rate 18, SpO2 96%.  Physical Exam General: No apparent distress Pulm: Normal WOB Psych: resting     ED Course / MDM     I have reviewed the labs performed to date as well as medications administered while in observation.  Recent changes in the last 24 hours include none   Plan   Current plan is to continue to wait for psych placement  Patient is not under full IVC at this time.   Concha Se, MD 10/02/23 [phone]

## 2023-10-02 NOTE — BH Assessment (Signed)
 TTS continue to look for inpatient bed but unable to secure one at this time

## 2023-10-02 NOTE — ED Notes (Signed)
 Pt. refuesd meal at this time, will try to offer again.

## 2023-10-02 NOTE — ED Notes (Signed)
Pt received snack and water. 

## 2023-10-03 ENCOUNTER — Inpatient Hospital Stay (HOSPITAL_COMMUNITY)
Admission: AD | Admit: 2023-10-03 | Discharge: 2023-10-21 | DRG: 885 | Source: Intra-hospital | Attending: Psychiatry | Admitting: Psychiatry

## 2023-10-03 DIAGNOSIS — F250 Schizoaffective disorder, bipolar type: Principal | ICD-10-CM | POA: Diagnosis present

## 2023-10-03 DIAGNOSIS — F201 Disorganized schizophrenia: Secondary | ICD-10-CM | POA: Diagnosis not present

## 2023-10-03 DIAGNOSIS — F259 Schizoaffective disorder, unspecified: Secondary | ICD-10-CM

## 2023-10-03 DIAGNOSIS — F29 Unspecified psychosis not due to a substance or known physiological condition: Secondary | ICD-10-CM | POA: Diagnosis present

## 2023-10-03 DIAGNOSIS — G4700 Insomnia, unspecified: Secondary | ICD-10-CM | POA: Diagnosis present

## 2023-10-03 DIAGNOSIS — Z91148 Patient's other noncompliance with medication regimen for other reason: Secondary | ICD-10-CM

## 2023-10-03 DIAGNOSIS — Z79890 Hormone replacement therapy (postmenopausal): Secondary | ICD-10-CM

## 2023-10-03 DIAGNOSIS — K30 Functional dyspepsia: Secondary | ICD-10-CM | POA: Diagnosis present

## 2023-10-03 DIAGNOSIS — Z79899 Other long term (current) drug therapy: Secondary | ICD-10-CM | POA: Diagnosis not present

## 2023-10-03 DIAGNOSIS — K5900 Constipation, unspecified: Secondary | ICD-10-CM | POA: Diagnosis present

## 2023-10-03 DIAGNOSIS — Z87891 Personal history of nicotine dependence: Secondary | ICD-10-CM

## 2023-10-03 DIAGNOSIS — R451 Restlessness and agitation: Secondary | ICD-10-CM | POA: Diagnosis present

## 2023-10-03 DIAGNOSIS — F209 Schizophrenia, unspecified: Principal | ICD-10-CM | POA: Diagnosis present

## 2023-10-03 LAB — T4, FREE: Free T4: 0.97 ng/dL (ref 0.61–1.12)

## 2023-10-03 LAB — TSH: TSH: 4.287 u[IU]/mL (ref 0.350–4.500)

## 2023-10-03 MED ORDER — DIPHENHYDRAMINE HCL 50 MG/ML IJ SOLN: 50.0000 mg | Freq: Three times a day (TID) | INTRAMUSCULAR | Status: DC | PRN

## 2023-10-03 MED ORDER — RISPERIDONE 0.25 MG PO TABS
0.5000 mg | ORAL_TABLET | Freq: Two times a day (BID) | ORAL | Status: DC
  Administered 2023-10-03 (×2): 0.5 mg via ORAL
  Filled 2023-10-03 (×2): qty 2

## 2023-10-03 MED ORDER — ACETAMINOPHEN 325 MG PO TABS: 650.0000 mg | ORAL_TABLET | Freq: Four times a day (QID) | ORAL | Status: DC | PRN

## 2023-10-03 MED ORDER — PRAZOSIN HCL 2 MG PO CAPS
2.0000 mg | ORAL_CAPSULE | Freq: Every day | ORAL | Status: DC
  Filled 2023-10-03 (×2): qty 1

## 2023-10-03 MED ORDER — LORAZEPAM 2 MG/ML IJ SOLN: 2.0000 mg | Freq: Three times a day (TID) | INTRAMUSCULAR | Status: DC | PRN

## 2023-10-03 MED ORDER — HALOPERIDOL 5 MG PO TABS
5.0000 mg | ORAL_TABLET | Freq: Three times a day (TID) | ORAL | Status: DC | PRN
  Filled 2023-10-03: qty 1

## 2023-10-03 MED ORDER — ALUM & MAG HYDROXIDE-SIMETH 200-200-20 MG/5ML PO SUSP
30.0000 mL | ORAL | Status: DC | PRN
  Administered 2023-10-14 – 2023-10-15 (×2): 30 mL via ORAL
  Filled 2023-10-03 (×2): qty 30

## 2023-10-03 MED ORDER — VALPROIC ACID 250 MG/5ML PO SOLN
250.0000 mg | Freq: Two times a day (BID) | ORAL | Status: DC
  Administered 2023-10-04 – 2023-10-07 (×7): 250 mg via ORAL
  Filled 2023-10-03 (×11): qty 5

## 2023-10-03 MED ORDER — HALOPERIDOL LACTATE 5 MG/ML IJ SOLN: 10.0000 mg | Freq: Three times a day (TID) | INTRAMUSCULAR | Status: DC | PRN

## 2023-10-03 MED ORDER — DIPHENHYDRAMINE HCL 25 MG PO CAPS
50.0000 mg | ORAL_CAPSULE | Freq: Three times a day (TID) | ORAL | Status: DC | PRN
  Filled 2023-10-03: qty 2

## 2023-10-03 MED ORDER — MAGNESIUM HYDROXIDE 400 MG/5ML PO SUSP: 30.0000 mL | Freq: Every day | ORAL | Status: DC | PRN

## 2023-10-03 MED ORDER — RISPERIDONE 0.5 MG PO TABS
0.5000 mg | ORAL_TABLET | Freq: Two times a day (BID) | ORAL | Status: DC
  Administered 2023-10-04: 0.5 mg via ORAL
  Filled 2023-10-03 (×4): qty 1

## 2023-10-03 MED ORDER — HALOPERIDOL LACTATE 5 MG/ML IJ SOLN: 5.0000 mg | Freq: Three times a day (TID) | INTRAMUSCULAR | Status: DC | PRN

## 2023-10-03 NOTE — ED Provider Notes (Signed)
 Vitals:   10/03/23 0915 10/03/23 1951  BP: 139/88 122/84  Pulse: 95 80  Resp: 16 18  Temp: 98.7 F (37.1 C) 98 F (36.7 C)  SpO2: 97% 98%   Patient has been accepted in transfer by psychiatry.  He is alert, resting comfortably very pleasant in no distress seated at the side of the bed.  Stable for transfer   Sharyn Creamer, MD 10/03/23 2154

## 2023-10-03 NOTE — ED Notes (Signed)
 Report called to [facility] in [place]. Report given to RN, Clarisse Gouge.

## 2023-10-03 NOTE — ED Notes (Signed)
 Patient refused lunch at this time and beverages.

## 2023-10-03 NOTE — Consult Note (Signed)
 [facility] Psychiatric Consult Follow-up  Patient Name: .Anthony Knox  MRN: [id number]  DOB: January 05, 1961  Consult Order details:  Orders (From admission, onward)     Start     Ordered   09/29/23 1812  IP CONSULT TO PSYCHIATRY       Ordering Provider: Corena Herter, MD  Provider:  (Not yet assigned)  Question Answer Comment  Consult Timeframe URGENT - requires response within 12 hours   URGENT timeframe requires provider to provider communication, has the provider to provider communication been completed Yes   Reason for Consult? Consult for medication management   Contact phone number where the requesting provider can be reached [phone]      09/29/23 1811   09/29/23 1727  CONSULT TO CALL ACT TEAM       Ordering Provider: Corena Herter, MD  Provider:  (Not yet assigned)  Question:  Reason for Consult?  Answer:  IVC SI/HI   09/29/23 1728             Mode of Visit: Tele-visit Virtual Statement:TELE PSYCHIATRY ATTESTATION & CONSENT As the provider for this telehealth consult, I attest that I verified the patient's identity using two separate identifiers, introduced myself to the patient, provided my credentials, disclosed my location, and performed this encounter via a HIPAA-compliant, real-time, face-to-face, two-way, interactive audio and video platform and with the full consent and agreement of the patient (or guardian as applicable.) Patient physical location: [facility] ED. Telehealth provider physical location: home office in state of [place].   Video start time: 1017 Video end time: 1038    Psychiatry Consult Evaluation  Service Date: October 03, 2023 LOS:  LOS: 0 days  Chief Complaint "BF4"  Primary Psychiatric Diagnoses  Schizophrenia 2.  Agitation  Assessment  Anthony Knox is a 63 y.o. male admitted: Presented to the [facility] 09/29/2023  5:41 PM for agitation. He carries the psychiatric diagnoses of schizophrenia.   On 09/29/2023, Anthony Knox  presented to the emergency department, escorted by law enforcement for evaluation of psychosis.  Patient has a hx for schizophrenia and lives at [facility].  The group home staff had him IVCd for bizarre behaviors, agitation, threatening to harm himself and staff members.  He was initially evaluated by psychiatry on 09/29/2023 and referred for inpatient psychiatric admission.  [facility] did not have an appropriate bed so he was faxed out.  Since that time, his home medications were restarted on home medications and he's remained in the emergency department where he's monitored for safety and mood stability while awaiting placement.    Patient was re-evaluated by psychiatry today.  He is alert but unable to state his name or engage in meaningful communication. He remains confused and disorganized, makes non-sensical statement and at time engages in echolalia. He takes depakote and prazosin, per nursing notes he's been medication compliant, eats his meals and sleeping well. However, there's no significant improvement in his psychosis. He has not had any agitation or behavioral concerns warranting IM meds.  Appears his lack of insurance is a barrier to placement.  [facility] reviewed him at the time of admission but did not have appropriate bed availability.  Will ask the [facility] if she can re-consider for admission today.    The patient is an acute safety concern for self and others if discharged today. His behaviors continue to justify the need for psychiatric admission where he can be monitored for safety and mood stability.   Diagnoses:  Active Hospital problems: Principal Problem:   Schizoaffective disorder (HCC)    Plan  ## Psychiatric Medication Recommendations:  He will continue: Depakote 250mg  po BID for mood stability Prazosin 2mg  po at bedtime for PTSD  Start: Risperidone 0.5mg  po BID for schizophrenia Ativan 1mg  po TID prn agitation  ## Medical Decision Making Capacity: Patient has a guardian  and has thus been adjudicated incompetent; please involve patients guardian in medical decision making  ## Further Work-up:  -- Pt has hx of afib, should have updated EKG; will defer to ED provider for mgmt. EKG or While pt on Qtc prolonging medications, please monitor & replete K+ to 4 and Mg2+ to 2 -- most recent EKG on 11/28/2021 had QtC of 463 NSR with PVCs -- Pertinent labwork reviewed earlier this admission includes: CMP,CBC, UDS. Patient was medically cleared at the time of psych reassessment.  ## Disposition:-- We recommend inpatient psychiatric hospitalization after medical hospitalization. Patient has been involuntarily committed on 09/29/2023.  Asked [facility], to review patient for possible admission.  ## Behavioral / Environmental: - No specific recommendations at this time.  or Utilize compassion and acknowledge the patient's experiences while setting clear and realistic expectations for care.    ## Safety and Observation Level:  - Based on my clinical evaluation, I estimate the patient to be at low risk of self harm in the current setting. - At this time, we recommend  routine. This decision is based on my review of the chart including patient's history and current presentation, interview of the patient, mental status examination, and consideration of suicide risk including evaluating suicidal ideation, plan, intent, suicidal or self-harm behaviors, risk factors, and protective factors. This judgment is based on our ability to directly address suicide risk, implement suicide prevention strategies, and develop a safety plan while the patient is in the clinical setting. Please contact our team if there is a concern that risk level has changed.  CSSR Risk Category:C-SSRS RISK CATEGORY: No Risk Deferred as pt is unable or unwilling to answer questions  Suicide Risk Assessment: Patient has following modifiable risk factors for suicide: recklessness and medication noncompliance, which we are  addressing by an inpatient admission. Patient has following non-modifiable or demographic risk factors for suicide: male gender Patient has the following protective factors against suicide: Access to outpatient mental health care and Supportive friends  Thank you for this consult request. Recommendations have been communicated to the primary team.  We will recommend inpatient at this time.   Anthony Abrahams, NP       History of Present Illness  Relevant Aspects of Hospital ED Course:  Admitted on 09/29/2023 for exacerbation of schizophrenia.  Patient Report:  Patient greeted by Clinical research associate and given anticipatory guidance. When asked his name, patient repeats this provider's name several times.  He is alert, and makes eye contact.  He is very disorganized, confused and offers tangential responses to questions.  He is cooperative and attempts to answer questions but appears limited by his mental health decline.    When asked if anything is hurting him, he begins to talk about the "cat walls."  Patient is unable to elaborate but no apparent distress observed.   Psych ROS:  Depression: Unknown Anxiety: Unknown Mania (lifetime and current): Yes Psychosis: (lifetime and current): Yes, current   Review of Systems  Constitutional: Negative.   HENT: Negative.    Eyes: Negative.   Respiratory: Negative.    Cardiovascular: Negative.   Gastrointestinal: Negative.   Genitourinary:  Negative.   Musculoskeletal: Negative.   Neurological: Negative.   Psychiatric/Behavioral:  Positive for memory loss (d/t to mental decline). The patient is nervous/anxious.      Psychiatric and Social History  Psychiatric History:   Prev Dx/Sx: Schizophrenia Current Psych Provider: Unknown Home Meds (current): Unknown Previous Med Trials: Unknown Therapy: Unknown  Social History:  Developmental Hx: Unknown Educational Hx: Unknown Occupational Hx: None Legal Hx: Unknown Living Situation: Patient resides at  group home Spiritual Hx: Unknown Access to weapons/lethal means: Unknown   Exam Findings  Physical Exam:  Vital Signs:  Temp:  [98.7 F (37.1 C)-98.8 F (37.1 C)] 98.7 F (37.1 C) (03/22 0915) Pulse Rate:  [73-95] 95 (03/22 0915) Resp:  [16-17] 16 (03/22 0915) BP: (113-139)/(73-88) 139/88 (03/22 0915) SpO2:  [97 %] 97 % (03/22 0915) Blood pressure 139/88, pulse 95, temperature 98.7 F (37.1 C), temperature source Oral, resp. rate 16, SpO2 97%. There is no height or weight on file to calculate BMI.  Physical Exam HENT:     Head: Normocephalic.     Nose: Nose normal.     Mouth/Throat:     Pharynx: Oropharynx is clear.  Pulmonary:     Effort: Pulmonary effort is normal.  Musculoskeletal:        General: Normal range of motion.     Cervical back: Normal range of motion.  Skin:    General: Skin is dry.  Neurological:     Mental Status: He is alert. He is disoriented.  Psychiatric:        Attention and Perception: Attention and perception normal.        Mood and Affect: Mood is anxious. Affect is blunt.        Speech: Speech is tangential.        Behavior: Behavior is cooperative.        Thought Content: Thought content is delusional.        Cognition and Memory: Cognition is impaired (d/t mental decline). Memory is impaired.        Judgment: Judgment is impulsive and inappropriate.   Physical Exam  Mental Status Exam: General Appearance: Bizarre and Fairly Groomed  Orientation:  NA  Memory:  Immediate;   Poor Recent;   Poor Remote;   Poor  Concentration:  Concentration: Poor and Attention Span: Poor  Recall:  Poor  Attention  Fair  Eye Contact:  Good  Speech:  Normal Rate  Language:  Good  Volume:  Normal  Mood: pt unable or unwilling to state his mood  Affect:  Blunted  Thought Process:  Disorganized and Irrelevant  Thought Content:  Illogical and Tangential  Suicidal Thoughts:  unable to determine  Homicidal Thoughts:  unable to determine  Judgement:  Poor   Insight:  Lacking  Psychomotor Activity:  Normal  Akathisia:  No  Fund of Knowledge:  Poor      Assets:  Communication Skills Housing  Cognition:  Impaired,  Moderate  ADL's:  Intact  AIMS (if indicated):       Other History   These have been pulled in through the EMR, reviewed, and updated if appropriate.  Family History:  The patient's family history is not on file.  Medical History: Past Medical History:  Diagnosis Date   Schizophrenia [facility])     Surgical History: History reviewed. No pertinent surgical history.   Medications:   Current Facility-Administered Medications:    prazosin (MINIPRESS) capsule 2 mg, 2 mg, Oral, QHS, Anthony Abrahams, NP  risperiDONE (RISPERDAL) tablet 0.5 mg, 0.5 mg, Oral, BID, Ophelia Shoulder E, NP, 0.5 mg at 10/03/23 1208   valproic acid (DEPAKENE) 250 MG/5ML solution 250 mg, 250 mg, Oral, BID, Otelia Sergeant, RPH, 250 mg at 10/03/23 0908  Current Outpatient Medications:    benztropine (COGENTIN) 0.5 MG tablet, Take 1 tablet (0.5 mg total) by mouth 2 (two) times daily in the am and at bedtime.. For prevention of drug induced tremors (Patient not taking: Reported on 11/28/2021), Disp: 60 tablet, Rfl: 0   hydrOXYzine (ATARAX/VISTARIL) 25 MG tablet, Take 1 tablet (25 mg total) by mouth every 6 (six) hours as needed for anxiety (Sleep). (Patient not taking: Reported on 11/28/2021), Disp: 75 tablet, Rfl: 0   levothyroxine (SYNTHROID) 50 MCG tablet, Take 1 tablet (50 mcg total) by mouth daily at 6 (six) AM. For thyroid hormone replacement (Patient not taking: Reported on 11/28/2021), Disp: 15 tablet, Rfl: 0   paliperidone (INVEGA SUSTENNA) 156 MG/ML SUSY injection, Inject 1 mL (156 mg total) into the muscle every 28 (twenty-eight) days. Next dose due 02/28/2021. (Patient not taking: Reported on 11/28/2021), Disp: 1.2 mL, Rfl: 0   polyethylene glycol (MIRALAX / GLYCOLAX) 17 g packet, Take 17 g by mouth daily. (Patient not taking: Reported on 11/28/2021),  Disp: 14 each, Rfl: 0   prazosin (MINIPRESS) 2 MG capsule, Take 2 mg by mouth at bedtime. (Patient not taking: Reported on 09/30/2023), Disp: , Rfl:    ramelteon (ROZEREM) 8 MG tablet, Take 1 tablet (8 mg total) by mouth at bedtime. For sleep (Patient not taking: Reported on 11/28/2021), Disp: 15 tablet, Rfl: 0   risperiDONE (RISPERDAL) 2 MG tablet, Take 1 tablet (2 mg total) by mouth at bedtime. For mood control (Patient not taking: Reported on 11/28/2021), Disp: 30 tablet, Rfl: 0   senna (SENOKOT) 8.6 MG TABS tablet, Take 1 tablet (8.6 mg total) by mouth daily. (May buy from over the counter): For constipation (Patient not taking: Reported on 11/28/2021), Disp: 1 tablet, Rfl: 0   valproic acid (DEPAKENE) 250 MG/5ML solution, Take by mouth See admin instructions. Give 15 ml by mouth twice daily (Patient not taking: Reported on 09/30/2023), Disp: , Rfl:   Allergies: Allergies  Allergen Reactions   Geodon [Ziprasidone Hcl] Other (See Comments)    Arrythmia    Anthony Abrahams, NP

## 2023-10-03 NOTE — ED Notes (Signed)
Pt was provided with pm snack.

## 2023-10-03 NOTE — ED Notes (Signed)
PT IVC/ PENDING PLACEMENT  

## 2023-10-03 NOTE — ED Provider Notes (Signed)
 Emergency Medicine Observation Re-evaluation Note  Anthony Knox is a 63 y.o. male, seen on rounds today.  Pt initially presented to the ED for complaints of IVC  Currently, the patient is calm, no acute complaints.  Physical Exam  Blood pressure 139/88, pulse 95, temperature 98.7 F (37.1 C), temperature source Oral, resp. rate 16, SpO2 97%. Physical Exam General: NAD Lungs: CTAB Psych: not agitated  ED Course / MDM  EKG:    I have reviewed the labs performed to date as well as medications administered while in observation.  Recent changes in the last 24 hours include no acute events overnight.    Plan  Current plan is for psych placement. Patient is under full IVC at this time.   Sharman Cheek, MD 10/03/23 [phone]

## 2023-10-03 NOTE — BH Assessment (Signed)
 Writer spoke with the patient to complete an updated/reassessment. Patient is tangential, disorganized and the answers did not relate to the questions.

## 2023-10-03 NOTE — ED Notes (Signed)
 Breakfast tray provided for pt.

## 2023-10-03 NOTE — Consult Note (Signed)
 Spoke with patient's brother, Anthony Knox, who reports he is the legal guardian.  He's given patient update, and treatment plan reviewed.  In discussing discharge care, he is unsure of patient group home status.  He states he's made a few unsuccessful attempts to reach them and provides the following # [facility] [phone].   Of note, this writer also attempted to reach the group home without success.

## 2023-10-03 NOTE — ED Notes (Signed)
 IVC/  PENDING  PLACEMENT

## 2023-10-03 NOTE — BH Assessment (Signed)
 PATIENT BED AVAILABLE AT 9PM ON 10/03/23  Patient has been accepted to [facility]  Patient assigned to room 508 bed 1 Accepting physician is Dr. Sarita Bottom.  Call report to [phone].  Representative was [facility].   ER Staff is aware of it:  Carlene ER Secretary  Dr. Fanny Bien, ER MD  Cala Bradford Patient's Nurse

## 2023-10-03 NOTE — ED Notes (Signed)
 Staff was able to complete EKG order, pt continues to refuse CBG checks, Pt showing no s/s of hypo/hyer-glycemia cont to monitor closely

## 2023-10-04 ENCOUNTER — Encounter (HOSPITAL_COMMUNITY): Payer: Self-pay | Admitting: Adult Health

## 2023-10-04 ENCOUNTER — Other Ambulatory Visit: Payer: Self-pay

## 2023-10-04 LAB — GLUCOSE, CAPILLARY: Glucose-Capillary: 77 mg/dL (ref 70–99)

## 2023-10-04 MED ORDER — RISPERIDONE 1 MG PO TABS
1.0000 mg | ORAL_TABLET | Freq: Two times a day (BID) | ORAL | Status: DC
  Filled 2023-10-04 (×4): qty 1

## 2023-10-04 MED ORDER — TRAZODONE HCL 50 MG PO TABS
50.0000 mg | ORAL_TABLET | Freq: Every evening | ORAL | Status: DC | PRN
  Administered 2023-10-04: 50 mg via ORAL
  Filled 2023-10-04: qty 1

## 2023-10-04 MED ORDER — RISPERIDONE 1 MG PO TABS
1.0000 mg | ORAL_TABLET | Freq: Two times a day (BID) | ORAL | Status: DC
  Administered 2023-10-04 – 2023-10-05 (×2): 1 mg via ORAL
  Filled 2023-10-04 (×4): qty 1

## 2023-10-04 MED ORDER — HYDROXYZINE HCL 25 MG PO TABS
25.0000 mg | ORAL_TABLET | Freq: Three times a day (TID) | ORAL | Status: DC | PRN
  Administered 2023-10-04 – 2023-10-20 (×12): 25 mg via ORAL
  Filled 2023-10-04 (×13): qty 1

## 2023-10-04 NOTE — Plan of Care (Signed)
   Problem: Education: Goal: Emotional status will improve Outcome: Progressing   Problem: Coping: Goal: Ability to verbalize frustrations and anger appropriately will improve Outcome: Progressing

## 2023-10-04 NOTE — Progress Notes (Signed)
 Patient yelled at male MHT when presented with dinner. When male RN presented dinner patient was calm and cooperative; after several prompts and much encouragement patient ate 100% of dinner. When asked why he yelled at MHT, patient stated 'the fountain isn't working' Patient verbalizes numerous nonsensical statements. No physical aggressive behaviors observed or reported.

## 2023-10-04 NOTE — Plan of Care (Signed)
  Problem: Education: Goal: Mental status will improve Outcome: Progressing   Problem: Activity: Goal: Sleeping patterns will improve Outcome: Progressing   

## 2023-10-04 NOTE — Group Note (Signed)
 Date:  10/04/2023 Time:  9:08 PM  Group Topic/Focus:  Wrap-Up Group:   The focus of this group is to help patients review their daily goal of treatment and discuss progress on daily workbooks.    Participation Level:  Active  Participation Quality:  Appropriate  Affect:  Appropriate  Cognitive:  Appropriate  Insight: Appropriate  Engagement in Group:  Engaged  Modes of Intervention:  Education and Exploration  Additional Comments:  Patient attended and participated in group tonight He reports that his color is Purple. He reports that his day was uneventful.,until he received a phone call  Scot Dock 10/04/2023, 9:08 PM

## 2023-10-04 NOTE — Progress Notes (Signed)
 Admission Note:  63 yr male who presents IVC in no acute distress for the treatment of SI and Psychosis. Pt has a history of Schizoaffective disorder, bipolar type. Pt is alert, but unable to state his name or engage in any meaningful communication. He appears disorganized, paranoid, and makes non-sensical statements. Pt was agitated and paranoid during the admission process. Was unable to get the patient to sign any papers or answer many questions. Skin was assessed and found to be clear of any abnormal marks. PT searched and no contraband found. Food and fluids offered, and they were declined. Pt was taken to his room. He is on Q 15 minute checks for safety. Safety is being maintained.

## 2023-10-04 NOTE — [facility] Suicide Risk Assessment (Signed)
 Suicide Risk Assessment  Admission Assessment    [facility] Admission Suicide Risk Assessment   Nursing information obtained from:  Patient, Review of record Demographic factors:  Male, Caucasian Current Mental Status:  Suicidal ideation indicated by patient Loss Factors:  NA Historical Factors:  NA (Pt was a poor historian with nonsensical speech.) Risk Reduction Factors:  NA (Unable to assess.)  Principal Problem: Schizophrenia (HCC) Diagnosis:  Principal Problem:   Schizophrenia (HCC)   Subjective Data:   Anthony Knox is a 63 y.o., male with a past psychiatric history significant for schizoaffective disorder, bipolar type who presents to the [facility] from [facility] for evaluation and management of agitation in his group home and psychosis.    Initial assessment on 3/23, patient was evaluated on the inpatient unit. Patient is able to state his full name but otherwise, he did not appropriately answer his age, the current date, place, and situation. For his age, he answers "non relevant-how many teeth you have". When I asked him where he lives, he states "I forgot- by myself with others". When I asked if he has family around, he states "I dont know-what family?". He states somebody in the RX owns his life. When I asked him what was occurring in the group home, he states "stuff was coming out of the TV, weird lights, celestials". He states "I am against my family. They are out to prove that they are better than me. The good people are gone and the ones left are bad". When I asked him who are the good people, he states "the ones that died young". When I asked who, he says "[name] the elf and [name]". When I asked if he was taking medications prior to admission, he states that he was on a tranquilizer to calm his nerves. He states "An injection for subtraction. One side is for and one side is against". He deneis SI and HI. When asked about AVH, he looks around and says  "above is the moon and below is the grass". I discussed that we will be putting him on medication to help with his thoughts and he was agreeable.   Psychiatric medications prior to admission: unclear   Collateral information obtained from Delrae Sawyers, patient's brother (phone number [phone] ) He states that he does not know of what brought the patient in recently. He states that he last saw his brother many months ago and whenever he would visit, patient would be upset because patient thinks that the brother is stealing from him. He believes the patient was diagnosed with schizophrenia 25 years ago and their mother was living with him all his life in which she would help him stay compliant with his medications. His mother died in 14-Oct-2006 and he feels that is when the patient really declined. Patient was living alone until 2 years ago when he went to a group home. He went to [facility] after some time and was there for 8 months. He believes the patient was on Invega which was helpful. Previously, patient would have psychotic episodes of feeling paranoia, agitated, and impulsive but he does not have a good timeline of how patient acts recently regarding "baseline". He's tried to call the group home that he was staying at but they have not been answering. He denies patient having a medical history. He reports another brother committing suicide in Oct 13, 1997. Patient was previously using tobacco snuff when he was living alone. He denies patient using marijuana regularly in  the past. Patient has a history of being a sex offender and has in jail for 2 years. He denies patient having access to weapons. He denies patient attempting suicide in the past.   Continued Clinical Symptoms:  Alcohol Use Disorder Identification Test Final Score (AUDIT): 3 The "Alcohol Use Disorders Identification Test", Guidelines for Use in Primary Care, Second Edition.  World Science writer [facility]). Score between 0-7:  no or low risk or alcohol  related problems. Score between 8-15:  moderate risk of alcohol related problems. Score between 16-19:  high risk of alcohol related problems. Score 20 or above:  warrants further diagnostic evaluation for alcohol dependence and treatment.   CLINICAL FACTORS:   Schizophrenia:   Paranoid or undifferentiated type   Musculoskeletal: Strength & Muscle Tone: within normal limits Gait & Station: normal Patient leans: N/A  Psychiatric Specialty Exam:  Presentation  General Appearance: Disheveled     Eye Contact:Fair     Speech:Clear and Coherent     Speech Volume:Normal     Handedness:Right     Mood and Affect  Mood:Euthymic     Affect:Congruent       Thought Process  Thought Processes:Irrevelant; Disorganized     Duration of Psychotic Symptoms: Greater than six months   Past Diagnosis of Schizophrenia or Psychoactive disorder: Yes   Descriptions of Associations:Tangential     Orientation:Partial     Thought Content:Illogical; Scattered; Tangential; Paranoid Ideation     Hallucinations:Hallucinations: None     Ideas of Reference:Paranoia; Delusions     Suicidal Thoughts:Suicidal Thoughts: No     Homicidal Thoughts:Homicidal Thoughts: No       Sensorium  Memory:Remote Poor     Judgment:Poor     Insight:Poor       Executive Functions  Concentration:Poor     Attention Span:Poor     Recall:Poor     Fund of Knowledge:Poor     Language:Fair       Psychomotor Activity  Psychomotor Activity:Psychomotor Activity: Normal       Assets  Assets:Resilience       Sleep  Sleep:Sleep: Fair     Physical Exam Vitals reviewed.  Constitutional:      Appearance: Normal appearance.  HENT:     Head: Normocephalic and atraumatic.  Cardiovascular:     Rate and Rhythm: Normal rate.  Pulmonary:     Effort: Pulmonary effort is normal.  Musculoskeletal:        General: Normal range of motion.  Neurological:     Mental Status: He  is alert. He is disoriented.      Review of Systems  Constitutional:  Negative for chills and fever.  Respiratory:  Negative for shortness of breath.   Cardiovascular:  Negative for chest pain and palpitations.  Gastrointestinal:  Negative for nausea and vomiting.  Neurological:  Negative for headaches.      COGNITIVE FEATURES THAT CONTRIBUTE TO RISK:  Loss of executive function    SUICIDE RISK:  Mild:  Suicidal ideation of limited frequency, intensity, duration, and specificity.  There are no identifiable plans, no associated intent, mild dysphoria and related symptoms, good self-control (both objective and subjective assessment), few other risk factors, and identifiable protective factors, including available and accessible social support.  PLAN OF CARE: See H&P for assessment and plan.   I certify that inpatient services furnished can reasonably be expected to improve the patient's condition.   Lance Muss, MD 10/04/2023, 12:12 PM

## 2023-10-04 NOTE — Progress Notes (Signed)
 Patient appears very confused, with disorganized thoughts, word salad. With much encouragement patient took morning meds. Refused breakfast but ate snack and drank fluids with encouragement. He remains paranoid and suspicious, RN needed to verbally redirect numerous times.

## 2023-10-04 NOTE — Progress Notes (Signed)
 Patient ate 100% lunch, but needed much encouragement, redirection and prompting. Patient continues to have disorganized thoughts and word salad. No aggressive behaviors observed or reported.

## 2023-10-04 NOTE — Progress Notes (Signed)
   10/04/23 2200  Psych Admission Type (Psych Patients Only)  Admission Status Involuntary  Psychosocial Assessment  Patient Complaints Anxiety  Eye Contact Fair  Facial Expression Anxious;Animated  Affect Anxious;Preoccupied  Speech Slow;Tangential  Interaction Cautious  Motor Activity Slow  Appearance/Hygiene In scrubs  Behavior Characteristics Guarded;Cooperative  Mood Suspicious;Anxious;Preoccupied  Aggressive Behavior  Effect No apparent injury  Thought Chartered certified accountant of ideas  Content Delusions  Delusions None reported or observed  Perception Derealization  Hallucination None reported or observed  Judgment Impaired  Confusion Mild  Danger to Self  Current suicidal ideation? Denies  Danger to Others  Danger to Others None reported or observed

## 2023-10-04 NOTE — H&P (Addendum)
 Psychiatric Admission Assessment Adult  Patient Identification: Anthony Knox MRN:  [id number] Date of Evaluation:  10/04/2023  Chief Complaint:  Schizophrenia (HCC) [F20.9],  Schizophrenia (HCC)  Principal Problem:   Schizophrenia (HCC)   History of Present Illness:  Anthony Knox is a 63 y.o., male with a past psychiatric history significant for schizoaffective disorder, bipolar type who presents to the [facility] Involuntary from [facility] for evaluation and management of agitation in his group home and psychosis.   Initial assessment on 3/23, patient was evaluated on the inpatient unit. Patient is able to state his full name but otherwise, he did not appropriately answer his age, the current date, place, and situation. For his age, he answers "non relevant-how many teeth you have". When I asked him where he lives, he states "I forgot- by myself with others". When I asked if he has family around, he states "I dont know-what family?". He states somebody in the RX owns his life. When I asked him what was occurring in the group home, he states "stuff was coming out of the TV, weird lights, celestials". He states "I am against my family. They are out to prove that they are better than me. The good people are gone and the ones left are bad". When I asked him who are the good people, he states "the ones that died young". When I asked who, he says "Anthony Knox the elf and Anthony Knox the bear". When I asked if he was taking medications prior to admission, he states that he was on a tranquilizer to calm his nerves. He states "An injection for subtraction. One side is for and one side is against". He deneis SI and HI. When asked about AVH, he looks around and says "above is the moon and below is the grass". I discussed that we will be putting him on medication to help with his thoughts and he was agreeable.  Psychiatric medications prior to admission: unclear  Collateral information obtained from Anthony Knox, patient's brother (phone number [phone] ) He states that he does not know of what brought the patient in recently. He states that he last saw his brother many months ago and whenever he would visit, patient would be upset because patient thinks that the brother is stealing from him. He believes the patient was diagnosed with schizophrenia 25 years ago and their mother was living with him all his life in which she would help him stay compliant with his medications. His mother died in 10/21/2006 and he feels that is when the patient really declined. Patient was living alone until 2 years ago when he went to a group home. He went to [facility] after some time and was there for 8 months. He believes the patient was on Invega which was helpful. Previously, patient would have psychotic episodes of feeling paranoia, agitated, and impulsive but he does not have a good timeline of how patient acts recently regarding "baseline". He's tried to call the group home that he was staying at but they have not been answering. He denies patient having a medical history. He reports another brother committing suicide in Oct 20, 1997. Patient was previously using tobacco snuff when he was living alone. He denies patient using marijuana regularly in the past. Patient has a history of being a sex offender and has in jail for 2 years. He denies patient having access to weapons. He denies patient attempting suicide in the past.   Patient's brother (legal guardian) consents to patient starting  Depakote and Risperdal with the plan to transition to [facility] if tolerating and effective.  I appempted to contact [facility] [phone]) and they did not answer. The mailbox was full so I was not able to leave a VM.  Past Psychiatric History:  Previous psychiatric diagnoses: schizoaffective d/o Prior psychiatric treatment: collateral is unsure Psychiatric medication compliance history: poor  Current psychiatric treatment: collateral is  unsure Current psychiatrist: collateral is unsure Current therapist: collateral is unsure  Previous hospitalizations yes, previously in [facility] for 8 months; per chart review was in [facility] in 2019 History of suicide attempts: denies History of self harm: denies  Substance Use History: Alcohol: denies  Tobacco: previously used chewable tobacco Marijuana: denies  Cocaine: denies  Methamphetamines: denies  Opiates: denies  Benzodiazepines: denies  Ecstasy: denies  Prescribed Meds abuse: denies   Substance Abuse History in the last 12 months: No  Past Medical/Surgical History:  Medical Diagnoses: collateral denies  Allergies: Geodon-arrythmia  Family History:  Psych: brother committed suicide, other brother has ADHD and bipolar disorder  Social History:  Housing: lives in [facility] Education: finished 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 Yes Has the patient been a risk to self in the past 6 months? Yes Has the patient been a risk to self within the distant past? Yes Is the patient a risk to others? No Has the patient been a risk to others in the past 6 months? No Has the patient been a risk to others within the distant past? No  Alcohol Screening: Patient refused Alcohol Screening Tool: Yes 1. How often do you have a drink containing alcohol?: Monthly or less 2. How many drinks containing alcohol do you have on a typical day when you are drinking?: 3 or 4 3. How often do you have six or more drinks on one occasion?: Less than monthly AUDIT-C Score: 3 4. How often during the last year have you found that you were not able to stop drinking once you had started?: Never 5. How often during the last year have you failed to do what was normally expected from you because of drinking?: Never 6. How often during the last year have you needed a first drink in the morning to get yourself going after a  heavy drinking session?: Never 7. How often during the last year have you had a feeling of guilt of remorse after drinking?: Never 8. How often during the last year have you been unable to remember what happened the night before because you had been drinking?: Never 9. Have you or someone else been injured as a result of your drinking?: No 10. Has a relative or friend or a doctor or another health worker been concerned about your drinking or suggested you cut down?: No Alcohol Use Disorder Identification Test Final Score (AUDIT): 3 Alcohol Brief Interventions/Follow-up: Patient Refused Tobacco Screening:    Lab Results:  Results for orders placed or performed during the hospital encounter of 09/29/23 (from the past 48 hours)  CBG monitoring, ED     Status: Abnormal   Collection Time: 10/02/23  6:05 PM  Result Value Ref Range   Glucose-Capillary 161 (H) 70 - 99 mg/dL    Comment: Glucose reference range applies only to samples taken after fasting for at least 8 hours.  CBG monitoring, ED     Status: None   Collection Time: 10/02/23  8:49 PM  Result Value Ref Range   Glucose-Capillary 97 70 - 99 mg/dL    Comment: Glucose reference range applies only to samples taken after fasting for at least 8 hours.  TSH     Status: None   Collection Time: 10/03/23  7:33 PM  Result Value Ref Range   TSH 4.287 0.350 - 4.500 uIU/mL    Comment: Performed by a 3rd Generation assay with a functional sensitivity of <=0.01 uIU/mL. Performed at [facility], [address] [place], [place] [address]   T4, free     Status: None   Collection Time: 10/03/23  7:33 PM  Result Value Ref Range   Free T4 0.97 0.61 - 1.12 ng/dL    Comment: (NOTE) Biotin ingestion may interfere with free T4 tests. If the results are inconsistent with the TSH level, previous test results, or the clinical presentation, then consider biotin interference. If needed, order repeat testing after stopping biotin. Performed at  [facility], [address] [place], [place] [address]     Blood Alcohol level:  Lab Results  Component Value Date   [facility] <10 09/29/2023   ETH <10 11/28/2021    Metabolic Disorder Labs:  Lab Results  Component Value Date   HGBA1C 5.9 (H) 12/07/2020   MPG 123 12/07/2020   MPG 122.63 05/08/2018   Lab Results  Component Value Date   PROLACTIN 34.1 (H) 05/08/2018   Lab Results  Component Value Date   CHOL 170 12/07/2020   TRIG 56 12/07/2020   HDL 32 (L) 12/07/2020   CHOLHDL 5.3 12/07/2020   VLDL 11 12/07/2020   LDLCALC 127 (H) 12/07/2020   LDLCALC 87 05/08/2018    Current Medications: Current Facility-Administered Medications  Medication Dose Route Frequency Provider Last Rate Last Admin   acetaminophen (TYLENOL) tablet 650 mg  650 mg Oral Q6H PRN Chales Abrahams, NP       alum & mag hydroxide-simeth (MAALOX/MYLANTA) 200-200-20 MG/5ML suspension 30 mL  30 mL Oral Q4H PRN Ophelia Shoulder E, NP       haloperidol (HALDOL) tablet 5 mg  5 mg Oral TID PRN Chales Abrahams, NP       And   diphenhydrAMINE (BENADRYL) capsule 50 mg  50 mg Oral TID PRN Ophelia Shoulder E, NP       haloperidol lactate (HALDOL) injection 5 mg  5 mg Intramuscular TID PRN Chales Abrahams, NP       And   diphenhydrAMINE (BENADRYL) injection 50 mg  50 mg Intramuscular TID PRN Chales Abrahams, NP       And   LORazepam (ATIVAN) injection 2 mg  2 mg Intramuscular TID PRN Ophelia Shoulder E, NP       haloperidol lactate (HALDOL) injection 10 mg  10 mg Intramuscular TID PRN Ophelia Shoulder E, NP       And   diphenhydrAMINE (BENADRYL) injection 50 mg  50 mg Intramuscular TID PRN Chales Abrahams, NP       And   LORazepam (ATIVAN) injection 2 mg  2 mg Intramuscular TID PRN Ophelia Shoulder E, NP       magnesium hydroxide (MILK OF MAGNESIA) suspension 30 mL  30 mL Oral Daily PRN Ophelia Shoulder E, NP       prazosin (MINIPRESS) capsule 2 mg  2 mg Oral QHS Ophelia Shoulder E, NP       risperiDONE (RISPERDAL) tablet 1  mg  1 mg Oral BID Lance Muss, MD  valproic acid (DEPAKENE) 250 MG/5ML solution 250 mg  250 mg Oral BID Ophelia Shoulder E, NP   250 mg at 10/04/23 0804    PTA Medications: Medications Prior to Admission  Medication Sig Dispense Refill Last Dose/Taking   benztropine (COGENTIN) 0.5 MG tablet Take 1 tablet (0.5 mg total) by mouth 2 (two) times daily in the am and at bedtime.. For prevention of drug induced tremors (Patient not taking: Reported on 11/28/2021) 60 tablet 0    hydrOXYzine (ATARAX/VISTARIL) 25 MG tablet Take 1 tablet (25 mg total) by mouth every 6 (six) hours as needed for anxiety (Sleep). (Patient not taking: Reported on 11/28/2021) 75 tablet 0    levothyroxine (SYNTHROID) 50 MCG tablet Take 1 tablet (50 mcg total) by mouth daily at 6 (six) AM. For thyroid hormone replacement (Patient not taking: Reported on 11/28/2021) 15 tablet 0    paliperidone (INVEGA SUSTENNA) 156 MG/ML SUSY injection Inject 1 mL (156 mg total) into the muscle every 28 (twenty-eight) days. Next dose due 02/28/2021. (Patient not taking: Reported on 11/28/2021) 1.2 mL 0    polyethylene glycol (MIRALAX / GLYCOLAX) 17 g packet Take 17 g by mouth daily. (Patient not taking: Reported on 11/28/2021) 14 each 0    prazosin (MINIPRESS) 2 MG capsule Take 2 mg by mouth at bedtime. (Patient not taking: Reported on 09/30/2023)      ramelteon (ROZEREM) 8 MG tablet Take 1 tablet (8 mg total) by mouth at bedtime. For sleep (Patient not taking: Reported on 11/28/2021) 15 tablet 0    risperiDONE (RISPERDAL) 2 MG tablet Take 1 tablet (2 mg total) by mouth at bedtime. For mood control (Patient not taking: Reported on 11/28/2021) 30 tablet 0    senna (SENOKOT) 8.6 MG TABS tablet Take 1 tablet (8.6 mg total) by mouth daily. (May buy from over the counter): For constipation (Patient not taking: Reported on 11/28/2021) 1 tablet 0    valproic acid (DEPAKENE) 250 MG/5ML solution Take by mouth See admin instructions. Give 15 ml by mouth twice daily  (Patient not taking: Reported on 09/30/2023)        Physical Findings: AIMS: No  CIWA:    COWS:     Mental Status Exam  Presentation  General Appearance: Disheveled   Eye Contact:Fair   Speech:Clear and Coherent   Speech Volume:Normal   Handedness:Right   Mood and Affect  Mood:Euthymic   Affect:Congruent    Thought Process  Thought Processes:Irrevelant; Disorganized   Duration of Psychotic Symptoms: Greater than six months  Past Diagnosis of Schizophrenia or Psychoactive disorder: Yes  Descriptions of Associations:Tangential   Orientation:Partial   Thought Content:Illogical; Scattered; Tangential; Paranoid Ideation   Hallucinations:Hallucinations: None   Ideas of Reference:Paranoia; Delusions   Suicidal Thoughts:Suicidal Thoughts: No   Homicidal Thoughts:Homicidal Thoughts: No    Sensorium  Memory:Remote Poor   Judgment:Poor   Insight:Poor    Executive Functions  Concentration:Poor   Attention Span:Poor   Recall:Poor   Fund of Knowledge:Poor   Language:Fair    Psychomotor Activity  Psychomotor Activity:Psychomotor Activity: Normal    Assets  Assets:Resilience    Sleep  Sleep:Sleep: Fair   Physical Exam Vitals reviewed.  Constitutional:      Appearance: Normal appearance.  HENT:     Head: Normocephalic and atraumatic.  Cardiovascular:     Rate and Rhythm: Normal rate.  Pulmonary:     Effort: Pulmonary effort is normal.  Musculoskeletal:        General: Normal range of motion.  Neurological:  Mental Status: He is alert. He is disoriented.    Review of Systems  Constitutional:  Negative for chills and fever.  Respiratory:  Negative for shortness of breath.   Cardiovascular:  Negative for chest pain and palpitations.  Gastrointestinal:  Negative for nausea and vomiting.  Neurological:  Negative for headaches.    Blood pressure (!) 127/101, pulse (!) 114, temperature 98.3 F (36.8 C),  temperature source Oral, resp. rate 18, height 5\' 6"  (1.676 m), weight 66.2 kg, SpO2 99%. Body mass index is 23.57 kg/m.   Treatment Plan Summary: Daily contact with patient to assess and evaluate symptoms and progress in treatment and medication management  ASSESSMENT: Anthony Knox is a 63 y.o., male with a past psychiatric history significant for schizoaffective disorder, bipolar type who presents to the [facility] Involuntary from [facility] for evaluation and management of agitation in his group home and psychosis.   Patient is giving bizarre, nonsensical responses when asking him questions. During my interaction, he was not agitation but per ED notes, he was apparently agitated in the group home. Unfortunately, the group home is not answering calls and the mailbox to leave a voicemail is full. Patient's brother states baseline is patient being calm. He does not know when patient last received Invega or what medications he was on prior to admission. He recalls that Hinda Glatter may have helped with patient's symptoms so we will start Risperdal and increase until we see improvement. Patient's brother plans on visiting and letting us know how he feels the patient is improving throughout his hospitalization. We will also put in a [facility] referral as pt was hospitalized for 8 months at [facility] in the past.   PLAN: Safety and Monitoring:  -- Involuntary admission to inpatient psychiatric unit for safety, stabilization and treatment  -- Daily contact with patient to assess and evaluate symptoms and progress in treatment  -- Patient's case to be discussed in multi-disciplinary team meeting  -- Observation Level : q15 minute checks  -- Vital signs:  q12 hours  -- Precautions: suicide, elopement, and assault  2. Medications:    Psychiatric Diagnosis and Treatment Schizoaffective disorder, bipolar type -Start Risperdal 1 mg q12H for psychosis -Start Depakene 250 mg BID for mood  stabilization -Trazodone 50 mg at bedtime as needed for insomnia -Atarax 25 mg TID as needed for anxiety -Agitation Protocol: Haldol, Benadryl, Ativan  Medical Diagnosis and Treatment None  Other as needed medications  Tylenol 650 mg every 6 hours as needed for pain Mylanta 30 mL every 4 hours as needed for indigestion Milk of magnesia 30 mL daily as needed for constipation   The risks/benefits/side-effects/alternatives to the above medication were discussed in detail with the patient and time was given for questions. The patient consents to medication trial. FDA black box warnings, if present, were discussed.  The patient is agreeable with the medication plan, as above. We will monitor the patient's response to pharmacologic treatment, and adjust medications as necessary.  3. Routine and other pertinent labs: EKG monitoring: QTc: pending  Metabolism / endocrine: BMI: Body mass index is 23.57 kg/m.   CBC: unremarkable CMP: unremarkable Ethanol: <10 TSH: 4.287 A1c: pending Lipid panel: pending Pending CT head UA pending  4. Group Therapy:  -- Encouraged patient to participate in unit milieu and in scheduled group therapies   -- Short Term Goals: Ability to identify changes in lifestyle to reduce recurrence of condition will improve, Ability to verbalize feelings will improve, Ability to disclose and  discuss suicidal ideas, Ability to demonstrate self-control will improve, Ability to identify and develop effective coping behaviors will improve, Ability to maintain clinical measurements within normal limits will improve, Compliance with prescribed medications will improve, and Ability to identify triggers associated with substance abuse/mental health issues will improve  -- Long Term Goals: Improvement in symptoms so as ready for discharge -- Patient is encouraged to participate in group therapy while admitted to the psychiatric unit. -- We will address other chronic and acute  stressors, which contributed to the patient's Schizophrenia (HCC) in order to reduce the risk of self-harm at discharge.  5. Discharge Planning:   -- Social work and case management to assist with discharge planning and identification of hospital follow-up needs prior to discharge  -- Estimated LOS: 5-7 days  -- Discharge Concerns: Need to establish a safety plan; Medication compliance and effectiveness  -- Discharge Goals: Return home with outpatient referrals for mental health follow-up including medication management/psychotherapy  I certify that inpatient services furnished can reasonably be expected to improve the patient's condition.    I discussed my assessment, planned testing and intervention for the patient with Dr. Abbott Pao who agrees with my formulated course of action.   Lance Muss, MD, PGY-2 3/23/202511:58 AM

## 2023-10-05 ENCOUNTER — Ambulatory Visit (HOSPITAL_COMMUNITY)
Admission: RE | Admit: 2023-10-05 | Discharge: 2023-10-05 | Disposition: A | Discharge status: Home / Self Care | Source: Ambulatory Visit | Attending: Psychiatry | Admitting: Psychiatry

## 2023-10-05 ENCOUNTER — Encounter (HOSPITAL_COMMUNITY): Payer: Self-pay

## 2023-10-05 DIAGNOSIS — F250 Schizoaffective disorder, bipolar type: Secondary | ICD-10-CM | POA: Diagnosis not present

## 2023-10-05 DIAGNOSIS — F29 Unspecified psychosis not due to a substance or known physiological condition: Secondary | ICD-10-CM | POA: Insufficient documentation

## 2023-10-05 LAB — LIPID PANEL
Cholesterol: 162 mg/dL (ref 0–200)
HDL: 47 mg/dL (ref >40)
LDL Cholesterol: 104 mg/dL — ABNORMAL HIGH (ref 0–99)
Total CHOL/HDL Ratio: 3.4 ratio
Triglycerides: 54 mg/dL (ref <150)
VLDL: 11 mg/dL (ref 0–40)

## 2023-10-05 LAB — HEMOGLOBIN A1C
Hgb A1c MFr Bld: 5.6 % (ref 4.8–5.6)
Mean Plasma Glucose: 114.02 mg/dL

## 2023-10-05 MED ORDER — TRAZODONE HCL 100 MG PO TABS
100.0000 mg | ORAL_TABLET | Freq: Every evening | ORAL | Status: DC | PRN
  Administered 2023-10-05 – 2023-10-19 (×11): 100 mg via ORAL
  Filled 2023-10-05 (×13): qty 1

## 2023-10-05 MED ORDER — RISPERIDONE 2 MG PO TABS
2.0000 mg | ORAL_TABLET | Freq: Two times a day (BID) | ORAL | Status: DC
  Administered 2023-10-05 – 2023-10-06 (×2): 2 mg via ORAL
  Filled 2023-10-05 (×6): qty 1

## 2023-10-05 NOTE — Progress Notes (Signed)
   10/05/23 2115  Psych Admission Type (Psych Patients Only)  Admission Status Involuntary  Psychosocial Assessment  Patient Complaints Sleep disturbance  Eye Contact Fair  Facial Expression Anxious;Animated  Affect Anxious;Preoccupied  Speech Slow;Tangential  Interaction Cautious  Motor Activity Slow  Appearance/Hygiene In scrubs  Behavior Characteristics Cooperative  Mood Suspicious  Aggressive Behavior  Effect No apparent injury  Thought Chartered certified accountant of ideas  Content Delusions  Delusions None reported or observed  Perception Derealization  Hallucination None reported or observed  Judgment Impaired  Confusion Mild  Danger to Self  Current suicidal ideation? Denies  Danger to Others  Danger to Others None reported or observed

## 2023-10-05 NOTE — Group Note (Signed)
 Date:  10/05/2023 Time:  8:55 PM  Group Topic/Focus:  Wrap-Up Group:   The focus of this group is to help patients review their daily goal of treatment and discuss progress on daily workbooks.    Participation Level:  Did Not Attend  Scot Dock 10/05/2023, 8:55 PM

## 2023-10-05 NOTE — Plan of Care (Signed)
  Problem: Education: Goal: Emotional status will improve Outcome: Progressing   Problem: Activity: Goal: Interest or engagement in activities will improve Outcome: Progressing Goal: Sleeping patterns will improve Outcome: Progressing
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
## 2023-10-05 NOTE — Group Note (Signed)
 LCSW Group Therapy Note   Group Date: 10/05/2023 Start Time: 1300 End Time: 1400  Participation:  patient was present, respectful and somewhat engaged in the conversation.  Type of Therapy:  Group Therapy  Title:  Speaking from the Heart: Communicating with Understanding and Empathy  Objective:  To help participants develop effective communication skills to express themselves clearly, listen actively, and navigate conflicts in a healthy way.  Goals: Increase awareness of verbal and non-verbal communication skills. Practice using "I" statements and active listening techniques. Learn coping strategies for managing communication stress.  Summary: Participants explored the importance of communication, discussed challenges, and practiced skills such as active listening and assertive expression. They reflected on past experiences and identified ways to improve communication in their daily lives.  Therapeutic Modalities: Cognitive-Behavioral Therapy (CBT): Restructuring negative thought patterns in communication. Mindfulness: Staying present and calm during conversations.   Alla Feeling, LCSWA 10/05/2023  6:41 PM

## 2023-10-05 NOTE — BH IP Treatment Plan (Signed)
 Interdisciplinary Treatment and Diagnostic Plan Update  10/05/2023 Time of Session: 1115am Anthony Knox MRN: [id number]  Principal Diagnosis: Schizophrenia [facility])  Secondary Diagnoses: Principal Problem:   Schizophrenia (HCC)   Current Medications:  Current Facility-Administered Medications  Medication Dose Route Frequency Provider Last Rate Last Admin   acetaminophen (TYLENOL) tablet 650 mg  650 mg Oral Q6H PRN Chales Abrahams, NP       alum & mag hydroxide-simeth (MAALOX/MYLANTA) 200-200-20 MG/5ML suspension 30 mL  30 mL Oral Q4H PRN Ophelia Shoulder E, NP       haloperidol (HALDOL) tablet 5 mg  5 mg Oral TID PRN Chales Abrahams, NP       And   diphenhydrAMINE (BENADRYL) capsule 50 mg  50 mg Oral TID PRN Chales Abrahams, NP       haloperidol lactate (HALDOL) injection 5 mg  5 mg Intramuscular TID PRN Chales Abrahams, NP       And   diphenhydrAMINE (BENADRYL) injection 50 mg  50 mg Intramuscular TID PRN Chales Abrahams, NP       And   LORazepam (ATIVAN) injection 2 mg  2 mg Intramuscular TID PRN Ophelia Shoulder E, NP       haloperidol lactate (HALDOL) injection 10 mg  10 mg Intramuscular TID PRN Chales Abrahams, NP       And   diphenhydrAMINE (BENADRYL) injection 50 mg  50 mg Intramuscular TID PRN Chales Abrahams, NP       And   LORazepam (ATIVAN) injection 2 mg  2 mg Intramuscular TID PRN Chales Abrahams, NP       hydrOXYzine (ATARAX) tablet 25 mg  25 mg Oral TID PRN Kizzie Ide B, MD   25 mg at 10/04/23 2103   magnesium hydroxide (MILK OF MAGNESIA) suspension 30 mL  30 mL Oral Daily PRN Chales Abrahams, NP       risperiDONE (RISPERDAL) tablet 2 mg  2 mg Oral Q12H Peterson Ao, MD       traZODone (DESYREL) tablet 100 mg  100 mg Oral QHS PRN Peterson Ao, MD       valproic acid (DEPAKENE) 250 MG/5ML solution 250 mg  250 mg Oral BID Ophelia Shoulder E, NP   250 mg at 10/05/23 0801   PTA Medications: Medications Prior to Admission  Medication Sig Dispense Refill Last Dose/Taking    benztropine (COGENTIN) 0.5 MG tablet Take 1 tablet (0.5 mg total) by mouth 2 (two) times daily in the am and at bedtime.. For prevention of drug induced tremors (Patient not taking: Reported on 11/28/2021) 60 tablet 0    hydrOXYzine (ATARAX/VISTARIL) 25 MG tablet Take 1 tablet (25 mg total) by mouth every 6 (six) hours as needed for anxiety (Sleep). (Patient not taking: Reported on 11/28/2021) 75 tablet 0    levothyroxine (SYNTHROID) 50 MCG tablet Take 1 tablet (50 mcg total) by mouth daily at 6 (six) AM. For thyroid hormone replacement (Patient not taking: Reported on 11/28/2021) 15 tablet 0    paliperidone (INVEGA SUSTENNA) 156 MG/ML SUSY injection Inject 1 mL (156 mg total) into the muscle every 28 (twenty-eight) days. Next dose due 02/28/2021. (Patient not taking: Reported on 11/28/2021) 1.2 mL 0    polyethylene glycol (MIRALAX / GLYCOLAX) 17 g packet Take 17 g by mouth daily. (Patient not taking: Reported on 11/28/2021) 14 each 0    prazosin (MINIPRESS) 2 MG capsule Take 2 mg by mouth at bedtime. (Patient not taking: Reported on  09/30/2023)      ramelteon (ROZEREM) 8 MG tablet Take 1 tablet (8 mg total) by mouth at bedtime. For sleep (Patient not taking: Reported on 11/28/2021) 15 tablet 0    risperiDONE (RISPERDAL) 2 MG tablet Take 1 tablet (2 mg total) by mouth at bedtime. For mood control (Patient not taking: Reported on 11/28/2021) 30 tablet 0    senna (SENOKOT) 8.6 MG TABS tablet Take 1 tablet (8.6 mg total) by mouth daily. (May buy from over the counter): For constipation (Patient not taking: Reported on 11/28/2021) 1 tablet 0    valproic acid (DEPAKENE) 250 MG/5ML solution Take by mouth See admin instructions. Give 15 ml by mouth twice daily (Patient not taking: Reported on 09/30/2023)       Patient Stressors:    Patient Strengths:    Treatment Modalities: Medication Management, Group therapy, Case management,  1 to 1 session with clinician, Psychoeducation, Recreational therapy.   Physician  Treatment Plan for Primary Diagnosis: Schizophrenia (HCC) Long Term Goal(s):     Short Term Goals: Ability to identify changes in lifestyle to reduce recurrence of condition will improve Ability to verbalize feelings will improve Ability to disclose and discuss suicidal ideas Ability to demonstrate self-control will improve Ability to identify and develop effective coping behaviors will improve Ability to maintain clinical measurements within normal limits will improve Compliance with prescribed medications will improve Ability to identify triggers associated with substance abuse/mental health issues will improve  Medication Management: Evaluate patient's response, side effects, and tolerance of medication regimen.  Therapeutic Interventions: 1 to 1 sessions, Unit Group sessions and Medication administration.  Evaluation of Outcomes: Not Progressing  Physician Treatment Plan for Secondary Diagnosis: Principal Problem:   Schizophrenia (HCC)  Long Term Goal(s):     Short Term Goals: Ability to identify changes in lifestyle to reduce recurrence of condition will improve Ability to verbalize feelings will improve Ability to disclose and discuss suicidal ideas Ability to demonstrate self-control will improve Ability to identify and develop effective coping behaviors will improve Ability to maintain clinical measurements within normal limits will improve Compliance with prescribed medications will improve Ability to identify triggers associated with substance abuse/mental health issues will improve     Medication Management: Evaluate patient's response, side effects, and tolerance of medication regimen.  Therapeutic Interventions: 1 to 1 sessions, Unit Group sessions and Medication administration.  Evaluation of Outcomes: Not Progressing   RN Treatment Plan for Primary Diagnosis: Schizophrenia (HCC) Long Term Goal(s): Knowledge of disease and therapeutic regimen to maintain health  will improve  Short Term Goals: Ability to remain free from injury will improve, Ability to verbalize frustration and anger appropriately will improve, Ability to demonstrate self-control, Ability to participate in decision making will improve, Ability to verbalize feelings will improve, Ability to disclose and discuss suicidal ideas, Ability to identify and develop effective coping behaviors will improve, and Compliance with prescribed medications will improve  Medication Management: RN will administer medications as ordered by provider, will assess and evaluate patient's response and provide education to patient for prescribed medication. RN will report any adverse and/or side effects to prescribing provider.  Therapeutic Interventions: 1 on 1 counseling sessions, Psychoeducation, Medication administration, Evaluate responses to treatment, Monitor vital signs and CBGs as ordered, Perform/monitor CIWA, COWS, AIMS and Fall Risk screenings as ordered, Perform wound care treatments as ordered.  Evaluation of Outcomes: Not Progressing   LCSW Treatment Plan for Primary Diagnosis: Schizophrenia (HCC) Long Term Goal(s): Safe transition to appropriate next level of care  at discharge, Engage patient in therapeutic group addressing interpersonal concerns.  Short Term Goals: Engage patient in aftercare planning with referrals and resources, Increase social support, Increase ability to appropriately verbalize feelings, Increase emotional regulation, Facilitate acceptance of mental health diagnosis and concerns, Facilitate patient progression through stages of change regarding substance use diagnoses and concerns, Identify triggers associated with mental health/substance abuse issues, and Increase skills for wellness and recovery  Therapeutic Interventions: Assess for all discharge needs, 1 to 1 time with Social worker, Explore available resources and support systems, Assess for adequacy in community support  network, Educate family and significant other(s) on suicide prevention, Complete Psychosocial Assessment, Interpersonal group therapy.  Evaluation of Outcomes: Not Progressing   Progress in Treatment: Attending groups: No. Participating in groups: No. Taking medication as prescribed: Yes. Toleration medication: Yes. Family/Significant other contact made: No, will contact:  consents pending Patient understands diagnosis: No. Discussing patient identified problems/goals with staff: No. Medical problems stabilized or resolved: Yes. Denies suicidal/homicidal ideation: Yes. Issues/concerns per patient self-inventory: No.  New problem(s) identified: No, Describe:  none reported  New Short Term/Long Term Goal(s): medication stabilization, elimination of SI thoughts, development of comprehensive mental wellness plan.    Patient Goals:  Pt unable to participate in treatment team, experiencing psychosis  Discharge Plan or Barriers: Patient recently admitted. CSW will continue to follow and assess for appropriate referrals and possible discharge planning.    Reason for Continuation of Hospitalization: Delusions  Hallucinations Medication stabilization  Estimated Length of Stay: 5-7 days  Last 3 [place] Suicide Severity Risk Score: Flowsheet Row [facility] (Current[facility] 09[facility]29/2023 in [facility] from 01/31/2021 in [facility]  C-SSRS RISK CATEGORY Low Risk No Risk Error: Q6 is Yes, you must answer 7       Last PHQ 2/9 Scores:    01/31/2021   11:44 AM  Depression screen PHQ 2/9  Decreased Interest 3  Down, Depressed, Hopeless 3  PHQ - 2 Score 6  Altered sleeping 3  Tired, decreased energy 3  Change in appetite 3  Feeling bad or failure about yourself  3  Trouble concentrating 3  Moving slowly or fidgety/restless 3  Suicidal thoughts 3   PHQ-9 Score 27  Difficult doing work/chores Somewhat difficult    Scribe for Treatment Team: Kathi Der, LCSWA 10/05/2023 2:06 PM

## 2023-10-05 NOTE — Progress Notes (Signed)
   10/05/23 0801  Psych Admission Type (Psych Patients Only)  Admission Status Involuntary  Psychosocial Assessment  Patient Complaints Sleep disturbance  Eye Contact Fair  Facial Expression Flat  Affect Apprehensive  Speech Slow  Interaction Cautious  Motor Activity Slow  Appearance/Hygiene In scrubs  Behavior Characteristics Guarded  Mood Suspicious  Thought Process  Coherency Concrete thinking  Content WDL  Delusions None reported or observed  Perception WDL  Hallucination None reported or observed  Judgment Impaired  Confusion None  Danger to Self  Current suicidal ideation? Denies  Danger to Others  Danger to Others None reported or observed

## 2023-10-05 NOTE — Progress Notes (Signed)
 Collateral contact [facility]) [phone]   A referral was sent to the hospital.  CSW completed the verbal referral.     Read Drivers, LCSWA 10/05/2023

## 2023-10-05 NOTE — Group Note (Signed)
 Recreation Therapy Group Note   Group Topic:Coping Skills  Group Date: 10/05/2023 Start Time: 1015 End Time: 1045 Facilitators: Charlena Haub-McCall, LRT,CTRS Location: 500 Hall Dayroom   Group Topic: Coping Skills   Goal Area(s) Addresses: Patient will define what a coping skill is. Patient will create a list of healthy coping skills beginning with each letter of the alphabet. Patient will successfully identify positive coping skills they can use post d/c.  Patient will acknowledge benefit(s) of using learned coping skills post d/c.  Intervention: Group work   Activity: Coping A to Z. Patient asked to identify what a coping skill is and when they use them. Patients with Clinical research associate discussed healthy versus unhealthy coping skills. Next patients were given a blank worksheet titled "Coping Skills A-Z". Patients were given 15 minutes to brainstorm with before ideas were presented to the large group. Patients and LRT debriefed on the importance of coping skill selection based on situation and back-up plans when a skill tried is not effective. At the end of group, patients were given an handout of alphabetized strategies to keep for future reference.   Education: Pharmacologist, Scientist, physiological, Discharge Planning.    Education Outcome: Acknowledges education/Verbalizes understanding/In group clarification offered/Additional education needed   Affect/Mood: N/A   Participation Level: Did not attend    Clinical Observations/Individualized Feedback:      Plan: Continue to engage patient in RT group sessions 2-3x/week.   Anthony Knox, LRT,CTRS 10/05/2023 1:29 PM

## 2023-10-05 NOTE — [facility] Suicide Risk Assessment (Signed)
 [facility] INPATIENT:  Family/Significant Other Suicide Prevention Education  Suicide Prevention Education:  Patient Refusal for Family/Significant Other Suicide Prevention Education: The patient Anthony Knox has refused to provide written consent for family/significant other to be provided Family/Significant Other Suicide Prevention Education during admission and/or prior to discharge.  Physician notified.   Myldred Raju O Surina Storts LCSWA 10/05/2023, 5:45 PM

## 2023-10-05 NOTE — Progress Notes (Deleted)
 Patient continues to present disorganized and psychotic with confusion noted.  He is reported by staff to be paranoid at times.  He was observed responding to stimuli in the room by himself.  Still unable to contact group home for collateral information for details regard last treatment for last [place] injection given.  No as needed medication for agitation needed or given.  Will titrate risperidone orally from 1 to 2 mg twice daily to address psychosis, will continue Depakene twice daily and check level on 3/27, will titrate dose if needed.  Will switch oral risperidone to [place] prior to discharge if effective and well-tolerated to improve long-term compliance and decrease risk of decompensation.
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
## 2023-10-05 NOTE — Plan of Care (Signed)
   Problem: Education: Goal: Emotional status will improve Outcome: Progressing Goal: Mental status will improve Outcome: Progressing   Problem: Activity: Goal: Interest or engagement in activities will improve Outcome: Progressing Goal: Sleeping patterns will improve Outcome: Progressing

## 2023-10-05 NOTE — Group Note (Unsigned)
 Date:  10/05/2023 Time:  7:44 PM  Group Topic/Focus:  Wrap-Up Group:   The focus of this group is to help patients review their daily goal of treatment and discuss progress on daily workbooks.     Participation Level:  {BHH PARTICIPATION ZOXWR:60454}  Participation Quality:  {BHH PARTICIPATION QUALITY:22265}  Affect:  {BHH AFFECT:22266}  Cognitive:  {BHH COGNITIVE:22267}  Insight: {BHH Insight2:20797}  Engagement in Group:  {BHH ENGAGEMENT IN UJWJX:91478}  Modes of Intervention:  {BHH MODES OF INTERVENTION:22269}  Additional Comments:  ***  Scot Dock 10/05/2023, 7:44 PM

## 2023-10-05 NOTE — Group Note (Signed)
 Date:  10/05/2023 Time:  5:08 PM  Group Topic/Focus:  Emotional Education:   The focus of this group is to discuss what feelings/emotions are, and how they are experienced. Managing Feelings:   The focus of this group is to identify what feelings patients have difficulty handling and develop a plan to handle them in a healthier way upon discharge. Rediscovering Joy:   The focus of this group is to explore various ways to relieve stress in a positive manner.    Participation Level:  Minimal  Participation Quality:  Inattentive  Affect:  Blunted  Cognitive:  Disorganized  Insight: Limited  Engagement in Group:  None  Modes of Intervention:  Activity and Socialization  Additional Comments:    Anthony Knox 10/05/2023, 5:08 PM

## 2023-10-06 ENCOUNTER — Ambulatory Visit: Admitting: Podiatry

## 2023-10-06 DIAGNOSIS — F201 Disorganized schizophrenia: Secondary | ICD-10-CM

## 2023-10-06 MED ORDER — RISPERIDONE 1 MG/ML PO SOLN
2.0000 mg | Freq: Two times a day (BID) | ORAL | Status: DC
  Administered 2023-10-06 – 2023-10-07 (×2): 2 mg via ORAL
  Filled 2023-10-06 (×5): qty 2

## 2023-10-06 MED ORDER — DOCUSATE SODIUM 100 MG PO CAPS
100.0000 mg | ORAL_CAPSULE | Freq: Every day | ORAL | Status: DC
  Administered 2023-10-06 – 2023-10-21 (×15): 100 mg via ORAL
  Filled 2023-10-06 (×19): qty 1

## 2023-10-06 MED ORDER — SENNA 8.6 MG PO TABS
1.0000 | ORAL_TABLET | Freq: Every day | ORAL | Status: DC
  Administered 2023-10-06 – 2023-10-20 (×13): 8.6 mg via ORAL
  Filled 2023-10-06 (×20): qty 1

## 2023-10-06 NOTE — Plan of Care (Signed)
   Problem: Education: Goal: Emotional status will improve Outcome: Not Progressing Goal: Mental status will improve Outcome: Not Progressing

## 2023-10-06 NOTE — Progress Notes (Cosign Needed Addendum)
 [facility] MD Progress Note  10/06/2023 12:42 PM Anthony Knox  MRN:  [id number]  Principal Problem: Schizophrenia [facility]) Diagnosis: Principal Problem:   Schizophrenia (HCC)   Reason for Admission:  Anthony Knox is a 63 y.o., male with a past psychiatric history significant for schizoaffective disorder, bipolar type who presents to the [facility] Involuntary from [facility] for evaluation and management of agitation in his group home and psychosis  (admitted on 10/03/2023, total  LOS: 3 days )  Chart Review from last 24 hours:  The patient's chart was reviewed and nursing notes were reviewed. The patient's case was discussed in multidisciplinary team meeting.   - Overnight events to report per chart review / staff report: no notable overnight events to report. Patient attempted to cheek medications this morning.  - Patient received all scheduled medications - Patient received the following PRN medications: hydroxyzine and trazodone  Information Obtained Today During Patient Interview: The patient was seen and evaluated on the unit. On assessment today, the patient appears less guarded and suspicious with this provider. Interview was conducted in the day, room with patients consent after offering to go to his room.  Patient continues to be disorganized with answering questions. When provider said good morning, patient stated " There's no such thing as a good morning". He reports that mourning is hard and nothing good about it.  When asking about patient mood his response was " what comes up most come down".   Denies any suicidal ideations, homicidal ideations or visual hallucinations. Patient reports previously hearing "grunt sounds" in the past, but denies current auditory hallucinations.  When asked about brother patient response" I am not a speak easy tavern". He continues to be suspicious about his motivations.   I appempted to contact Group Home Supervisor [name], [phone])   and they did not answer.   Will continue to call throughout admission   Past Psychiatric History:  Previous psychiatric diagnoses: schizoaffective d/o Prior psychiatric treatment: collateral is unsure Psychiatric medication compliance history: poor   Current psychiatric treatment: collateral is unsure Current psychiatrist: collateral is unsure Current therapist: collateral is unsure   Previous hospitalizations yes, previously in [facility] for 8 months; per chart review was in [facility] in 2019 History of suicide attempts: denies History of self harm: denies Past Medical History:  Past Medical History:  Diagnosis Date   Schizophrenia [facility])     Family Psychiatric History:  Psych: brother committed suicide, other brother has ADHD and bipolar disorder   Social History:  Housing: lives in [place] group home Education: finished 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 Weapons: denies  Current Medications: Current Facility-Administered Medications  Medication Dose Route Frequency Provider Last Rate Last Admin   acetaminophen (TYLENOL) tablet 650 mg  650 mg Oral Q6H PRN Chales Abrahams, NP       alum & mag hydroxide-simeth (MAALOX/MYLANTA) 200-200-20 MG/5ML suspension 30 mL  30 mL Oral Q4H PRN Ophelia Shoulder E, NP       haloperidol (HALDOL) tablet 5 mg  5 mg Oral TID PRN Chales Abrahams, NP       And   diphenhydrAMINE (BENADRYL) capsule 50 mg  50 mg Oral TID PRN Ophelia Shoulder E, NP       haloperidol lactate (HALDOL) injection 5 mg  5 mg Intramuscular TID PRN Ophelia Shoulder E, NP       And   diphenhydrAMINE (BENADRYL) injection 50 mg  50 mg Intramuscular TID PRN  Chales Abrahams, NP       And   LORazepam (ATIVAN) injection 2 mg  2 mg Intramuscular TID PRN Ophelia Shoulder E, NP       haloperidol lactate (HALDOL) injection 10 mg  10 mg Intramuscular TID PRN Chales Abrahams, NP       And   diphenhydrAMINE (BENADRYL) injection 50 mg  50 mg Intramuscular TID PRN Chales Abrahams, NP       And   LORazepam (ATIVAN) injection 2 mg  2 mg Intramuscular TID PRN Chales Abrahams, NP       docusate sodium (COLACE) capsule 100 mg  100 mg Oral Daily Peterson Ao, MD       hydrOXYzine (ATARAX) tablet 25 mg  25 mg Oral TID PRN Kizzie Ide B, MD   25 mg at 10/05/23 2043   magnesium hydroxide (MILK OF MAGNESIA) suspension 30 mL  30 mL Oral Daily PRN Chales Abrahams, NP       risperiDONE (RISPERDAL) 1 MG/ML oral solution 2 mg  2 mg Oral Q12H Peterson Ao, MD       senna Mancel Parsons) tablet 8.6 mg  1 tablet Oral Daily Peterson Ao, MD       traZODone (DESYREL) tablet 100 mg  100 mg Oral QHS PRN Peterson Ao, MD   100 mg at 10/05/23 2043   valproic acid (DEPAKENE) 250 MG/5ML solution 250 mg  250 mg Oral BID Chales Abrahams, NP   250 mg at 10/06/23 1610    Lab Results:  Results for orders placed or performed during the hospital encounter of 10/03/23 (from the past 48 hours)  Glucose, capillary     Status: None   Collection Time: 10/04/23  7:38 PM  Result Value Ref Range   Glucose-Capillary 77 70 - 99 mg/dL    Comment: Glucose reference range applies only to samples taken after fasting for at least 8 hours.  Hemoglobin A1c     Status: None   Collection Time: 10/05/23  6:19 AM  Result Value Ref Range   Hgb A1c MFr Bld 5.6 4.8 - 5.6 %    Comment: (NOTE) Pre diabetes:          5.7%-6.4%  Diabetes:              >6.4%  Glycemic control for   <7.0% adults with diabetes    Mean Plasma Glucose 114.02 mg/dL    Comment: Performed at [facility], [address], [place], [place] [address]  Lipid panel     Status: Abnormal   Collection Time: 10/05/23  6:19 AM  Result Value Ref Range   Cholesterol 162 0 - 200 mg/dL   Triglycerides 54 <409 mg/dL   HDL 47 >81 mg/dL   Total CHOL/HDL Ratio 3.4 RATIO   VLDL 11 0 - 40 mg/dL   LDL Cholesterol 191 (H) 0 - 99 mg/dL    Comment:        Total Cholesterol/HDL:CHD Risk Coronary Heart Disease Risk Table                      Men   Women  1/2 Average Risk   3.4   3.3  Average Risk       5.0   4.4  2 X Average Risk   9.6   7.1  3 X Average Risk  23.4   11.0        Use the calculated Patient  Ratio above and the CHD Risk Table to determine the patient's CHD Risk.        ATP III CLASSIFICATION (LDL):  <100     mg/dL   Optimal  409-811  mg/dL   Near or Above                    Optimal  130-159  mg/dL   Borderline  914-782  mg/dL   High  >956     mg/dL   Very High Performed at [facility], [address] [place], [place] [address]     Blood Alcohol level:  Lab Results  Component Value Date   ETH <10 09/29/2023   ETH <10 11/28/2021    Metabolic Labs: Lab Results  Component Value Date   HGBA1C 5.6 10/05/2023   MPG 114.02 10/05/2023   MPG 123 12/07/2020   Lab Results  Component Value Date   PROLACTIN 34.1 (H) 05/08/2018   Lab Results  Component Value Date   CHOL 162 10/05/2023   TRIG 54 10/05/2023   HDL 47 10/05/2023   CHOLHDL 3.4 10/05/2023   VLDL 11 10/05/2023   LDLCALC 104 (H) 10/05/2023   LDLCALC 127 (H) 12/07/2020    Physical Findings: AIMS: No  CIWA:    COWS:     Psychiatric Specialty Exam: General Appearance: Appropriate for Environment; Casual   Eye Contact: Fair   Speech: Clear and Coherent   Volume: Normal   Mood: Euthymic   Affect: Restricted   Thought Content: Paranoid Ideation; Scattered; Tangential; Illogical   Suicidal Thoughts: Suicidal Thoughts: No   Homicidal Thoughts: Homicidal Thoughts: No   Thought Process: Disorganized; Irrevelant   Orientation: Partial     Memory: Recent Poor; Immediate Poor   Judgment: Poor   Insight: Lacking   Concentration: Poor   Recall: Poor   Fund of Knowledge: Poor   Language: Fair   Psychomotor Activity: Psychomotor Activity: Normal   Assets: Housing   Sleep: Sleep: Fair Number of Hours of Sleep: 7.5    Review of Systems Review of Systems  Constitutional:  Negative for chills  and fever.  Respiratory:  Negative for cough.   Gastrointestinal:  Positive for constipation. Negative for nausea and vomiting.  Neurological:  Negative for headaches.  Psychiatric/Behavioral:  Negative for depression, hallucinations and suicidal ideas. The patient is not nervous/anxious and does not have insomnia.     Vital Signs: Blood pressure 118/85, pulse 86, temperature 98.3 F (36.8 C), temperature source Oral, resp. rate 16, height 5\' 6"  (1.676 m), weight 66.2 kg, SpO2 100%. Body mass index is 23.57 kg/m. Physical Exam Constitutional:      Appearance: Normal appearance.  Eyes:     Conjunctiva/sclera: Conjunctivae normal.  Pulmonary:     Effort: Pulmonary effort is normal.  Musculoskeletal:        General: Normal range of motion.  Neurological:     Mental Status: He is alert and oriented to person, place, and time.  Psychiatric:        Attention and Perception: He does not perceive auditory or visual hallucinations.        Mood and Affect: Mood is not anxious or depressed. Affect is not angry.        Speech: Speech is tangential. Speech is not rapid and pressured.        Behavior: Behavior is withdrawn. Behavior is not aggressive or combative. Behavior is cooperative.        Thought Content: Thought content  is paranoid and delusional. Thought content does not include homicidal or suicidal ideation. Thought content does not include homicidal or suicidal plan.     Comments: Appears suspicious, guarded with answers     Assets  Assets: Housing  Treatment Plan Summary: Daily contact with patient to assess and evaluate symptoms and progress in treatment and Medication management  Diagnoses / Active Problems: Schizophrenia (HCC) Principal Problem:   Schizophrenia (HCC)   ASSESSMENT: Anthony Knox is a 63 y.o., male with a past psychiatric history significant for schizoaffective disorder, bipolar type who presents to the [facility] Involuntary from [facility] for  evaluation and management of agitation in his group home and psychosis.    Patient is giving bizarre, nonsensical responses when asking him questions. During my interaction, he was not agitation but per ED notes, he was apparently agitated in the group home. Unfortunately, the group home is not answering calls and the mailbox to leave a voicemail is full. Patient's brother states baseline is patient being calm. He does not know when patient last received Invega or what medications he was on prior to admission. He recalls that Hinda Glatter may have helped with patient's symptoms so we will start Risperdal and increase until we see improvement. Patient's brother plans on visiting and letting us know how he feels the patient is improving throughout his hospitalization. We will also put in a [facility] referral as pt was hospitalized for 8 months at [facility] in the past.   3/25:  Patient continues to be disorganized, giving bizarre, nonsensical responses and confusion when answering questions. Patient not as intrusive today on interview. .  Still unable to contact group home supervisor for collateral information for details regarding date of last injection of [place] injection or what patients mentation baseline. Will order ammonia lab and follow-up CT to rule out organic cause of altered mentation. Patient attempting to cheek medications per nursing report and will transition from tablets to liquid risperidone 2 mg twice daily to address psychosis and titrate dose as needed. Will continue Depakene twice daily and check level on 3/27, will titrate dose if needed. Will switch risperidone to [place] prior to discharge if effective and well-tolerated to improve long-term compliance and decrease risk of decompensation.   PLAN: Safety and Monitoring:             -- Involuntary admission to inpatient psychiatric unit for safety, stabilization and treatment             -- Daily contact with patient to assess and  evaluate symptoms and progress in treatment             -- Patient's case to be discussed in multi-disciplinary team meeting             -- Observation Level : q15 minute checks             -- Vital signs:  q12 hours             -- Precautions: suicide, elopement, and assault   2. Medications:               Psychiatric Diagnosis and Treatment Schizoaffective disorder, bipolar type - Switched to liquid Risperdal 2 mg q12H for psychosis, patient attempted to cheek medicines this morning, can consider increasing dose to optimize symptom management  - Continue Liquid Depakene 250 mg BID for mood stabilization -Trazodone 50 mg at bedtime as needed for insomnia -Atarax 25 mg TID as needed for anxiety -  Ammonia lab tomorrow to assess organic cause of altered mentation  - F/u CT report to assess organic cause of altered mentation  -Agitation Protocol: Haldol, Benadryl, Ativan   Medical Diagnosis and Treatment None   Other as needed medications  Tylenol 650 mg every 6 hours as needed for pain Mylanta 30 mL every 4 hours as needed for indigestion Milk of magnesia 30 mL daily as needed for constipation     The risks/benefits/side-effects/alternatives to the above medication were discussed in detail with the patient and time was given for questions. The patient consents to medication trial. FDA black box warnings, if present, were discussed.   The patient is agreeable with the medication plan, as above. We will monitor the patient's response to pharmacologic treatment, and adjust medications as necessary.  3. Routine and other pertinent labs:             -- Metabolic profile:  BMI: Body mass index is 23.57 kg/m.  Prolactin: Lab Results  Component Value Date   PROLACTIN 34.1 (H) 05/08/2018    Lipid Panel: Lab Results  Component Value Date   CHOL 162 10/05/2023   TRIG 54 10/05/2023   HDL 47 10/05/2023   CHOLHDL 3.4 10/05/2023   VLDL 11 10/05/2023   LDLCALC 104 (H) 10/05/2023    LDLCALC 127 (H) 12/07/2020    HbgA1c: Hgb A1c MFr Bld (%)  Date Value  10/05/2023 5.6    TSH: TSH (uIU/mL)  Date Value  10/03/2023 4.287  07/15/2010 6.293 (H)    EKG monitoring: QTc: 463 w/ PVC  CT final read pending   4. Group Therapy:             -- Encouraged patient to participate in unit milieu and in scheduled group therapies              -- Short Term Goals: Ability to identify changes in lifestyle to reduce recurrence of condition will improve, Ability to verbalize feelings will improve, Ability to disclose and discuss suicidal ideas, Ability to demonstrate self-control will improve, Ability to identify and develop effective coping behaviors will improve, Ability to maintain clinical measurements within normal limits will improve, Compliance with prescribed medications will improve, and Ability to identify triggers associated with substance abuse/mental health issues will improve             -- Long Term Goals: Improvement in symptoms so as ready for discharge -- Patient is encouraged to participate in group therapy while admitted to the psychiatric unit. -- We will address other chronic and acute stressors, which contributed to the patient's Schizophrenia (HCC) in order to reduce the risk of self-harm at discharge.   5. Discharge Planning:              -- Social work and case management to assist with discharge planning and identification of hospital follow-up needs prior to discharge             -- Estimated LOS: 3-5 more days             -- Discharge Concerns: Need to establish a safety plan; Medication compliance and effectiveness             -- Discharge Goals: Return home with outpatient referrals for mental health follow-up including medication management/psychotherapy   I certify that inpatient services furnished can reasonably be expected to improve the patient's condition.    Signed: Peterson Ao, MD 10/06/2023, 12:42 PM

## 2023-10-06 NOTE — Plan of Care (Signed)
   Problem: Education: Goal: Emotional status will improve Outcome: Progressing   Problem: Activity: Goal: Interest or engagement in activities will improve Outcome: Progressing Goal: Sleeping patterns will improve Outcome: Progressing   Problem: Safety: Goal: Periods of time without injury will increase Outcome: Progressing   Problem: Education: Goal: Mental status will improve Outcome: Not Progressing

## 2023-10-06 NOTE — Progress Notes (Signed)
   10/05/23 2115  Psych Admission Type (Psych Patients Only)  Admission Status Involuntary  Psychosocial Assessment  Patient Complaints Sleep disturbance  Eye Contact Fair  Facial Expression Anxious;Animated  Affect Anxious;Preoccupied  Speech Slow;Tangential  Interaction Cautious  Motor Activity Slow  Appearance/Hygiene In scrubs  Behavior Characteristics Cooperative  Mood Suspicious  Aggressive Behavior  Effect No apparent injury  Thought Chartered certified accountant of ideas  Content Delusions  Delusions None reported or observed  Perception Derealization  Hallucination None reported or observed  Judgment Impaired  Confusion Mild  Danger to Self  Current suicidal ideation? Denies  Danger to Others  Danger to Others None reported or observed   Dar Note: Patient presents irritable, disorganized and intrusive.  Argumentative with staff in milieu and during assessment.  Attempted to cheek his medication during administration.  Denies suicidal thoughts, auditory and visual hallucinations.  Patient attended group and participated.  Routine safety checks maintained.  Patient is safe on and off the unit.

## 2023-10-06 NOTE — Progress Notes (Signed)
   10/06/23 2000  Psych Admission Type (Psych Patients Only)  Admission Status Involuntary  Psychosocial Assessment  Patient Complaints None  Eye Contact Fair  Facial Expression Anxious;Animated  Affect Anxious;Preoccupied  Speech Slow;Tangential  Interaction Cautious  Motor Activity Slow  Appearance/Hygiene In scrubs  Behavior Characteristics Cooperative  Mood Preoccupied;Suspicious  Aggressive Behavior  Effect No apparent injury  Thought Chartered certified accountant of ideas  Content Delusions  Delusions None reported or observed  Perception Derealization  Hallucination None reported or observed  Judgment Impaired  Confusion Mild  Danger to Self  Current suicidal ideation? Denies  Danger to Others  Danger to Others None reported or observed

## 2023-10-06 NOTE — Group Note (Signed)
 Date:  10/06/2023 Time:  8:45 PM  Group Topic/Focus:  Wrap-Up Group:   The focus of this group is to help patients review their daily goal of treatment and discuss progress on daily workbooks.    Participation Level:  Active  Participation Quality:  Intrusive and Resistant  Affect:  Flat  Cognitive:  Appropriate  Insight: Limited  Engagement in Group:  Resistant  Modes of Intervention:  Discussion  Additional Comments:   Pt states that he had a good day and has been sleeping and eating well. Pt denied everything. Pt became uninterested in writers questioning and refused to answer questions affectively.   Vevelyn Pat 10/06/2023, 8:45 PM

## 2023-10-07 DIAGNOSIS — F201 Disorganized schizophrenia: Secondary | ICD-10-CM | POA: Diagnosis not present

## 2023-10-07 LAB — AMMONIA: Ammonia: 28 umol/L (ref 9–35)

## 2023-10-07 MED ORDER — RISPERIDONE 1 MG/ML PO SOLN
3.0000 mg | Freq: Two times a day (BID) | ORAL | Status: DC
  Administered 2023-10-07 – 2023-10-12 (×10): 3 mg via ORAL
  Filled 2023-10-07 (×14): qty 3

## 2023-10-07 NOTE — B[facility] Group Notes (Signed)
 Adult Psychoeducational Group Note  Date:  10/07/2023 Time:  5:52 PM  Group Topic/Focus:  Goals Group:   The focus of this group is to help patients establish daily goals to achieve during treatment and discuss how the patient can incorporate goal setting into their daily lives to aide in recovery. Orientation:   The focus of this group is to educate the patient on the purpose and policies of crisis stabilization and provide a format to answer questions about their admission.  The group details unit policies and expectations of patients while admitted.  Participation Level:  Did Not Attend  Participation Quality:    Affect:    Cognitive:    Insight:   Engagement in Group:    Modes of Intervention:    Additional Comments:    Sheran Lawless 10/07/2023, 5:52 PM

## 2023-10-07 NOTE — Progress Notes (Signed)
   10/07/23 1000  Psych Admission Type (Psych Patients Only)  Admission Status Involuntary  Psychosocial Assessment  Patient Complaints None  Eye Contact Fair  Facial Expression Flat  Affect Preoccupied  Speech Slow;Tangential  Interaction Cautious  Motor Activity Slow  Appearance/Hygiene In scrubs  Behavior Characteristics Appropriate to situation  Mood Preoccupied;Suspicious  Thought Process  Coherency Disorganized  Content Delusions  Delusions None reported or observed  Perception Derealization  Hallucination None reported or observed  Judgment Impaired  Confusion Mild  Danger to Self  Current suicidal ideation? Denies

## 2023-10-07 NOTE — Plan of Care (Signed)
  Problem: Education: Goal: Emotional status will improve Outcome: Progressing   Problem: Activity: Goal: Interest or engagement in activities will improve Outcome: Progressing Goal: Sleeping patterns will improve Outcome: Progressing   Problem: Safety: Goal: Periods of time without injury will increase Outcome: Progressing

## 2023-10-07 NOTE — Progress Notes (Signed)
 Recreation Therapy Notes  INPATIENT RECREATION THERAPY ASSESSMENT  Patient Details Name: Anthony Knox MRN: [id number] DOB: September 14, 1960 Today's Date: 10/07/2023       Information Obtained From: Patient  Able to Participate in Assessment/Interview: Yes  Patient Presentation: Alert  Reason for Admission (Per Patient): Other (Comments) ("I didn't have anywhere to go")  Patient Stressors:  (Nothing identified)  Coping Skills:   Exercise, Meditate, Talk, Art, Read  Leisure Interests (2+):  Individual - Other (Comment) ("resist temptation, help others")  Frequency of Recreation/Participation: Other (Comment) (Daily)  Awareness of Community Resources:  Yes  Community Resources:  Restaurants, Engineering geologist  Current Use: Yes  If no, Barriers?:    Expressed Interest in [employer] Information: No  [employer] of Residence:  [place]  Patient Main Form of Transportation: Walk  Patient Strengths:  "my weakness is my strength and strength is my weakness"  Patient Identified Areas of Improvement:  "understanding why people are the way they are and what gave them the right to congregate"  Patient Goal for Hospitalization:  "join the [facility] and revamp the league"  Current SI (including self-harm):  No  Current HI:  No  Current AVH: No  Staff Intervention Plan: Group Attendance, Collaborate with Interdisciplinary Treatment Team  Consent to Intern Participation: N/A   Syra Sirmons-McCall, LRT,CTRS Patrina Andreas A Mazi Brailsford-McCall 10/07/2023, 1:39 PM

## 2023-10-07 NOTE — Plan of Care (Signed)

## 2023-10-07 NOTE — Group Note (Signed)
 Recreation Therapy Group Note   Group Topic:Other  Group Date: 10/07/2023 Start Time: 1025 End Time: 1111 Facilitators: Sten Dematteo-McCall, LRT,CTRS Location: [address] Dayroom   Group Topic/Focus: Music Therapy  Goal Area(s) Addresses:  Patient will select songs that have meaning to them.  Patient will identify the benefits of music.  Intervention: Music  Activity: Music Therapy. LRT and patients discussed the benefits of music when dealing with tough situations. Patients were allowed to pick any songs of their choosing as long as they were clean and appropriate during group.    Affect/Mood: Appropriate   Participation Level: Engaged   Participation Quality: Independent   Behavior: Appropriate   Speech/Thought Process: Focused   Insight: Good   Judgement: Good   Modes of Intervention: Music   Patient Response to Interventions:  Engaged   Education Outcome:  In group clarification offered    Clinical Observations/Individualized Feedback: Pt was attentive, bright and focused. Pt picked the song "Dog and Butterfly" by Heart. Pt explained the song was about good vs evil. Pt was smiling throughout group and attentive to the songs of others.     Plan: Continue to engage patient in RT group sessions 2-3x/week.   Robey Massmann-McCall, LRT,CTRS  10/07/2023 1:20 PM

## 2023-10-07 NOTE — Progress Notes (Signed)
 [facility] MD Progress Note  10/07/2023 9:06 PM [name]  MRN:  [id number]  Principal Problem: Schizophrenia [facility]) Diagnosis: Principal Problem:   Schizophrenia (HCC)   Reason for Admission:  [facility] is a 63 y.o., male with a past psychiatric history significant for schizoaffective disorder, bipolar type who presents to the [facility] for evaluation and management of agitation in his group home and psychosis  (admitted on 10/03/2023, total  LOS: 4 days )  Chart Review from last 24 hours:  The patient's chart was reviewed and nursing notes were reviewed. The patient's case was discussed in multidisciplinary team meeting.   - Overnight events to report per chart review / staff report: no notable overnight events to report. Patient attempted to cheek medications this morning.  - Patient received all scheduled medications - Patient received the following PRN medications: hydroxyzine and trazodone  Information Obtained Today During Patient Interview: The patient was seen and evaluated on the unit. On assessment today, the patient appears less guarded and suspicious with this provider. Interview was conducted in his room.  Continues to be disorganized in answering questions.  When this writer asked the patient to orientation questions regarding his location he stated" [facility]" .  Asked about sleep patient stated" about as good as a slaughtered goat".  Patient states he is not sure why the group home brought him here.  Bonita Quin reported that his aggression and agitation towards staff and defecating in the house drools he replied" I guess is a liar's word against my".  Denies any suicidal ideations, homicidal ideations or visual hallucinations. Patient reports previously hearing "grunt sounds" in the past, but denies current auditory hallucinations.    I appempted to contact Group Home Supervisor [name], [phone])  on 3/25 and they did  not answer.   Will continue to call throughout admission   Past Psychiatric History:  Previous psychiatric diagnoses: schizoaffective d/o Prior psychiatric treatment: collateral is unsure Psychiatric medication compliance history: poor   Current psychiatric treatment: collateral is unsure Current psychiatrist: collateral is unsure Current therapist: collateral is unsure   Previous hospitalizations yes, previously in [facility] for 8 months; per chart review was in [facility] in 2019 History of suicide attempts: denies History of self harm: denies Past Medical History:  Past Medical History:  Diagnosis Date   Schizophrenia [facility])     Family Psychiatric History:  Psych: brother committed suicide, other brother has ADHD and bipolar disorder   Social History:  Housing: lives in [place] group home Education: finished 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 Weapons: denies  Current Medications: Current Facility-Administered Medications  Medication Dose Route Frequency Provider Last Rate Last Admin   acetaminophen (TYLENOL) tablet 650 mg  650 mg Oral Q6H PRN Chales Abrahams, NP       alum & mag hydroxide-simeth (MAALOX/MYLANTA) 200-200-20 MG/5ML suspension 30 mL  30 mL Oral Q4H PRN Ophelia Shoulder E, NP       haloperidol (HALDOL) tablet 5 mg  5 mg Oral TID PRN Chales Abrahams, NP       And   diphenhydrAMINE (BENADRYL) capsule 50 mg  50 mg Oral TID PRN Ophelia Shoulder E, NP       haloperidol lactate (HALDOL) injection 5 mg  5 mg Intramuscular TID PRN Ophelia Shoulder E, NP       And   diphenhydrAMINE (BENADRYL) injection 50 mg  50 mg Intramuscular TID PRN Chales Abrahams,  NP       And   LORazepam (ATIVAN) injection 2 mg  2 mg Intramuscular TID PRN Ophelia Shoulder E, NP       haloperidol lactate (HALDOL) injection 10 mg  10 mg Intramuscular TID PRN Chales Abrahams, NP       And   diphenhydrAMINE (BENADRYL) injection 50 mg  50 mg Intramuscular TID PRN Chales Abrahams, NP        And   LORazepam (ATIVAN) injection 2 mg  2 mg Intramuscular TID PRN Ophelia Shoulder E, NP       docusate sodium (COLACE) capsule 100 mg  100 mg Oral Daily Peterson Ao, MD   100 mg at 10/07/23 1610   hydrOXYzine (ATARAX) tablet 25 mg  25 mg Oral TID PRN Kizzie Ide B, MD   25 mg at 10/07/23 2023   magnesium hydroxide (MILK OF MAGNESIA) suspension 30 mL  30 mL Oral Daily PRN Chales Abrahams, NP       risperiDONE (RISPERDAL) 1 MG/ML oral solution 3 mg  3 mg Oral Q12H Massengill, Nathan, MD   3 mg at 10/07/23 2024   senna (SENOKOT) tablet 8.6 mg  1 tablet Oral Daily Peterson Ao, MD   8.6 mg at 10/07/23 9604   traZODone (DESYREL) tablet 100 mg  100 mg Oral QHS PRN Peterson Ao, MD   100 mg at 10/07/23 2023    Lab Results:  Results for orders placed or performed during the [facility] encounter of 10/03/23 (from the past 48 hours)  Ammonia     Status: None   Collection Time: 10/07/23  6:37 AM  Result Value Ref Range   Ammonia 28 9 - 35 umol/L    Comment: Performed at [facility], [address], [place], [place] [address]    Blood Alcohol level:  Lab Results  Component Value Date   ETH <10 09/29/2023   ETH <10 11/28/2021    Metabolic Labs: Lab Results  Component Value Date   HGBA1C 5.6 10/05/2023   MPG 114.02 10/05/2023   MPG 123 12/07/2020   Lab Results  Component Value Date   PROLACTIN 34.1 (H) 05/08/2018   Lab Results  Component Value Date   CHOL 162 10/05/2023   TRIG 54 10/05/2023   HDL 47 10/05/2023   CHOLHDL 3.4 10/05/2023   VLDL 11 10/05/2023   LDLCALC 104 (H) 10/05/2023   LDLCALC 127 (H) 12/07/2020    Physical Findings: AIMS: No  CIWA:    COWS:     Psychiatric Specialty Exam: General Appearance: Appropriate for Environment; Casual   Eye Contact: Fair   Speech: Clear and Coherent   Volume: Normal   Mood: Euthymic   Affect: Flat   Thought Content: Paranoid Ideation; Illogical; Tangential   Suicidal Thoughts: Suicidal  Thoughts: No   Homicidal Thoughts: Homicidal Thoughts: No   Thought Process: Disorganized   Orientation: Partial (Oriented to person)     Memory: Recent Poor; Immediate Poor   Judgment: Poor   Insight: Lacking   Concentration: Poor   Recall: Poor   Fund of Knowledge: Poor   Language: Fair   Psychomotor Activity: Psychomotor Activity: Normal   Assets: Housing   Sleep: Sleep: Fair Number of Hours of Sleep: 5.25    Review of Systems Review of Systems  Constitutional:  Negative for chills and fever.  Respiratory:  Negative for cough.   Gastrointestinal:  Positive for constipation. Negative for nausea and vomiting.  Neurological:  Negative for headaches.  Psychiatric/Behavioral:  Negative for depression, hallucinations and suicidal ideas. The patient is not nervous/anxious and does not have insomnia.     Vital Signs: Blood pressure 118/85, pulse 86, temperature 98.3 F (36.8 C), temperature source Oral, resp. rate 16, height 5\' 6"  (1.676 m), weight 66.2 kg, SpO2 100%. Body mass index is 23.57 kg/m. Physical Exam Constitutional:      Appearance: Normal appearance.  Eyes:     Conjunctiva/sclera: Conjunctivae normal.  Pulmonary:     Effort: Pulmonary effort is normal.  Musculoskeletal:        General: Normal range of motion.  Neurological:     Mental Status: He is alert and oriented to person, place, and time.  Psychiatric:        Attention and Perception: He does not perceive auditory or visual hallucinations.        Mood and Affect: Mood is not anxious or depressed. Affect is not angry.        Speech: Speech is tangential. Speech is not rapid and pressured.        Behavior: Behavior is withdrawn. Behavior is not aggressive or combative. Behavior is cooperative.        Thought Content: Thought content is paranoid and delusional. Thought content does not include homicidal or suicidal ideation. Thought content does not include homicidal or suicidal plan.      Comments: Appears suspicious, guarded with answers     Assets  Assets: Housing  Treatment Plan Summary: Daily contact with patient to assess and evaluate symptoms and progress in treatment and Medication management  Diagnoses / Active Problems: Schizophrenia (HCC) Principal Problem:   Schizophrenia (HCC)   ASSESSMENT: [facility] is a 63 y.o., male with a past psychiatric history significant for schizoaffective disorder, bipolar type who presents to the [facility] from [facility] for evaluation and management of agitation in his group home and psychosis.    Patient is giving bizarre, nonsensical responses when asking him questions. During my interaction, he was not agitation but per ED notes, he was apparently agitated in the group home. Unfortunately, the group home is not answering calls and the mailbox to leave a voicemail is full. Patient's brother states baseline is patient being calm. He does not know when patient last received Invega or what medications he was on prior to admission. He recalls that Hinda Glatter may have helped with patient's symptoms so we will start Risperdal and increase until we see improvement. Patient's brother plans on visiting and letting us know how he feels the patient is improving throughout his hospitalization. We will also put in a [facility] referral as pt was hospitalized for 8 months at [facility] in the past.   3/26:  Patient continues to be disorganized, giving bizarre, nonsensical responses and confusion when answering questions.  Still unable to contact group home supervisor for collateral information for details regarding date of last injection of [place] injection or what patients mentation baseline. Will increase risperidone 3 mg twice daily to address psychosis and titrate dose as needed. Will continue to monitor for EPS symptoms and hold on starting congentin for antipsychotic prophylaxis given continued confusion..  Will consider  switch risperidone to [place] prior to discharge if effective and well-tolerated to improve long-term compliance and decrease risk of decompensation. Discontinued Depakene giving limited affect small dose.   PLAN: Safety and Monitoring:             -- Involuntary admission to inpatient psychiatric unit for safety, stabilization and  treatment             -- Daily contact with patient to assess and evaluate symptoms and progress in treatment             -- Patient's case to be discussed in multi-disciplinary team meeting             -- Observation Level : q15 minute checks             -- Vital signs:  q12 hours             -- Precautions: suicide, elopement, and assault   2. Medications:               Psychiatric Diagnosis and Treatment Schizoaffective disorder, bipolar type - Increased Risperdal 3 mg q12H for psychosis,will continue to monitor for EPS symptoms, will not start cogentin for Ppx given ongoing confusion  - Discontinue Liquid Depakene 250 mg BID for mood stabilization -Trazodone 50 mg at bedtime as needed for insomnia -Atarax 25 mg TID as needed for anxiety - Ammonia lab normal  - F/u CT report to assess organic cause of altered mentation, will discuss with radiology for formal read -Agitation Protocol: Haldol, Benadryl, Ativan   Medical Diagnosis and Treatment None   Other as needed medications  Tylenol 650 mg every 6 hours as needed for pain Mylanta 30 mL every 4 hours as needed for indigestion Milk of magnesia 30 mL daily as needed for constipation     The risks/benefits/side-effects/alternatives to the above medication were discussed in detail with the patient and time was given for questions. The patient consents to medication trial. FDA black box warnings, if present, were discussed.   The patient is agreeable with the medication plan, as above. We will monitor the patient's response to pharmacologic treatment, and adjust medications as necessary.  3.  Routine and other pertinent labs:             -- Metabolic profile:  BMI: Body mass index is 23.57 kg/m.  Prolactin: Lab Results  Component Value Date   PROLACTIN 34.1 (H) 05/08/2018    Lipid Panel: Lab Results  Component Value Date   CHOL 162 10/05/2023   TRIG 54 10/05/2023   HDL 47 10/05/2023   CHOLHDL 3.4 10/05/2023   VLDL 11 10/05/2023   LDLCALC 104 (H) 10/05/2023   LDLCALC 127 (H) 12/07/2020    HbgA1c: Hgb A1c MFr Bld (%)  Date Value  10/05/2023 5.6    TSH: TSH (uIU/mL)  Date Value  10/03/2023 4.287  07/15/2010 6.293 (H)    EKG monitoring: QTc: 463 w/ PVC  CT final read pending   4. Group Therapy:             -- Encouraged patient to participate in unit milieu and in scheduled group therapies              -- Short Term Goals: Ability to identify changes in lifestyle to reduce recurrence of condition will improve, Ability to verbalize feelings will improve, Ability to disclose and discuss suicidal ideas, Ability to demonstrate self-control will improve, Ability to identify and develop effective coping behaviors will improve, Ability to maintain clinical measurements within normal limits will improve, Compliance with prescribed medications will improve, and Ability to identify triggers associated with substance abuse/mental health issues will improve             -- Long Term Goals: Improvement in symptoms so as ready for discharge -- Patient is encouraged  to participate in group therapy while admitted to the psychiatric unit. -- We will address other chronic and acute stressors, which contributed to the patient's Schizophrenia (HCC) in order to reduce the risk of self-harm at discharge.   5. Discharge Planning:              -- Social work and case management to assist with discharge planning and identification of [facility] follow-up needs prior to discharge             -- Estimated LOS: 3-5 more days             -- Discharge Concerns: Need to establish a safety  plan; Medication compliance and effectiveness             -- Discharge Goals: Return home with outpatient referrals for mental health follow-up including medication management/psychotherapy   I certify that inpatient services furnished can reasonably be expected to improve the patient's condition.    Signed: Peterson Ao, MD 10/07/2023, 9:06 PM

## 2023-10-07 NOTE — Progress Notes (Signed)
   10/07/23 1945  Psych Admission Type (Psych Patients Only)  Admission Status Involuntary  Psychosocial Assessment  Patient Complaints None  Eye Contact Fair  Facial Expression Anxious;Animated  Affect Anxious;Preoccupied  Speech Slow;Tangential  Interaction Cautious  Motor Activity Slow  Appearance/Hygiene In scrubs  Behavior Characteristics Cooperative  Mood Suspicious  Aggressive Behavior  Effect No apparent injury  Thought Chartered certified accountant of ideas  Content Delusions  Delusions None reported or observed  Perception Derealization  Hallucination None reported or observed  Judgment Impaired  Confusion Mild  Danger to Self  Current suicidal ideation? Denies  Danger to Others  Danger to Others None reported or observed

## 2023-10-07 NOTE — [facility] Group Notes (Signed)
 [facility] Group Notes:  (Nursing/MHT/Case Management/Adjunct)  Date:  10/07/2023  Time:  8:39 PM  Type of Therapy:  Psychoeducational Skills  Participation Level:  Active  Participation Quality:  Intrusive, Monopolizing, and Resistant  Affect:  Anxious  Cognitive:  Lacking  Insight:  None  Engagement in Group:  Distracting and Off Topic  Modes of Intervention:  Education  Summary of Progress/Problems: Patient rated his day as a -11 out of a possible 10. He did not share any additional details. The patient attempted to argue with this author. He commented on this author's appearance and said that he could not be trusted. He also wanted to know why this Thereasa Parkin was asking questions in group. The patient claimed that this Thereasa Parkin came from a "[place]".   Westly Pam 10/07/2023, 8:39 PM

## 2023-10-07 NOTE — Progress Notes (Signed)
 Collateral contact [facility]) [phone]    Patient is on the waiting list.   Read Drivers, Theresia Majors 10/07/2023

## 2023-10-08 DIAGNOSIS — F201 Disorganized schizophrenia: Secondary | ICD-10-CM | POA: Diagnosis not present

## 2023-10-08 NOTE — [facility] Group Notes (Signed)
 Adult Psychoeducational Group Note  Date:  10/08/2023 Time:  10:01 AM  Group Topic/Focus:  Goals Group:   The focus of this group is to help patients establish daily goals to achieve during treatment and discuss how the patient can incorporate goal setting into their daily lives to aide in recovery. Orientation:   The focus of this group is to educate the patient on the purpose and policies of crisis stabilization and provide a format to answer questions about their admission.  The group details unit policies and expectations of patients while admitted.  Participation Level:  Did Not Attend  Participation Quality:    Affect:    Cognitive:    Insight:   Engagement in Group:    Modes of Intervention:    Additional Comments:    Sheran Lawless 10/08/2023, 10:01 AM

## 2023-10-08 NOTE — Progress Notes (Signed)
   10/08/23 2015  Psych Admission Type (Psych Patients Only)  Admission Status Involuntary  Psychosocial Assessment  Patient Complaints Irritability  Eye Contact Fair  Facial Expression Anxious;Animated  Affect Anxious;Preoccupied  Speech Slow;Tangential  Interaction Cautious  Motor Activity Slow  Appearance/Hygiene In scrubs  Behavior Characteristics Anxious;Agressive verbally  Mood Labile  Aggressive Behavior  Effect No apparent injury  Thought Chartered certified accountant of ideas  Content Delusions  Delusions None reported or observed  Perception Derealization  Hallucination None reported or observed  Judgment Impaired  Confusion Mild  Danger to Self  Current suicidal ideation? Denies  Danger to Others  Danger to Others None reported or observed

## 2023-10-08 NOTE — Plan of Care (Signed)
   Problem: Education: Goal: Emotional status will improve Outcome: Not Progressing Goal: Mental status will improve Outcome: Not Progressing   Problem: Activity: Goal: Interest or engagement in activities will improve Outcome: Not Progressing

## 2023-10-08 NOTE — Progress Notes (Signed)
   10/08/23 1000  Psych Admission Type (Psych Patients Only)  Admission Status Involuntary  Psychosocial Assessment  Patient Complaints Irritability  Eye Contact Brief  Facial Expression Angry  Affect Irritable  Speech Tangential  Interaction Hostile  Motor Activity Slow  Appearance/Hygiene In scrubs  Behavior Characteristics Agressive verbally  Mood Labile  Thought Process  Coherency Flight of ideas  Content Delusions  Delusions Paranoid  Perception Derealization  Hallucination None reported or observed  Judgment Impaired  Confusion Mild  Danger to Self  Current suicidal ideation? Denies  Danger to Others  Danger to Others None reported or observed   Dar Note: Patient presents with irritable mood and affect.  Medication Risperdal liquid given after several encouragement.  Refused his Senna and Colace.  Routine safety checks maintained.

## 2023-10-08 NOTE — Group Note (Signed)
 Date:  10/08/2023 Time:  9:11 PM  Group Topic/Focus:  Wrap-Up Group:   The focus of this group is to help patients review their daily goal of treatment and discuss progress on daily workbooks.    Participation Level:  Did Not Attend   Scot Dock 10/08/2023, 9:11 PM

## 2023-10-08 NOTE — Progress Notes (Signed)
 Collateral contact - [name] - legal guardian - [phone]   Legal guardian said that patient came to the hospital because he hasn't been adherent to his medications.  He used the bathroom in the furniture drawers, so the group home had to replace the furniture in the home.    Legal guardian believes that reestablishing medication can help with the symptoms, and  injections can aid with medication adherence.  Legal guardian said that patient can return to the group home in [place]; he has been living there since January.     She said that patient doesn't want to have a legal guardian.   Noelene Gang, LCSWA 10/08/2023
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
## 2023-10-08 NOTE — Group Note (Signed)
 Recreation Therapy Group Note   Group Topic:Self-Esteem  Group Date: 10/08/2023 Start Time: 1010 End Time: 1040 Facilitators: Kemyra August-McCall, LRT,CTRS Location: [address] Dayroom   Group Topic: Self-esteem  Goal Area(s) Addresses:  Patient will identify and write at least one positive trait about themself. Patient will acknowledge the benefit of healthy self-esteem. Patient will endorse understanding of ways to increase self-esteem.   Intervention: Personalized Plate- printed license plate template, markers or colored pencils   Activity: LRT began group session with open dialogue asking the patients to define self-esteem and verbally identify positive qualities and traits people may possess. Patients were then instructed to design a personalized license plate, with words and drawings, representing at least 3 positive things about themselves. Pts were encouraged to include favorites, things they are proud of, what they enjoy doing, and dreams for their future. If a patient had a life motto or a meaningful phase that expressed their life values, pt's were asked to incorporate that into their design as well. Patients were given the opportunity to share their completed work with the group.  Education: Healthy self-esteem, Positive character traits, Accepting compliments, Leisure as competence and coping, Support Systems, Discharge planning LRT educated patients on the importance of healthy self-esteem and ways to build self-esteem. LRT addressed discharge planning reviewing positive coping skills and healthy support systems.  Education Outcome: Acknowledges education/In group clarification offered   Affect/Mood: Appropriate   Participation Level: Engaged   Participation Quality: Independent   Behavior: Appropriate   Speech/Thought Process: Delusional and Rational   Insight: Fair   Judgement: Fair    Modes of Intervention: Art   Patient Response to Interventions:   Engaged   Education Outcome:  In group clarification offered    Clinical Observations/Individualized Feedback: Pt was attentive and focused during activity. 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  Pt also expressed being all he can "as long as both sides are even". Pt then began to veer off into delusional thoughts by stating "I have a fear of drowning standing up and want to lay down a different way in bed but can't". Pt was bright during group session.     Plan: Continue to engage patient in RT group sessions 2-3x/week.   Mikesha Migliaccio-McCall, LRT,CTRS  10/08/2023 12:33 PM

## 2023-10-08 NOTE — Progress Notes (Signed)
 Pt was irritable on the unit at times last night, pt initially refused to take his HS medication, but agreed to take it after much coaxing.

## 2023-10-08 NOTE — Plan of Care (Signed)
  Problem: Education: Goal: Emotional status will improve Outcome: Progressing   Problem: Activity: Goal: Interest or engagement in activities will improve Outcome: Progressing Goal: Sleeping patterns will improve Outcome: Progressing

## 2023-10-08 NOTE — Group Note (Signed)
 Occupational Therapy Group Note  Group Topic:Coping Skills  Group Date: 10/08/2023 Start Time: 1421 End Time: 1551 Facilitators: Ted Mcalpine, OT   Group Description: Group encouraged increased engagement and participation through discussion and activity focused on "Coping Ahead." Patients were split up into teams and selected a card from a stack of positive coping strategies. Patients were instructed to act out/charade the coping skill for other peers to guess and receive points for their team. Discussion followed with a focus on identifying additional positive coping strategies and patients shared how they were going to cope ahead over the weekend while continuing hospitalization stay.  Therapeutic Goal(s): Identify positive vs negative coping strategies. Identify coping skills to be used during hospitalization vs coping skills outside of hospital/at home Increase participation in therapeutic group environment and promote engagement in treatment   Participation Level: Minimal   Participation Quality: Independent   Behavior: Appropriate   Speech/Thought Process: Barely audible   Affect/Mood: Flat   Insight: Limited   Judgement: Limited      Modes of Intervention: Education  Patient Response to Interventions:  Attentive   Plan: Continue to engage patient in OT groups 2 - 3x/week.  10/08/2023  Ted Mcalpine, OT  Kerrin Champagne, OT

## 2023-10-09 ENCOUNTER — Encounter (HOSPITAL_COMMUNITY): Payer: Self-pay

## 2023-10-09 DIAGNOSIS — F201 Disorganized schizophrenia: Secondary | ICD-10-CM | POA: Diagnosis not present

## 2023-10-09 LAB — COMPREHENSIVE METABOLIC PANEL WITH GFR
ALT: 12 U/L (ref 0–44)
AST: 15 U/L (ref 15–41)
Albumin: 3.9 g/dL (ref 3.5–5.0)
Alkaline Phosphatase: 65 U/L (ref 38–126)
Anion gap: 10 (ref 5–15)
BUN: 10 mg/dL (ref 6–20)
CO2: 25 mmol/L (ref 22–32)
Calcium: 9.1 mg/dL (ref 8.9–10.3)
Chloride: 105 mmol/L (ref 98–111)
Creatinine, Ser: 0.76 mg/dL (ref 0.61–1.24)
GFR, Estimated: >60 mL/min (ref >60)
Glucose, Bld: 104 mg/dL — ABNORMAL HIGH (ref 70–99)
Potassium: 3.7 mmol/L (ref 3.5–5.1)
Sodium: 140 mmol/L (ref 135–145)
Total Bilirubin: 0.6 mg/dL (ref 0.0–1.2)
Total Protein: 6.8 g/dL (ref 6.5–8.1)

## 2023-10-09 LAB — VITAMIN B12: Vitamin B-12: 796 pg/mL (ref 180–914)

## 2023-10-09 MED ORDER — IBUPROFEN 400 MG PO TABS: 400.0000 mg | ORAL_TABLET | ORAL | Status: DC | PRN

## 2023-10-09 MED ORDER — ACETAMINOPHEN 325 MG PO TABS
650.0000 mg | ORAL_TABLET | Freq: Three times a day (TID) | ORAL | Status: DC
  Administered 2023-10-09 – 2023-10-14 (×12): 650 mg via ORAL
  Filled 2023-10-09 (×25): qty 2

## 2023-10-09 NOTE — Progress Notes (Signed)
   10/08/23 2015  Psych Admission Type (Psych Patients Only)  Admission Status Involuntary  Psychosocial Assessment  Patient Complaints Irritability  Eye Contact Fair  Facial Expression Anxious;Animated  Affect Anxious;Preoccupied  Speech Slow;Tangential  Interaction Cautious  Motor Activity Slow  Appearance/Hygiene In scrubs  Behavior Characteristics Anxious;Agressive verbally  Mood Labile  Aggressive Behavior  Effect No apparent injury  Thought Chartered certified accountant of ideas  Content Delusions  Delusions None reported or observed  Perception Derealization  Hallucination None reported or observed  Judgment Impaired  Confusion Mild  Danger to Self  Current suicidal ideation? Denies  Danger to Others  Danger to Others None reported or observed

## 2023-10-09 NOTE — Hospital Course (Signed)
 Unable to follow-up with facility for updated list:   December 2024   0.5 mg benztropine  Loratadine 10 mg at bedtime   Risperidone 200 mg q8w   Senna 8.6 mg daily   Topamax 100 mg at bedtime   Trazodone 150 mg at bedtime    Patient was previously discharged from hospitalization December 2024. She moved to his current group home in January, did fine for a few weeks and then started to decline. He was mean, irritable, argumentative, would make racial comments and was intermittently non-compliant with medications. He would manipulative and take meds whenever he wanted something from the staff. At his baseline he doesn't answer questions directly and answers in riddles. She suspects that he attempts to be preformative and won't answer others questions appropriately.

## 2023-10-09 NOTE — Group Note (Signed)
 Date:  10/09/2023 Time:  8:34 PM  Group Topic/Focus:  Wrap-Up Group:   The focus of this group is to help patients review their daily goal of treatment and discuss progress on daily workbooks.    Participation Level:  Active  Participation Quality:  Appropriate  Affect:  Appropriate  Cognitive:  Appropriate  Insight: Appropriate  Engagement in Group:  Engaged  Modes of Intervention:  Education and Exploration  Additional Comments:  Patient attended and participated in group tonight. He reports that the thing he like about himself is you get up or not get up.  Lita Mains [facility] 10/09/2023, 8:34 PM
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
## 2023-10-09 NOTE — Progress Notes (Signed)
   10/09/23 2055  Psych Admission Type (Psych Patients Only)  Admission Status Involuntary  Psychosocial Assessment  Patient Complaints Irritability  Eye Contact Brief  Facial Expression Angry  Affect Irritable  Speech Tangential  Interaction Cautious  Motor Activity Slow  Appearance/Hygiene In scrubs  Behavior Characteristics Appropriate to situation  Mood Depressed  Thought Process  Coherency Disorganized  Content Delusions  Delusions None reported or observed  Perception Derealization  Hallucination None reported or observed  Judgment Impaired  Confusion None  Danger to Self  Current suicidal ideation? Denies  Danger to Others  Danger to Others None reported or observed

## 2023-10-09 NOTE — BH IP Treatment Plan (Signed)
 Interdisciplinary Treatment and Diagnostic Plan Update  10/09/2023 Time of Session: 12:00 PM - UPDATE Anthony Knox MRN: [id number]  Principal Diagnosis: Schizophrenia (HCC)  Secondary Diagnoses: Principal Problem:   Schizophrenia (HCC)   Current Medications:  Current Facility-Administered Medications  Medication Dose Route Frequency Provider Last Rate Last Admin   acetaminophen (TYLENOL) tablet 650 mg  650 mg Oral Q8H Peterson Ao, MD   650 mg at 10/09/23 1424   alum & mag hydroxide-simeth (MAALOX/MYLANTA) 200-200-20 MG/5ML suspension 30 mL  30 mL Oral Q4H PRN Chales Abrahams, NP       haloperidol (HALDOL) tablet 5 mg  5 mg Oral TID PRN Chales Abrahams, NP       And   diphenhydrAMINE (BENADRYL) capsule 50 mg  50 mg Oral TID PRN Chales Abrahams, NP       haloperidol lactate (HALDOL) injection 5 mg  5 mg Intramuscular TID PRN Chales Abrahams, NP       And   diphenhydrAMINE (BENADRYL) injection 50 mg  50 mg Intramuscular TID PRN Chales Abrahams, NP       And   LORazepam (ATIVAN) injection 2 mg  2 mg Intramuscular TID PRN Ophelia Shoulder E, NP       haloperidol lactate (HALDOL) injection 10 mg  10 mg Intramuscular TID PRN Chales Abrahams, NP       And   diphenhydrAMINE (BENADRYL) injection 50 mg  50 mg Intramuscular TID PRN Chales Abrahams, NP       And   LORazepam (ATIVAN) injection 2 mg  2 mg Intramuscular TID PRN Ophelia Shoulder E, NP       docusate sodium (COLACE) capsule 100 mg  100 mg Oral Daily Peterson Ao, MD   100 mg at 10/09/23 0847   hydrOXYzine (ATARAX) tablet 25 mg  25 mg Oral TID PRN Kizzie Ide B, MD   25 mg at 10/08/23 2039   ibuprofen (ADVIL) tablet 400 mg  400 mg Oral Q4H PRN Peterson Ao, MD       magnesium hydroxide (MILK OF MAGNESIA) suspension 30 mL  30 mL Oral Daily PRN Chales Abrahams, NP       risperiDONE (RISPERDAL) 1 MG/ML oral solution 3 mg  3 mg Oral Q12H Massengill, Nathan, MD   3 mg at 10/09/23 0846   senna (SENOKOT) tablet 8.6 mg  1 tablet Oral  Daily Peterson Ao, MD   8.6 mg at 10/09/23 0847   traZODone (DESYREL) tablet 100 mg  100 mg Oral QHS PRN Peterson Ao, MD   100 mg at 10/08/23 2039   PTA Medications: Medications Prior to Admission  Medication Sig Dispense Refill Last Dose/Taking   benztropine (COGENTIN) 0.5 MG tablet Take 1 tablet (0.5 mg total) by mouth 2 (two) times daily in the am and at bedtime.. For prevention of drug induced tremors (Patient not taking: Reported on 11/28/2021) 60 tablet 0    hydrOXYzine (ATARAX/VISTARIL) 25 MG tablet Take 1 tablet (25 mg total) by mouth every 6 (six) hours as needed for anxiety (Sleep). (Patient not taking: Reported on 11/28/2021) 75 tablet 0    levothyroxine (SYNTHROID) 50 MCG tablet Take 1 tablet (50 mcg total) by mouth daily at 6 (six) AM. For thyroid hormone replacement (Patient not taking: Reported on 11/28/2021) 15 tablet 0    paliperidone (INVEGA SUSTENNA) 156 MG/ML SUSY injection Inject 1 mL (156 mg total) into the muscle every 28 (twenty-eight) days. Next dose due 02/28/2021. (Patient not taking:  Reported on 11/28/2021) 1.2 mL 0    polyethylene glycol (MIRALAX / GLYCOLAX) 17 g packet Take 17 g by mouth daily. (Patient not taking: Reported on 11/28/2021) 14 each 0    prazosin (MINIPRESS) 2 MG capsule Take 2 mg by mouth at bedtime. (Patient not taking: Reported on 09/30/2023)      ramelteon (ROZEREM) 8 MG tablet Take 1 tablet (8 mg total) by mouth at bedtime. For sleep (Patient not taking: Reported on 11/28/2021) 15 tablet 0    risperiDONE (RISPERDAL) 2 MG tablet Take 1 tablet (2 mg total) by mouth at bedtime. For mood control (Patient not taking: Reported on 11/28/2021) 30 tablet 0    senna (SENOKOT) 8.6 MG TABS tablet Take 1 tablet (8.6 mg total) by mouth daily. (May buy from over the counter): For constipation (Patient not taking: Reported on 11/28/2021) 1 tablet 0    valproic acid (DEPAKENE) 250 MG/5ML solution Take by mouth See admin instructions. Give 15 ml by mouth twice daily  (Patient not taking: Reported on 09/30/2023)       Patient Stressors:    Patient Strengths:    Treatment Modalities: Medication Management, Group therapy, Case management,  1 to 1 session with clinician, Psychoeducation, Recreational therapy.   Physician Treatment Plan for Primary Diagnosis: Schizophrenia (HCC) Long Term Goal(s):     Short Term Goals: Ability to identify changes in lifestyle to reduce recurrence of condition will improve Ability to verbalize feelings will improve Ability to disclose and discuss suicidal ideas Ability to demonstrate self-control will improve Ability to identify and develop effective coping behaviors will improve Ability to maintain clinical measurements within normal limits will improve Compliance with prescribed medications will improve Ability to identify triggers associated with substance abuse/mental health issues will improve  Medication Management: Evaluate patient's response, side effects, and tolerance of medication regimen.  Therapeutic Interventions: 1 to 1 sessions, Unit Group sessions and Medication administration.  Evaluation of Outcomes: Progressing  Physician Treatment Plan for Secondary Diagnosis: Principal Problem:   Schizophrenia (HCC)  Long Term Goal(s):     Short Term Goals: Ability to identify changes in lifestyle to reduce recurrence of condition will improve Ability to verbalize feelings will improve Ability to disclose and discuss suicidal ideas Ability to demonstrate self-control will improve Ability to identify and develop effective coping behaviors will improve Ability to maintain clinical measurements within normal limits will improve Compliance with prescribed medications will improve Ability to identify triggers associated with substance abuse/mental health issues will improve     Medication Management: Evaluate patient's response, side effects, and tolerance of medication regimen.  Therapeutic Interventions: 1  to 1 sessions, Unit Group sessions and Medication administration.  Evaluation of Outcomes: Progressing   RN Treatment Plan for Primary Diagnosis: Schizophrenia (HCC) Long Term Goal(s): Knowledge of disease and therapeutic regimen to maintain health will improve  Short Term Goals: Ability to remain free from injury will improve, Ability to verbalize frustration and anger appropriately will improve, Ability to participate in decision making will improve, Ability to verbalize feelings will improve, Ability to identify and develop effective coping behaviors will improve, and Compliance with prescribed medications will improve  Medication Management: RN will administer medications as ordered by provider, will assess and evaluate patient's response and provide education to patient for prescribed medication. RN will report any adverse and/or side effects to prescribing provider.  Therapeutic Interventions: 1 on 1 counseling sessions, Psychoeducation, Medication administration, Evaluate responses to treatment, Monitor vital signs and CBGs as ordered, Perform/monitor CIWA, COWS, AIMS  and Fall Risk screenings as ordered, Perform wound care treatments as ordered.  Evaluation of Outcomes: Progressing   LCSW Treatment Plan for Primary Diagnosis: Schizophrenia (HCC) Long Term Goal(s): Safe transition to appropriate next level of care at discharge, Engage patient in therapeutic group addressing interpersonal concerns.  Short Term Goals: Engage patient in aftercare planning with referrals and resources, Increase ability to appropriately verbalize feelings, Facilitate acceptance of mental health diagnosis and concerns, and Identify triggers associated with mental health/substance abuse issues  Therapeutic Interventions: Assess for all discharge needs, 1 to 1 time with Social worker, Explore available resources and support systems, Assess for adequacy in community support network, Educate family and significant  other(s) on suicide prevention, Complete Psychosocial Assessment, Interpersonal group therapy.  Evaluation of Outcomes: Progressing   Progress in Treatment: Attending groups: No. Participating in groups: No. Taking medication as prescribed: Yes. Toleration medication: Yes. Family/Significant other contact made: No, will contact:  consents pending Patient understands diagnosis: No. Discussing patient identified problems/goals with staff: No. Medical problems stabilized or resolved: Yes. Denies suicidal/homicidal ideation: Yes. Issues/concerns per patient self-inventory: No.   New problem(s) identified: No, Describe:  none reported   New Short Term/Long Term Goal(s): medication stabilization, elimination of SI thoughts, development of comprehensive mental wellness plan.      Patient Goals:  Pt unable to participate in treatment team, experiencing psychosis   Discharge Plan or Barriers: Patient recently admitted. CSW will continue to follow and assess for appropriate referrals and possible discharge planning.      Reason for Continuation of Hospitalization: Delusions  Hallucinations Medication stabilization   Estimated Length of Stay: 4 - 6 days  Last 3 [place] Suicide Severity Risk Score: Flowsheet Row Admission (Current) [facility] from 09/29/2023 in [facility] ED from 01/31/2021 in [facility]  C-SSRS RISK CATEGORY Low Risk No Risk Error: Q6 is Yes, you must answer 7       Last PHQ 2/9 Scores:    01/31/2021   11:44 AM  Depression screen PHQ 2/9  Decreased Interest 3  Down, Depressed, Hopeless 3  PHQ - 2 Score 6  Altered sleeping 3  Tired, decreased energy 3  Change in appetite 3  Feeling bad or failure about yourself  3  Trouble concentrating 3  Moving slowly or fidgety/restless 3  Suicidal thoughts 3  PHQ-9 Score 27  Difficult doing  work/chores Somewhat difficult    Scribe for Treatment Team: Nyra Jabs 10/09/2023 6:57 PM

## 2023-10-09 NOTE — Plan of Care (Signed)
   Problem: Education: Goal: Emotional status will improve Outcome: Not Progressing Goal: Mental status will improve Outcome: Not Progressing   Problem: Activity: Goal: Interest or engagement in activities will improve Outcome: Not Progressing

## 2023-10-10 DIAGNOSIS — F201 Disorganized schizophrenia: Secondary | ICD-10-CM | POA: Diagnosis not present

## 2023-10-10 MED ORDER — RIVASTIGMINE TARTRATE 1.5 MG PO CAPS
1.5000 mg | ORAL_CAPSULE | Freq: Two times a day (BID) | ORAL | Status: DC
  Filled 2023-10-10 (×4): qty 1
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
## 2023-10-10 NOTE — Plan of Care (Signed)
   Problem: Education: Goal: Emotional status will improve Outcome: Progressing Goal: Mental status will improve Outcome: Progressing Goal: Verbalization of understanding the information provided will improve Outcome: Progressing

## 2023-10-10 NOTE — Progress Notes (Signed)
   10/10/23 0755  Psych Admission Type (Psych Patients Only)  Admission Status Involuntary  Psychosocial Assessment  Patient Complaints Anxiety  Eye Contact Fair  Facial Expression Anxious  Affect Preoccupied  Speech Tangential  Interaction Assertive  Motor Activity Slow  Appearance/Hygiene In scrubs  Behavior Characteristics Appropriate to situation  Mood Preoccupied;Anxious  Thought Process  Coherency Disorganized  Content Delusions  Delusions None reported or observed  Perception Derealization  Hallucination None reported or observed  Judgment Limited  Confusion None  Danger to Self  Current suicidal ideation? Denies  Danger to Others  Danger to Others None reported or observed

## 2023-10-10 NOTE — Group Note (Signed)
 Date:  10/10/2023 Time:  8:43 PM  Group Topic/Focus:  Wrap-Up Group:   The focus of this group is to help patients review their daily goal of treatment and discuss progress on daily workbooks.    Participation Level:  Active  Participation Quality:  Appropriate  Affect:  Appropriate  Cognitive:  Appropriate  Insight: Appropriate  Engagement in Group:  Engaged  Modes of Intervention:  Education and Exploration  Additional Comments:  Patient attended and participated in group tonight. He reports that te best part of his day is making sure he is on the right side at all times.  Lita Mains [facility] 10/10/2023, 8:43 PM

## 2023-10-10 NOTE — Plan of Care (Signed)
   Problem: Education: Goal: Emotional status will improve Outcome: Not Progressing Goal: Mental status will improve Outcome: Not Progressing

## 2023-10-10 NOTE — Plan of Care (Signed)
  Problem: Education: Goal: Emotional status will improve Outcome: Progressing Goal: Mental status will improve Outcome: Progressing   Problem: Coping: Goal: Ability to demonstrate self-control will improve Outcome: Progressing

## 2023-10-10 NOTE — [facility] Group Notes (Signed)
 Adult Psychoeducational Group Note  Date:  10/10/2023 Time:  6:56 PM  Group Topic/Focus:  Goals Group:   The focus of this group is to help patients establish daily goals to achieve during treatment and discuss how the patient can incorporate goal setting into their daily lives to aide in recovery. Orientation:   The focus of this group is to educate the patient on the purpose and policies of crisis stabilization and provide a format to answer questions about their admission.  The group details unit policies and expectations of patients while admitted.  Participation Level:  Active  Participation Quality:  Appropriate  Affect:  Appropriate  Cognitive:  Appropriate  Insight: Appropriate  Engagement in Group:  Engaged  Modes of Intervention:  Discussion  Additional Comments:  Pt attended the goals group and remained appropriate and engaged throughout the duration of the group.   Sheran Lawless 10/10/2023, 6:56 PM

## 2023-10-11 DIAGNOSIS — F201 Disorganized schizophrenia: Secondary | ICD-10-CM | POA: Diagnosis not present

## 2023-10-11 NOTE — Progress Notes (Signed)
   10/11/23 1000  Psych Admission Type (Psych Patients Only)  Admission Status Involuntary  Psychosocial Assessment  Patient Complaints None  Eye Contact Brief  Facial Expression Flat  Affect Flat  Speech Logical/coherent  Interaction Assertive  Motor Activity Slow  Appearance/Hygiene In scrubs  Behavior Characteristics Cooperative;Appropriate to situation  Mood Pleasant  Thought Process  Coherency Disorganized  Content Delusions  Delusions None reported or observed  Perception Derealization  Hallucination None reported or observed  Judgment Impaired  Confusion Moderate  Danger to Self  Current suicidal ideation? Denies  Danger to Others  Danger to Others None reported or observed

## 2023-10-11 NOTE — Group Note (Signed)
 Date:  10/11/2023 Time:  8:43 PM  Group Topic/Focus:  Wrap-Up Group:   The focus of this group is to help patients review their daily goal of treatment and discuss progress on daily workbooks.    Participation Level:  Minimal  Participation Quality:  Redirectable and Resistant  Affect:  Flat  Cognitive:  Disorganized  Insight: Lacking and Limited  Engagement in Group:  Distracting, Off Topic, and Resistant  Modes of Intervention:  Discussion  Additional Comments:   Pt was unwilling to openly answer questions during group discussion. Pt met majority of writer questions with questions and unrelated commentary. Pt seems to have overwhelming issue feeling like the doctors and nurses aren't doing anything for him but pt wont go into detail about why. Pt refused to answer questions about his mental state throughout the day.   Vevelyn Pat 10/11/2023, 8:43 PM

## 2023-10-11 NOTE — Plan of Care (Signed)
  Problem: Education: Goal: Mental status will improve Outcome: Progressing   Problem: Activity: Goal: Interest or engagement in activities will improve Outcome: Progressing   Problem: Coping: Goal: Ability to verbalize frustrations and anger appropriately will improve Outcome: Progressing Goal: Ability to demonstrate self-control will improve Outcome: Progressing

## 2023-10-11 NOTE — Group Note (Signed)
 Date:  10/11/2023 Time:  8:53 AM  Group Topic/Focus:  Goals Group:   The focus of this group is to help patients establish daily goals to achieve during treatment and discuss how the patient can incorporate goal setting into their daily lives to aide in recovery. Orientation:   The focus of this group is to educate the patient on the purpose and policies of crisis stabilization and provide a format to answer questions about their admission.  The group details unit policies and expectations of patients while admitted.    Participation Level:  Minimal  Participation Quality:  Resistant  Affect:  Defensive  Cognitive:  Disorganized  Insight: Lacking  Engagement in Group:  Distracting  Modes of Intervention:  Discussion  Additional Comments:  Pt did not share his goal.   Lucilla Edin 10/11/2023, 8:53 AM
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
## 2023-10-11 NOTE — Plan of Care (Signed)
  Problem: Education: Goal: Mental status will improve Outcome: Progressing   Problem: Activity: Goal: Interest or engagement in activities will improve Outcome: Progressing   Problem: Coping: Goal: Ability to verbalize frustrations and anger appropriately will improve Outcome: Progressing

## 2023-10-11 NOTE — Progress Notes (Signed)
   10/11/23 1955  Psych Admission Type (Psych Patients Only)  Admission Status Involuntary  Psychosocial Assessment  Patient Complaints None  Eye Contact Brief  Facial Expression Flat  Affect Flat  Speech Logical/coherent  Interaction Assertive  Motor Activity Slow  Appearance/Hygiene In scrubs  Behavior Characteristics Cooperative;Appropriate to situation;Calm  Mood Pleasant  Thought Process  Coherency Disorganized  Content Delusions  Delusions None reported or observed  Perception Derealization  Hallucination None reported or observed  Judgment Impaired  Confusion None  Danger to Self  Current suicidal ideation? Denies  Danger to Others  Danger to Others None reported or observed

## 2023-10-12 ENCOUNTER — Other Ambulatory Visit (HOSPITAL_COMMUNITY): Payer: Self-pay

## 2023-10-12 ENCOUNTER — Telehealth (HOSPITAL_COMMUNITY): Payer: Self-pay | Admitting: Pharmacy Technician

## 2023-10-12 DIAGNOSIS — F201 Disorganized schizophrenia: Secondary | ICD-10-CM | POA: Diagnosis not present

## 2023-10-12 LAB — VITAMIN B1: Vitamin B1 (Thiamine): 116.3 nmol/L (ref 66.5–200.0)

## 2023-10-12 MED ORDER — PALIPERIDONE PALMITATE ER 234 MG/1.5ML IM SUSY: 234.0000 mg | PREFILLED_SYRINGE | Freq: Once | INTRAMUSCULAR | Status: DC

## 2023-10-12 MED ORDER — RISPERIDONE 1 MG/ML PO SOLN
3.0000 mg | Freq: Two times a day (BID) | ORAL | Status: AC
  Administered 2023-10-13 (×2): 3 mg via ORAL
  Filled 2023-10-12 (×3): qty 3

## 2023-10-12 MED ORDER — RISPERIDONE 1 MG/ML PO SOLN
2.0000 mg | Freq: Two times a day (BID) | ORAL | Status: AC
  Administered 2023-10-14 – 2023-10-15 (×4): 2 mg via ORAL
  Filled 2023-10-12 (×5): qty 2

## 2023-10-12 MED ORDER — GABAPENTIN 100 MG PO CAPS
100.0000 mg | ORAL_CAPSULE | Freq: Three times a day (TID) | ORAL | Status: DC
Start: 2023-10-12 — End: 2023-10-21
  Administered 2023-10-13 – 2023-10-21 (×22): 100 mg via ORAL
  Filled 2023-10-12 (×29): qty 1

## 2023-10-12 MED ORDER — RISPERIDONE 1 MG/ML PO SOLN
1.0000 mg | Freq: Two times a day (BID) | ORAL | Status: AC
  Administered 2023-10-16 – 2023-10-17 (×4): 1 mg via ORAL
  Filled 2023-10-12 (×4): qty 1

## 2023-10-12 MED ORDER — RIVASTIGMINE TARTRATE 1.5 MG PO CAPS
1.5000 mg | ORAL_CAPSULE | Freq: Two times a day (BID) | ORAL | Status: DC
  Administered 2023-10-13 – 2023-10-21 (×16): 1.5 mg via ORAL
  Filled 2023-10-12 (×20): qty 1

## 2023-10-12 MED ORDER — RISPERIDONE 1 MG/ML PO SOLN
0.5000 mg | Freq: Two times a day (BID) | ORAL | Status: AC
  Administered 2023-10-18 – 2023-10-19 (×4): 0.5 mg via ORAL
  Filled 2023-10-12 (×4): qty 0.5

## 2023-10-12 NOTE — Plan of Care (Signed)
  Problem: Education: Goal: Knowledge of  General Education information/materials will improve Outcome: Progressing Goal: Emotional status will improve Outcome: Progressing Goal: Mental status will improve Outcome: Progressing  Patient refused HS medications stating he does not need any medications. Medication education and compliance provided but Pt was not very receptive. Provider notified. Support and encouragement provided. Patient able to attend group interacting well with Peers and Staff.  Q 15 minutes safety checks ongoing.

## 2023-10-12 NOTE — Group Note (Signed)
 Date:  10/12/2023 Time:  9:09 PM  Group Topic/Focus:  Wrap-Up Group:   The focus of this group is to help patients review their daily goal of treatment and discuss progress on daily workbooks.    Participation Level:  Active  Participation Quality:  Appropriate  Affect:  Appropriate  Cognitive:  Appropriate  Insight: Appropriate  Engagement in Group:  Engaged  Modes of Intervention:  Education and Exploration  Additional Comments:  Patient attended and participated in group tonight.  He reports that his goal for today was to find a resolution in a circle.    Lita Mains [facility] 10/12/2023, 9:09 PM

## 2023-10-12 NOTE — Plan of Care (Signed)
 Pt refused scheduled injection medication and scheduled 5pm meds. Dr. Sherron Flemings notified. Importance of med compliance discussed with pt. Pt unable to verbalize or demonstrate understanding.

## 2023-10-12 NOTE — Progress Notes (Signed)
   10/12/23 0801  Psych Admission Type (Psych Patients Only)  Admission Status Involuntary  Psychosocial Assessment  Patient Complaints None  Eye Contact Brief  Facial Expression Flat  Affect Flat  Speech Logical/coherent  Interaction Assertive  Motor Activity Slow  Appearance/Hygiene In scrubs  Behavior Characteristics Cooperative  Mood Pleasant  Thought Process  Coherency Disorganized  Content Ambivalence  Delusions Controlled  Perception Derealization  Hallucination None reported or observed  Judgment Impaired  Confusion None  Danger to Self  Current suicidal ideation? Denies  Danger to Others  Danger to Others None reported or observed   When asked about the last time pt has had a bowel movement, pt replies, "I let the birds do it for me."

## 2023-10-12 NOTE — Group Note (Signed)
 LCSW Group Therapy Note   Group Date: 10/12/2023 Start Time: 1300 End Time: 1400   Participation:  patient was present and participated in the discussion.  His comments were off-topic.   Type of Therapy:  Group Therapy    Title:  Understanding Your Path to Change   Objective:  The goal is to help individuals understand the stages of change, identify where they currently are in the process, and provide actionable next steps to continue moving forward in their journey of change.   Goals: Learn about the six stages of change:  Precontemplation, Contemplation, Preparation, Action, Maintenance, and Relapse Reflect on Current Change Efforts:  Recognize which stage participants are in regarding a personal change. Plan Next Steps for Moving Forward:  Create an action plan based on their current stage of change.   Class Summary:  In this session, we explored the Stages of Change as a framework to understand the process of change.  We discussed how each stage helps individuals recognize where they are in their personal journey and used the Stages of Change Worksheet for self-reflection. Participants answered questions to better understand their current stage, challenges, and progress. We also emphasized the importance of moving forward, even if setbacks (Relapse) occur, and created actionable steps to help participants continue progressing. By the end of the session, participants gained a clearer understanding of their path to change and left with a clear plan for next steps.   Therapeutic Modalities:  Elements of CBT (cognitive restructuring, problem solving)  Element of DBT (mindfulness, distress tolerance)    Emanuela Runnion O Stormy Connon, LCSWA 10/12/2023  5:54 PM
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
## 2023-10-12 NOTE — Progress Notes (Signed)
 Total Time Spent in Direct Patient Care:  I personally spent 35 minutes on the unit in direct patient care. The direct patient care time included face-to-face time with the patient, reviewing the patient's chart, communicating with other professionals, and coordinating care. Greater than 50% of this time was spent in counseling or coordinating care with the patient regarding goals of hospitalization, psycho-education, and discharge planning needs.  I have independently evaluated the patient during a face-to-face assessment. I reviewed the patient's chart, and I participated in key portions of the service. I discussed the case with the [employer], and I agree with the assessment and plan of care as documented in the House Officer's note, as addended by me or notated below:  More linear today. Still A&Ox1. LG gives consent to start medications as in resident note. Pt refused LAI after LG gives consent. LG needs to be contacted to ask if they want the LAI to be administered over objection, which I recommend as the pt's med nonadherence was a significant contributing factor to his decompensation and being admitted.   Anthony Inches, MD Psychiatrist    [facility] MD Progress Note  10/12/2023 2:16 PM Anthony Knox  MRN:  [id number]  Principal Problem: Schizophrenia [facility]) Diagnosis: Principal Problem:   Schizophrenia (HCC)  Reason for Admission:  Anthony Knox is a 63 y.o., male with a past psychiatric history significant for schizoaffective disorder, bipolar type who presents to the [facility] Involuntary from [facility] for evaluation and management of agitation in his group home and psychosis  (admitted on 10/03/2023, total  LOS: 9 days )  On assessment today, the patient slightly more linearity and thought process than yesterday. Confabulation and confusion less today. Thoughts are less disorganized over the last 4 to 5 days.  Patient only slept 3 hours per nursing report.  When asked about  his morning he states that he " I'm get through it".  Otherwise he denies any AH or VH and does not appear to be responding to internal stimuli.  He continues to pleasant with this provider, was argumentative with a nurse after spilling water on the floor this morning.  He is taking medications and following the unit rules. Believe he is back to his psychiatric baseline.  Denies any SI or HI.  Denies any side effects to his psychiatric medication.  Collateral Information  Legal Guardian [name], [name] - legal guardian [phone] , dial "[phone]" for direct extension   10/12/23 Amenable with transitioning from oral to long acting injectable of risperidone. She is aware of possible need to gradually taper off oral risperidone after receiving injection. Also amenable with starting rivastigmine for suspected neurocognitive disorder and gabapentin for pain/irritability. Will continue to discuss dispo planning with group home prior to discharge.   10/09/23  Patient was previously discharged from hospitalization December 2024. She moved to his current group home in January, did fine for a few weeks and then started to decline. He was mean, irritable, argumentative, would make racial comments and was intermittently non-compliant with medications. He would manipulative and take meds whenever he wanted something from the staff. At his baseline he doesn't answer questions directly and answers in riddles. She suspects that he attempts to be preformative and won't answer others questions appropriately. She desires the patient to be on a long acting injectable given his prior history of medical non-compliance. She just recently picked up his case this year. She requests that primary team contact her prior to engaging with  group home. She hasn't been able to speak with the group home and was unaware of the patient's admission.     Hospital Discharge meds from December 2024 include:   0.5 mg benztropine daily    Risperidone 200 mg q8w   Topamax 100 mg at bedtime   Trazodone 150 mg at bedtime    Loratadine 10 mg at bedtime   Senna 8.6 mg daily   Past Psychiatric History:  Previous psychiatric diagnoses: schizoaffective d/o Prior psychiatric treatment: collateral is unsure Psychiatric medication compliance history: poor   Current psychiatric treatment: collateral is unsure Current psychiatrist: collateral is unsure Current therapist: collateral is unsure   Previous hospitalizations yes, previously in [facility] for 8 months; per chart review was in [facility] in 2019 History of suicide attempts: denies History of self harm: denies Past Medical History:  Past Medical History:  Diagnosis Date   Schizophrenia (HCC)     Family Psychiatric History:  Psych: brother committed suicide, other brother has ADHD and bipolar disorder   Social History:  Housing: lives in [place] group home Education: finished 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 Weapons: denies  Current Medications: Current Facility-Administered Medications  Medication Dose Route Frequency Provider Last Rate Last Admin   acetaminophen (TYLENOL) tablet 650 mg  650 mg Oral Q8H Peterson Ao, MD   650 mg at 10/12/23 1350   alum & mag hydroxide-simeth (MAALOX/MYLANTA) 200-200-20 MG/5ML suspension 30 mL  30 mL Oral Q4H PRN Ophelia Shoulder E, NP       haloperidol (HALDOL) tablet 5 mg  5 mg Oral TID PRN Chales Abrahams, NP       And   diphenhydrAMINE (BENADRYL) capsule 50 mg  50 mg Oral TID PRN Ophelia Shoulder E, NP       haloperidol lactate (HALDOL) injection 5 mg  5 mg Intramuscular TID PRN Chales Abrahams, NP       And   diphenhydrAMINE (BENADRYL) injection 50 mg  50 mg Intramuscular TID PRN Chales Abrahams, NP       And   LORazepam (ATIVAN) injection 2 mg  2 mg Intramuscular TID PRN Ophelia Shoulder E, NP       haloperidol lactate (HALDOL) injection 10 mg  10 mg Intramuscular TID PRN Chales Abrahams, NP        And   diphenhydrAMINE (BENADRYL) injection 50 mg  50 mg Intramuscular TID PRN Chales Abrahams, NP       And   LORazepam (ATIVAN) injection 2 mg  2 mg Intramuscular TID PRN Ophelia Shoulder E, NP       docusate sodium (COLACE) capsule 100 mg  100 mg Oral Daily Peterson Ao, MD   100 mg at 10/12/23 0801   hydrOXYzine (ATARAX) tablet 25 mg  25 mg Oral TID PRN Kizzie Ide B, MD   25 mg at 10/08/23 2039   ibuprofen (ADVIL) tablet 400 mg  400 mg Oral Q4H PRN Peterson Ao, MD       magnesium hydroxide (MILK OF MAGNESIA) suspension 30 mL  30 mL Oral Daily PRN Ophelia Shoulder E, NP       risperiDONE (RISPERDAL) 1 MG/ML oral solution 3 mg  3 mg Oral Q12H Nealie Mchatton, MD   3 mg at 10/12/23 0801   senna (SENOKOT) tablet 8.6 mg  1 tablet Oral Daily Peterson Ao, MD   8.6 mg at 10/12/23 0801   traZODone (DESYREL) tablet 100 mg  100 mg Oral QHS PRN  Peterson Ao, MD   100 mg at 10/11/23 2132    Lab Results:  No results found for this or any previous visit (from the past 48 hours).   Blood Alcohol level:  Lab Results  Component Value Date   ETH <10 09/29/2023   ETH <10 11/28/2021    Metabolic Labs: Lab Results  Component Value Date   HGBA1C 5.6 10/05/2023   MPG 114.02 10/05/2023   MPG 123 12/07/2020   Lab Results  Component Value Date   PROLACTIN 34.1 (H) 05/08/2018   Lab Results  Component Value Date   CHOL 162 10/05/2023   TRIG 54 10/05/2023   HDL 47 10/05/2023   CHOLHDL 3.4 10/05/2023   VLDL 11 10/05/2023   LDLCALC 104 (H) 10/05/2023   LDLCALC 127 (H) 12/07/2020    Physical Findings: AIMS: No . No EPS noted on exam.   CIWA:    COWS:     Psychiatric Specialty Exam: General Appearance: Appropriate for Environment; Casual   Eye Contact: Fair   Speech: Normal Rate   Volume: Normal   Mood: -- ("I'm making it")   Affect: Constricted   Thought Content: Other (comment) (more logical, patient speaks in riddles at baseline)   Suicidal Thoughts: Suicidal  Thoughts: No    Homicidal Thoughts: Homicidal Thoughts: No    Thought Process: Other (comment) (More linear and less disorganized compared to prior interview)   Orientation: Partial     Memory: Immediate Fair; Recent Poor; Remote Poor   Judgment: Poor   Insight: Lacking   Concentration: Fair   Recall: Poor   Fund of Knowledge: Poor   Language: Fair   Psychomotor Activity: Psychomotor Activity: Normal    Assets: Housing   Sleep: Sleep: Poor Number of Hours of Sleep: 3     Review of Systems Review of Systems  Constitutional:  Negative for chills and fever.  Respiratory:  Negative for cough.   Gastrointestinal:  Negative for constipation, nausea and vomiting.  Neurological:  Negative for headaches.    Vital Signs: Blood pressure (!) 151/89, pulse (!) 58, temperature 97.8 F (36.6 C), temperature source Oral, resp. rate 20, height 5\' 6"  (1.676 m), weight 66.2 kg, SpO2 99%. Body mass index is 23.57 kg/m. Physical Exam Constitutional:      Appearance: Normal appearance.  Eyes:     Conjunctiva/sclera: Conjunctivae normal.  Pulmonary:     Effort: Pulmonary effort is normal.  Musculoskeletal:        General: Normal range of motion.  Neurological:     Mental Status: He is alert and oriented to person, place, and time.  Psychiatric:        Attention and Perception: He does not perceive auditory or visual hallucinations.        Mood and Affect: Affect is not angry.        Speech: Speech is tangential. Speech is not rapid and pressured.        Behavior: Behavior is withdrawn. Behavior is not aggressive or combative. Behavior is cooperative.        Thought Content: Thought content is delusional. Thought content is not paranoid. Thought content does not include homicidal or suicidal ideation. Thought content does not include homicidal or suicidal plan.    Assets  Assets: Housing  Treatment Plan Summary: Daily contact with patient to assess and evaluate symptoms  and progress in treatment and Medication management  Diagnoses / Active Problems: Schizophrenia (HCC) R/o Dementia   ASSESSMENT: JAHAZIEL FRANCOIS is a 63  y.o., male with a past psychiatric history significant for schizoaffective disorder, bipolar type who presents to the [facility] Involuntary from [facility] for evaluation and management of agitation in his group home and psychosis.    Patient is giving bizarre, nonsensical responses when asking him questions. During my interaction, he was not agitation but per ED notes, he was apparently agitated in the group home. Unfortunately, the group home is not answering calls and the mailbox to leave a voicemail is full. Patient's brother states baseline is patient being calm. He does not know when patient last received Invega or what medications he was on prior to admission. He recalls that Hinda Glatter may have helped with patient's symptoms so we will start Risperdal and increase until we see improvement. Patient's brother plans on visiting and letting us know how he feels the patient is improving throughout his hospitalization. We will also put in a [facility] referral as pt was hospitalized for 8 months at [facility] in the past.   PLAN: Safety and Monitoring:             -- Involuntary admission to inpatient psychiatric unit for safety, stabilization and treatment             -- Daily contact with patient to assess and evaluate symptoms and progress in treatment             -- Patient's case to be discussed in multi-disciplinary team meeting             -- Observation Level : q15 minute checks             -- Vital signs:  q12 hours             -- Precautions: suicide, elopement, and assault   2. Medications:               Psychiatric Diagnosis and Treatment Schizoaffective disorder, bipolar type - Give Risperdal 234 IM x1 on 3/31 after discussion with legal guardian,  -  Gradually taper off PO Risperdal 3 mg q12h daily x 3 more doses, received morning  dose >   2 mg q12h for x4 doses, 1 mg q12h x4 doses and 0.5 mg q12h x 4 doses   - Start Rivastigmine 1.5 mg twice daily for major neurocognitive disorder  - Start Gabapentin 100 mg three times daily for pain/irritability  -Continue Trazodone 100 mg at bedtime as needed for insomnia -Atarax 25 mg TID as needed for anxiety - Ammonia lab normal  - F/u CT report no acute pathology - Vitamin B1 processing  - Vitamin B12 normal and CMP unremarkable  - Agitation Protocol: Haldol, Benadryl, Ativan   Medical Diagnosis and Treatment None   Other as needed medications  Tylenol 650 mg every 6 hours as needed for pain Mylanta 30 mL every 4 hours as needed for indigestion Milk of magnesia 30 mL daily as needed for constipation   The risks/benefits/side-effects/alternatives to the above medication were discussed in detail with the patient and time was given for questions. The patient consents to medication trial. FDA black box warnings, if present, were discussed.   The patient is agreeable with the medication plan, as above. We will monitor the patient's response to pharmacologic treatment, and adjust medications as necessary.  3. Routine and other pertinent labs:             -- Metabolic profile:  BMI: Body mass index is 23.57 kg/m.  Prolactin: Lab Results  Component Value Date  PROLACTIN 34.1 (H) 05/08/2018    Lipid Panel: Lab Results  Component Value Date   CHOL 162 10/05/2023   TRIG 54 10/05/2023   HDL 47 10/05/2023   CHOLHDL 3.4 10/05/2023   VLDL 11 10/05/2023   LDLCALC 104 (H) 10/05/2023   LDLCALC 127 (H) 12/07/2020    HbgA1c: Hgb A1c MFr Bld (%)  Date Value  10/05/2023 5.6    TSH: TSH (uIU/mL)  Date Value  10/03/2023 4.287  07/15/2010 6.293 (H)   EKG monitoring: QTc: 463 w/ PVC  CT final read no acute pathology  4. Group Therapy:             -- Encouraged patient to participate in unit milieu and in scheduled group therapies              -- Short Term Goals:  Ability to identify changes in lifestyle to reduce recurrence of condition will improve, Ability to verbalize feelings will improve, Ability to disclose and discuss suicidal ideas, Ability to demonstrate self-control will improve, Ability to identify and develop effective coping behaviors will improve, Ability to maintain clinical measurements within normal limits will improve, Compliance with prescribed medications will improve, and Ability to identify triggers associated with substance abuse/mental health issues will improve             -- Long Term Goals: Improvement in symptoms so as ready for discharge -- Patient is encouraged to participate in group therapy while admitted to the psychiatric unit. -- We will address other chronic and acute stressors, which contributed to the patient's Schizophrenia (HCC) in order to reduce the risk of self-harm at discharge.   5. Discharge Planning:              -- Social work and case management to assist with discharge planning and identification of hospital follow-up needs prior to discharge             -- Estimated LOS: Possible discharge Wednesday              -- Discharge Concerns: Need to establish a safety plan; Medication compliance and effectiveness             -- Discharge Goals: Return home with outpatient referrals for mental health follow-up including medication management/psychotherapy   I certify that inpatient services furnished can reasonably be expected to improve the patient's condition.    Signed: Peterson Ao, MD 10/12/2023, 2:16 PM

## 2023-10-12 NOTE — Telephone Encounter (Signed)
 Patient Product/process development scientist completed.    The patient is insured through [facility]. Patient has Medicare and is not eligible for a copay card, but may be able to apply for patient assistance or Medicare RX Payment Plan (Patient Must reach out to their plan, if eligible for payment plan), if available.    Ran test claim for Invega 234 mg and the current 30 day co-pay is $0.00.  Ran test claim for Uzedy 100 mg and Product not covered  This test claim was processed through [employer]- copay amounts may vary at other pharmacies due to [employer], or as the patient moves through the different stages of their insurance plan.     Roland Earl, CPHT Pharmacy Technician III Certified Patient Advocate [facility] Patient Advocate Team Direct Number: [phone]  Fax: [phone]

## 2023-10-13 DIAGNOSIS — F250 Schizoaffective disorder, bipolar type: Secondary | ICD-10-CM | POA: Diagnosis not present

## 2023-10-13 NOTE — Group Note (Signed)
 Date:  10/13/2023 Time:  9:14 PM  Group Topic/Focus:  Wrap-Up Group:   The focus of this group is to help patients review their daily goal of treatment and discuss progress on daily workbooks.    Participation Level:  Active  Participation Quality:  Appropriate  Affect:  Appropriate  Cognitive:  Appropriate  Insight: Appropriate  Engagement in Group:  Engaged  Modes of Intervention:  Discussion  Additional Comments:   Pt states that he's had a much better day than normal and went into detail about how his mindset is beginning to be more positive. Pt states he participated in programming and recreational activities. Pt denies everything   Vevelyn Pat 10/13/2023, 9:14 PM

## 2023-10-13 NOTE — Progress Notes (Signed)
 Collateral contact - [name] - legal guardian - [phone] x [phone]   Legal guardian said that the group home is located in [place].  Someone from the group home will visit on Wednesday or Thursday to assess how patient is doing compared to his baseline.  She wasn't sure where patient was receiving services (psychiatrist and therapist) but will ask the group home.    CSW sent her the ROI to legal guardian [email or web address].     Franco Duley, LCSWA 10/13/2023

## 2023-10-13 NOTE — Progress Notes (Signed)
   10/13/23 0805  Psych Admission Type (Psych Patients Only)  Admission Status Involuntary  Psychosocial Assessment  Patient Complaints Restlessness  Eye Contact Brief  Facial Expression Animated  Affect Appropriate to circumstance  Speech Logical/coherent  Interaction Assertive  Motor Activity Slow  Appearance/Hygiene In scrubs  Behavior Characteristics Appropriate to situation  Mood Pleasant  Thought Process  Coherency Disorganized  Content Ambivalence  Delusions Controlled  Perception Derealization  Hallucination None reported or observed  Judgment Limited  Confusion None  Danger to Self  Current suicidal ideation? Denies  Agreement Not to Harm Self Yes  Description of Agreement Verbal  Danger to Others  Danger to Others None reported or observed

## 2023-10-13 NOTE — Group Note (Signed)
 Date:  10/13/2023 Time:  9:53 AM  Group Topic/Focus:  Coping With Mental Health Crisis:   The purpose of this group is to help patients identify strategies for coping with mental health crisis.  Group discusses possible causes of crisis and ways to manage them effectively. Emotional Education:   The focus of this group is to discuss what feelings/emotions are, and how they are experienced. Goals Group:   The focus of this group is to help patients establish daily goals to achieve during treatment and discuss how the patient can incorporate goal setting into their daily lives to aide in recovery.    Participation Level:  Did not attend  Participation Quality:    Affect:    Cognitive:    Insight:   Engagement in Group:    Modes of Intervention:    Additional Comments:    Anthony Knox 10/13/2023, 9:53 AM

## 2023-10-13 NOTE — Progress Notes (Signed)
 Anthony Knox  10/13/2023 11:08 AM Anthony Knox  MRN:  [id number]  Principal Problem: Schizophrenia (HCC) Diagnosis: Principal Problem:   Schizophrenia (HCC)   Reason for Admission:  Anthony Knox is a 63 y.o., male with a past psychiatric history significant for schizoaffective disorder, bipolar type admitted involuntarily to the [facility] from [facility] for evaluation and management of agitation in his group home and psychosis. (Patient was admitted on 10/03/2023, total  LOS: 10 days )  Chart Review from last 24 hours:  The patient's chart was reviewed and nursing notes were reviewed. The patient's case was discussed in multidisciplinary team meeting.   - Overnight events to report per chart review / staff report: no notable overnight events to report - Patient received all scheduled medications except: paliperidone IM - Patient did not receive any PRN medications  Information Obtained Today During Patient Interview: The patient was seen and evaluated on the unit. On assessment today the patient reported doing okay. He stated his mood as "upbeat as far as I can make it." He presented A&O x1 only to situation. He reported that over the last 24 hours he has had an upset stomach and has been feeling agitated. He reported attending group sessions when he can and when "they speak [place]." He reported sleeping off and on last night for a total of 2 minutes and 12 seconds. He endorsed taking the morning easy. He reported his anxiety as 5.5/10 (with 10 being the most severe) because he didn't know how to talk to others. He reported his depression as "newly finished on ending a situation positive." He denied any SI, HI, and AVH. He endorsed delusions, specifically of the nurses being out to get him. He stated that he felt they were out to get him because they would not answer his questions and were rude to him. He reported not having any energy in the last 24 hours and  stated that he has pain on both sides of his rib cage where "they put the esophaguses." He did not respond to questions about beneficial effects from his medication, but did report having a dry mouth and feeling thirsty recently. When asked about his appetite and hydration he reported "every bite I take is a step taken backwards through forgetting the necessities of life." He endorsed having a bowel movement recently, but could not remember it was last night or the day before. He denied having talked to any one recently. He reported being open to receiving the medication injection he refused yesterday if the nurses agreed to answer his questions about the medication.  Collateral Information (Previously obtained by prior personnel)  Legal Guardian Mr. Anthony Knox, Anthony Knox - legal guardian [phone] , dial "4" for direct extension    10/12/23 Amenable with transitioning from oral to long acting injectable of risperidone. She is aware of possible need to gradually taper off oral risperidone after receiving injection. Also amenable with starting rivastigmine for suspected neurocognitive disorder and gabapentin for pain/irritability. Will continue to discuss dispo planning with group home prior to discharge.    10/09/23  Patient was previously discharged from hospitalization December 2024. She moved to his current group home in January, did fine for a few weeks and then started to decline. He was mean, irritable, argumentative, would make racial comments and was intermittently non-compliant with medications. He would manipulative and take meds whenever he wanted something from the staff. At his baseline he doesn't answer questions directly and answers  in riddles. She suspects that he attempts to be preformative and won't answer others questions appropriately. She desires the patient to be on a long acting injectable given his prior history of medical non-compliance. She just recently picked up his case this  year. She requests that primary team contact her prior to engaging with group home. She hasn't been able to speak with the group home and was unaware of the patient's admission.      Hospital Discharge meds from December 2024 include:    0.5 mg benztropine daily    Risperidone 200 mg q8w    Topamax 100 mg at bedtime    Trazodone 150 mg at bedtime     Loratadine 10 mg at bedtime    Senna 8.6 mg daily  Past Psychiatric History: (All pertinent psychiatric, medical, family, and social history obtained from previous progress notes and H&P) Previous psychiatric diagnoses: schizoaffective d/o Prior psychiatric treatment: collateral is unsure Psychiatric medication compliance history: poor   Current psychiatric treatment: collateral is unsure Current psychiatrist: collateral is unsure Current therapist: collateral is unsure   Previous hospitalizations yes, previously in [facility] for 8 months; per chart review was in [facility] in 2019 History of suicide attempts: denies History of self harm: denies  Past Medical History:  Past Medical History:  Diagnosis Date   Schizophrenia [facility])     Family Psychiatric History:  Psych: brother committed suicide, other brother has ADHD and bipolar disorder   Social History:  Housing: lives in [facility] group home Education: finished 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 Weapons: denies  Current Medications: Current Facility-Administered Medications  Medication Dose Route Frequency Provider Last Rate Last Admin   acetaminophen (TYLENOL) tablet 650 mg  650 mg Oral Q8H Anthony Ao, MD   650 mg at 10/13/23 0615   alum & mag hydroxide-simeth (MAALOX/MYLANTA) 200-200-20 MG/5ML suspension 30 mL  30 mL Oral Q4H PRN Anthony Shoulder E, NP       haloperidol (HALDOL) tablet 5 mg  5 mg Oral TID PRN Anthony Abrahams, NP       And   diphenhydrAMINE (BENADRYL) capsule 50 mg  50 mg Oral TID PRN Anthony Shoulder E, NP       haloperidol lactate  (HALDOL) injection 5 mg  5 mg Intramuscular TID PRN Anthony Abrahams, NP       And   diphenhydrAMINE (BENADRYL) injection 50 mg  50 mg Intramuscular TID PRN Anthony Abrahams, NP       And   LORazepam (ATIVAN) injection 2 mg  2 mg Intramuscular TID PRN Anthony Shoulder E, NP       haloperidol lactate (HALDOL) injection 10 mg  10 mg Intramuscular TID PRN Anthony Shoulder E, NP       And   diphenhydrAMINE (BENADRYL) injection 50 mg  50 mg Intramuscular TID PRN Anthony Abrahams, NP       And   LORazepam (ATIVAN) injection 2 mg  2 mg Intramuscular TID PRN Anthony Shoulder E, NP       docusate sodium (COLACE) capsule 100 mg  100 mg Oral Daily Anthony Ao, MD   100 mg at 10/13/23 0825   gabapentin (NEURONTIN) capsule 100 mg  100 mg Oral TID Anthony Ao, MD   100 mg at 10/13/23 0825   hydrOXYzine (ATARAX) tablet 25 mg  25 mg Oral TID PRN Kizzie Ide B, MD   25 mg at 10/08/23 2039   ibuprofen (ADVIL) tablet 400 mg  400 mg Oral Q4H PRN Anthony Ao, MD       magnesium hydroxide (MILK OF MAGNESIA) suspension 30 mL  30 mL Oral Daily PRN Anthony Abrahams, NP       paliperidone (INVEGA SUSTENNA) injection 234 mg  234 mg Intramuscular Once Anthony Ao, MD       risperiDONE (RISPERDAL) 1 MG/ML oral solution 3 mg  3 mg Oral Q12H Anthony Ao, MD   3 mg at 10/13/23 8295   Followed by   [name] ON 10/14/2023] risperiDONE (RISPERDAL) 1 MG/ML oral solution 2 mg  2 mg Oral Q12H Anthony Ao, MD       Followed by   [name] ON 10/16/2023] risperiDONE (RISPERDAL) 1 MG/ML oral solution 1 mg  1 mg Oral Q12H Anthony Ao, MD       Followed by   [name] ON 10/18/2023] risperiDONE (RISPERDAL) 1 MG/ML oral solution 0.5 mg  0.5 mg Oral Q12H Anthony Ao, MD       rivastigmine (EXELON) capsule 1.5 mg  1.5 mg Oral BID Anthony Ao, MD   1.5 mg at 10/13/23 6213   senna (SENOKOT) tablet 8.6 mg  1 tablet Oral Daily Anthony Ao, MD   8.6 mg at 10/13/23 0825   traZODone (DESYREL) tablet 100 mg  100 mg Oral QHS PRN  Anthony Ao, MD   100 mg at 10/11/23 2132    Lab Results: No results found for this or any previous visit (from the past 48 hours).  Blood Alcohol level:  Lab Results  Component Value Date   ETH <10 09/29/2023   ETH <10 11/28/2021    Metabolic Labs: Lab Results  Component Value Date   HGBA1C 5.6 10/05/2023   MPG 114.02 10/05/2023   MPG 123 12/07/2020   Lab Results  Component Value Date   PROLACTIN 34.1 (H) 05/08/2018   Lab Results  Component Value Date   CHOL 162 10/05/2023   TRIG 54 10/05/2023   HDL 47 10/05/2023   CHOLHDL 3.4 10/05/2023   VLDL 11 10/05/2023   LDLCALC 104 (H) 10/05/2023   LDLCALC 127 (H) 12/07/2020    Physical Findings: AIMS: No  CIWA:    COWS:     Psychiatric Specialty Exam: General Appearance: Age-appropriate, disheveled male sitting in bed.   Eye Contact: Fair   Speech: Normal Rate and cooperative with interview   Volume: Normal   Mood: "Upbeat as far as I can make it"   Affect: Constricted and mood congruent   Thought Content: Patient started with logical thought content, but became more disorganized as the interview progressed.   Suicidal Thoughts: Suicidal Thoughts: No   Homicidal Thoughts: Homicidal Thoughts: No   Thought Process: Patient started with linear thought content, but became more tangential and disorganized during conversation.   Orientation: Alert and oriented x1 to situation     Memory: Immediate Fair; Recent Poor; Remote Poor   Judgment: Poor   Insight: Lacking   Concentration: Poor   Recall: Poor   Fund of Knowledge: Poor   Language: Fair   Psychomotor Activity: Normal   Assets: Housing   Sleep: Sleep: Reported off and on for 2 minutes and 12 seconds.    Review of Systems Review of Systems  Constitutional:  Positive for chills. Negative for diaphoresis and fever.  HENT:  Positive for sore throat and tinnitus.   Eyes:  Negative for blurred vision and double vision.  Respiratory:   Negative for cough, shortness of breath and wheezing.   Cardiovascular:  Negative for chest pain and palpitations.  Gastrointestinal:  Positive for constipation, diarrhea and nausea. Negative for vomiting.  Genitourinary:  Negative for dysuria.  Musculoskeletal:  Negative for myalgias.  Neurological:  Positive for dizziness and weakness.  Psychiatric/Behavioral:  Negative for hallucinations and suicidal ideas.     Vital Signs: Blood pressure 112/84, pulse 76, temperature 98.2 F (36.8 C), temperature source Oral, resp. rate 18, height 5\' 6"  (1.676 m), weight 66.2 kg, SpO2 99%. Body mass index is 23.57 kg/m. Physical Exam Eyes:     Conjunctiva/sclera: Conjunctivae normal.  Pulmonary:     Effort: Pulmonary effort is normal.  Musculoskeletal:        General: Normal range of motion.     Cervical back: Normal range of motion.  Neurological:     Mental Status: He is alert. He is disoriented.     Assets  Assets: Housing   Treatment Plan Summary: Daily contact with patient to assess and evaluate symptoms and progress in treatment and Medication management   ASSESSMENT: ALBEIRO TROMPETER is a 63 y.o., male with a past psychiatric history significant for schizoaffective disorder, bipolar type admitted involuntarily to the [facility] from [facility] for evaluation and management of agitation in his group home and psychosis. (Patient was admitted on 10/03/2023, total  LOS: 10 days )  On assessment, patient initially presented pleasant, organized, logical, and linear. As the conversation progressed, the patient presented disorganized, tangential, and nonsensical. When asked a majority of questions on interview, the patient provided questions or answers that did not match the questions originally asked. He continued to present disoriented to person, place, and time. He denied any SI, HI, and AVH, but did endorse delusions regarding the nurses having it out for him. He reported being open  to receiving the medication injection refused yesterday as long as the nurses answered his questions prior to treatment. We will continue to monitor patient's status, medication response, and side effects while consulting social work for possible referral [facility] at [facility].    DIAGNOSIS: Schizophrenia  PLAN: Safety and Monitoring:  -- Involuntary admission to inpatient psychiatric unit for safety, stabilization and treatment  -- Daily contact with patient to assess and evaluate symptoms and progress in treatment  -- Patient's case to be discussed in multi-disciplinary team meeting  -- Observation Level : q15 minute checks  -- Vital signs:  q12 hours  -- Precautions: suicide, elopement, and assault  2. Interventions (medications, psychoeducation, etc):               -- medical regimen:    -- Administer Paliperidone 234 mg IM x1 on 4/1 given prior discussion with legal guardian for schizophrenia   -- Continue tapering PO Risperdal 3 mg every 12 hours for 3 more doses starting 3/31, then 2 mg every 12 hours for 4 doses, then 1 mg every 12 hours for 4 doses, and then 0.5 mg every 12 hours for 4 doses -- Continue Rivastigmine 1.5 mg twice daily for major neurocognitive disorder   -- Continue Gabapentin 100 mg three times daily for pain/irritability     PRN medications for symptomatic management:              -- continue acetaminophen 650 mg every 6 hours as needed for mild to moderate pain, fever, and headaches              -- continue hydroxyzine 25 mg three times a day as needed for anxiety              --  continue bismuth subsalicylate 524 mg oral chewable tablet every 3 hours as needed for indigestion              -- continue senna 8.6 mg oral at bedtime as needed and polyethylene glycol 17 g oral daily as needed for mild to moderate constipation              -- continue ondansetron 8 mg every 8 hours as needed for nausea or vomiting              -- continue aluminum-magnesium hydroxide +  simethicone 30 mL every 4 hours as needed for heartburn              -- continue trazodone 100 mg at bedtime as needed for insomnia  -- As needed agitation protocol in-place  The risks/benefits/side-effects/alternatives to the above medication were discussed in detail with the patient and time was given for questions. The patient consents to medication trial. FDA black box warnings, if present, were discussed.  The patient is agreeable with the medication plan, as above. We will monitor the patient's response to pharmacologic treatment, and adjust medications as necessary.  3. Routine and other pertinent labs:             -- Metabolic profile:  BMI: Body mass index is 23.57 kg/m.  Prolactin: Lab Results  Component Value Date   PROLACTIN 34.1 (H) 05/08/2018    Lipid Panel: Lab Results  Component Value Date   CHOL 162 10/05/2023   TRIG 54 10/05/2023   HDL 47 10/05/2023   CHOLHDL 3.4 10/05/2023   VLDL 11 10/05/2023   LDLCALC 104 (H) 10/05/2023   LDLCALC 127 (H) 12/07/2020    HbgA1c: Hgb A1c MFr Bld (%)  Date Value  10/05/2023 5.6    TSH: TSH (uIU/mL)  Date Value  10/03/2023 4.287  07/15/2010 6.293 (H)    EKG monitoring: QTc: 399 w/PVC  4. Group Therapy:  -- Encouraged patient to participate in unit milieu and in scheduled group therapies   -- Short Term Goals: Ability to identify changes in lifestyle to reduce recurrence of condition, verbalize feelings, identify and develop effective coping behaviors, maintain clinical measurements within normal limits, and identify triggers associated with substance abuse/mental health issues will improve. Improvement in ability to disclose and discuss suicidal ideas, demonstrate self-control, and comply with prescribed medications.  -- Long Term Goals: Improvement in symptoms so as ready for discharge -- Patient is encouraged to participate in group therapy while admitted to the psychiatric unit. -- We will address other chronic  and acute stressors, which contributed to the patient's Schizophrenia (HCC) in order to reduce the risk of self-harm at discharge.  5. Discharge Planning:   -- Social work and case management to assist with discharge planning and identification of hospital follow-up needs prior to discharge  -- Estimated LOS: 7-10 days  -- Discharge Concerns: Need to establish a safety plan; Medication compliance and effectiveness  -- Discharge Goals: Return home with outpatient referrals for mental health follow-up including medication management/psychotherapy  I certify that inpatient services furnished can reasonably be expected to improve the patient's condition.   Signed: Janith Lima, Medical Student 10/13/2023, 11:08 AM

## 2023-10-13 NOTE — Plan of Care (Signed)
   Problem: Education: Goal: Emotional status will improve Outcome: Progressing Goal: Mental status will improve Outcome: Progressing Goal: Verbalization of understanding the information provided will improve Outcome: Progressing

## 2023-10-14 ENCOUNTER — Encounter (HOSPITAL_COMMUNITY): Payer: Self-pay

## 2023-10-14 DIAGNOSIS — F250 Schizoaffective disorder, bipolar type: Secondary | ICD-10-CM | POA: Diagnosis not present

## 2023-10-14 MED ORDER — PALIPERIDONE PALMITATE ER 156 MG/ML IM SUSY
156.0000 mg | PREFILLED_SYRINGE | Freq: Once | INTRAMUSCULAR | Status: DC
  Filled 2023-10-14: qty 1

## 2023-10-14 MED ORDER — PALIPERIDONE PALMITATE ER 234 MG/1.5ML IM SUSY
234.0000 mg | PREFILLED_SYRINGE | Freq: Once | INTRAMUSCULAR | Status: AC
  Administered 2023-10-15: 234 mg via INTRAMUSCULAR

## 2023-10-14 NOTE — Plan of Care (Signed)
   Problem: Education: Goal: Emotional status will improve Outcome: Progressing Goal: Mental status will improve Outcome: Progressing   Problem: Activity: Goal: Interest or engagement in activities will improve Outcome: Progressing   Problem: Coping: Goal: Ability to verbalize frustrations and anger appropriately will improve Outcome: Progressing

## 2023-10-14 NOTE — Progress Notes (Signed)
   10/14/23 2100  Psych Admission Type (Psych Patients Only)  Admission Status Involuntary  Psychosocial Assessment  Patient Complaints Suspiciousness  Eye Contact Fair  Facial Expression Flat  Affect Flat  Speech Logical/coherent  Interaction Assertive  Motor Activity Slow  Appearance/Hygiene In scrubs  Behavior Characteristics Cooperative  Mood Pleasant  Thought Process  Coherency Disorganized;Loose associations  Content Delusions  Delusions Controlling  Perception Derealization  Hallucination None reported or observed  Judgment Impaired  Confusion None  Danger to Self  Current suicidal ideation? Denies  Danger to Others  Danger to Others None reported or observed

## 2023-10-14 NOTE — Progress Notes (Signed)
 [facility] Medical Student Progress Note  10/14/2023 9:59 AM ULRIC SALZMAN  MRN:  [id number]  Principal Problem: Schizophrenia (HCC) Diagnosis: Principal Problem:   Schizophrenia (HCC)   Reason for Admission:  BRENTYN SEEHAFER is a 63 y.o., male with a past psychiatric history significant for schizoaffective disorder, bipolar type admitted involuntarily to the [facility] from [facility] for evaluation and management of agitation in his group home and psychosis. (Patient was admitted on 10/03/2023, total  LOS: 11 days )  Chart Review from last 24 hours:  The patient's chart was reviewed and nursing notes were reviewed. The patient's case was discussed in multidisciplinary team meeting. Per social work someone from the group home is going to come plan assess the patient is near baseline.  Social work reports that they have been unable to get in contact with group home but have a contact number.  - Overnight events to report per chart review / staff report: no notable overnight events to report - Patient received all scheduled medications except: paliperidone IM - Patient received the following PRN medications: Maalox/Mylanta, Hydroxyzine, and Trazodone  Information Obtained Today During Patient Interview: The patient was seen and evaluated on the unit. On assessment today the patient reported feeling hectic and agitated. He reported his mood as "getting through it." He presented A&O x 0 (stating his name as "when I'm 48," place as "[address]," time as "7:11," and situation as "interviewing for a job application"). He stated that he felt hectic and agitated because he felt misunderstood by everyone and could not eat. He reported that his stomach was still upset. He stated that he spent most of the day yesterday and today sleeping and walking around the unit. He endorsed attending some groups sessions when he felt well. He reported tossing and turning in his sleep, sleeping for a total of "52 card  pick up." Nursing reported he slept for 6.5 hours. Over the last 24 hours, he reported his anxiety as -10/10, but refused to elaborate further upon questioning. He reported his depression as 102/10, but refused to elaborate any further upon questioning. He denied any SI, HI, and AVH. He still endorsed that he felt like the nurses were out to get him. He reported his energy levels as -101/10 feeling like he had no energy and reported less pain compared to yesterday. He stated that the medication he received helped his pain. He did not respond to further questions about any beneficial effects of his other medications, but continued to report a dry mouth as a side effect. He denied any other side effects. He endorsed having a poor appetite, stating that he was not eating as well because of his upset stomach, lack of appetite, and dislike of the food. He reported trying to hydrate the best he could. He endorsed having a bowel movement this morning. He also denied refusing any medication injections yesterday.   Collateral Information   Group home administrator [phone]), 10/14/2023: no response and voicemail box is full.   Legal Guardian [name], [name] - legal guardian [phone] , dial "[phone]" for direct extension    10/14/23:  No response x2 today. Left a HIPAA compliant voice mail that would call tomorrow to provide updates, get consent for a medication, and discharge planning.   10/12/23 Amenable with transitioning from oral to long acting injectable of risperidone. She is aware of possible need to gradually taper off oral risperidone after receiving injection. Also amenable with starting rivastigmine for suspected neurocognitive  disorder and gabapentin for pain/irritability. Will continue to discuss dispo planning with group home prior to discharge.    10/09/23  Patient was previously discharged from hospitalization December 2024. She moved to his current group home in January, did fine for  a few weeks and then started to decline. He was mean, irritable, argumentative, would make racial comments and was intermittently non-compliant with medications. He would manipulative and take meds whenever he wanted something from the staff. At his baseline he doesn't answer questions directly and answers in riddles. She suspects that he attempts to be preformative and won't answer others questions appropriately. She desires the patient to be on a long acting injectable given his prior history of medical non-compliance. She just recently picked up his case this year. She requests that primary team contact her prior to engaging with group home. She hasn't been able to speak with the group home and was unaware of the patient's admission.      Hospital Discharge meds from December 2024 include:    0.5 mg benztropine daily    Risperidone 200 mg q8w    Topamax 100 mg at bedtime    Trazodone 150 mg at bedtime     Loratadine 10 mg at bedtime    Senna 8.6 mg daily  Past Psychiatric History: (All pertinent psychiatric, medical, family, and social history obtained from previous progress notes and H&P) Previous psychiatric diagnoses: schizoaffective d/o Prior psychiatric treatment: collateral is unsure Psychiatric medication compliance history: poor   Current psychiatric treatment: collateral is unsure Current psychiatrist: collateral is unsure Current therapist: collateral is unsure   Previous hospitalizations yes, previously in [facility] for 8 months; per chart review was in [facility] in 2019 History of suicide attempts: denies History of self harm: denies  Past Medical History:  Past Medical History:  Diagnosis Date   Schizophrenia [facility])     Family Psychiatric History:  Psych: brother committed suicide, other brother has ADHD and bipolar disorder   Social History:  Housing: lives in [place] group home Education: finished Soil scientist: history of sex offender-was in jail for 2  years Weapons: denies  Current Medications: Current Facility-Administered Medications  Medication Dose Route Frequency Provider Last Rate Last Admin   acetaminophen (TYLENOL) tablet 650 mg  650 mg Oral Q8H Peterson Ao, MD   650 mg at 10/13/23 2035   alum & mag hydroxide-simeth (MAALOX/MYLANTA) 200-200-20 MG/5ML suspension 30 mL  30 mL Oral Q4H PRN Ophelia Shoulder E, NP   30 mL at 10/14/23 0446   haloperidol (HALDOL) tablet 5 mg  5 mg Oral TID PRN Chales Abrahams, NP       And   diphenhydrAMINE (BENADRYL) capsule 50 mg  50 mg Oral TID PRN Chales Abrahams, NP       haloperidol lactate (HALDOL) injection 5 mg  5 mg Intramuscular TID PRN Chales Abrahams, NP       And   diphenhydrAMINE (BENADRYL) injection 50 mg  50 mg Intramuscular TID PRN Chales Abrahams, NP       And   LORazepam (ATIVAN) injection 2 mg  2 mg Intramuscular TID PRN Ophelia Shoulder E, NP       haloperidol lactate (HALDOL) injection 10 mg  10 mg Intramuscular TID PRN Ophelia Shoulder E, NP       And   diphenhydrAMINE (BENADRYL) injection 50 mg  50 mg Intramuscular TID PRN Chales Abrahams, NP       And   LORazepam (ATIVAN) injection 2  mg  2 mg Intramuscular TID PRN Chales Abrahams, NP       docusate sodium (COLACE) capsule 100 mg  100 mg Oral Daily Peterson Ao, MD   100 mg at 10/14/23 0759   gabapentin (NEURONTIN) capsule 100 mg  100 mg Oral TID Peterson Ao, MD   100 mg at 10/14/23 0759   hydrOXYzine (ATARAX) tablet 25 mg  25 mg Oral TID PRN Kizzie Ide B, MD   25 mg at 10/13/23 2035   ibuprofen (ADVIL) tablet 400 mg  400 mg Oral Q4H PRN Peterson Ao, MD       magnesium hydroxide (MILK OF MAGNESIA) suspension 30 mL  30 mL Oral Daily PRN Chales Abrahams, NP       paliperidone (INVEGA SUSTENNA) injection 234 mg  234 mg Intramuscular Once Peterson Ao, MD       risperiDONE (RISPERDAL) 1 MG/ML oral solution 2 mg  2 mg Oral Q12H Peterson Ao, MD   2 mg at 10/14/23 0945   Followed by   [name] ON 10/16/2023] risperiDONE  (RISPERDAL) 1 MG/ML oral solution 1 mg  1 mg Oral Q12H Peterson Ao, MD       Followed by   [name] ON 10/18/2023] risperiDONE (RISPERDAL) 1 MG/ML oral solution 0.5 mg  0.5 mg Oral Q12H Peterson Ao, MD       rivastigmine (EXELON) capsule 1.5 mg  1.5 mg Oral BID Peterson Ao, MD   1.5 mg at 10/14/23 0759   senna (SENOKOT) tablet 8.6 mg  1 tablet Oral Daily Peterson Ao, MD   8.6 mg at 10/14/23 0944   traZODone (DESYREL) tablet 100 mg  100 mg Oral QHS PRN Peterson Ao, MD   100 mg at 10/13/23 2035    Lab Results: No results found for this or any previous visit (from the past 48 hours).  Blood Alcohol level:  Lab Results  Component Value Date   ETH <10 09/29/2023   ETH <10 11/28/2021    Metabolic Labs: Lab Results  Component Value Date   HGBA1C 5.6 10/05/2023   MPG 114.02 10/05/2023   MPG 123 12/07/2020   Lab Results  Component Value Date   PROLACTIN 34.1 (H) 05/08/2018   Lab Results  Component Value Date   CHOL 162 10/05/2023   TRIG 54 10/05/2023   HDL 47 10/05/2023   CHOLHDL 3.4 10/05/2023   VLDL 11 10/05/2023   LDLCALC 104 (H) 10/05/2023   LDLCALC 127 (H) 12/07/2020    Physical Findings: AIMS: No  CIWA:    COWS:     Psychiatric Specialty Exam: General Appearance: Age-appropriate, disheveled male sitting in bed.   Eye Contact: Fair   Speech: Slight slowed rate and cooperative with interview   Volume: Normal   Mood: "Getting through it"   Affect: Constricted and mood congruent   Thought Content: Patient started with logical thought content, but became more disorganized after the first three to four questions of the interview.   Suicidal Thoughts: Denied SI.   Homicidal Thoughts: Denied HI.   Thought Process: Patient started with linear thought content, but became more tangential and disorganized after the first three to four questions of the interview..   Orientation: Oriented x 0     Memory: Immediate Fair; Recent Poor; Remote Poor    Judgment: Poor   Insight: Lacking   Concentration: Poor   Recall: Poor   Fund of Knowledge: Poor   Language: Fair   Psychomotor Activity: Normal   Assets: Housing  Sleep: Reported tossing and turning for "52 card pick up" long.  Slept 6 hours per nursing.    Review of Systems Filled out as completely as possible given patient's lack of response/inappropriate responses during review of systems. Review of Systems  Constitutional:  Negative for chills, diaphoresis and fever.  Respiratory:  Negative for cough, shortness of breath and wheezing.   Cardiovascular:  Positive for chest pain and palpitations.  Gastrointestinal:  Negative for constipation and diarrhea.  Musculoskeletal:  Negative for myalgias.  Neurological:  Positive for dizziness.  Psychiatric/Behavioral:  Negative for hallucinations and suicidal ideas.     Vital Signs: Blood pressure 128/88, pulse 72, temperature (!) 97.5 F (36.4 C), temperature source Oral, resp. rate 16, height 5\' 6"  (1.676 m), weight 66.2 kg, SpO2 99%. Body mass index is 23.57 kg/m.  Physical Exam Eyes:     Conjunctiva/sclera: Conjunctivae normal.  Pulmonary:     Effort: Pulmonary effort is normal.  Musculoskeletal:        General: Normal range of motion.     Cervical back: Normal range of motion.  Neurological:     Mental Status: He is alert. He is disoriented.     Assets  Assets: Housing   Treatment Plan Summary: Daily contact with patient to assess and evaluate symptoms and progress in treatment and Medication management   ASSESSMENT: Anthony Knox is a 63 y.o., male with a past psychiatric history significant for schizoaffective disorder, bipolar type admitted involuntarily to the [facility] from [facility] for evaluation and management of agitation in his group home and psychosis. (Patient was admitted on 10/03/2023, total  LOS: 11 days )  On assessment, patient presented pleasant, organized, logical, and  linear in his thought process. As the interview progressed to the third and fourth questions, patient's thought process and responses became disorganized, tangential, and nonsensical. Patient presented alert and oriented x 0. Similar to the previous day, when asked questions regarding his orientation, activities, and medication, patient provided answers that did not match the questions originally asked. Patient refused to elaborate any further on his answers when asked clarifying questions. He denied any SI, HI, and AVH. He reported decreased appetite over the last few days because of an upset stomach, not enjoying the food, and not having an appetite.   We are attempting to determine patient's baseline but having difficulty getting contact with group home who does the patient.  The guardian had consented to [place] on 3/31 but patient is refusing injection.  Unable to get in contact with legal guardian today to discuss this.  Group home did not respond in social work is continuing to reach out to them. We will continue offering LAI, attempting to contact [facility], and discussing transfer back to group home once psychiatry at baseline.    DIAGNOSIS: Schizophrenia  PLAN: Safety and Monitoring:  -- Involuntary admission to inpatient psychiatric unit for safety, stabilization and treatment  -- Daily contact with patient to assess and evaluate symptoms and progress in treatment  -- Patient's case to be discussed in multi-disciplinary team meeting  -- Observation Level : q15 minute checks  -- Vital signs:  q12 hours  -- Precautions: suicide, elopement, and assault  2. Interventions (medications, psychoeducation, etc):               -- medical regimen:    -- Administer Paliperidone 234 mg IM x1 on 4/3 for schizophrenia after obtaining consent from legal guardian   -- Continue tapering PO Risperdal 3 mg  every 12 hours for 3 more doses starting 03/31, then 2 mg every 12 hours for 4 doses, then 1 mg every 12  hours for 4 doses, and then 0.5 mg every 12 hours for 4 doses -- Continue Rivastigmine 1.5 mg twice daily for major neurocognitive disorder   -- Continue Gabapentin 100 mg three times daily for pain/irritability   -- Continue Docusate sodium 100 mg and senna 8.6 mg tablet daily for constipation   -- Continue acetaminophen 650 mg every 8 hours for pain   PRN medications for symptomatic management:              -- continue acetaminophen 650 mg every 6 hours as needed for mild to moderate pain, fever, and headaches              -- continue hydroxyzine 25 mg three times a day as needed for anxiety              -- continue bismuth subsalicylate 524 mg oral chewable tablet every 3 hours as needed for indigestion              -- continue senna 8.6 mg oral at bedtime as needed and polyethylene glycol 17 g oral daily as needed for mild to moderate constipation              -- continue ondansetron 8 mg every 8 hours as needed for nausea or vomiting              -- continue aluminum-magnesium hydroxide + simethicone 30 mL every 4 hours as needed for heartburn              -- continue trazodone 100 mg at bedtime as needed for insomnia  -- As needed agitation protocol in-place  The risks/benefits/side-effects/alternatives to the above medication were discussed in detail with the patient and time was given for questions. The patient consents to medication trial. FDA black box warnings, if present, were discussed.  The patient is agreeable with the medication plan, as above. We will monitor the patient's response to pharmacologic treatment, and adjust medications as necessary.  3. Routine and other pertinent labs:             -- Metabolic profile:  BMI: Body mass index is 23.57 kg/m.  Prolactin: Lab Results  Component Value Date   PROLACTIN 34.1 (H) 05/08/2018    Lipid Panel: Lab Results  Component Value Date   CHOL 162 10/05/2023   TRIG 54 10/05/2023   HDL 47 10/05/2023   CHOLHDL 3.4  10/05/2023   VLDL 11 10/05/2023   LDLCALC 104 (H) 10/05/2023   LDLCALC 127 (H) 12/07/2020    HbgA1c: Hgb A1c MFr Bld (%)  Date Value  10/05/2023 5.6    TSH: TSH (uIU/mL)  Date Value  10/03/2023 4.287  07/15/2010 6.293 (H)    EKG monitoring: QTc: 399 w/PVC  4. Group Therapy:  -- Encouraged patient to participate in unit milieu and in scheduled group therapies   -- Short Term Goals: Ability to identify changes in lifestyle to reduce recurrence of condition, verbalize feelings, identify and develop effective coping behaviors, maintain clinical measurements within normal limits, and identify triggers associated with substance abuse/mental health issues will improve. Improvement in ability to disclose and discuss suicidal ideas, demonstrate self-control, and comply with prescribed medications.  -- Long Term Goals: Improvement in symptoms so as ready for discharge -- Patient is encouraged to participate in group therapy while admitted to the psychiatric unit. --  We will address other chronic and acute stressors, which contributed to the patient's Schizophrenia (HCC) in order to reduce the risk of self-harm at discharge.  5. Discharge Planning:   -- Social work and case management to assist with discharge planning and identification of hospital follow-up needs prior to discharge  -- Estimated LOS: 7-10 days  -- Discharge Concerns: Need to establish a safety plan; Medication compliance and effectiveness  -- Discharge Goals: Return home with outpatient referrals for mental health follow-up including medication management/psychotherapy  I certify that inpatient services furnished can reasonably be expected to improve the patient's condition.   Signed: Janith Lima, Medical Student 10/14/2023, 9:59 AM   I personally was present and performed or re-performed the history, physical exam and medical decision-making activities of this service and have verified that the service and findings  are accurately documented in the student's note. I made edits to note as appropriate and information is accurate.   Meryl Dare, MD PGY-1 Psychiatry Resident 10/14/2023, 3:35 PM

## 2023-10-14 NOTE — Progress Notes (Signed)
 Patient refused am Tylenol. Denies Pain at present.

## 2023-10-14 NOTE — Plan of Care (Signed)
  Problem: Education: Goal: Emotional status will improve Outcome: Progressing Goal: Verbalization of understanding the information provided will improve Outcome: Progressing   Problem: Activity: Goal: Sleeping patterns will improve Outcome: Progressing   Problem: Health Behavior/Discharge Planning: Goal: Identification of resources available to assist in meeting health care needs will improve Outcome: Progressing   Problem: Physical Regulation: Goal: Ability to maintain clinical measurements within normal limits will improve Outcome: Progressing

## 2023-10-14 NOTE — Progress Notes (Signed)
   10/14/23 1100  Psych Admission Type (Psych Patients Only)  Admission Status Involuntary  Psychosocial Assessment  Patient Complaints Suspiciousness  Eye Contact Brief  Facial Expression Flat  Affect Flat  Speech Logical/coherent  Interaction Assertive  Motor Activity Slow  Appearance/Hygiene In hospital gown  Behavior Characteristics Cooperative  Mood Pleasant  Thought Process  Coherency Disorganized;Loose associations  Content Delusions  Delusions Controlled  Perception Derealization  Hallucination None reported or observed  Judgment Impaired  Confusion None  Danger to Self  Current suicidal ideation? Denies  Danger to Others  Danger to Others None reported or observed

## 2023-10-14 NOTE — BH IP Treatment Plan (Signed)
 Interdisciplinary Treatment and Diagnostic Plan Update  10/14/2023 Time of Session: 1003 - update Anthony Knox MRN: [id number]  Principal Diagnosis: Schizophrenia [facility])  Secondary Diagnoses: Principal Problem:   Schizophrenia (HCC)   Current Medications:  Current Facility-Administered Medications  Medication Dose Route Frequency Provider Last Rate Last Admin   acetaminophen (TYLENOL) tablet 650 mg  650 mg Oral Q8H Peterson Ao, MD   650 mg at 10/13/23 2035   alum & mag hydroxide-simeth (MAALOX/MYLANTA) 200-200-20 MG/5ML suspension 30 mL  30 mL Oral Q4H PRN Ophelia Shoulder E, NP   30 mL at 10/14/23 0446   haloperidol (HALDOL) tablet 5 mg  5 mg Oral TID PRN Chales Abrahams, NP       And   diphenhydrAMINE (BENADRYL) capsule 50 mg  50 mg Oral TID PRN Chales Abrahams, NP       haloperidol lactate (HALDOL) injection 5 mg  5 mg Intramuscular TID PRN Chales Abrahams, NP       And   diphenhydrAMINE (BENADRYL) injection 50 mg  50 mg Intramuscular TID PRN Chales Abrahams, NP       And   LORazepam (ATIVAN) injection 2 mg  2 mg Intramuscular TID PRN Ophelia Shoulder E, NP       haloperidol lactate (HALDOL) injection 10 mg  10 mg Intramuscular TID PRN Chales Abrahams, NP       And   diphenhydrAMINE (BENADRYL) injection 50 mg  50 mg Intramuscular TID PRN Chales Abrahams, NP       And   LORazepam (ATIVAN) injection 2 mg  2 mg Intramuscular TID PRN Chales Abrahams, NP       docusate sodium (COLACE) capsule 100 mg  100 mg Oral Daily Peterson Ao, MD   100 mg at 10/14/23 0759   gabapentin (NEURONTIN) capsule 100 mg  100 mg Oral TID Peterson Ao, MD   100 mg at 10/14/23 0759   hydrOXYzine (ATARAX) tablet 25 mg  25 mg Oral TID PRN Kizzie Ide B, MD   25 mg at 10/13/23 2035   ibuprofen (ADVIL) tablet 400 mg  400 mg Oral Q4H PRN Peterson Ao, MD       magnesium hydroxide (MILK OF MAGNESIA) suspension 30 mL  30 mL Oral Daily PRN Chales Abrahams, NP       paliperidone (INVEGA SUSTENNA) injection  234 mg  234 mg Intramuscular Once Peterson Ao, MD       risperiDONE (RISPERDAL) 1 MG/ML oral solution 2 mg  2 mg Oral Q12H Peterson Ao, MD   2 mg at 10/14/23 0945   Followed by   [name] ON 10/16/2023] risperiDONE (RISPERDAL) 1 MG/ML oral solution 1 mg  1 mg Oral Q12H Peterson Ao, MD       Followed by   [name] ON 10/18/2023] risperiDONE (RISPERDAL) 1 MG/ML oral solution 0.5 mg  0.5 mg Oral Q12H Peterson Ao, MD       rivastigmine (EXELON) capsule 1.5 mg  1.5 mg Oral BID Peterson Ao, MD   1.5 mg at 10/14/23 0759   senna (SENOKOT) tablet 8.6 mg  1 tablet Oral Daily Peterson Ao, MD   8.6 mg at 10/14/23 0944   traZODone (DESYREL) tablet 100 mg  100 mg Oral QHS PRN Peterson Ao, MD   100 mg at 10/13/23 2035   PTA Medications: Medications Prior to Admission  Medication Sig Dispense Refill Last Dose/Taking   benztropine (COGENTIN) 0.5 MG tablet Take 1 tablet (0.5 mg total) by  mouth 2 (two) times daily in the am and at bedtime.. For prevention of drug induced tremors (Patient not taking: Reported on 11/28/2021) 60 tablet 0    hydrOXYzine (ATARAX/VISTARIL) 25 MG tablet Take 1 tablet (25 mg total) by mouth every 6 (six) hours as needed for anxiety (Sleep). (Patient not taking: Reported on 11/28/2021) 75 tablet 0    levothyroxine (SYNTHROID) 50 MCG tablet Take 1 tablet (50 mcg total) by mouth daily at 6 (six) AM. For thyroid hormone replacement (Patient not taking: Reported on 11/28/2021) 15 tablet 0    paliperidone (INVEGA SUSTENNA) 156 MG/ML SUSY injection Inject 1 mL (156 mg total) into the muscle every 28 (twenty-eight) days. Next dose due 02/28/2021. (Patient not taking: Reported on 11/28/2021) 1.2 mL 0    polyethylene glycol (MIRALAX / GLYCOLAX) 17 g packet Take 17 g by mouth daily. (Patient not taking: Reported on 11/28/2021) 14 each 0    prazosin (MINIPRESS) 2 MG capsule Take 2 mg by mouth at bedtime. (Patient not taking: Reported on 09/30/2023)      ramelteon (ROZEREM) 8 MG tablet Take  1 tablet (8 mg total) by mouth at bedtime. For sleep (Patient not taking: Reported on 11/28/2021) 15 tablet 0    risperiDONE (RISPERDAL) 2 MG tablet Take 1 tablet (2 mg total) by mouth at bedtime. For mood control (Patient not taking: Reported on 11/28/2021) 30 tablet 0    senna (SENOKOT) 8.6 MG TABS tablet Take 1 tablet (8.6 mg total) by mouth daily. (May buy from over the counter): For constipation (Patient not taking: Reported on 11/28/2021) 1 tablet 0    valproic acid (DEPAKENE) 250 MG/5ML solution Take by mouth See admin instructions. Give 15 ml by mouth twice daily (Patient not taking: Reported on 09/30/2023)       Patient Stressors:    Patient Strengths:    Treatment Modalities: Medication Management, Group therapy, Case management,  1 to 1 session with clinician, Psychoeducation, Recreational therapy.   Physician Treatment Plan for Primary Diagnosis: Schizophrenia (HCC) Long Term Goal(s):     Short Term Goals: Ability to identify changes in lifestyle to reduce recurrence of condition will improve Ability to verbalize feelings will improve Ability to disclose and discuss suicidal ideas Ability to demonstrate self-control will improve Ability to identify and develop effective coping behaviors will improve Ability to maintain clinical measurements within normal limits will improve Compliance with prescribed medications will improve Ability to identify triggers associated with substance abuse/mental health issues will improve  Medication Management: Evaluate patient's response, side effects, and tolerance of medication regimen.  Therapeutic Interventions: 1 to 1 sessions, Unit Group sessions and Medication administration.  Evaluation of Outcomes: Progressing  Physician Treatment Plan for Secondary Diagnosis: Principal Problem:   Schizophrenia (HCC)  Long Term Goal(s):     Short Term Goals: Ability to identify changes in lifestyle to reduce recurrence of condition will  improve Ability to verbalize feelings will improve Ability to disclose and discuss suicidal ideas Ability to demonstrate self-control will improve Ability to identify and develop effective coping behaviors will improve Ability to maintain clinical measurements within normal limits will improve Compliance with prescribed medications will improve Ability to identify triggers associated with substance abuse/mental health issues will improve     Medication Management: Evaluate patient's response, side effects, and tolerance of medication regimen.  Therapeutic Interventions: 1 to 1 sessions, Unit Group sessions and Medication administration.  Evaluation of Outcomes: Progressing   RN Treatment Plan for Primary Diagnosis: Schizophrenia (HCC) Long Term Goal(s): Knowledge  of disease and therapeutic regimen to maintain health will improve  Short Term Goals: Ability to remain free from injury will improve, Ability to demonstrate self-control, Ability to participate in decision making will improve, and Ability to verbalize feelings will improve  Medication Management: RN will administer medications as ordered by provider, will assess and evaluate patient's response and provide education to patient for prescribed medication. RN will report any adverse and/or side effects to prescribing provider.  Therapeutic Interventions: 1 on 1 counseling sessions, Psychoeducation, Medication administration, Evaluate responses to treatment, Monitor vital signs and CBGs as ordered, Perform/monitor CIWA, COWS, AIMS and Fall Risk screenings as ordered, Perform wound care treatments as ordered.  Evaluation of Outcomes: Progressing   LCSW Treatment Plan for Primary Diagnosis: Schizophrenia (HCC) Long Term Goal(s): Safe transition to appropriate next level of care at discharge, Engage patient in therapeutic group addressing interpersonal concerns.  Short Term Goals: Engage patient in aftercare planning with referrals  and resources, Increase ability to appropriately verbalize feelings, Facilitate acceptance of mental health diagnosis and concerns, and Identify triggers associated with mental health/substance abuse issues  Therapeutic Interventions: Assess for all discharge needs, 1 to 1 time with Social worker, Explore available resources and support systems, Assess for adequacy in community support network, Educate family and significant other(s) on suicide prevention, Complete Psychosocial Assessment, Interpersonal group therapy.  Evaluation of Outcomes: Progressing   Progress in Treatment: Attending groups: Yes. Participating in groups: Yes. Taking medication as prescribed: Yes. Toleration medication: Yes. Family/Significant other contact made: No, will contact:  declined consents Patient understands diagnosis: No. Discussing patient identified problems/goals with staff: No. Medical problems stabilized or resolved: Yes. Denies suicidal/homicidal ideation: Yes. Issues/concerns per patient self-inventory: No.   New problem(s) identified: No, Describe:  none reported   New Short Term/Long Term Goal(s): medication stabilization, elimination of SI thoughts, development of comprehensive mental wellness plan.      Patient Goals:  Pt unable to participate in treatment team, experiencing psychosis   Discharge Plan or Barriers: Patient recently admitted. CSW will continue to follow and assess for appropriate referrals and possible discharge planning.      Reason for Continuation of Hospitalization: Delusions  Hallucinations Medication stabilization   Estimated Length of Stay: 2-4 days  Last 3 [place] Suicide Severity Risk Score: Flowsheet Row Ad[facility] (Current) [facility]29/2023 in [facility] ED from 01/31/2021 in [facility]  C-SSRS RISK CATEGORY Low Risk No Risk  Error: Q6 is Yes, you must answer 7       Last PHQ 2/9 Scores:    01/31/2021   11:44 AM  Depression screen PHQ 2/9  Decreased Interest 3  Down, Depressed, Hopeless 3  PHQ - 2 Score 6  Altered sleeping 3  Tired, decreased energy 3  Change in appetite 3  Feeling bad or failure about yourself  3  Trouble concentrating 3  Moving slowly or fidgety/restless 3  Suicidal thoughts 3  PHQ-9 Score 27  Difficult doing work/chores Somewhat difficult    Scribe for Treatment Team: Kathi Der, LCSWA 10/14/2023 10:03 AM

## 2023-10-14 NOTE — Progress Notes (Addendum)
 Upon evaluation today patient is sitting in the dayroom playing chess with another peer, he responds when I called his last name and walks out of the room for evaluation.  When I ask him what is his first name he responds "tim" he responds in a disorganized manner when asking him about his age.  He reports "depends on day and time" "1444" he tells me he had poor sleep last night had interrupted sleep, he denies side effect to medications, when I asked him if he agrees to take LAI injection [place] he agrees.  Will attempt to contact patient's guardian to get his consent for starting Invega LAI.  Per staff, group home staff are planning to visit tonight to assess if patient is back to baseline, will follow and discussed discharge plan afterward.  Will give Invega Sustenna 234 mg injection today to be followed by 156 mg on day 5, will follow. Upon evaluation patient does not appear sleepy and does not display any sign consistent with EPS or TD.

## 2023-10-14 NOTE — Group Note (Signed)
 Date:  10/14/2023 Time:  8:41 PM  Group Topic/Focus:  Wrap-Up Group:   The focus of this group is to help patients review their daily goal of treatment and discuss progress on daily workbooks.    Participation Level:  Minimal  Participation Quality:  Intrusive  Affect:  Flat  Cognitive:  Disorganized and Confused  Insight: Limited  Engagement in Group:  Limited and Off Topic  Modes of Intervention:  Discussion and Socialization  Additional Comments:  Patient shared that his day was a "there day" but did not elaborate. Patient shared that he "observed today". Patient unable to provide rating for day.   Anthony Knox 10/14/2023, 8:41 PM

## 2023-10-14 NOTE — Group Note (Signed)
 Recreation Therapy Group Note   Group Topic:Coping Skills  Group Date: 10/14/2023 Start Time: 1020 End Time: 1055 Facilitators: Tinea Nobile-McCall, LRT,CTRS Location: 500 Hall Dayroom   Group Topic: Coping Skills  Goal Area(s) Addresses:  Patient will identify positive coping skills. Patient will identify benefits of using coping skills post d/c.  Intervention: Worksheet, Group brain storming  Activity :  Mind Map.  Patient was provided a blank template of a diagram with 32 blank boxes in a tiered system, branching from the center (similar to a bubble chart). LRT directed patients to label the middle of the diagram "Coping Skills". As a group, LRT and pts filled in the 8 boxes (anger, depression, anxiety, fear, homeless, abuse, stress and sadness) branching out from the center box. Pt were directed to identify and record three coping skills in the 3 boxes extending from the first 8 boxes for each situation identified. Pts shared the coping skills they came up with and LRT wrote them on the board.   Education:  Stress Management, Discharge Planning.   Education Outcome: Acknowledges Education   Affect/Mood: N/A   Participation Level: Did not attend    Clinical Observations/Individualized Feedback:      Plan: Continue to engage patient in RT group sessions 2-3x/week.   Anthony Knox, LRT,CTRS 10/14/2023 1:54 PM

## 2023-10-14 NOTE — Plan of Care (Signed)
   Problem: Education: Goal: Knowledge of [place] [facility] Education information/materials will improve Outcome: Progressing Goal: Emotional status will improve Outcome: Progressing Goal: Mental status will improve Outcome: Progressing

## 2023-10-14 NOTE — Progress Notes (Addendum)
 Collateral contact - [name] - legal guardian - [phone] x [phone]   Legal guardian provided contact information for the group home:  "[facility], Assisted Living Home Administrator, Desk: [phone], Fax: [phone], [email or web address]."    Collateral contact - [facility] (group home), Barbie Banner  12:50 PM - CSW attempted to call [phone] but there was no response.  The message said, "The mailbox is full and can't accept messages at this time."    1:41 PM - CSW emailed [email or web address] via secure email  4:08 PM - CSW attempted to call again but there was no response.  The message said, "The mailbox is full and can't accept messages at this time."     Collateral contact - [name] - legal guardian - [phone]   CSW left a voicemail for legal guardian informing her that she hasn't been able to reach the group home yet.   Devanny Palecek, LCSWA 10/14/2023

## 2023-10-15 DIAGNOSIS — F250 Schizoaffective disorder, bipolar type: Secondary | ICD-10-CM | POA: Diagnosis not present

## 2023-10-15 MED ORDER — ACETAMINOPHEN 325 MG PO TABS: 650.0000 mg | ORAL_TABLET | Freq: Four times a day (QID) | ORAL | Status: DC | PRN

## 2023-10-15 NOTE — Progress Notes (Signed)
 [facility] Medical Student Progress Note  10/15/2023 1:10 PM Anthony Knox  MRN:  [id number]  Principal Problem: Schizophrenia [facility]) Diagnosis: Principal Problem:   Schizophrenia (HCC)   Reason for Admission:  Anthony Knox is a 63 y.o., male with a past psychiatric history significant for schizoaffective disorder, bipolar type admitted involuntarily to the [facility] from [facility] for evaluation and management of agitation in his group home and psychosis. (Patient was admitted on 10/03/2023, total  LOS: 12 days )  Chart Review from last 24 hours and interdisciplinary meeting:  The patient's chart was reviewed and nursing notes were reviewed. The patient's case was discussed in multidisciplinary team meeting.  Per social work group home is going to come assess patient 4/4 at lunch time to determine if patient is at baseline and can return to group home.  Per nursing patient is refusing long-acting injectable.  - Overnight events to report per chart review / staff report: no notable overnight events to report - Patient received all scheduled medications except: paliperidone IM and acetaminophen - Patient received the following PRN medications: Maalox/Mylanta  Information Obtained Today During Patient Interview: The patient was seen and evaluated on the unit. 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 He reported not attending any of his group sessions because he "didn't have a smile on his face." When asked about his sleep he said "I was so blue I don't know." He refused to elaborate any further on what that meant. Nursing reported he slept for 6 hours. He reported his anxiety as 21/22 and depression as 43/39 before the scale could be  explained to him. When asked further about his anxiety and depression, he refused to respond or explain. He reported that he does not have much energy, feeling tired constantly. He described his pain as "Pap's blue ribbon on the wrong height." When asked about SI, he reported "only splish splash taking a bath." He denied HI and AVH. He reported that his medication made him feel "treated, but untreated." He endorsed side effects such as fatigue, dry mouth, dry throat and constipation, but denied any other side effects. He reported his appetite as dependent on what was being served in [employer]. He reported trying to hydrate and stated his last bowel movement was "last time I stepped on a pigeon stool." He reported being both open to and against getting the medication injection stating that if felt like "forced trigonometry," but also open to seeing how it might help him with his discharge.   Collateral Information   Group home administrator [phone]), 10/14/2023: no response and voicemail box is full.   Legal Guardian Anthony Knox, Anthony Knox - legal guardian [phone] , dial "[phone]" for direct extension    10/14/23:  No response x2 today. Left a HIPAA compliant voice mail that would call tomorrow to provide updates, get consent for a medication, and discharge planning.   10/12/23 Amenable with transitioning from oral to long acting injectable of risperidone. She is aware of possible need to gradually taper off oral risperidone after receiving injection. Also amenable with starting rivastigmine for suspected neurocognitive disorder and gabapentin for pain/irritability. Will continue to discuss dispo planning with group home prior to discharge.  10/09/23  Patient was previously discharged from hospitalization December 2024. She moved to his current group home in January, did fine for a few weeks and then started to decline. He was mean, irritable, argumentative, would make racial comments and  was intermittently non-compliant with medications. He would manipulative and take meds whenever he wanted something from the staff. At his baseline he doesn't answer questions directly and answers in riddles. She suspects that he attempts to be preformative and won't answer others questions appropriately. She desires the patient to be on a long acting injectable given his prior history of medical non-compliance. She just recently picked up his case this year. She requests that primary team contact her prior to engaging with group home. She hasn't been able to speak with the group home and was unaware of the patient's admission.      Hospital Discharge meds from December 2024 include:    0.5 mg benztropine daily    Risperidone 200 mg q8w    Topamax 100 mg at bedtime    Trazodone 150 mg at bedtime     Loratadine 10 mg at bedtime    Senna 8.6 mg daily  Past Psychiatric History: (All pertinent psychiatric, medical, family, and social history obtained from previous progress notes and H&P) Previous psychiatric diagnoses: schizoaffective d/o Prior psychiatric treatment: collateral is unsure Psychiatric medication compliance history: poor   Current psychiatric treatment: collateral is unsure Current psychiatrist: collateral is unsure Current therapist: collateral is unsure   Previous hospitalizations yes, previously in [facility] for 8 months; per chart review was in [facility] in 2019 History of suicide attempts: denies History of self harm: denies  Past Medical History:  Past Medical History:  Diagnosis Date   Schizophrenia (HCC)     Family Psychiatric History:  Psych: brother committed suicide, other brother has ADHD and bipolar disorder   Social History:  Housing: lives in [facility] group home Education: finished 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 Weapons: denies  Current Medications: Current Facility-Administered Medications  Medication Dose Route Frequency  Provider Last Rate Last Admin   acetaminophen (TYLENOL) tablet 650 mg  650 mg Oral Q6H PRN Meryl Dare, MD       alum & mag hydroxide-simeth (MAALOX/MYLANTA) 200-200-20 MG/5ML suspension 30 mL  30 mL Oral Q4H PRN Ophelia Shoulder E, NP   30 mL at 10/15/23 0400   haloperidol (HALDOL) tablet 5 mg  5 mg Oral TID PRN Chales Abrahams, NP       And   diphenhydrAMINE (BENADRYL) capsule 50 mg  50 mg Oral TID PRN Ophelia Shoulder E, NP       haloperidol lactate (HALDOL) injection 5 mg  5 mg Intramuscular TID PRN Chales Abrahams, NP       And   diphenhydrAMINE (BENADRYL) injection 50 mg  50 mg Intramuscular TID PRN Chales Abrahams, NP       And   LORazepam (ATIVAN) injection 2 mg  2 mg Intramuscular TID PRN Ophelia Shoulder E, NP       haloperidol lactate (HALDOL) injection 10 mg  10 mg Intramuscular TID PRN Ophelia Shoulder E, NP       And   diphenhydrAMINE (BENADRYL) injection 50 mg  50 mg Intramuscular TID PRN Ophelia Shoulder E, NP       And   LORazepam (ATIVAN) injection 2 mg  2 mg Intramuscular TID PRN Ophelia Shoulder E, NP       docusate sodium (COLACE) capsule 100  mg  100 mg Oral Daily Peterson Ao, MD   100 mg at 10/15/23 0809   gabapentin (NEURONTIN) capsule 100 mg  100 mg Oral TID Peterson Ao, MD   100 mg at 10/15/23 1300   hydrOXYzine (ATARAX) tablet 25 mg  25 mg Oral TID PRN Kizzie Ide B, MD   25 mg at 10/13/23 2035   ibuprofen (ADVIL) tablet 400 mg  400 mg Oral Q4H PRN Peterson Ao, MD       magnesium hydroxide (MILK OF MAGNESIA) suspension 30 mL  30 mL Oral Daily PRN Chales Abrahams, NP       [name] ON 10/18/2023] paliperidone (INVEGA SUSTENNA) injection 156 mg  156 mg Intramuscular Once Abbott Pao, Nadir, MD       paliperidone (INVEGA SUSTENNA) injection 234 mg  234 mg Intramuscular Once Abbott Pao, Nadir, MD       risperiDONE (RISPERDAL) 1 MG/ML oral solution 2 mg  2 mg Oral Q12H Peterson Ao, MD   2 mg at 10/15/23 1610   Followed by   [name] ON 10/16/2023] risperiDONE (RISPERDAL) 1 MG/ML oral  solution 1 mg  1 mg Oral Q12H Peterson Ao, MD       Followed by   [name] ON 10/18/2023] risperiDONE (RISPERDAL) 1 MG/ML oral solution 0.5 mg  0.5 mg Oral Q12H Peterson Ao, MD       rivastigmine (EXELON) capsule 1.5 mg  1.5 mg Oral BID Peterson Ao, MD   1.5 mg at 10/15/23 0809   senna (SENOKOT) tablet 8.6 mg  1 tablet Oral Daily Peterson Ao, MD   8.6 mg at 10/15/23 0809   traZODone (DESYREL) tablet 100 mg  100 mg Oral QHS PRN Peterson Ao, MD   100 mg at 10/13/23 2035    Lab Results: No results found for this or any previous visit (from the past 48 hours).  Blood Alcohol level:  Lab Results  Component Value Date   ETH <10 09/29/2023   ETH <10 11/28/2021    Metabolic Labs: Lab Results  Component Value Date   HGBA1C 5.6 10/05/2023   MPG 114.02 10/05/2023   MPG 123 12/07/2020   Lab Results  Component Value Date   PROLACTIN 34.1 (H) 05/08/2018   Lab Results  Component Value Date   CHOL 162 10/05/2023   TRIG 54 10/05/2023   HDL 47 10/05/2023   CHOLHDL 3.4 10/05/2023   VLDL 11 10/05/2023   LDLCALC 104 (H) 10/05/2023   LDLCALC 127 (H) 12/07/2020    Physical Findings: AIMS: No  CIWA:    COWS:     Psychiatric Specialty Exam: General Appearance: Age-appropriate, disheveled male sitting in bed.   Eye Contact: Fair   Speech: Slightly slowed rate and cooperative with interview   Volume: Normal   Mood: "Strange Brew"   Affect: Constricted and mood congruent   Thought Content: Patient presented with disorganized thought content from beginning of interview.   Suicidal Thoughts: Unable to assess as patient responded with "only splish splash taking a bath."   Homicidal Thoughts: Denied HI.   Thought Process: Patient presented with tangential and disorganized though process from the beginning of the interview.   Orientation: Oriented x 0     Memory: Immediate Poor; Recent Poor; Remote Poor   Judgment: Poor   Insight: Poor   Concentration: Poor    Recall: Poor   Fund of Knowledge: Fair   Language: Good   Psychomotor Activity: Normal   Assets: Desire for Improvement; Housing   Sleep: Reported "I  was so blue I don't know."    Review of Systems Filled out as completely as possible given patient's lack of response/inappropriate responses during review of systems. Review of Systems  Constitutional:  Negative for chills, diaphoresis and fever.  HENT:  Positive for sore throat and tinnitus.   Eyes:  Positive for blurred vision.  Respiratory:  Negative for cough, shortness of breath and wheezing.   Cardiovascular:  Negative for chest pain and palpitations.  Gastrointestinal:  Positive for constipation. Negative for diarrhea, nausea and vomiting.  Musculoskeletal:  Negative for myalgias.  Neurological:  Negative for dizziness and headaches.  Psychiatric/Behavioral:  Negative for hallucinations.     Vital Signs: Blood pressure 126/77, pulse 71, temperature 97.9 F (36.6 C), temperature source Oral, resp. rate 16, height 5\' 6"  (1.676 m), weight 66.2 kg, SpO2 99%. Body mass index is 23.57 kg/m.  Physical Exam Eyes:     Conjunctiva/sclera: Conjunctivae normal.  Pulmonary:     Effort: Pulmonary effort is normal.  Musculoskeletal:        General: Normal range of motion.     Cervical back: Normal range of motion.  Neurological:     Mental Status: He is alert. He is disoriented.     Assets  Assets: Desire for Improvement; Housing   Treatment Plan Summary: Daily contact with patient to assess and evaluate symptoms and progress in treatment and Medication management   ASSESSMENT: KAVAN DEVAN is a 63 y.o., male with a past psychiatric history significant for schizoaffective disorder, bipolar type admitted involuntarily to the [facility] from [facility] for evaluation and management of agitation in his group home and psychosis. (Patient was admitted on 10/03/2023, total  LOS: 12 days )  On assessment, patient  presented pleasant, but with disorganized and tangential though process from the beginning of the interview. Patient presented alert and oriented x 0. Similar to previous days, when patient was questioned regarding his orientation, daily activities, medications, and symptoms, he provided inappropriate answers that did not match the content of the original questions. He was unable to elaborate further on any of his responses when questioned. We were unable to assess SI due to patient's tangential response, but he denied HI and AVH. During discussion, patient presented both open to and against taking medication injection.   We are still trying to determine patient's baseline through contact with the group home and legal guardian. Social work conveyed that a representative from the patient's group home will come by tomorrow during lunch to assess patient's condition.  Patient continuing to alternate between accepting and refusing long-acting directable.  Consent for LAI obtained on 3/31 and we will not pursue forced med for LAI at this time.  We will continue to offer the patient medication injection, attempt to contact legal guardian, discuss patient's status with group home representative tomorrow, and contact social work for dispo planning if patient is deemed to be at baseline.   DIAGNOSIS: Schizophrenia  PLAN: Safety and Monitoring:  -- Involuntary admission to inpatient psychiatric unit for safety, stabilization and treatment  -- Daily contact with patient to assess and evaluate symptoms and progress in treatment  -- Patient's case to be discussed in multi-disciplinary team meeting  -- Observation Level : q15 minute checks  -- Vital signs:  q12 hours  -- Precautions: suicide, elopement, and assault  2. Interventions (medications, psychoeducation, etc):    -- Administer Paliperidone 234 mg IM x1 on 4/4 for schizophrenia   -- Continue tapering PO Risperdal 3 mg  every 12 hours for 3 more doses  starting 03/31, then 2 mg every 12 hours for 4 doses, then 1 mg every 12 hours for 4 doses, and then 0.5 mg every 12 hours for 4 doses -- Continue Rivastigmine 1.5 mg twice daily for major neurocognitive disorder   -- Continue Gabapentin 100 mg three times daily for pain/irritability   -- Continue Docusate sodium 100 mg and senna 8.6 mg tablet daily for constipation   -- Continue acetaminophen 650 mg every 8 hours for pain   PRN medications for symptomatic management:              -- continue acetaminophen 650 mg every 6 hours as needed for mild to moderate pain, fever, and headaches              -- continue hydroxyzine 25 mg three times a day as needed for anxiety              -- continue bismuth subsalicylate 524 mg oral chewable tablet every 3 hours as needed for indigestion              -- continue senna 8.6 mg oral at bedtime as needed and polyethylene glycol 17 g oral daily as needed for mild to moderate constipation              -- continue ondansetron 8 mg every 8 hours as needed for nausea or vomiting              -- continue aluminum-magnesium hydroxide + simethicone 30 mL every 4 hours as needed for heartburn              -- continue trazodone 100 mg at bedtime as needed for insomnia  -- As needed agitation protocol in-place  The risks/benefits/side-effects/alternatives to the above medication were discussed in detail with the patient and time was given for questions. The patient consents to medication trial. FDA black box warnings, if present, were discussed.  The patient is agreeable with the medication plan, as above. We will monitor the patient's response to pharmacologic treatment, and adjust medications as necessary.  3. Routine and other pertinent labs:             -- Metabolic profile:  BMI: Body mass index is 23.57 kg/m.  Prolactin: Lab Results  Component Value Date   PROLACTIN 34.1 (H) 05/08/2018    Lipid Panel: Lab Results  Component Value Date   CHOL 162  10/05/2023   TRIG 54 10/05/2023   HDL 47 10/05/2023   CHOLHDL 3.4 10/05/2023   VLDL 11 10/05/2023   LDLCALC 104 (H) 10/05/2023   LDLCALC 127 (H) 12/07/2020    HbgA1c: Hgb A1c MFr Bld (%)  Date Value  10/05/2023 5.6    TSH: TSH (uIU/mL)  Date Value  10/03/2023 4.287  07/15/2010 6.293 (H)    EKG monitoring: QTc: 399 w/PVC  4. Group Therapy:  -- Encouraged patient to participate in unit milieu and in scheduled group therapies   -- Short Term Goals: Ability to identify changes in lifestyle to reduce recurrence of condition, verbalize feelings, identify and develop effective coping behaviors, maintain clinical measurements within normal limits, and identify triggers associated with substance abuse/mental health issues will improve. Improvement in ability to disclose and discuss suicidal ideas, demonstrate self-control, and comply with prescribed medications.  -- Long Term Goals: Improvement in symptoms so as ready for discharge -- Patient is encouraged to participate in group therapy while admitted to the psychiatric unit. --  We will address other chronic and acute stressors, which contributed to the patient's Schizophrenia (HCC) in order to reduce the risk of self-harm at discharge.  5. Discharge Planning:   -- Social work and case management to assist with discharge planning and identification of hospital follow-up needs prior to discharge  -- Estimated LOS: pending determination of baseline on 4/4 by group home. 4/4-4/14  -- Discharge Concerns: Need to establish a safety plan; Medication compliance and effectiveness  -- Discharge Goals: Return home with outpatient referrals for mental health follow-up including medication management/psychotherapy  I certify that inpatient services furnished can reasonably be expected to improve the patient's condition.   Signed: Janith Lima, Medical Student 10/15/2023, 1:10 PM  I personally was present and performed or re-performed the  history, physical exam and medical decision-making activities of this service and have verified that the service and findings are accurately documented in the student's note.  Meryl Dare, MD PGY-1 Psychiatry Resident 10/15/2023, 1:10 PM

## 2023-10-15 NOTE — Progress Notes (Signed)
   10/15/23 2145  Psych Admission Type (Psych Patients Only)  Admission Status Involuntary  Psychosocial Assessment  Patient Complaints Anxiety;Suspiciousness  Eye Contact Fair  Facial Expression Anxious  Affect Anxious;Preoccupied  Speech Logical/coherent  Interaction Defensive;Guarded  Motor Activity Slow  Appearance/Hygiene Disheveled  Behavior Characteristics Cooperative  Mood Suspicious;Labile  Aggressive Behavior  Effect No apparent injury  Thought Process  Coherency Circumstantial;Concrete thinking;Disorganized  Content Blaming others  Delusions Persecutory  Perception Derealization  Hallucination None reported or observed  Judgment Impaired  Confusion None  Danger to Self  Current suicidal ideation? Denies  Danger to Others  Danger to Others None reported or observed

## 2023-10-15 NOTE — Plan of Care (Signed)
   Problem: Education: Goal: Emotional status will improve Outcome: Progressing Goal: Mental status will improve Outcome: Progressing   Problem: Activity: Goal: Sleeping patterns will improve Outcome: Progressing

## 2023-10-15 NOTE — Plan of Care (Addendum)
 Pt visible in milieu at brief intervals during shift. Remains tangential with labile mood, demanding and ambivalent on interactions. Denies SI, HI, AVH and pain when assessed. Refused his scheduled Invega injection as ordered. Took his scheduled medications with increased prompts. Tolerates meals and fluids well. Safety checks maintained at Q 15 minutes. Continued support, encouragement and reassurance offered.   Problem: Safety: Goal: Periods of time without injury will increase Outcome: Progressing   Problem: Physical Regulation: Goal: Ability to maintain clinical measurements within normal limits will improve Outcome: Progressing

## 2023-10-15 NOTE — Group Note (Signed)
 Recreation Therapy Group Note   Group Topic:Communication  Group Date: 10/15/2023 Start Time: 1015 End Time: 1055 Facilitators: Brietta Manso-McCall, LRT,CTRS Location: [address] Dayroom   Group Topic: Communication, Problem Solving   Goal Area(s) Addresses:  Patient will effectively listen to complete activity.  Patient will identify communication skills used to make activity successful.  Patient will identify how skills used during activity can be used to reach post d/c goals.    Intervention: Building surveyor Activity - Geometric pattern cards, pencils, blank paper    Activity: Geometric Drawings.  Three volunteers from the peer group will be shown an abstract picture with a particular arrangement of geometrical shapes.  Each round, one 'speaker' will describe the pattern, as accurately as possible without revealing the image to the group.  The remaining group members will listen and draw the picture to reflect how it is described to them. Patients with the role of 'listener' cannot ask clarifying questions but, may request that the speaker repeat a direction. Once the drawings are complete, the presenter will show the rest of the group the picture and compare how close each person came to drawing the picture. LRT will facilitate a post-activity discussion regarding effective communication and the importance of planning, listening, and asking for clarification in daily interactions with others. After which, LRT played music for the patients of their choosing.  Education: Environmental consultant, Active listening, Support systems, Discharge planning  Education Outcome: Acknowledges understanding/In group clarification offered/Needs additional education   Affect/Mood: N/A   Participation Level: Did not attend    Clinical Observations/Individualized Feedback:      Plan: Continue to engage patient in RT group sessions 2-3x/week.   Aleece Loyd-McCall, LRT,CTRS 10/15/2023  12:31 PM

## 2023-10-15 NOTE — Progress Notes (Signed)
 Pt given LAI Gean Birchwood per [facility] , pt refused it earlier, but stated he would take it for writer tonight

## 2023-10-15 NOTE — Progress Notes (Signed)
 Collateral contact - [facility], [place] Asters. [place], [phone]  Placement provider called and informed that he will visit patient tomorrow around lunchtime, or before, to assess if he is at baseline.  If so, placement provider is available to take him back to the group home on the same day.    Psychiatrist & primary care provider:  Rush Farmer, [place], [place].   Day program / therapy component is included:  Psychosocial Rehab (PSR) group, [facility], [address] [place], [place], [place], Monday - Friday.   Collateral contact - Lafonda Mosses from [facility] [phone] Patient is still on the waiting list.   Read Drivers, LCSWA 10/15/2023

## 2023-10-15 NOTE — [facility] Suicide Risk Assessment (Signed)
 [facility] INPATIENT:  Family/Significant Other Suicide Prevention Education  Suicide Prevention Education:  Education Completed; [facility], [place]if[place]on Asters. Humphrey, [phone], (name of family member/significant other) has been identified by the patient as the family member/significant other with whom the patient will be residing, and identified as the person(s) who will aid the patient in the event of a mental health crisis (suicidal ideations/suicide attempt).  With written consent from the patient, the family member/significant other has been provided the following suicide prevention education, prior to the and/or following the discharge of the patient.  Placement provider said that they don't have any guns or weapons.  Medications are locked up.  Patient is sharing a room with another resident at the group home.  Placement provider will visit patient tomorrow (Friday) around lunchtime, or earlier, to assess his condition and determine if he is able to return to the group home.  The suicide prevention education provided includes the following: Suicide risk factors Suicide prevention and interventions National Suicide Hotline telephone number [facility] assessment telephone number [facility] Emergency Assistance [address] [facility] and/or Residential Mobile Crisis Unit telephone number  Request made of family/significant other to: Remove weapons (e.g., guns, rifles, knives), all items previously/currently identified as safety concern.   Remove drugs/medications (over-the-counter, prescriptions, illicit drugs), all items previously/currently identified as a safety concern.  The family member/significant other verbalizes understanding of the suicide prevention education information provided.  The family member/significant other agrees to remove the items of safety concern listed above.  Ansleigh Safer O Wallace Cogliano, LCSWA 10/15/2023, 5:48 PM

## 2023-10-16 DIAGNOSIS — F250 Schizoaffective disorder, bipolar type: Secondary | ICD-10-CM | POA: Diagnosis not present

## 2023-10-16 MED ORDER — PALIPERIDONE PALMITATE ER 156 MG/ML IM SUSY
156.0000 mg | PREFILLED_SYRINGE | Freq: Once | INTRAMUSCULAR | Status: AC
  Administered 2023-10-19: 156 mg via INTRAMUSCULAR

## 2023-10-16 NOTE — Group Note (Signed)
 Date:  10/16/2023 Time:  9:22 AM  Group Topic/Focus:  Goals Group:   The focus of this group is to help patients establish daily goals to achieve during treatment and discuss how the patient can incorporate goal setting into their daily lives to aide in recovery. Orientation:   The focus of this group is to educate the patient on the purpose and policies of crisis stabilization and provide a format to answer questions about their admission.  The group details unit policies and expectations of patients while admitted.    Participation Level:  Minimal  Participation Quality:  Appropriate  Affect:  Flat  Cognitive:  Disorganized  Insight: Lacking  Engagement in Group:  Lacking  Modes of Intervention:  Discussion  Additional Comments:  Pt did not share a goal.   Lucilla Edin 10/16/2023, 9:22 AM

## 2023-10-16 NOTE — Progress Notes (Signed)
   10/16/23 0801  Psych Admission Type (Psych Patients Only)  Admission Status Involuntary  Psychosocial Assessment  Patient Complaints Suspiciousness  Eye Contact Fair  Facial Expression Anxious  Affect Preoccupied;Anxious  Speech Tangential  Interaction Guarded  Motor Activity Slow  Appearance/Hygiene Disheveled  Behavior Characteristics Cooperative  Mood Suspicious  Thought Process  Coherency Circumstantial;Concrete thinking  Content Blaming others  Delusions Persecutory  Perception Derealization  Hallucination None reported or observed  Judgment Impaired  Confusion Mild  Danger to Self  Current suicidal ideation? Denies  Agreement Not to Harm Self Yes  Description of Agreement verbal  Danger to Others  Danger to Others None reported or observed

## 2023-10-16 NOTE — Group Note (Signed)
 Recreation Therapy Group Note   Group Topic:Problem Solving  Group Date: 10/16/2023 Start Time: 1015 End Time: 1045 Facilitators: Kynlei Piontek-McCall, LRT,CTRS Location: [address] Dayroom   Group Topic: Problem Solving  Goal Area(s) Addresses:  Patient will effectively work in a team with other group members. Patient will verbalize importance of using appropriate problem solving techniques.  Patient will identify positive change associated with effective problem solving skills.   Intervention: Worksheets, Music  Activity: Dentist. Patients were given two worksheets that had 12 brain teasers on each page. Patients could work together to try and Teacher, music presented. Patients were given 20 minutes to complete the worksheets presented. LRT and patients went over the answers once patients finished.      Education: Problem Solving, Decision Making, Team Building  Education Outcome: Acknowledges understanding/In group clarification offered/Needs additional education.   Affect/Mood: Flat   Participation Level: Active   Participation Quality: Independent   Behavior: Appropriate   Speech/Thought Process: Disorganized   Insight: Lacking   Judgement: Lacking    Modes of Intervention: Worksheet   Patient Response to Interventions:  Engaged   Education Outcome:  In group clarification offered    Clinical Observations/Individualized Feedback: Pt was appropriate during group session. Pt worked on his own but was attentive to the answers of his peers. Pt gave a lot of random answers that made no sense and had nothing to do with the activity. Pt was social when prompted at time and would openly provide answers at other times.    Plan: Continue to engage patient in RT group sessions 2-3x/week.   Anthony Knox, LRT,CTRS 10/16/2023 12:29 PM

## 2023-10-16 NOTE — [facility] Group Notes (Signed)
 Spirituality group facilitated by Kathleen Argue, [facility].  Group Description: Group focused on topic of hope. Patients participated in facilitated discussion around topic, connecting with one another around experiences and definitions for hope. Group members engaged with visual explorer photos, reflecting on what hope looks like for them today. Group engaged in discussion around how their definitions of hope are present today in hospital.  Modalities: Psycho-social ed, Adlerian, Narrative, MI  Patient Progress: Anthony Knox attended group.  He participated in conversation, but at times it was difficult to connect his words to the theme and topic.

## 2023-10-16 NOTE — Progress Notes (Signed)
 [facility] Medical Student Progress Note  10/16/2023 12:02 PM Anthony Knox  MRN:  [id number]  Principal Problem: Schizophrenia [facility]) Diagnosis: Principal Problem:   Schizophrenia (HCC)   Reason for Admission:  Anthony Knox is a 63 y.o., male with a past psychiatric history significant for schizoaffective disorder, bipolar type admitted involuntarily to the [facility] from [facility] for evaluation and management of agitation in his group home and psychosis. (Patient was admitted on 10/03/2023, total  LOS: 13 days )  Chart Review from last 24 hours and interdisciplinary meeting:  The patient's chart was reviewed and nursing notes were reviewed. The patient's case was discussed in multidisciplinary team meeting.  Per social work, representative from group home is going to assess patient on 10/16/23 to determine if patient is at baseline and can return to the group home in the near future. Social work will reach out to confirm when representative is visiting. Per nursing, patient accepted and was administered long-acting injectable last night.   - Overnight events to report per chart review / staff report: no notable overnight events to report - Patient received all scheduled medications - Patient received the following PRN medications: Maalox/Mylanta and Hydroxyzine  Information Obtained Today During Patient Interview: The patient was seen and evaluated on the unit. On assessment today the patient reported his mood as feeling "a little bit better." He presented as A&O x 0 (stating his name as "aid to the king," place as "bank," time as "sufficial," and situation as "offering condolences to the devil"). Over the last 24 hours he reported trying to attend his group sessions "when he's not down and out" and that he has been "trying to catch what he's lost." He endorsed getting his long-acting injectable medication last night, stating his arm has been very sore ever since. He refused to elaborate  any further on his activities for the last 24 hours. When asked about his sleep he stated he has not had good sleep recently. Nursing reported he slept 7.5 hours. He reported his anxiety as "10 zillion" out of 10 (with 10 being the most severe) because he believes he is Anthony Knox's wife. He rated his depression as 5.7/10, going away briefly during the interview. When asked further questions about his anxiety and depression, the patient refused to respond. He reported that his energy levels have been low, feeling more exhausted. His endorsed pain at the medication injection site as well as in his trapezius muscles. He described his trapezius muscles as a bit tight, but denied any other pain. He denied SI and HI. He endorsed auditory hallucinations today stating that he has had them since the age of 45. He reported that he has a "bug in his mind blurting out something" and that the hallucinations tell him to "become an orange instead of a V." He denied visual hallucinations. He reported that his "medication is confusion" and endorsed side effects such as fatigue, dry mouth, and increased thirst. He denied any other side effects and presented with no obvious signs of EPS during the interview. He stated his appetite comes and goes and he stays hydrated as best as he can. He reported that his last bowel movement was "mind is a bathroom." He reported looking forward to seeing the representative from the group home today.   Collateral Information   Group home administrator [phone]), 10/14/2023: no response and voicemail box is full.   Legal Guardian [name], Anthony Knox - legal guardian [phone] , dial "[phone]"  for direct extension    10/14/23:  No response x2 today. Left a HIPAA compliant voice mail that would call tomorrow to provide updates, get consent for a medication, and discharge planning.   10/12/23 Amenable with transitioning from oral to long acting injectable of risperidone. She is  aware of possible need to gradually taper off oral risperidone after receiving injection. Also amenable with starting rivastigmine for suspected neurocognitive disorder and gabapentin for pain/irritability. Will continue to discuss dispo planning with group home prior to discharge.    10/09/23  Patient was previously discharged from hospitalization December 2024. She moved to his current group home in January, did fine for a few weeks and then started to decline. He was mean, irritable, argumentative, would make racial comments and was intermittently non-compliant with medications. He would manipulative and take meds whenever he wanted something from the staff. At his baseline he doesn't answer questions directly and answers in riddles. She suspects that he attempts to be preformative and won't answer others questions appropriately. She desires the patient to be on a long acting injectable given his prior history of medical non-compliance. She just recently picked up his case this year. She requests that primary team contact her prior to engaging with group home. She hasn't been able to speak with the group home and was unaware of the patient's admission.      Hospital Discharge meds from December 2024 include:    0.5 mg benztropine daily    Risperidone 200 mg q8w    Topamax 100 mg at bedtime    Trazodone 150 mg at bedtime     Loratadine 10 mg at bedtime    Senna 8.6 mg daily  Past Psychiatric History: (All pertinent psychiatric, medical, family, and social history obtained from previous progress notes and H&P) Previous psychiatric diagnoses: schizoaffective d/o Prior psychiatric treatment: collateral is unsure Psychiatric medication compliance history: poor   Current psychiatric treatment: collateral is unsure Current psychiatrist: collateral is unsure Current therapist: collateral is unsure   Previous hospitalizations yes, previously in [facility] for 8 months; per chart review was in [facility] in  2019 History of suicide attempts: denies History of self harm: denies  Past Medical History:  Past Medical History:  Diagnosis Date   Schizophrenia (HCC)     Family Psychiatric History:  Psych: brother committed suicide, other brother has ADHD and bipolar disorder   Social History:  Housing: lives in [facility] group home Education: finished 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 Weapons: denies  Current Medications: Current Facility-Administered Medications  Medication Dose Route Frequency Provider Last Rate Last Admin   acetaminophen (TYLENOL) tablet 650 mg  650 mg Oral Q6H PRN Meryl Dare, MD       alum & mag hydroxide-simeth (MAALOX/MYLANTA) 200-200-20 MG/5ML suspension 30 mL  30 mL Oral Q4H PRN Ophelia Shoulder E, NP   30 mL at 10/15/23 0400   haloperidol (HALDOL) tablet 5 mg  5 mg Oral TID PRN Chales Abrahams, NP       And   diphenhydrAMINE (BENADRYL) capsule 50 mg  50 mg Oral TID PRN Ophelia Shoulder E, NP       haloperidol lactate (HALDOL) injection 5 mg  5 mg Intramuscular TID PRN Ophelia Shoulder E, NP       And   diphenhydrAMINE (BENADRYL) injection 50 mg  50 mg Intramuscular TID PRN Ophelia Shoulder E, NP       And   LORazepam (ATIVAN) injection 2 mg  2 mg Intramuscular TID PRN Ophelia Shoulder E, NP       haloperidol lactate (HALDOL) injection 10 mg  10 mg Intramuscular TID PRN Chales Abrahams, NP       And   diphenhydrAMINE (BENADRYL) injection 50 mg  50 mg Intramuscular TID PRN Chales Abrahams, NP       And   LORazepam (ATIVAN) injection 2 mg  2 mg Intramuscular TID PRN Chales Abrahams, NP       docusate sodium (COLACE) capsule 100 mg  100 mg Oral Daily Peterson Ao, MD   100 mg at 10/16/23 0801   gabapentin (NEURONTIN) capsule 100 mg  100 mg Oral TID Peterson Ao, MD   100 mg at 10/16/23 0802   hydrOXYzine (ATARAX) tablet 25 mg  25 mg Oral TID PRN Kizzie Ide B, MD   25 mg at 10/15/23 2024   ibuprofen (ADVIL) tablet 400 mg  400 mg Oral Q4H  PRN Peterson Ao, MD       magnesium hydroxide (MILK OF MAGNESIA) suspension 30 mL  30 mL Oral Daily PRN Chales Abrahams, NP       [name] ON 10/18/2023] paliperidone (INVEGA SUSTENNA) injection 156 mg  156 mg Intramuscular Once Abbott Pao, Nadir, MD       risperiDONE (RISPERDAL) 1 MG/ML oral solution 1 mg  1 mg Oral Q12H Peterson Ao, MD   1 mg at 10/16/23 0801   Followed by   [name] ON 10/18/2023] risperiDONE (RISPERDAL) 1 MG/ML oral solution 0.5 mg  0.5 mg Oral Q12H Peterson Ao, MD       rivastigmine (EXELON) capsule 1.5 mg  1.5 mg Oral BID Peterson Ao, MD   1.5 mg at 10/16/23 0801   senna (SENOKOT) tablet 8.6 mg  1 tablet Oral Daily Peterson Ao, MD   8.6 mg at 10/16/23 0802   traZODone (DESYREL) tablet 100 mg  100 mg Oral QHS PRN Peterson Ao, MD   100 mg at 10/13/23 2035    Lab Results: No results found for this or any previous visit (from the past 48 hours).  Blood Alcohol level:  Lab Results  Component Value Date   ETH <10 09/29/2023   ETH <10 11/28/2021    Metabolic Labs: Lab Results  Component Value Date   HGBA1C 5.6 10/05/2023   MPG 114.02 10/05/2023   MPG 123 12/07/2020   Lab Results  Component Value Date   PROLACTIN 34.1 (H) 05/08/2018   Lab Results  Component Value Date   CHOL 162 10/05/2023   TRIG 54 10/05/2023   HDL 47 10/05/2023   CHOLHDL 3.4 10/05/2023   VLDL 11 10/05/2023   LDLCALC 104 (H) 10/05/2023   LDLCALC 127 (H) 12/07/2020    Physical Findings: AIMS: No  CIWA:    COWS:     Psychiatric Specialty Exam: General Appearance: Age-appropriate, disheveled male sitting on bench.   Eye Contact: Fair   Speech: Slightly slowed rate and cooperative with interview   Volume: Normal   Mood: "A little bit better"   Affect: Constricted and mood congruent   Thought Content: Patient presented with disorganized thought content from the beginning of the interview.  Thought Perceptions: Patient endorsed auditory hallucinations today, stating  he has had them since the age of 57. Denied visual hallucinations.   Suicidal Thoughts: Denied SI.   Homicidal Thoughts: Denied HI.   Thought Process: Patient presented with tangential and disorganized thought process from the beginning of the interview.   Orientation: Oriented x  0     Memory: Immediate Poor; Recent Poor; Remote Poor   Judgment: Poor   Insight: Poor   Concentration: Poor   Recall: Poor   Fund of Knowledge: Fair   Language: Good   Psychomotor Activity: Normal   Assets: Desire for Improvement; Housing   Sleep: Reported not sleeping well recently.    Review of Systems Filled out as completely as possible given patient's lack of response/inappropriate responses during review of systems. Review of Systems  Constitutional:  Positive for chills. Negative for diaphoresis and fever.  HENT:  Positive for sore throat. Negative for tinnitus.   Eyes:  Negative for blurred vision.  Respiratory:  Positive for shortness of breath. Negative for cough and wheezing.   Cardiovascular:  Negative for chest pain and palpitations.  Gastrointestinal:  Negative for constipation, diarrhea, nausea and vomiting.  Musculoskeletal:  Positive for myalgias.  Neurological:  Positive for headaches. Negative for dizziness.  Psychiatric/Behavioral:  Positive for depression and hallucinations. The patient is nervous/anxious.     Vital Signs: Blood pressure 121/72, pulse 80, temperature (!) 97.5 F (36.4 C), temperature source Oral, resp. rate 16, height 5\' 6"  (1.676 m), weight 66.2 kg, SpO2 99%. Body mass index is 23.57 kg/m.  Physical Exam Eyes:     Conjunctiva/sclera: Conjunctivae normal.  Pulmonary:     Effort: Pulmonary effort is normal.  Musculoskeletal:        General: Normal range of motion.     Cervical back: Normal range of motion.  Neurological:     Mental Status: He is alert. He is disoriented.     Assets  Assets: Desire for Improvement; Housing   Treatment  Plan Summary: Daily contact with patient to assess and evaluate symptoms and progress in treatment and Medication management   ASSESSMENT: RANDAL GOENS is a 63 y.o., male with a past psychiatric history significant for schizoaffective disorder, bipolar type admitted involuntarily to the [facility] from [facility] for evaluation and management of agitation in his group home and psychosis. (Patient was admitted on 10/03/2023, total  LOS: 13 days )  On assessment, patient continued to present pleasant, similar to previous days in his hospitalization. He presented as disorganized and tangential during most of the interview. He presented alert and oriented x 0. Similar to the past few days, he provided inappropriate or nonsensical responses to questions regarding his orientation, symptoms, activities, and medication efficacy. He was also unable to elaborate any further upon additional questioning. He was redirectable and continued to engage throughout the interview. He denied SI, HI, and visual hallucinations. He endorsed auditory hallucinations telling him to "be an orange instead of a V," but stated that he has had hallucinations since the age of 28. Patient endorsed receiving LAI last night.  We are still attempting to determine patient's baseline through conversations with the group home and patient's legal guardian. A representative from patient's group home was supposed to come by today to assess if patient is at baseline. Per attending via social work, the group home representative is no longer coming today. We will plan to administer second LAI dose in the coming days and attempt to determine patient's baseline. We will continue to monitor patient's condition and medication efficacy, attempt to contact legal guardian, discuss patient's baseline with the group home, and coordinate dispo planning with social work.   DIAGNOSIS: Schizophrenia  PLAN: Safety and Monitoring:  -- Involuntary  admission to inpatient psychiatric unit for safety, stabilization and treatment  -- Daily contact with  patient to assess and evaluate symptoms and progress in treatment  -- Patient's case to be discussed in multi-disciplinary team meeting  -- Observation Level : q15 minute checks  -- Vital signs:  q12 hours  -- Precautions: suicide, elopement, and assault  2. Interventions (medications, psychoeducation, etc):    -- Administer Paliperidone 156 mg IM x1 on 4/7 s/p Paliperidone 234 mg IM x1 on 4/3 for schizophrenia   -- Continue tapering PO Risperdal 3 mg every 12 hours for 3 more doses starting 03/31, then 2 mg every 12 hours for 4 doses, then 1 mg every 12 hours for 4 doses, and then 0.5 mg every 12 hours for 4 doses -- Continue Rivastigmine 1.5 mg twice daily for major neurocognitive disorder   -- Continue Gabapentin 100 mg three times daily for pain/irritability   -- Continue Docusate sodium 100 mg and senna 8.6 mg tablet daily for constipation   PRN medications for symptomatic management:              -- continue acetaminophen 650 mg every 6 hours as needed for mild to moderate pain, fever, and headaches              -- continue hydroxyzine 25 mg three times a day as needed for anxiety              -- continue bismuth subsalicylate 524 mg oral chewable tablet every 3 hours as needed for indigestion              -- continue senna 8.6 mg oral at bedtime as needed and polyethylene glycol 17 g oral daily as needed for mild to moderate constipation              -- continue ondansetron 8 mg every 8 hours as needed for nausea or vomiting              -- continue aluminum-magnesium hydroxide + simethicone 30 mL every 4 hours as needed for heartburn              -- continue trazodone 100 mg at bedtime as needed for insomnia  -- As needed agitation protocol in-place  The risks/benefits/side-effects/alternatives to the above medication were discussed in detail with the patient and time was given for  questions. The patient consents to medication trial. FDA black box warnings, if present, were discussed.  The patient is agreeable with the medication plan, as above. We will monitor the patient's response to pharmacologic treatment, and adjust medications as necessary.  3. Routine and other pertinent labs:             -- Metabolic profile:  BMI: Body mass index is 23.57 kg/m.  Prolactin: Lab Results  Component Value Date   PROLACTIN 34.1 (H) 05/08/2018    Lipid Panel: Lab Results  Component Value Date   CHOL 162 10/05/2023   TRIG 54 10/05/2023   HDL 47 10/05/2023   CHOLHDL 3.4 10/05/2023   VLDL 11 10/05/2023   LDLCALC 104 (H) 10/05/2023   LDLCALC 127 (H) 12/07/2020    HbgA1c: Hgb A1c MFr Bld (%)  Date Value  10/05/2023 5.6    TSH: TSH (uIU/mL)  Date Value  10/03/2023 4.287  07/15/2010 6.293 (H)    EKG monitoring: QTc: 399 w/PVC  4. Group Therapy:  -- Encouraged patient to participate in unit milieu and in scheduled group therapies   -- Short Term Goals: Ability to identify changes in lifestyle to reduce recurrence of condition, verbalize feelings,  identify and develop effective coping behaviors, maintain clinical measurements within normal limits, and identify triggers associated with substance abuse/mental health issues will improve. Improvement in ability to disclose and discuss suicidal ideas, demonstrate self-control, and comply with prescribed medications.  -- Long Term Goals: Improvement in symptoms so as ready for discharge -- Patient is encouraged to participate in group therapy while admitted to the psychiatric unit. -- We will address other chronic and acute stressors, which contributed to the patient's Schizophrenia (HCC) in order to reduce the risk of self-harm at discharge.  5. Discharge Planning:   -- Social work and case management to assist with discharge planning and identification of hospital follow-up needs prior to discharge  -- Estimated LOS:  pending determination of baseline on 4/4 by group home. 4/7-4/14  -- Discharge Concerns: Need to establish a safety plan; Medication compliance and effectiveness  -- Discharge Goals: Return home with outpatient referrals for mental health follow-up including medication management/psychotherapy  I certify that inpatient services furnished can reasonably be expected to improve the patient's condition.   Signed: Janith Lima, Medical Student 10/16/2023, 12:02 PM

## 2023-10-17 DIAGNOSIS — F250 Schizoaffective disorder, bipolar type: Secondary | ICD-10-CM | POA: Diagnosis not present

## 2023-10-17 NOTE — Group Note (Signed)
 Date:  10/17/2023 Time:  8:47 PM  Group Topic/Focus:  Wrap-Up Group:   The focus of this group is to help patients review their daily goal of treatment and discuss progress on daily workbooks.    Participation Level:  Did Not Attend   Scot Dock 10/17/2023, 8:47 PM

## 2023-10-17 NOTE — [facility] Group Notes (Signed)
 Adult Psychoeducational Group Note  Date:  10/17/2023 Time:  10:09 AM  Group Topic/Focus:  Goals Group:   The focus of this group is to help patients establish daily goals to achieve during treatment and discuss how the patient can incorporate goal setting into their daily lives to aide in recovery.  Participation Level:  Minimal  Participation Quality:  Resistant  Affect:  Labile  Cognitive:  Lacking  Insight: None  Engagement in Group:  Off Topic  Modes of Intervention:  Confrontation  Additional Comments:  THe patient attended group.  Octavio Manns 10/17/2023, 10:09 AM

## 2023-10-17 NOTE — Plan of Care (Signed)
   Problem: Education: Goal: Knowledge of [facility] Education information/materials will improve Outcome: Progressing Goal: Emotional status will improve Outcome: Progressing Goal: Mental status will improve Outcome: Progressing Goal: Verbalization of understanding the information provided will improve Outcome: Progressing

## 2023-10-17 NOTE — Progress Notes (Signed)
   10/17/23 0900  Psych Admission Type (Psych Patients Only)  Admission Status Involuntary  Psychosocial Assessment  Patient Complaints Suspiciousness  Eye Contact Fair  Facial Expression Animated  Affect Apprehensive  Speech Logical/coherent  Interaction Guarded  Motor Activity Fidgety  Appearance/Hygiene Disheveled  Behavior Characteristics Cooperative  Mood Pleasant  Thought Process  Coherency WDL  Content Blaming others  Delusions None reported or observed  Perception WDL  Hallucination None reported or observed  Judgment Limited  Confusion Mild  Danger to Self  Current suicidal ideation? Denies  Agreement Not to Harm Self Yes  Description of Agreement verbal  Danger to Others  Danger to Others None reported or observed

## 2023-10-17 NOTE — Plan of Care (Signed)
   Problem: Activity: Goal: Interest or engagement in activities will improve Outcome: Progressing   Problem: Coping: Goal: Ability to verbalize frustrations and anger appropriately will improve Outcome: Progressing   Problem: Safety: Goal: Periods of time without injury will increase Outcome: Progressing

## 2023-10-17 NOTE — Progress Notes (Signed)
 Patient refused lunch and dinner. Staff encourage patient to eat, patient stated "kitchen is not hygenic and I am not working for you" patient continued to refused meals. Patient ate snacks and drank fluids while on unit.

## 2023-10-17 NOTE — Progress Notes (Signed)
 [facility] MD Progress Note  10/17/2023 10:25 AM Anthony Knox  MRN:  [id number]   Reason for Admission:  Anthony Knox is a 63 y.o., male with a past psychiatric history significant for schizoaffective disorder, bipolar type admitted involuntarily to the [facility] from [facility] for evaluation and management of agitation in his group home and psychosis. The patient is currently on Hospital Day 14.   Chart Review from last 24 hours:  The patient's chart was reviewed and nursing notes were reviewed. The patient's case was discussed in multidisciplinary team meeting. Per [facility] patient is compliant with medications on the unit.  As needed Atarax 25 mg for anxiety used average once daily, as needed trazodone 100 mg at bedtime for sleep was used last 4/1.  Information Obtained Today During Patient Interview: The patient was seen and evaluated on the unit. On assessment today the patient continues to present alert with disorientation answering the questions about orientation or otherwise in a bizarre nonlinear manner with occasional linear answers for example when I asked him if he had good sleep he responds "yes I was exhausted" when I ask him about his mood this morning he responds in a disorganized nonlinear manner "I just snack comments from others" he denies SI HI or AVH with a clear no response, denies paranoia.  He later makes a bizarre statement "I had my arm amputated briefly" but with further discussion I asked him if he is referring to his arm pain resulting from his injection 2 nights ago he responds "yes" he does not appear responding to stimuli and had no agitation incidents reported, no paranoia noted during evaluation and he appears to be interacting well with the milieu but disorganized and disoriented as noted above which I believe is his baseline.  Unfortunately we have been waiting for the group home staff where he resides to come and assess him and he did not show up on Thursday or  Friday as planned, will follow.   Sleep  Sleep: Fair sleep reported  Principal Problem: Schizophrenia (HCC) Diagnosis: Principal Problem:   Schizophrenia (HCC)    Past Psychiatric History: See H&P  Past Medical History:  Past Medical History:  Diagnosis Date   Schizophrenia (HCC)    History reviewed. No pertinent surgical history. Family History: History reviewed. No pertinent family history. Family Psychiatric  History: See H&P Social History: See H&P  Current Medications: Current Facility-Administered Medications  Medication Dose Route Frequency Provider Last Rate Last Admin   acetaminophen (TYLENOL) tablet 650 mg  650 mg Oral Q6H PRN Meryl Dare, MD       alum & mag hydroxide-simeth (MAALOX/MYLANTA) 200-200-20 MG/5ML suspension 30 mL  30 mL Oral Q4H PRN Ophelia Shoulder E, NP   30 mL at 10/15/23 0400   haloperidol (HALDOL) tablet 5 mg  5 mg Oral TID PRN Chales Abrahams, NP       And   diphenhydrAMINE (BENADRYL) capsule 50 mg  50 mg Oral TID PRN Chales Abrahams, NP       haloperidol lactate (HALDOL) injection 5 mg  5 mg Intramuscular TID PRN Ophelia Shoulder E, NP       And   diphenhydrAMINE (BENADRYL) injection 50 mg  50 mg Intramuscular TID PRN Ophelia Shoulder E, NP       And   LORazepam (ATIVAN) injection 2 mg  2 mg Intramuscular TID PRN Ophelia Shoulder E, NP       haloperidol lactate (HALDOL) injection 10 mg  10  mg Intramuscular TID PRN Chales Abrahams, NP       And   diphenhydrAMINE (BENADRYL) injection 50 mg  50 mg Intramuscular TID PRN Chales Abrahams, NP       And   LORazepam (ATIVAN) injection 2 mg  2 mg Intramuscular TID PRN Chales Abrahams, NP       docusate sodium (COLACE) capsule 100 mg  100 mg Oral Daily Peterson Ao, MD   100 mg at 10/17/23 8416   gabapentin (NEURONTIN) capsule 100 mg  100 mg Oral TID Peterson Ao, MD   100 mg at 10/17/23 6063   hydrOXYzine (ATARAX) tablet 25 mg  25 mg Oral TID PRN Kizzie Ide B, MD   25 mg at 10/16/23 2033   ibuprofen  (ADVIL) tablet 400 mg  400 mg Oral Q4H PRN Peterson Ao, MD       magnesium hydroxide (MILK OF MAGNESIA) suspension 30 mL  30 mL Oral Daily PRN Chales Abrahams, NP       [name] ON 10/19/2023] paliperidone (INVEGA SUSTENNA) injection 156 mg  156 mg Intramuscular Once Abbott Pao, Azaryah Oleksy, MD       risperiDONE (RISPERDAL) 1 MG/ML oral solution 1 mg  1 mg Oral Q12H Peterson Ao, MD   1 mg at 10/17/23 0160   Followed by   [name] ON 10/18/2023] risperiDONE (RISPERDAL) 1 MG/ML oral solution 0.5 mg  0.5 mg Oral Q12H Peterson Ao, MD       rivastigmine (EXELON) capsule 1.5 mg  1.5 mg Oral BID Peterson Ao, MD   1.5 mg at 10/17/23 1093   senna (SENOKOT) tablet 8.6 mg  1 tablet Oral Daily Peterson Ao, MD   8.6 mg at 10/17/23 2355   traZODone (DESYREL) tablet 100 mg  100 mg Oral QHS PRN Peterson Ao, MD   100 mg at 10/13/23 2035    Lab Results: No results found for this or any previous visit (from the past 48 hours).  Blood Alcohol level:  Lab Results  Component Value Date   ETH <10 09/29/2023   ETH <10 11/28/2021    Metabolic Disorder Labs: Lab Results  Component Value Date   HGBA1C 5.6 10/05/2023   MPG 114.02 10/05/2023   MPG 123 12/07/2020   Lab Results  Component Value Date   PROLACTIN 34.1 (H) 05/08/2018   Lab Results  Component Value Date   CHOL 162 10/05/2023   TRIG 54 10/05/2023   HDL 47 10/05/2023   CHOLHDL 3.4 10/05/2023   VLDL 11 10/05/2023   LDLCALC 104 (H) 10/05/2023   LDLCALC 127 (H) 12/07/2020    Physical Findings: AIMS: Facial and Oral Movements Muscles of Facial Expression: None Lips and Perioral Area: None Jaw: None Tongue: None,Extremity Movements Upper (arms, wrists, hands, fingers): None Lower (legs, knees, ankles, toes): None, Trunk Movements Neck, shoulders, hips: None, Global Judgements Severity of abnormal movements overall : None Incapacitation due to abnormal movements: None Patient's awareness of abnormal movements: No Awareness, Dental  Status Current problems with teeth and/or dentures?: No Does patient usually wear dentures?: No Edentia?: No  CIWA:    COWS:     Musculoskeletal: Strength & Muscle Tone: within normal limits Gait & Station: normal Patient leans: N/A  Psychiatric Specialty Exam:  General Appearance: Dressed in casual clothes, slightly disheveled and unkempt  Behavior: Cooperative in general but occasionally seems guarded  Psychomotor Activity: No psychomotor agitation but mild retardation noted  Eye Contact: Fair Speech: Decreased amount and speed but normal tone and volume  Mood: Vague disorganized response to mood question Affect: Restricted  Thought Process: Nonlinear and disorganized with occasional linear concrete responses Descriptions of Associations: Disorganized, sometimes concrete Thought Content: Hallucinations: Denies AH, VH, does not appear responding to stimuli Delusions: No paranoia but bizarre delusions noted related to disorganized thought process Suicidal Thoughts: Denies passive or active SI, intention, plan  Homicidal Thoughts: Denied eyes HI, intention, plan   Alertness/Orientation: Alert but disoriented  Insight: Poor Judgment: Poor  Memory: Poor  Executive Functions  Concentration: Limited, easily distracted Attention Span: Poor Recall: Poor Fund of Knowledge: Poor   Assets  Assets: Desire for Improvement; Housing    Physical Exam: Physical Exam Vitals and nursing note reviewed.    Review of Systems  All other systems reviewed and are negative.  Blood pressure 122/89, pulse 77, temperature 98 F (36.7 C), temperature source Oral, resp. rate 16, height 5\' 6"  (1.676 m), weight 66.2 kg, SpO2 99%. Body mass index is 23.57 kg/m.   Treatment Plan Summary: Daily contact with patient to assess and evaluate symptoms and progress in treatment and Medication management  ASSESSMENT:  Diagnoses / Active Problems: Principal Problem: Schizophrenia  (HCC) Diagnosis: Principal Problem:   Schizophrenia (HCC)   PLAN: Safety and Monitoring:  -- Involuntary admission to inpatient psychiatric unit for safety, stabilization and treatment  -- Daily contact with patient to assess and evaluate symptoms and progress in treatment  -- Patient's case to be discussed in multi-disciplinary team meeting  -- Observation Level : q15 minute checks  -- Vital signs:  q12 hours  -- Precautions: suicide, elopement, and assault  2. Medications:   Continue with plan to taper down oral risperidone currently on 1 mg twice daily to be decreased to 0.5 mg twice daily starting Sunday 4/6.  He received first shot of [place] 234 mg on Thursday/3 with plan to receive second loading dose 156 mg on 4/7  Continue rivastigmine 1.5 mg twice daily for memory problem  Continue gabapentin 100 mg 3 times daily  Continue Colace 100 mg daily for constipation and Senokot 8.6 mg daily for constipation  Continue agitation protocol  Continue Atarax 25 mg 3 times daily as needed for anxiety  Continue trazodone 100 mg at bedtime as needed for sleep --  The risks/benefits/side-effects/alternatives to this medication were discussed in detail with the patient and time was given for questions. The patient consents to medication trial.    -- Metabolic profile and EKG monitoring obtained while on an atypical antipsychotic (BMI: Lipid Panel: HbgA1c: QTc:)      3. Pertinent labs: Labwork reviewed      Lab ordered: None  4. Group and Therapy: -- Encouraged patient to participate in unit milieu and in scheduled group therapies      -- Short Term Goals: Ability to identify changes in lifestyle to reduce recurrence of condition will improve and Ability to verbalize feelings will improve  -- Long Term Goals: Improvement in symptoms so as ready for discharge  6. Discharge Planning:   -- Social work and case management to assist with discharge planning and identification of  hospital follow-up needs prior to discharge  -- Estimated LOS: 5-7 days  -- Discharge Concerns: Need to establish a safety plan; Medication compliance and effectiveness  -- Discharge Goals: Return home with outpatient referrals for mental health follow-up including medication management/psychotherapy      Total Time Spent in Direct Patient Care:  I personally spent 35 minutes on the unit in direct patient care. The direct  patient care time included face-to-face time with the patient, reviewing the patient's chart, communicating with other professionals, and coordinating care. Greater than 50% of this time was spent in counseling or coordinating care with the patient regarding goals of hospitalization, psycho-education, and discharge planning needs.   Matilde Markie Abbott Pao, MD 10/17/2023, 10:25 AM

## 2023-10-17 NOTE — Progress Notes (Signed)
   10/17/23 2123  Psych Admission Type (Psych Patients Only)  Admission Status Involuntary  Psychosocial Assessment  Patient Complaints Suspiciousness  Eye Contact Avoids  Facial Expression Flat  Affect Blunted  Speech Logical/coherent;Tangential  Interaction Isolative  Motor Activity Slow  Appearance/Hygiene Disheveled  Behavior Characteristics Appropriate to situation;Cooperative  Mood Preoccupied  Thought Process  Coherency WDL  Content Blaming others  Delusions None reported or observed  Perception Derealization  Hallucination None reported or observed  Judgment Impaired  Confusion Mild  Danger to Self  Current suicidal ideation? Denies

## 2023-10-18 DIAGNOSIS — F250 Schizoaffective disorder, bipolar type: Secondary | ICD-10-CM | POA: Diagnosis not present

## 2023-10-18 NOTE — Plan of Care (Signed)
  Problem: Coping: Goal: Ability to demonstrate self-control will improve Outcome: Progressing   Problem: Education: Goal: Mental status will improve Outcome: Not Progressing   Problem: Activity: Goal: Interest or engagement in activities will improve Outcome: Not Progressing

## 2023-10-18 NOTE — Progress Notes (Signed)
 RN asked patient if he had a BM. Patient responded "I do it in an out house" RN educated patient on use of toilet in his room and showed patient how to flush toilet after use. Patient insisted on having an out house to use and was unable to state if he had a BM.

## 2023-10-18 NOTE — Group Note (Signed)
 Date:  10/18/2023 Time:  8:56 PM  Group Topic/Focus:  Wrap-Up Group:   The focus of this group is to help patients review their daily goal of treatment and discuss progress on daily workbooks.    Participation Level:  Active  Participation Quality:  Redirectable and Resistant  Affect:  Flat and Irritable   Cognitive:  Disorganized  Insight: Limited  Engagement in Group:  Off Topic  Modes of Intervention:  Discussion  Additional Comments:   Pt states that his day hasn't been so good due to his drowsiness. Pt was irritable with writers questioning and refused to answer. Pt did state he is not getting along with his doctors because "they dont listen".   Vevelyn Pat 10/18/2023, 8:56 PM

## 2023-10-18 NOTE — [facility] Group Notes (Signed)
 Adult Psychoeducational Group Note  Date:  10/18/2023 Time:  1:16 PM  Group Topic/Focus:  Goals Group:   The focus of this group is to help patients establish daily goals to achieve during treatment and discuss how the patient can incorporate goal setting into their daily lives to aide in recovery. Orientation:   The focus of this group is to educate the patient on the purpose and policies of crisis stabilization and provide a format to answer questions about their admission.  The group details unit policies and expectations of patients while admitted.  Participation Level:  Did Not Attend  Participation Quality:    Affect:    Cognitive:    Insight:   Engagement in Group:    Modes of Intervention:    Additional Comments:    Sheran Lawless 10/18/2023, 1:16 PM

## 2023-10-18 NOTE — Progress Notes (Signed)
   10/18/23 0800  Psych Admission Type (Psych Patients Only)  Admission Status Involuntary  Psychosocial Assessment  Patient Complaints Suspiciousness  Eye Contact Avoids  Facial Expression Flat  Affect Blunted  Speech Word salad  Interaction Guarded  Motor Activity Fidgety  Appearance/Hygiene Disheveled  Behavior Characteristics Guarded  Mood Preoccupied  Thought Process  Coherency WDL  Content Blaming others  Delusions None reported or observed  Perception Derealization  Hallucination None reported or observed  Judgment Limited  Confusion Mild  Danger to Self  Current suicidal ideation? Denies

## 2023-10-18 NOTE — Progress Notes (Signed)
   10/18/23 2030  Psychosocial Assessment  Patient Complaints Irritability  Eye Contact Avoids  Facial Expression Flat  Affect Blunted  Speech Logical/coherent;Tangential  Interaction Isolative  Motor Activity Slow  Appearance/Hygiene Disheveled  Behavior Characteristics Agitated;Irritable  Mood Preoccupied  Thought Process  Coherency Loose associations;Tangential  Content Blaming others  Delusions None reported or observed  Perception Derealization  Hallucination None reported or observed  Judgment Impaired  Confusion Mild  Danger to Self  Current suicidal ideation? Denies

## 2023-10-18 NOTE — [facility] Group Notes (Signed)
 [facility] Group Notes:  (Nursing/MHT/Case Management/Adjunct)  Date:  10/18/2023  Time:  10:02 AM  Type of Therapy:  Psychoeducational Skills  Participation Level:  Did Not Attend    Audrie Lia Kristeen Lantz 10/18/2023, 10:02 AM

## 2023-10-18 NOTE — Plan of Care (Signed)
  Problem: Activity: Goal: Interest or engagement in activities will improve Outcome: Progressing Goal: Sleeping patterns will improve Outcome: Progressing   Problem: Coping: Goal: Ability to verbalize frustrations and anger appropriately will improve Outcome: Progressing   

## 2023-10-18 NOTE — Progress Notes (Signed)
 [facility] MD Progress Note  10/19/2023 12:34 PM Anthony Knox  MRN:  [id number]   Reason for Admission:  Anthony Knox is a 63 y.o., male with a past psychiatric history significant for schizoaffective disorder, bipolar type admitted involuntarily to the [facility] from [facility] for evaluation and management of agitation in his group home and psychosis. The patient is currently on Hospital Day 16.   Chart Review from last 24 hours:  The patient's chart was reviewed and nursing notes were reviewed. The patient's case was discussed in multidisciplinary team meeting.  Patient has been adherent to medications and has not used any as needed medications.  Information Obtained Today During Patient Interview: Patient was evaluated in his room.  Reported that no one has visited him over the weekend.  He reports that his sleep is fine.  He reports that his appetite is fine but then says "food is same old nutrium and elexor."  Patient continues to be pleasantly confused and disorganized.  Discussed with patient doing the second dose of long-acting injectable and he was agreeable.  Discussed that we would talk to pharmacy to get the medication and then give it to him later in the morning.  When he was approached later in the morning to get the shot, he started yelling and adamantly rejected it.  Patient continues to have delusional thoughts but denies having AVH.  Denies SI and HI.  Sleep  Sleep: Fair sleep reported  Principal Problem: Schizophrenia (HCC) Diagnosis: Principal Problem:   Schizophrenia (HCC)    Past Psychiatric History: See H&P  Past Medical History:  Past Medical History:  Diagnosis Date   Schizophrenia (HCC)    History reviewed. No pertinent surgical history. Family History: History reviewed. No pertinent family history. Family Psychiatric  History: See H&P Social History: See H&P  Current Medications: Current Facility-Administered Medications  Medication Dose Route  Frequency Provider Last Rate Last Admin   acetaminophen (TYLENOL) tablet 650 mg  650 mg Oral Q6H PRN Meryl Dare, MD       alum & mag hydroxide-simeth (MAALOX/MYLANTA) 200-200-20 MG/5ML suspension 30 mL  30 mL Oral Q4H PRN Ophelia Shoulder E, NP   30 mL at 10/15/23 0400   haloperidol (HALDOL) tablet 5 mg  5 mg Oral TID PRN Chales Abrahams, NP       And   diphenhydrAMINE (BENADRYL) capsule 50 mg  50 mg Oral TID PRN Chales Abrahams, NP       haloperidol lactate (HALDOL) injection 5 mg  5 mg Intramuscular TID PRN Chales Abrahams, NP       And   diphenhydrAMINE (BENADRYL) injection 50 mg  50 mg Intramuscular TID PRN Chales Abrahams, NP       And   LORazepam (ATIVAN) injection 2 mg  2 mg Intramuscular TID PRN Ophelia Shoulder E, NP       haloperidol lactate (HALDOL) injection 10 mg  10 mg Intramuscular TID PRN Ophelia Shoulder E, NP       And   diphenhydrAMINE (BENADRYL) injection 50 mg  50 mg Intramuscular TID PRN Chales Abrahams, NP       And   LORazepam (ATIVAN) injection 2 mg  2 mg Intramuscular TID PRN Ophelia Shoulder E, NP       docusate sodium (COLACE) capsule 100 mg  100 mg Oral Daily Peterson Ao, MD   100 mg at 10/19/23 0811   gabapentin (NEURONTIN) capsule 100 mg  100 mg Oral TID Birdena Crandall,  Domenic Schwab, MD   100 mg at 10/19/23 1610   hydrOXYzine (ATARAX) tablet 25 mg  25 mg Oral TID PRN Kizzie Ide B, MD   25 mg at 10/18/23 2027   ibuprofen (ADVIL) tablet 400 mg  400 mg Oral Q4H PRN Peterson Ao, MD       magnesium hydroxide (MILK OF MAGNESIA) suspension 30 mL  30 mL Oral Daily PRN Chales Abrahams, NP       paliperidone (INVEGA SUSTENNA) injection 156 mg  156 mg Intramuscular Once Abbott Pao, Nadir, MD       risperiDONE (RISPERDAL) 1 MG/ML oral solution 0.5 mg  0.5 mg Oral Q12H Peterson Ao, MD   0.5 mg at 10/19/23 0810   rivastigmine (EXELON) capsule 1.5 mg  1.5 mg Oral BID Peterson Ao, MD   1.5 mg at 10/19/23 9604   senna (SENOKOT) tablet 8.6 mg  1 tablet Oral Daily Peterson Ao, MD    8.6 mg at 10/18/23 5409   traZODone (DESYREL) tablet 100 mg  100 mg Oral QHS PRN Peterson Ao, MD   100 mg at 10/18/23 2027    Lab Results: No results found for this or any previous visit (from the past 48 hours).  Blood Alcohol level:  Lab Results  Component Value Date   ETH <10 09/29/2023   ETH <10 11/28/2021    Metabolic Disorder Labs: Lab Results  Component Value Date   HGBA1C 5.6 10/05/2023   MPG 114.02 10/05/2023   MPG 123 12/07/2020   Lab Results  Component Value Date   PROLACTIN 34.1 (H) 05/08/2018   Lab Results  Component Value Date   CHOL 162 10/05/2023   TRIG 54 10/05/2023   HDL 47 10/05/2023   CHOLHDL 3.4 10/05/2023   VLDL 11 10/05/2023   LDLCALC 104 (H) 10/05/2023   LDLCALC 127 (H) 12/07/2020    Physical Findings: AIMS: Facial and Oral Movements Muscles of Facial Expression: None Lips and Perioral Area: None Jaw: None Tongue: None,Extremity Movements Upper (arms, wrists, hands, fingers): None Lower (legs, knees, ankles, toes): None, Trunk Movements Neck, shoulders, hips: None, Global Judgements Severity of abnormal movements overall : None Incapacitation due to abnormal movements: None Patient's awareness of abnormal movements: No Awareness, Dental Status Current problems with teeth and/or dentures?: No Does patient usually wear dentures?: No Edentia?: No  CIWA:    COWS:     Musculoskeletal: Strength & Muscle Tone: within normal limits Gait & Station: normal Patient leans: N/A  Psychiatric Specialty Exam:  General Appearance: Dressed in casual clothes, slightly disheveled and unkempt  Behavior: Cooperative in general but occasionally seems guarded  Psychomotor Activity: No psychomotor agitation but mild retardation noted  Eye Contact: Fair Speech: Decreased amount and speed but normal tone and volume   Mood: Vague disorganized response to mood question Affect: Restricted  Thought Process: Nonlinear and disorganized with  occasional linear concrete responses Descriptions of Associations: Disorganized, sometimes concrete Thought Content: Hallucinations: Denies AH, VH, does not appear responding to stimuli Delusions: No paranoia but bizarre delusions noted related to disorganized thought process Suicidal Thoughts: Denies passive or active SI, intention, plan  Homicidal Thoughts: Denied eyes HI, intention, plan   Alertness/Orientation: Alert but disoriented  Insight: Poor Judgment: Poor  Memory: Poor  Executive Functions  Concentration: Limited, easily distracted Attention Span: Poor Recall: Poor Fund of Knowledge: Poor   Assets  Assets: Housing    Physical Exam: Physical Exam Vitals and nursing note reviewed.  HENT:     Head: Normocephalic and atraumatic.  Pulmonary:     Effort: Pulmonary effort is normal.  Neurological:     General: No focal deficit present.     Mental Status: He is alert.  Psychiatric:     Comments: No obvious EPS.    Review of Systems  Constitutional:  Negative for fever.  Cardiovascular:  Negative for chest pain and palpitations.  Gastrointestinal:  Negative for constipation, diarrhea, nausea and vomiting.  Neurological:  Negative for dizziness, weakness and headaches.  All other systems reviewed and are negative.  Blood pressure 99/77, pulse 77, temperature 98.4 F (36.9 C), temperature source Oral, resp. rate 16, height 5\' 6"  (1.676 m), weight 66.2 kg, SpO2 97%. Body mass index is 23.57 kg/m.   Treatment Plan Summary: Daily contact with patient to assess and evaluate symptoms and progress in treatment and Medication management  ASSESSMENT:  Diagnoses / Active Problems: Principal Problem: Schizophrenia (HCC) Diagnosis: Principal Problem:   Schizophrenia (HCC)  Group home supposed to come today and visit but they did not.  The legal guardian is investigating what next as will be.  Social work will continue to determine discharge planning.  Patient  continues to decline second long-acting injectable for inconsistent reasons but is adherent to oral medication.  No change in patient's mental status at this time.  PLAN: Safety and Monitoring:  -- Involuntary admission to inpatient psychiatric unit for safety, stabilization and treatment  -- Daily contact with patient to assess and evaluate symptoms and progress in treatment  -- Patient's case to be discussed in multi-disciplinary team meeting  -- Observation Level : q15 minute checks  -- Vital signs:  q12 hours  -- Precautions: suicide, elopement, and assault  2. Medications:   Continue with plan to taper down oral risperidone currently on 1 mg twice daily to be decreased to 0.5 mg twice daily starting Sunday 4/6.  He received first shot of [place] 234 mg on Thursday/3 with plan to receive second loading dose 156 mg on 4/7  Continue rivastigmine 1.5 mg twice daily for memory problem  Continue gabapentin 100 mg 3 times daily  Continue Colace 100 mg daily for constipation and Senokot 8.6 mg daily for constipation  Continue agitation protocol  Continue Atarax 25 mg 3 times daily as needed for anxiety  Continue trazodone 100 mg at bedtime as needed for sleep --  The risks/benefits/side-effects/alternatives to this medication were discussed in detail with the patient and time was given for questions. The patient consents to medication trial.    -- Metabolic profile and EKG monitoring obtained while on an atypical antipsychotic (BMI: Lipid Panel: HbgA1c: QTc:)      3. Pertinent labs: Labwork reviewed      Lab ordered: None  4. Group and Therapy: -- Encouraged patient to participate in unit milieu and in scheduled group therapies      -- Short Term Goals: Ability to identify changes in lifestyle to reduce recurrence of condition will improve and Ability to verbalize feelings will improve  -- Long Term Goals: Improvement in symptoms so as ready for discharge  6. Discharge  Planning:   -- Social work and case management to assist with discharge planning and identification of hospital follow-up needs prior to discharge  -- Estimated LOS: 5-7 days  -- Discharge Concerns: Need to establish a safety plan; Medication compliance and effectiveness  -- Discharge Goals: Return home with outpatient referrals for mental health follow-up including medication management/psychotherapy   Meryl Dare, MD 10/19/2023, 12:34 PM

## 2023-10-19 ENCOUNTER — Encounter (HOSPITAL_COMMUNITY): Payer: Self-pay

## 2023-10-19 DIAGNOSIS — F250 Schizoaffective disorder, bipolar type: Secondary | ICD-10-CM | POA: Diagnosis not present

## 2023-10-19 NOTE — Plan of Care (Signed)
  Problem: Activity: Goal: Interest or engagement in activities will improve Outcome: Progressing   Problem: Safety: Goal: Periods of time without injury will increase Outcome: Progressing

## 2023-10-19 NOTE — Progress Notes (Signed)
   10/19/23 1100  Psych Admission Type (Psych Patients Only)  Admission Status Involuntary  Psychosocial Assessment  Patient Complaints Irritability  Eye Contact Intense  Facial Expression Flat  Affect Labile  Speech Tangential  Interaction Isolative  Motor Activity Slow  Appearance/Hygiene Disheveled  Behavior Characteristics Agitated;Irritable  Mood Preoccupied  Thought Process  Coherency Loose associations;Tangential  Content Blaming others  Delusions None reported or observed  Perception Derealization  Hallucination None reported or observed  Judgment Impaired  Confusion Mild  Danger to Self  Current suicidal ideation? Denies  Agreement Not to Harm Self Yes  Description of Agreement verbal  Danger to Others  Danger to Others None reported or observed

## 2023-10-19 NOTE — Group Note (Signed)
 LCSW Group Therapy Note   Group Date: 10/19/2023 Start Time: 2:00 PM End Time: 1:00 PM   Participation:  patient was present.  He listened and was respectful, but didn't participate in the discussion.  Type of Therapy:  Group Therapy  Title:  "Shining from Within: Confidence and Self-Love Journey"  Objective:  The focus of today's session is to explore how confidence and self-love can be nurtured over time through self-compassion, recognizing strengths, and taking small, intentional steps.  Goals: Foster self-love and acceptance by embracing strengths and imperfections. Develop confidence through actionable steps and mindset shifts. Practice patience during personal growth, acknowledging setbacks as part of the journey.  Summary:  This session focused on self-love as the foundation for confidence.  Participants practiced helpful self-talk, identified strengths, set small goals, and reflected on achievements and social support.  It emphasized that building confidence is a continuous process requiring patience and self-care.  Therapeutic Modalities: Cognitive Behavioral Therapy (CBT): Used to challenge and replace unhelpful self-talk with more supportive thoughts, enhancing self-esteem. Mindfulness and Self-Compassion Practices: Encourages reflection on strengths, gratitude, and the creation of a positive environment to foster a sense of well-being.   Alla Feeling, LCSWA 10/19/2023  5:48 PM

## 2023-10-19 NOTE — Group Note (Signed)
 Date:  10/19/2023 Time:  9:56 AM  Group Topic/Focus:  Goals Group:   The focus of this group is to help patients establish daily goals to achieve during treatment and discuss how the patient can incorporate goal setting into their daily lives to aide in recovery.    Participation Level:  Did Not Attend  Margaret Pyle 10/19/2023, 9:56 AM

## 2023-10-19 NOTE — Progress Notes (Signed)
 Pt yelled in hallway "No" when asked to take scheduled invega injection medication. Dr. Sarita Bottom aware and present.

## 2023-10-19 NOTE — Group Note (Signed)
 Date:  10/19/2023 Time:  8:52 PM  Group Topic/Focus:  Wrap-Up Group:   The focus of this group is to help patients review their daily goal of treatment and discuss progress on daily workbooks.    Participation Level:  Minimal  Participation Quality:  Redirectable and Resistant  Affect:  Flat and Irritable  Cognitive:  Disorganized and Delusional  Insight: Lacking  Engagement in Group:  Poor  Modes of Intervention:  Discussion  Additional Comments:   Pt would not directly answer any of writers questions and pts responses were hard to follow. Pt denied everything.  Anthony Knox 10/19/2023, 8:52 PM

## 2023-10-19 NOTE — Progress Notes (Signed)
 Writer convinced pt to take his LAI 2nd shot tonight so his Gean [place] 156mg  was given per [facility].      10/19/23 2100  Psych Admission Type (Psych Patients Only)  Admission Status Involuntary  Psychosocial Assessment  Patient Complaints Suspiciousness  Eye Contact Fair  Facial Expression Flat  Affect Labile  Speech Tangential  Interaction Guarded  Motor Activity Slow  Appearance/Hygiene Disheveled  Behavior Characteristics Anxious  Mood Suspicious  Thought Process  Coherency Loose associations;Tangential  Content Blaming others  Delusions None reported or observed  Perception Derealization  Hallucination None reported or observed  Judgment Impaired  Confusion Mild  Danger to Self  Current suicidal ideation? Denies  Danger to Others  Danger to Others None reported or observed

## 2023-10-19 NOTE — Group Note (Unsigned)
 Date:  10/19/2023 Time:  8:41 PM  Group Topic/Focus:  Wrap-Up Group:   The focus of this group is to help patients review their daily goal of treatment and discuss progress on daily workbooks.     Participation Level:  {BHH PARTICIPATION MWUXL:24401}  Participation Quality:  {BHH PARTICIPATION QUALITY:22265}  Affect:  {BHH AFFECT:22266}  Cognitive:  {BHH COGNITIVE:22267}  Insight: {BHH Insight2:20797}  Engagement in Group:  {BHH ENGAGEMENT IN UUVOZ:36644}  Modes of Intervention:  {BHH MODES OF INTERVENTION:22269}  Additional Comments:  ***  Scot Dock 10/19/2023, 8:41 PM

## 2023-10-19 NOTE — Progress Notes (Addendum)
 Collateral contact - [facility] (group home), [place]on Asters. [facility], [phone]   10:17 AM - CSW left a voicemail Onalee Hua was supposed to visit patient at 10 AM)  2:01 PM - Onalee Hua said that he was busy today, but will visit patient tomorrow morning, between 8 AM - 9 AM.  If patient is doing well, he will take patient to the group home.  He requested to speak with the doctor.   Collateral contact - [name] - legal guardian - [phone]   Legal guardian said that she was unaware that the group home didn't visit patient on Friday, 4/4.  Onalee Hua gave her an update on how patient was doing, so she thought he had visited him.        Kathyann Spaugh, LCSWA 10/19/2023

## 2023-10-19 NOTE — Progress Notes (Signed)
 Collateral contact [facility]) [phone]  Patient is still on the waiting list.   Read Drivers, LCSWA 10/19/2023

## 2023-10-19 NOTE — Plan of Care (Signed)
Problem: Education: Goal: Emotional status will improve Outcome: Progressing Goal: Mental status will improve Outcome: Progressing   Problem: Activity: Goal: Interest or engagement in activities will improve Outcome: Progressing Goal: Sleeping patterns will improve Outcome: Progressing   Problem: Health Behavior/Discharge Planning: Goal: Compliance with treatment plan for underlying cause of condition will improve Outcome: Progressing   Problem: Safety: Goal: Periods of time without injury will increase Outcome: Progressing

## 2023-10-20 MED ORDER — TRAZODONE HCL 150 MG PO TABS
150.0000 mg | ORAL_TABLET | Freq: Every evening | ORAL | Status: DC | PRN
  Administered 2023-10-20: 150 mg via ORAL
  Filled 2023-10-20: qty 1

## 2023-10-20 NOTE — Care Management Important Message (Signed)
 Medicare IM printed and given to social work to give to the patient on the unit.

## 2023-10-20 NOTE — Progress Notes (Signed)
 [facility] MD Progress Note  10/20/2023 11:58 AM Anthony Knox  MRN:  [id number]   Reason for Admission:  Anthony Knox is a 63 y.o., male with a past psychiatric history significant for schizoaffective disorder, bipolar type admitted involuntarily to the [facility] from [facility] for evaluation and management of agitation in his group home and psychosis. The patient is currently on Hospital Day 17.   Chart Review from last 24 hours:  The patient's chart was reviewed and nursing notes were reviewed. The patient's case was discussed in multidisciplinary team meeting.  Patient has been adherent to medications and has not used any as needed medications.  Per nursing patient only slept 1 hour.   Information Obtained Today During Patient Interview: Patient reports that he is upset that he is going back to his old group home.  On further discussion patient is contradicting previous information.  Patient reports does not have any side effects medication including injection last night.  Patient reports that he is eating okay but is continuing to not eat (for delusional reasons) at times.  He has been sleeping well.  Meeting with group home coordinator Barbie Banner: Onalee Hua was present in the room with patient, myself, and Jolanta Child psychotherapist.  Onalee Hua assess patient and reported that patient was at his baseline.  On further discussion date is May concern was if patient would be adherent to medications and we discussed that patient is on a long-acting injectable and that the patient has been agreeable to receiving these injections although he has required a lot of redirection to receive them.  Discussed with patient is stable for discharge whenever group home is willing to receive him.  We discussed collective plan of patient discharging to group home tomorrow morning.  Sleep  Sleep: Fair sleep reported  Principal Problem: Schizophrenia (HCC) Diagnosis: Principal Problem:   Schizophrenia  (HCC)    Past Psychiatric History:  Previous psychiatric diagnoses: schizoaffective d/o Prior psychiatric treatment: collateral is unsure Psychiatric medication compliance history: poor   Current psychiatric treatment: collateral is unsure Current psychiatrist: collateral is unsure Current therapist: collateral is unsure   Previous hospitalizations yes, previously in [facility] for 8 months; per chart review was in [facility] in 2019 History of suicide attempts: denies History of self harm: denies  Past Medical History:  Past Medical History:  Diagnosis Date   Schizophrenia (HCC)    History reviewed. No pertinent surgical history. Family History: History reviewed. No pertinent family history. Family Psychiatric  History: brother committed suicide, other brother has ADHD and bipolar disorder   Social History: Housing: lives in [place] group home Education: finished 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 Weapons: denies  Current Medications: Current Facility-Administered Medications  Medication Dose Route Frequency Provider Last Rate Last Admin   acetaminophen (TYLENOL) tablet 650 mg  650 mg Oral Q6H PRN Meryl Dare, MD       alum & mag hydroxide-simeth (MAALOX/MYLANTA) 200-200-20 MG/5ML suspension 30 mL  30 mL Oral Q4H PRN Ophelia Shoulder E, NP   30 mL at 10/15/23 0400   haloperidol (HALDOL) tablet 5 mg  5 mg Oral TID PRN Chales Abrahams, NP       And   diphenhydrAMINE (BENADRYL) capsule 50 mg  50 mg Oral TID PRN Ophelia Shoulder E, NP       haloperidol lactate (HALDOL) injection 5 mg  5 mg Intramuscular TID PRN Chales Abrahams, NP       And  diphenhydrAMINE (BENADRYL) injection 50 mg  50 mg Intramuscular TID PRN Chales Abrahams, NP       And   LORazepam (ATIVAN) injection 2 mg  2 mg Intramuscular TID PRN Ophelia Shoulder E, NP       haloperidol lactate (HALDOL) injection 10 mg  10 mg Intramuscular TID PRN Chales Abrahams, NP       And   diphenhydrAMINE  (BENADRYL) injection 50 mg  50 mg Intramuscular TID PRN Chales Abrahams, NP       And   LORazepam (ATIVAN) injection 2 mg  2 mg Intramuscular TID PRN Ophelia Shoulder E, NP       docusate sodium (COLACE) capsule 100 mg  100 mg Oral Daily Peterson Ao, MD   100 mg at 10/20/23 0816   gabapentin (NEURONTIN) capsule 100 mg  100 mg Oral TID Peterson Ao, MD   100 mg at 10/20/23 1308   hydrOXYzine (ATARAX) tablet 25 mg  25 mg Oral TID PRN Kizzie Ide B, MD   25 mg at 10/19/23 2047   ibuprofen (ADVIL) tablet 400 mg  400 mg Oral Q4H PRN Peterson Ao, MD       magnesium hydroxide (MILK OF MAGNESIA) suspension 30 mL  30 mL Oral Daily PRN Ophelia Shoulder E, NP       rivastigmine (EXELON) capsule 1.5 mg  1.5 mg Oral BID Peterson Ao, MD   1.5 mg at 10/20/23 0816   senna (SENOKOT) tablet 8.6 mg  1 tablet Oral Daily Peterson Ao, MD   8.6 mg at 10/20/23 0816   traZODone (DESYREL) tablet 100 mg  100 mg Oral QHS PRN Peterson Ao, MD   100 mg at 10/19/23 2047    Lab Results: No results found for this or any previous visit (from the past 48 hours).  Blood Alcohol level:  Lab Results  Component Value Date   ETH <10 09/29/2023   ETH <10 11/28/2021    Metabolic Disorder Labs: Lab Results  Component Value Date   HGBA1C 5.6 10/05/2023   MPG 114.02 10/05/2023   MPG 123 12/07/2020   Lab Results  Component Value Date   PROLACTIN 34.1 (H) 05/08/2018   Lab Results  Component Value Date   CHOL 162 10/05/2023   TRIG 54 10/05/2023   HDL 47 10/05/2023   CHOLHDL 3.4 10/05/2023   VLDL 11 10/05/2023   LDLCALC 104 (H) 10/05/2023   LDLCALC 127 (H) 12/07/2020    Physical Findings: AIMS: Facial and Oral Movements Muscles of Facial Expression: None Lips and Perioral Area: None Jaw: None Tongue: None,Extremity Movements Upper (arms, wrists, hands, fingers): None Lower (legs, knees, ankles, toes): None, Trunk Movements Neck, shoulders, hips: None, Global Judgements Severity of abnormal  movements overall : None Incapacitation due to abnormal movements: None Patient's awareness of abnormal movements: No Awareness, Dental Status Current problems with teeth and/or dentures?: No Does patient usually wear dentures?: No Edentia?: No  CIWA:    COWS:     Musculoskeletal: Strength & Muscle Tone: within normal limits Gait & Station: normal Patient leans: N/A  Psychiatric Specialty Exam:  General Appearance: Dressed in casual clothes, slightly disheveled and unkempt  Behavior: Cooperative in general but occasionally seems guarded  Psychomotor Activity: No psychomotor agitation but mild retardation noted  Eye Contact: Fair Speech: Decreased amount and speed but normal tone and volume   Mood: Vague disorganized response to mood question Affect: Restricted  Thought Process: Nonlinear and disorganized with occasional linear concrete responses Descriptions of  Associations: Disorganized, sometimes concrete Thought Content: Hallucinations: Denies AH, VH, does not appear responding to stimuli Delusions: No paranoia but bizarre delusions noted related to disorganized thought process Suicidal Thoughts: Denies passive or active SI, intention, plan  Homicidal Thoughts: Denied eyes HI, intention, plan   Alertness/Orientation: Alert but disoriented  Insight: Poor Judgment: Poor  Memory: Poor  Executive Functions  Concentration: Limited, easily distracted Attention Span: Poor Recall: Poor Fund of Knowledge: Poor   Assets  Assets: Housing    Physical Exam: Physical Exam Vitals and nursing note reviewed.  HENT:     Head: Normocephalic and atraumatic.  Pulmonary:     Effort: Pulmonary effort is normal.  Neurological:     General: No focal deficit present.     Mental Status: He is alert.  Psychiatric:     Comments: No obvious EPS.   Review of Systems  Constitutional:  Negative for fever.  Cardiovascular:  Negative for chest pain and palpitations.   Gastrointestinal:  Negative for constipation, diarrhea, nausea and vomiting.  Neurological:  Negative for dizziness, weakness and headaches.  All other systems reviewed and are negative.  Blood pressure 106/78, pulse 72, temperature 98.3 F (36.8 C), temperature source Oral, resp. rate 16, height 5\' 6"  (1.676 m), weight 66.2 kg, SpO2 99%. Body mass index is 23.57 kg/m.   Treatment Plan Summary: Daily contact with patient to assess and evaluate symptoms and progress in treatment and Medication management  ASSESSMENT:  Diagnoses / Active Problems: Principal Problem: Schizophrenia (HCC) Diagnosis: Principal Problem:   Schizophrenia (HCC)  Group home cell patient today reported he is at his baseline and they are agreeable to having him return.  Discussed group home coming to pick him up tomorrow morning.  Patient has received both loading doses of Invega and is stable to continue taking monthly injection without any oral bridge.  Continues to have no side effects medications.  Of note nursing reported patient only slept 1 hour and this appears to be abnormal the patient and does not appear to be any acute concerns prior to discharge.  PLAN: Safety and Monitoring:  -- Involuntary admission to inpatient psychiatric unit for safety, stabilization and treatment  -- Daily contact with patient to assess and evaluate symptoms and progress in treatment  -- Patient's case to be discussed in multi-disciplinary team meeting  -- Observation Level : q15 minute checks  -- Vital signs:  q12 hours  -- Precautions: suicide, elopement, and assault  2. Medications:  Risperidone taper complete received Invega Sustenna 234 mg on 4/3 and received second loading dose 156 milligram on 4/8  Continue rivastigmine 1.5 mg twice daily for memory problem  Continue gabapentin 100 mg 3 times daily  Continue Colace 100 mg daily for constipation and Senokot 8.6 mg daily for constipation  Continue agitation  protocol  Continue Atarax 25 mg 3 times daily as needed for anxiety  Increase trazodone 150 mg at bedtime as needed for sleep --  The risks/benefits/side-effects/alternatives to this medication were discussed in detail with the patient and time was given for questions. The patient consents to medication trial.    -- Metabolic profile and EKG monitoring obtained while on an atypical antipsychotic (BMI: Lipid Panel: HbgA1c: QTc:)      3. Pertinent labs: Labwork reviewed      Lab ordered: None  4. Group and Therapy: -- Encouraged patient to participate in unit milieu and in scheduled group therapies      -- Short Term Goals: Ability to identify changes  in lifestyle to reduce recurrence of condition will improve and Ability to verbalize feelings will improve  -- Long Term Goals: Improvement in symptoms so as ready for discharge  6. Discharge Planning:   -- Social work and case management to assist with discharge planning and identification of hospital follow-up needs prior to discharge  -- Estimated LOS: Tomorrow 4/9 to group home  -- Discharge Concerns: Need to establish a safety plan; Medication compliance and effectiveness  -- Discharge Goals: Return home with outpatient referrals for mental health follow-up including medication management/psychotherapy   Meryl Dare, MD PGY-1 Psychiatry Resident 10/20/2023, 12:55 PM

## 2023-10-20 NOTE — Group Note (Signed)
 Date:  10/20/2023 Time:  8:54 PM  Group Topic/Focus:  Wrap-Up Group:   The focus of this group is to help patients review their daily goal of treatment and discuss progress on daily workbooks.    Participation Level:  Active  Participation Quality:  Appropriate  Affect:  Appropriate  Cognitive:  Appropriate  Insight: Appropriate  Engagement in Group:  Engaged  Modes of Intervention:  Education and Exploration  Additional Comments:  Patient attended and participated in group tonight.  He reports that he learn to be responsible and about his medication  Scot Dock 10/20/2023, 8:54 PM

## 2023-10-20 NOTE — Progress Notes (Signed)
Pt refused 4pm vitals.

## 2023-10-20 NOTE — Progress Notes (Signed)
   10/20/23 2315  Psych Admission Type (Psych Patients Only)  Admission Status Involuntary  Psychosocial Assessment  Patient Complaints Suspiciousness  Eye Contact Fair  Facial Expression Flat  Affect Labile  Speech Tangential  Interaction Guarded  Motor Activity Slow  Appearance/Hygiene Disheveled  Behavior Characteristics Guarded  Mood Suspicious;Labile  Thought Process  Coherency Loose associations;Tangential  Content Blaming others  Delusions None reported or observed  Perception Derealization  Hallucination None reported or observed  Judgment Impaired  Confusion Mild  Danger to Self  Current suicidal ideation? Denies  Danger to Others  Danger to Others None reported or observed

## 2023-10-20 NOTE — Progress Notes (Addendum)
 Collateral contact - Cliffton Asters. Humphrey from [facility] (group home) visited patient in person.   Mr. Andee Poles assessed patient and said that he is at baseline.  Patient will be accepted back at the group home.  Mr. Andee Poles will pick him up tomorrow, Wednesday, 10/21/2023 before 12 PM (possibly between 10 AM - 11 AM).  Ms. Andee Poles said that a psychiatrist visits patients at the group home twice per month, around the 1st of the month and 15th of the month.   Reace Breshears, LCSWA 10/20/2023

## 2023-10-20 NOTE — Plan of Care (Signed)
 Signed        Problem: Education: Goal: Emotional status will improve Outcome: Progressing Goal: Mental status will improve Outcome: Progressing   Problem: Activity: Goal: Interest or engagement in activities will improve Outcome: Progressing Goal: Sleeping patterns will improve Outcome: Progressing   Problem: Health Behavior/Discharge Planning: Goal: Compliance with treatment plan for underlying cause of condition will improve Outcome: Progressing   Problem: Safety: Goal: Periods of time without injury will increase Outcome: Progressing

## 2023-10-20 NOTE — [facility] Group Notes (Signed)
 Adult Psychoeducational Group Note  Date:  10/20/2023 Time:  3:51 PM  Group Topic/Focus:  Goals Group:   The focus of this group is to help patients establish daily goals to achieve during treatment and discuss how the patient can incorporate goal setting into their daily lives to aide in recovery.  Participation Level:  Active  Participation Quality:  Attentive  Affect:  Appropriate  Cognitive:  Alert  Insight: Appropriate  Engagement in Group:  Engaged  Modes of Intervention:  Orientation  Additional Comments:  Pt goal for today is to go to groups  Dellia Nims 10/20/2023, 3:51 PM

## 2023-10-20 NOTE — BH IP Treatment Plan (Signed)
 Interdisciplinary Treatment and Diagnostic Plan Update  10/20/2023 Time of Session: 1338 - UPDATE Anthony Knox MRN: [id number]  Principal Diagnosis: Schizophrenia [facility])  Secondary Diagnoses: Principal Problem:   Schizophrenia (HCC)   Current Medications:  Current Facility-Administered Medications  Medication Dose Route Frequency Provider Last Rate Last Admin   acetaminophen (TYLENOL) tablet 650 mg  650 mg Oral Q6H PRN Meryl Dare, MD       alum & mag hydroxide-simeth (MAALOX/MYLANTA) 200-200-20 MG/5ML suspension 30 mL  30 mL Oral Q4H PRN Ophelia Shoulder E, NP   30 mL at 10/15/23 0400   haloperidol (HALDOL) tablet 5 mg  5 mg Oral TID PRN Chales Abrahams, NP       And   diphenhydrAMINE (BENADRYL) capsule 50 mg  50 mg Oral TID PRN Chales Abrahams, NP       haloperidol lactate (HALDOL) injection 5 mg  5 mg Intramuscular TID PRN Chales Abrahams, NP       And   diphenhydrAMINE (BENADRYL) injection 50 mg  50 mg Intramuscular TID PRN Chales Abrahams, NP       And   LORazepam (ATIVAN) injection 2 mg  2 mg Intramuscular TID PRN Ophelia Shoulder E, NP       haloperidol lactate (HALDOL) injection 10 mg  10 mg Intramuscular TID PRN Chales Abrahams, NP       And   diphenhydrAMINE (BENADRYL) injection 50 mg  50 mg Intramuscular TID PRN Chales Abrahams, NP       And   LORazepam (ATIVAN) injection 2 mg  2 mg Intramuscular TID PRN Chales Abrahams, NP       docusate sodium (COLACE) capsule 100 mg  100 mg Oral Daily Peterson Ao, MD   100 mg at 10/20/23 0816   gabapentin (NEURONTIN) capsule 100 mg  100 mg Oral TID Peterson Ao, MD   100 mg at 10/20/23 0816   hydrOXYzine (ATARAX) tablet 25 mg  25 mg Oral TID PRN Kizzie Ide B, MD   25 mg at 10/19/23 2047   ibuprofen (ADVIL) tablet 400 mg  400 mg Oral Q4H PRN Peterson Ao, MD       magnesium hydroxide (MILK OF MAGNESIA) suspension 30 mL  30 mL Oral Daily PRN Ophelia Shoulder E, NP       rivastigmine (EXELON) capsule 1.5 mg  1.5 mg Oral BID Peterson Ao, MD   1.5 mg at 10/20/23 8657   senna (SENOKOT) tablet 8.6 mg  1 tablet Oral Daily Peterson Ao, MD   8.6 mg at 10/20/23 0816   traZODone (DESYREL) tablet 100 mg  100 mg Oral QHS PRN Peterson Ao, MD   100 mg at 10/19/23 2047   PTA Medications: Medications Prior to Admission  Medication Sig Dispense Refill Last Dose/Taking   benztropine (COGENTIN) 0.5 MG tablet Take 1 tablet (0.5 mg total) by mouth 2 (two) times daily in the am and at bedtime.. For prevention of drug induced tremors (Patient not taking: Reported on 11/28/2021) 60 tablet 0    hydrOXYzine (ATARAX/VISTARIL) 25 MG tablet Take 1 tablet (25 mg total) by mouth every 6 (six) hours as needed for anxiety (Sleep). (Patient not taking: Reported on 11/28/2021) 75 tablet 0    levothyroxine (SYNTHROID) 50 MCG tablet Take 1 tablet (50 mcg total) by mouth daily at 6 (six) AM. For thyroid hormone replacement (Patient not taking: Reported on 11/28/2021) 15 tablet 0    paliperidone (INVEGA SUSTENNA) 156 MG/ML SUSY injection Inject  1 mL (156 mg total) into the muscle every 28 (twenty-eight) days. Next dose due 02/28/2021. (Patient not taking: Reported on 11/28/2021) 1.2 mL 0    polyethylene glycol (MIRALAX / GLYCOLAX) 17 g packet Take 17 g by mouth daily. (Patient not taking: Reported on 11/28/2021) 14 each 0    prazosin (MINIPRESS) 2 MG capsule Take 2 mg by mouth at bedtime. (Patient not taking: Reported on 09/30/2023)      ramelteon (ROZEREM) 8 MG tablet Take 1 tablet (8 mg total) by mouth at bedtime. For sleep (Patient not taking: Reported on 11/28/2021) 15 tablet 0    risperiDONE (RISPERDAL) 2 MG tablet Take 1 tablet (2 mg total) by mouth at bedtime. For mood control (Patient not taking: Reported on 11/28/2021) 30 tablet 0    senna (SENOKOT) 8.6 MG TABS tablet Take 1 tablet (8.6 mg total) by mouth daily. (May buy from over the counter): For constipation (Patient not taking: Reported on 11/28/2021) 1 tablet 0    valproic acid (DEPAKENE) 250 MG/5ML  solution Take by mouth See admin instructions. Give 15 ml by mouth twice daily (Patient not taking: Reported on 09/30/2023)       Patient Stressors:    Patient Strengths:    Treatment Modalities: Medication Management, Group therapy, Case management,  1 to 1 session with clinician, Psychoeducation, Recreational therapy.   Physician Treatment Plan for Primary Diagnosis: Schizophrenia (HCC) Long Term Goal(s): Improvement in symptoms so as ready for discharge   Short Term Goals: Ability to identify changes in lifestyle to reduce recurrence of condition will improve Ability to verbalize feelings will improve  Medication Management: Evaluate patient's response, side effects, and tolerance of medication regimen.  Therapeutic Interventions: 1 to 1 sessions, Unit Group sessions and Medication administration.  Evaluation of Outcomes: Progressing  Physician Treatment Plan for Secondary Diagnosis: Principal Problem:   Schizophrenia (HCC)  Long Term Goal(s): Improvement in symptoms so as ready for discharge   Short Term Goals: Ability to identify changes in lifestyle to reduce recurrence of condition will improve Ability to verbalize feelings will improve     Medication Management: Evaluate patient's response, side effects, and tolerance of medication regimen.  Therapeutic Interventions: 1 to 1 sessions, Unit Group sessions and Medication administration.  Evaluation of Outcomes: Progressing   RN Treatment Plan for Primary Diagnosis: Schizophrenia (HCC) Long Term Goal(s): Knowledge of disease and therapeutic regimen to maintain health will improve  Short Term Goals: Ability to verbalize frustration and anger appropriately will improve, Ability to verbalize feelings will improve, Ability to disclose and discuss suicidal ideas, and Compliance with prescribed medications will improve  Medication Management: RN will administer medications as ordered by provider, will assess and evaluate  patient's response and provide education to patient for prescribed medication. RN will report any adverse and/or side effects to prescribing provider.  Therapeutic Interventions: 1 on 1 counseling sessions, Psychoeducation, Medication administration, Evaluate responses to treatment, Monitor vital signs and CBGs as ordered, Perform/monitor CIWA, COWS, AIMS and Fall Risk screenings as ordered, Perform wound care treatments as ordered.  Evaluation of Outcomes: Progressing   LCSW Treatment Plan for Primary Diagnosis: Schizophrenia (HCC) Long Term Goal(s): Safe transition to appropriate next level of care at discharge, Engage patient in therapeutic group addressing interpersonal concerns.  Short Term Goals: Engage patient in aftercare planning with referrals and resources, Increase ability to appropriately verbalize feelings, Increase emotional regulation, and Increase skills for wellness and recovery  Therapeutic Interventions: Assess for all discharge needs, 1 to 1 time with  Social worker, Explore available resources and support systems, Assess for adequacy in community support network, Educate family and significant other(s) on suicide prevention, Complete Psychosocial Assessment, Interpersonal group therapy.  Evaluation of Outcomes: Progressing   Progress in Treatment: Attending groups: Yes. Participating in groups: Yes. Taking medication as prescribed: Yes. Toleration medication: Yes. Family/Significant other contact made: No, will contact:  declined consents Patient understands diagnosis: No. Discussing patient identified problems/goals with staff: No. Medical problems stabilized or resolved: Yes. Denies suicidal/homicidal ideation: Yes. Issues/concerns per patient self-inventory: No.   New problem(s) identified: No, Describe:  none reported   New Short Term/Long Term Goal(s): medication stabilization, elimination of SI thoughts, development of comprehensive mental wellness plan.       Patient Goals:  Pt unable to participate in treatment team, experiencing psychosis   Discharge Plan or Barriers: Patient recently admitted. CSW will continue to follow and assess for appropriate referrals and possible discharge planning.      Reason for Continuation of Hospitalization: Delusions  Hallucinations Medication stabilization   Estimated Length of Stay: 2-4 days   Last 3 [place] Suicide Severity Risk Score: Flowsheet Row Ad[facility] (Current) [facility] from 09/29/2023 in [facility] ED from 01/31/2021 in [facility]  C-SSRS RISK CATEGORY Low Risk No Risk Error: Q6 is Yes, you must answer 7       Last PHQ 2/9 Scores:    01/31/2021   11:44 AM  Depression screen PHQ 2/9  Decreased Interest 3  Down, Depressed, Hopeless 3  PHQ - 2 Score 6  Altered sleeping 3  Tired, decreased energy 3  Change in appetite 3  Feeling bad or failure about yourself  3  Trouble concentrating 3  Moving slowly or fidgety/restless 3  Suicidal thoughts 3  PHQ-9 Score 27  Difficult doing work/chores Somewhat difficult    Scribe for Treatment Team: Jacinta Shoe, LCSW 10/20/2023 10:18 AM

## 2023-10-21 MED ORDER — PALIPERIDONE PALMITATE ER 156 MG/ML IM SUSY: 156.0000 mg | PREFILLED_SYRINGE | INTRAMUSCULAR | 0 refills | Status: AC

## 2023-10-21 MED ORDER — RIVASTIGMINE TARTRATE 1.5 MG PO CAPS
1.5000 mg | ORAL_CAPSULE | Freq: Two times a day (BID) | ORAL | 0 refills | Status: AC
Start: 2023-10-21 — End: ?

## 2023-10-21 MED ORDER — HYDROXYZINE HCL 25 MG PO TABS: 25.0000 mg | ORAL_TABLET | Freq: Four times a day (QID) | ORAL | 0 refills | Status: AC | PRN

## 2023-10-21 MED ORDER — GABAPENTIN 100 MG PO CAPS: 100.0000 mg | ORAL_CAPSULE | Freq: Three times a day (TID) | ORAL | 0 refills | Status: AC

## 2023-10-21 MED ORDER — TRAZODONE HCL 150 MG PO TABS: 150.0000 mg | ORAL_TABLET | Freq: Every evening | ORAL | 0 refills | Status: AC | PRN

## 2023-10-21 MED ORDER — POLYETHYLENE GLYCOL 3350 17 G PO PACK: 17.0000 g | PACK | Freq: Every day | ORAL | 0 refills | Status: AC | PRN

## 2023-10-21 MED ORDER — DOCUSATE SODIUM 100 MG PO CAPS
100.0000 mg | ORAL_CAPSULE | Freq: Two times a day (BID) | ORAL | 0 refills | Status: AC
Start: 2023-10-21 — End: ?

## 2023-10-21 NOTE — Discharge Summary (Signed)
 Physician Discharge Summary Note  Patient:  Anthony Knox is an 63 y.o., male MRN:  [id number] DOB:  [birth year] Patient phone:  [phone] (home)  Patient address:   [address] [place] [address],  Total Time spent with patient: 45 minutes  Date of Admission:  10/03/2023 Date of Discharge: 10/21/2023  Reason for Admission: Psychosis, agitation  History of Present Illness:  LADARRIAN Knox is a 63 y.o., male with a past psychiatric history significant for schizoaffective disorder, bipolar type who presents to the [facility] from [facility] for evaluation and management of agitation in his group home and psychosis.    Initial assessment on 3/23, patient was evaluated on the inpatient unit. Patient is able to state his full name but otherwise, he did not appropriately answer his age, the current date, place, and situation. For his age, he answers "non relevant-how many teeth you have". When I asked him where he lives, he states "I forgot- by myself with others". When I asked if he has family around, he states "I dont know-what family?". He states somebody in the RX owns his life. When I asked him what was occurring in the group home, he states "stuff was coming out of the TV, weird lights, celestials". He states "I am against my family. They are out to prove that they are better than me. The good people are gone and the ones left are bad". When I asked him who are the good people, he states "the ones that died young". When I asked who, he says "[name] the elf and [name]". When I asked if he was taking medications prior to admission, he states that he was on a tranquilizer to calm his nerves. He states "An injection for subtraction. One side is for and one side is against". He deneis SI and HI. When asked about AVH, he looks around and says "above is the moon and below is the grass". I discussed that we will be putting him on medication to help with his thoughts and  he was agreeable.   Psychiatric medications prior to admission: unclear   Collateral information obtained from Delrae Sawyers, patient's brother (phone number [phone] ) He states that he does not know of what brought the patient in recently. He states that he last saw his brother many months ago and whenever he would visit, patient would be upset because patient thinks that the brother is stealing from him. He believes the patient was diagnosed with schizophrenia 25 years ago and their mother was living with him all his life in which she would help him stay compliant with his medications. His mother died in 2006/11/16 and he feels that is when the patient really declined. Patient was living alone until 2 years ago when he went to a group home. He went to [facility] after some time and was there for 8 months. He believes the patient was on Invega which was helpful. Previously, patient would have psychotic episodes of feeling paranoia, agitated, and impulsive but he does not have a good timeline of how patient acts recently regarding "baseline". He's tried to call the group home that he was staying at but they have not been answering. He denies patient having a medical history. He reports another brother committing suicide in 11/15/97. Patient was previously using tobacco snuff when he was living alone. He denies patient using marijuana regularly in the past. Patient has a history of being a sex offender and has in jail  for 2 years. He denies patient having access to weapons. He denies patient attempting suicide in the past.   Principal Problem: Schizophrenia [facility]) Discharge Diagnoses: Principal Problem:   Schizophrenia (HCC)   Past Psychiatric History:  Previous psychiatric diagnoses: schizoaffective d/o Prior psychiatric treatment: collateral is unsure Psychiatric medication compliance history: poor   Current psychiatric treatment: collateral is unsure Current psychiatrist: collateral is unsure Current therapist:  collateral is unsure   Previous hospitalizations yes, previously in [facility] for 8 months; per chart review was in [facility] in 2019 History of suicide attempts: denies History of self harm: denies   Past Medical History:  Past Medical History:  Diagnosis Date   Schizophrenia (HCC)    History reviewed. No pertinent surgical history. Family History: History reviewed. No pertinent family history. Family Psychiatric  History: brother committed suicide, other brother has ADHD and bipolar disorder  Social History:  Social History   Substance and Sexual Activity  Alcohol Use Not Currently     Social History   Substance and Sexual Activity  Drug Use Never    Social History   Socioeconomic History   Marital status: Single    Spouse name: Not on file   Number of children: Not on file   Years of education: Not on file   Highest education level: Not on file  Occupational History   Not on file  Tobacco Use   Smoking status: Never   Smokeless tobacco: Never  Vaping Use   Vaping status: Never Used  Substance and Sexual Activity   Alcohol use: Not Currently   Drug use: Never   Sexual activity: Not Currently  Other Topics Concern   Not on file  Social History Narrative   Not on file   Social Drivers of Health   Financial Resource Strain: Not on file  Food Insecurity: Not on file  Transportation Needs: Not on file  Physical Activity: Not on file  Stress: Not on file  Social Connections: Not on file    Hospital course: Patient presented after group home reported that patient was becoming agitated and on admission was found to be sprayed psychosis.  Patient was started on Risperdal which has been effective in the past as well as Depakene.  Risperdal was titrated up during hospitalization with some improvement in patient's psychosis but even with treatment patient had some baseline delusions and disorganized thoughts.  Of note patient also has some degree neurocognitive disorder as  evident by his poor short-term memory.  Prior to discharge the group home visit patient and reported he was at baseline and was willing to have patient return to their facility.  By discharge patient been transition to [place] long-acting injectable and was discontinued off the Depakene due to lack of efficacy.  During the patient's hospitalization, patient had extensive initial psychiatric evaluation, and follow-up psychiatric evaluations every day.  Psychiatric diagnoses provided upon initial assessment:  Schizoaffective disorder, bipolar type  Patient's psychiatric medications were adjusted on admission:  -Start Risperdal 1 mg q12H for psychosis -Start Depakene 250 mg BID for mood stabilization  During the hospitalization, other adjustments were made to the patient's psychiatric medication regimen:  Depakene was discontinued Risperdal was titrated up and patient was transitioned to [place] Started on rivastigmine for neurocognitive disorder Patient received loading dose of Invega 456 mg and 156 mg (4/7) and will receive next maintenance dose on 11/16/2023  Patient's care was discussed during the interdisciplinary team meeting every day during the hospitalization.  The patient is not having  side effects to prescribed psychiatric medication.  Gradually, patient started adjusting to milieu. The patient was evaluated each day by a clinical provider to ascertain response to treatment. Improvement was noted by the patient's report of decreasing symptoms, improved sleep and appetite, affect, medication tolerance, behavior, and participation in unit programming.  Patient was asked each day to complete a self inventory noting mood, mental status, pain, new symptoms, anxiety and concerns.   Symptoms were reported as significantly decreased or resolved completely by discharge.  The patient reports that their mood is stable.  The patient denied having suicidal thoughts for more than 48 hours prior to  discharge.  Patient denies having homicidal thoughts.  Patient denies having auditory hallucinations.  Patient denies any visual hallucinations or other symptoms of psychosis. Pt does have some baseline delusions though that group home reports are baseline.  The patient was motivated to continue taking medication with a goal of continued improvement in mental health.   The patient reports their target psychiatric symptoms of psychosis and agitation responded well to the psychiatric medications, and the patient reports overall benefit other psychiatric hospitalization. Supportive psychotherapy was provided to the patient. The patient also participated in regular group therapy while hospitalized. Coping skills, problem solving as well as relaxation therapies were also part of the unit programming.  Labs were reviewed with the patient, and abnormal results were discussed with the patient.  The patient is able to verbalize their individual safety plan to this provider.  # It is recommended to the patient to continue psychiatric medications as prescribed, after discharge from the hospital.    # It is recommended to the patient to follow up with your outpatient psychiatric provider and PCP.  # It was discussed with the patient, the impact of alcohol, drugs, tobacco have been there overall psychiatric and medical wellbeing, and total abstinence from substance use was recommended the patient.ed.  # Prescriptions provided or sent directly to preferred pharmacy at discharge. Patient agreeable to plan. Given opportunity to ask questions. Appears to feel comfortable with discharge.    # In the event of worsening symptoms, the patient is instructed to call the crisis hotline, 911 and or go to the nearest ED for appropriate evaluation and treatment of symptoms. To follow-up with primary care provider for other medical issues, concerns and or health care needs  # Patient was discharged to group home with a plan  to follow up as noted below.    On day of discharge patient had no significant concerns.  Patient denied any side effects to medications. Pt denies extrapyramidal symptoms including dystonia (sudden spastic contractions of muscle groups), parkinsonism (bradykinesia, tremors, rigidity), and akathisia (severe restlessness).  Patient reports that he was eating fine and sleeping okay.  Group home would pick up the patient from 10 to 11 AM.  Patient denies SI, HI, AVH.  Patient continues to have some baseline delusions and disorganized thoughts but per group home is at his baseline.    Physical Findings: AIMS: Facial and Oral Movements Muscles of Facial Expression: None Lips and Perioral Area: None Jaw: None Tongue: None,Extremity Movements Upper (arms, wrists, hands, fingers): None Lower (legs, knees, ankles, toes): None, Trunk Movements Neck, shoulders, hips: None, Global Judgements Severity of abnormal movements overall : None Incapacitation due to abnormal movements: None Patient's awareness of abnormal movements: No Awareness, Dental Status Current problems with teeth and/or dentures?: No Does patient usually wear dentures?: No Edentia?: No   Musculoskeletal: Strength & Muscle Tone: within normal  limits Gait & Station: normal Patient leans: N/A   Psychiatric Specialty Exam:  Presentation  General Appearance:  Fairly Groomed  Eye Contact: Good  Speech: Normal Rate  Speech Volume: Normal  Handedness: Right   Mood and Affect  Mood: Euthymic  Affect: Appropriate; Congruent; Full Range   Thought Process  Thought Processes: Disorganized; Linear (Baseline per group home)  Descriptions of Associations:Loose  Orientation:Full (Time, Place and Person)  Thought Content:Delusions (Baseline per group home)  History of Schizophrenia/Schizoaffective disorder:Yes  Duration of Psychotic Symptoms:Greater than six months  Hallucinations:Hallucinations: None (Not  responding to internal stimuli.)  Ideas of Reference:Delusions (Baseline per group home.)  Suicidal Thoughts:Suicidal Thoughts: No  Homicidal Thoughts:Homicidal Thoughts: No   Sensorium  Memory: Recent Poor; Immediate Poor; Remote Fair  Judgment: Poor  Insight: Poor   Executive Functions  Concentration: Fair  Attention Span: Fair  Recall: Poor  Fund of Knowledge: Good  Language: Good   Psychomotor Activity  Psychomotor Activity:Psychomotor Activity: Normal (No EPS.)   Assets  Assets: Housing   Sleep  Sleep:Sleep: Good Number of Hours of Sleep: 8.25    Physical Exam: Physical Exam Vitals and nursing note reviewed.  HENT:     Head: Normocephalic and atraumatic.  Pulmonary:     Effort: Pulmonary effort is normal.  Neurological:     General: No focal deficit present.     Mental Status: He is alert.  Psychiatric:     Comments: No obvious EPS.    Review of Systems  Constitutional:  Negative for fever.  Cardiovascular:  Negative for chest pain and palpitations.  Gastrointestinal:  Negative for constipation, diarrhea, nausea and vomiting.  Neurological:  Negative for dizziness, weakness and headaches.  Psychiatric/Behavioral:         Pt denies extrapyramidal symptoms including dystonia (sudden spastic contractions of muscle groups), parkinsonism (bradykinesia, tremors, rigidity), and akathisia (severe restlessness).    Blood pressure 127/81, pulse 61, temperature 97.8 F (36.6 C), temperature source Oral, resp. rate 16, height 5\' 6"  (1.676 m), weight 66.2 kg, SpO2 100%. Body mass index is 23.57 kg/m.   Social History   Tobacco Use  Smoking Status Never  Smokeless Tobacco Never   Tobacco Cessation:  N/A, patient does not currently use tobacco products   Blood Alcohol level:  Lab Results  Component Value Date   ETH <10 09/29/2023   ETH <10 11/28/2021    Metabolic Disorder Labs:  Lab Results  Component Value Date   HGBA1C 5.6  10/05/2023   MPG 114.02 10/05/2023   MPG 123 12/07/2020   Lab Results  Component Value Date   PROLACTIN 34.1 (H) 05/08/2018   Lab Results  Component Value Date   CHOL 162 10/05/2023   TRIG 54 10/05/2023   HDL 47 10/05/2023   CHOLHDL 3.4 10/05/2023   VLDL 11 10/05/2023   LDLCALC 104 (H) 10/05/2023   LDLCALC 127 (H) 12/07/2020    See Psychiatric Specialty Exam and Suicide Risk Assessment completed by Attending Physician prior to discharge.  Discharge destination:  Other:  Group home  Is patient on multiple antipsychotic therapies at discharge:  No   Has Patient had three or more failed trials of antipsychotic monotherapy by history:  No  Recommended Plan for Multiple Antipsychotic Therapies: NA   Allergies as of 10/21/2023       Reactions   Geodon [ziprasidone Hcl] Other (See Comments)   Arrythmia        Medication List     STOP taking these medications  benztropine 0.5 MG tablet Commonly known as: COGENTIN   levothyroxine 50 MCG tablet Commonly known as: SYNTHROID   prazosin 2 MG capsule Commonly known as: MINIPRESS   ramelteon 8 MG tablet Commonly known as: ROZEREM   risperiDONE 2 MG tablet Commonly known as: RISPERDAL   senna 8.6 MG Tabs tablet Commonly known as: SENOKOT   valproic acid 250 MG/5ML solution Commonly known as: DEPAKENE       TAKE these medications      Indication  docusate sodium 100 MG capsule Commonly known as: COLACE Take 1 capsule (100 mg total) by mouth 2 (two) times daily.  Indication: Constipation   gabapentin 100 MG capsule Commonly known as: NEURONTIN Take 1 capsule (100 mg total) by mouth 3 (three) times daily.  Indication: Neuropathic Pain   hydrOXYzine 25 MG tablet Commonly known as: ATARAX Take 1 tablet (25 mg total) by mouth every 6 (six) hours as needed for anxiety (and for sleep). What changed: reasons to take this  Indication: Feeling Anxious   paliperidone 156 MG/ML Susy injection Commonly known  as: INVEGA SUSTENNA Inject 1 mL (156 mg total) into the muscle every 28 (twenty-eight) days. Next dose due 11/16/2023. Start taking on: Nov 16, 2023 What changed: additional instructions  Indication: Schizophrenia   polyethylene glycol 17 g packet Commonly known as: MIRALAX / GLYCOLAX Take 17 g by mouth daily as needed for moderate constipation. What changed:  when to take this reasons to take this  Indication: Constipation   rivastigmine 1.5 MG capsule Commonly known as: EXELON Take 1 capsule (1.5 mg total) by mouth 2 (two) times daily.  Indication: Alzheimer's Disease   traZODone 150 MG tablet Commonly known as: DESYREL Take 1 tablet (150 mg total) by mouth at bedtime as needed for sleep.  Indication: Trouble Sleeping        Follow-up Information     [facility] Follow up.   Specialty: Behavioral Health Why: Referral made Contact information: [address] [place] [address] [phone]        Rush Farmer, NP. Call on 10/21/2023.   Specialty: Nurse Practitioner Why: Please continue with your provider for medication management and primary care services.  Please call on 10/21/23 at 9 am to schedule appointments, as we were unable to contact prior to your discharge. Contact information: [address] [place] [address] [phone]         [facility]. Go on 10/21/2023.   Why: Please continue with your provider for [facility] and therapy services.  Please go on 10/21/23 at 10:00 am. Contact information: [address] [place], [place], [place]                Follow-up recommendations:  Activity: as tolerated  Diet: heart healthy  Other: -Follow-up with your outpatient psychiatric provider -instructions on appointment date, time, and address (location) are provided to you in discharge paperwork.  -Take your psychiatric medications as prescribed at discharge - instructions are provided to you in the discharge  paperwork  -Follow-up with outpatient primary care doctor and other specialists -for management of chronic medical disease, including: Neurocognitive disorder  -Testing: Follow-up with outpatient provider for abnormal lab results: None  -Recommend abstinence from alcohol, tobacco, and other illicit drug use at discharge.   -If your psychiatric symptoms recur, worsen, or if you have side effects to your psychiatric medications, call your outpatient psychiatric provider, 911, [phone] or go to the nearest emergency department.  -If suicidal thoughts recur, call your  outpatient psychiatric provider, 911, [phone] or go to the nearest emergency department.   Meryl Dare, MD PGY-1 Psychiatry Resident 10/21/2023, 8:12 AM

## 2023-10-21 NOTE — [facility] Suicide Risk Assessment (Signed)
 Suicide Risk Assessment  Discharge Assessment    [facility] Discharge Suicide Risk Assessment   Principal Problem: Schizophrenia [facility]) Discharge Diagnoses: Principal Problem:   Schizophrenia (HCC)  Anthony Knox is a 63 y.o., male with a past psychiatric history significant for schizoaffective disorder, bipolar type who presents to the [facility] Involuntary from [facility] for evaluation and management of agitation in his group home and psychosis.   Hospital course: Patient presented after group home reported that patient was becoming agitated and on admission was found to be sprayed psychosis.  Patient was started on Risperdal which has been effective in the past as well as Depakene.  Risperdal was titrated up during hospitalization with some improvement in patient's psychosis but even with treatment patient had some baseline delusions and disorganized thoughts.  Of note patient also has some degree neurocognitive disorder as evident by his poor short-term memory.  Prior to discharge the group home visit patient and reported he was at baseline and was willing to have patient return to their facility.  By discharge patient been transition to [place] long-acting injectable and was discontinued off the Depakene due to lack of efficacy.   During the patient's hospitalization, patient had extensive initial psychiatric evaluation, and follow-up psychiatric evaluations every day.   Psychiatric diagnoses provided upon initial assessment:  Schizoaffective disorder, bipolar type   Patient's psychiatric medications were adjusted on admission:  -Start Risperdal 1 mg q12H for psychosis -Start Depakene 250 mg BID for mood stabilization   During the hospitalization, other adjustments were made to the patient's psychiatric medication regimen:  Depakene was discontinued Risperdal was titrated up and patient was transitioned to [place] Started on rivastigmine for neurocognitive disorder Patient received  loading dose of Invega 456 mg and 156 mg (4/7) and will receive next maintenance dose on 11/16/2023  Total Time spent with patient: 1 hour  Musculoskeletal: Strength & Muscle Tone: within normal limits Gait & Station: normal Patient leans: N/A  Psychiatric Specialty Exam  Presentation  General Appearance:  Fairly Groomed  Eye Contact: Good  Speech: Normal Rate  Speech Volume: Normal  Handedness: Right   Mood and Affect  Mood: Euthymic  Duration of Depression Symptoms: No data recorded Affect: Appropriate; Congruent; Full Range   Thought Process  Thought Processes: Disorganized; Linear (Baseline per group home)  Descriptions of Associations:Loose  Orientation:Full (Time, Place and Person)  Thought Content:Delusions (Baseline per group home)  History of Schizophrenia/Schizoaffective disorder:Yes  Duration of Psychotic Symptoms:Greater than six months  Hallucinations:Hallucinations: None (Not responding to internal stimuli.)  Ideas of Reference:Delusions (Baseline per group home.)  Suicidal Thoughts:Suicidal Thoughts: No  Homicidal Thoughts:Homicidal Thoughts: No   Sensorium  Memory: Recent Poor; Immediate Poor; Remote Fair  Judgment: Poor  Insight: Poor   Executive Functions  Concentration: Fair  Attention Span: Fair  Recall: Poor  Fund of Knowledge: Good  Language: Good   Psychomotor Activity  Psychomotor Activity: Psychomotor Activity: Normal (No EPS.)   Assets  Assets: Housing   Sleep  Sleep: Sleep: Good Number of Hours of Sleep: 8.25   Physical Exam: Physical Exam Vitals and nursing note reviewed.  HENT:     Head: Normocephalic and atraumatic.  Pulmonary:     Effort: Pulmonary effort is normal.  Neurological:     General: No focal deficit present.     Mental Status: He is alert.  Psychiatric:     Comments: No obvious EPS.    Review of Systems  Constitutional:  Negative for fever.  Cardiovascular:  Negative for chest pain and palpitations.  Gastrointestinal:  Negative for constipation, diarrhea, nausea and vomiting.  Neurological:  Negative for dizziness, weakness and headaches.  Psychiatric/Behavioral:         Pt denies extrapyramidal symptoms including dystonia (sudden spastic contractions of muscle groups), parkinsonism (bradykinesia, tremors, rigidity), and akathisia (severe restlessness).    Blood pressure 127/81, pulse 61, temperature 97.8 F (36.6 C), temperature source Oral, resp. rate 16, height 5\' 6"  (1.676 m), weight 66.2 kg, SpO2 100%. Body mass index is 23.57 kg/m.  Mental Status Per Nursing Assessment::   On Admission:  Suicidal ideation indicated by patient  Demographic Factors:  Male and Caucasian  Loss Factors: NA  Historical Factors: NA  Risk Reduction Factors:   Living with another person, especially a relative  Continued Clinical Symptoms:  Baseline delusions.   Cognitive Features That Contribute To Risk:  None    Suicide Risk:  Mild:  There are no identifiable suicide plans, no associated intent, mild dysphoria and related symptoms, good self-control (both objective and subjective assessment), few other risk factors, and identifiable protective factors, including available and accessible social support.    Follow-up Information     [facility] Follow up.   Specialty: Behavioral Health Why: Referral made Contact information: [address] [place] [address] [phone]        Rush Farmer, NP. Call on 10/21/2023.   Specialty: Nurse Practitioner Why: Please continue with your provider for medication management and primary care services.  Please call on 10/21/23 at 9 am to schedule appointments, as we were unable to contact prior to your discharge. Contact information: [address] [place] [address] [phone]         [facility] Go on 10/21/2023.   Why: Please continue with your  provider for [facility] and therapy services.  Please go on 10/21/23 at 10:00 am. Contact information: [address] [place], [place], [place]                Plan Of Care/Follow-up recommendations:  Activity: as tolerated   Diet: heart healthy   Other: -Follow-up with your outpatient psychiatric provider -instructions on appointment date, time, and address (location) are provided to you in discharge paperwork.   -Take your psychiatric medications as prescribed at discharge - instructions are provided to you in the discharge paperwork   -Follow-up with outpatient primary care doctor and other specialists -for management of chronic medical disease, including: Neurocognitive disorder   -Testing: Follow-up with outpatient provider for abnormal lab results: None   -Recommend abstinence from alcohol, tobacco, and other illicit drug use at discharge.    -If your psychiatric symptoms recur, worsen, or if you have side effects to your psychiatric medications, call your outpatient psychiatric provider, 911, [phone] or go to the nearest emergency department.   -If suicidal thoughts recur, call your outpatient psychiatric provider, 911, [phone] or go to the nearest emergency department.  Meryl Dare, MD PGY-1 Psychiatry Resident 10/21/2023, 8:13 AM

## 2023-10-21 NOTE — Progress Notes (Addendum)
 D: Pt A & O X 3. Denies SI, HI, AVH and pain at this time. D/C home as ordered. Picked up in lobby by his group home staff.  A: D/C instructions reviewed with group home representative and pt including prescriptions and follow up appointment; compliance encouraged. All belongings from locker 12 returned to pt at time of departure. Scheduled medications given with verbal education and effects monitored. Safety checks maintained without incident till time of d/c.  R: Pt receptive to care. Compliant with medications when offered. Denies adverse drug reactions when assessed. Verbalized understanding related to d/c instructions. Signed belonging sheet in agreement with items received from locker. Ambulatory with a steady gait. Appears to be in no physical distress at time of departure.

## 2023-10-21 NOTE — Progress Notes (Signed)
  [facility] Adult Case Management Discharge Plan :  Will you be returning to the same living situation after discharge:  Yes,  patient will return to [facility] (group home) At discharge, do you have transportation home?: Yes,  Barbie Banner from the group home will pick him up at 11:00 AM Do you have the ability to pay for your medications: Yes,  patient has insurance  Release of information consent forms completed and in the chart;  Patient's signature needed at discharge.  Patient to Follow up at:  Follow-up Information     Rush Farmer, NP. Call on 10/21/2023.   Specialty: Nurse Practitioner Why: Please continue with your provider for medication management and primary care services.  Please call on 10/22/23 at 9 am to schedule appointments, as we were unable to contact prior to your discharge. Contact information: [address] [place] [address] [phone]         [facility]. Go on 10/21/2023.   Why: Please continue with your provider for [facility] and therapy services.  Please go on 10/21/23 at 10:00 am. Contact information: [address] [place], [place], [place]                Next level of care provider has access to [facility] Link:no  Safety Planning and Suicide Prevention discussed: Yes,  Cliffton Asters. [place], [phone]  Has patient been referred to the Quitline?: Patient does not use tobacco/nicotine products.  Previously used chewable tobacco but patient reported that he no longer uses.  Patient has been referred for addiction treatment: No known substance use disorder.   Kayli Beal O Letti Towell, LCSWA 10/21/2023, 9:38 AM

## 2023-10-21 NOTE — Care Management Important Message (Signed)
 Patient informed of right to appeal discharge, provided phone number to [facility]. Patient expressed no interest in appealing discharge at this time. CSW will continue to monitor situation.   Lee-Ann Gal, LCSWA 10/20/2023

## 2023-10-21 NOTE — [facility]HH Group Notes (Signed)
 Adult Psychoeducational Group Note  Date:  10/21/2023 Time:  9:44 AM  Group Topic/Focus:  Goals Group:   The focus of this group is to help patients establish daily goals to achieve during treatment and discuss how the patient can incorporate goal setting into their daily lives to aide in recovery. Orientation:   The focus of this group is to educate the patient on the purpose and policies of crisis stabilization and provide a format to answer questions about their admission.  The group details unit policies and expectations of patients while admitted.  Participation Level:  Active  Participation Quality:  Appropriate  Affect:  Appropriate  Cognitive:  Appropriate  Insight: Appropriate  Engagement in Group:  Engaged  Modes of Intervention:  Discussion  Additional Comments:  Pt attended the goals group and remained appropriate and engaged throughout the duration of the group.   Sheran Lawless 10/21/2023, 9:44 AM

## 2024-03-07 ENCOUNTER — Ambulatory Visit (INDEPENDENT_AMBULATORY_CARE_PROVIDER_SITE_OTHER): Admitting: Podiatry

## 2024-03-07 DIAGNOSIS — Z91199 Patient's noncompliance with other medical treatment and regimen due to unspecified reason: Secondary | ICD-10-CM

## 2024-03-07 NOTE — Progress Notes (Signed)
 1. No-show for appointment

## 2024-05-16 ENCOUNTER — Ambulatory Visit (INDEPENDENT_AMBULATORY_CARE_PROVIDER_SITE_OTHER): Admitting: Podiatry

## 2024-05-16 DIAGNOSIS — Z91199 Patient's noncompliance with other medical treatment and regimen due to unspecified reason: Secondary | ICD-10-CM

## 2024-05-16 NOTE — Progress Notes (Signed)
 1. No-show for appointment

## 2024-05-26 ENCOUNTER — Ambulatory Visit (INDEPENDENT_AMBULATORY_CARE_PROVIDER_SITE_OTHER): Admitting: Podiatry

## 2024-05-26 DIAGNOSIS — Z91198 Patient's noncompliance with other medical treatment and regimen for other reason: Secondary | ICD-10-CM

## 2024-05-26 NOTE — Progress Notes (Signed)
 1. Failure to attend appointment with reason given    Rescheduled by patient.

## 2024-06-20 ENCOUNTER — Ambulatory Visit: Admitting: Podiatry

## 2024-06-20 ENCOUNTER — Encounter: Payer: Self-pay | Admitting: Podiatry

## 2024-06-20 DIAGNOSIS — B351 Tinea unguium: Secondary | ICD-10-CM | POA: Diagnosis not present

## 2024-06-20 DIAGNOSIS — M2012 Hallux valgus (acquired), left foot: Secondary | ICD-10-CM | POA: Diagnosis not present

## 2024-06-20 DIAGNOSIS — M2011 Hallux valgus (acquired), right foot: Secondary | ICD-10-CM | POA: Diagnosis not present

## 2024-06-20 DIAGNOSIS — M79674 Pain in right toe(s): Secondary | ICD-10-CM | POA: Diagnosis not present

## 2024-06-20 DIAGNOSIS — M79675 Pain in left toe(s): Secondary | ICD-10-CM | POA: Diagnosis not present

## 2024-06-23 DIAGNOSIS — B351 Tinea unguium: Secondary | ICD-10-CM | POA: Insufficient documentation

## 2024-06-23 DIAGNOSIS — M2011 Hallux valgus (acquired), right foot: Secondary | ICD-10-CM | POA: Insufficient documentation

## 2024-06-23 NOTE — Progress Notes (Signed)
 Subjective: Anthony Knox presents today referred by Anthony Lovena Mason, Anthony Knox for complaint of with chief concern of elongated, thickened, painful, discolored toenails for months. Patient has tried none..   Past Medical History:  Diagnosis Date   Schizophrenia [facility])      Patient Active Problem List   Diagnosis Date Noted   Pain due to onychomycosis of toenails of both feet 06/23/2024   Hallux valgus, acquired, bilateral 06/23/2024   Schizophrenia (HCC) 10/03/2023   Evaluation by psychiatric service required 11/29/2021   Eating problem 11/29/2021   Schizoaffective disorder (HCC) 12/13/2020   Constipation    Prediabetes    Syncope    Atrial fibrillation (HCC)    Subclinical hypothyroidism    Atrial fibrillation with RVR (HCC) 12/06/2020   MDD (major depressive disorder), recurrent episode, severe (HCC) 06/18/2018   Schizoaffective disorder, bipolar type (HCC) 05/07/2018   Psychosis (HCC) 05/06/2018   Disorientation    Scalp laceration    ANKLE PAIN 11/13/2008     History reviewed. No pertinent surgical history.   Medications Ordered Prior to Encounter[1]   Allergies[2]   Social History   Occupational History   Not on file  Tobacco Use   Smoking status: Never   Smokeless tobacco: Never  Vaping Use   Vaping status: Never Used  Substance and Sexual Activity   Alcohol use: Not Currently   Drug use: Never   Sexual activity: Not Currently     History reviewed. No pertinent family history.   Immunization History  Administered Date(s) Administered   Influenza,inj,Quad PF,6+ Mos 06/19/2018   Pneumococcal Polysaccharide-23 06/19/2018   Tdap 05/05/2018, 09/29/2023     Objective: There were no vitals filed for this visit.  Anthony Knox is a pleasant 63 y.o. male WD, WN in NAD. AAO x 3.  Vascular Examination: Vascular status intact b/l with palpable pedal pulses. Pedal hair present b/l. CFT immediate b/l. No edema. No pain with calf compression b/l. Skin  temperature gradient WNL b/l.   Neurological Examination: Sensation grossly intact b/l with 10 gram monofilament. Vibratory sensation intact b/l.   Dermatological Examination: Pedal skin with normal turgor, texture and tone b/l. Toenails 1-5 b/l thick, discolored, elongated with subungual debris and pain on dorsal palpation. No hyperkeratotic lesions noted b/l.   Musculoskeletal Examination: Muscle strength 5/5 to b/l LE. HAV with bunion deformity noted b/l LE.  Radiographs: None  Last A1c:      Latest Ref Rng & Units 10/05/2023    6:19 AM  Hemoglobin A1C  Hemoglobin-A1c 4.8 - 5.6 % 5.6    Assessment: 1. Pain due to onychomycosis of toenails of both feet   2. Hallux valgus, acquired, bilateral     Plan: -Patient was evaluated today. All questions/concerns addressed on today's visit. -Patient to continue soft, supportive shoe gear daily. -Mycotic toenails 1-5 bilaterally were debrided in length and girth with sterile nail nippers and dremel without incident. -Patient/POA to call should there be question/concern in the interim.  Return if symptoms worsen or fail to improve.  Anthony Knox, DPM      [facility] [address], [place] [address]  Office [phone]   [facility] LOCATION: [address] [place], [place] [address] Office [phone]    [1]  Current Outpatient Medications on File Prior to Visit  Medication Sig Dispense Refill   docusate sodium  (COLACE) 100 MG capsule Take 1 capsule (100 mg total) by mouth 2 (two) times daily. 60 capsule 0   gabapentin  (NEURONTIN ) 100 MG capsule Take 1 capsule (100 mg total) by mouth 3 (three) times daily. 90 capsule 0   hydrOXYzine  (ATARAX ) 25 MG tablet Take 1 tablet (25 mg total) by mouth every 6 (six) hours as needed for anxiety (and for sleep). 45 tablet 0   paliperidone  (INVEGA  SUSTENNA) 156 MG/ML SUSY injection Inject 1 mL  (156 mg total) into the muscle every 28 (twenty-eight) days. Next dose due 11/16/2023. 1 mL 0   polyethylene glycol (MIRALAX  / GLYCOLAX ) 17 g packet Take 17 g by mouth daily as needed for moderate constipation. 10 each 0   rivastigmine  (EXELON ) 1.5 MG capsule Take 1 capsule (1.5 mg total) by mouth 2 (two) times daily. 60 capsule 0   traZODone  (DESYREL ) 150 MG tablet Take 1 tablet (150 mg total) by mouth at bedtime as needed for sleep. 30 tablet 0   [DISCONTINUED] divalproex  (DEPAKOTE  ER) 250 MG 24 hr tablet Take 5 tablets (1,250 mg total) by mouth at bedtime. For mood stabilization (Patient not taking: Reported on 01/31/2021) 150 tablet 0   No current facility-administered medications on file prior to visit.  [2]  Allergies Allergen Reactions   Geodon  [Ziprasidone  Hcl] Other (See Comments)    Arrythmia

## 2024-09-19 ENCOUNTER — Ambulatory Visit: Admitting: Podiatry
